# Patient Record
Sex: Female | Born: 1960
Health system: Southern US, Community
[De-identification: ages and names within clinical notes are randomized; demographics above are authoritative.]

## PROBLEM LIST (undated history)

## (undated) DIAGNOSIS — L509 Urticaria, unspecified: Secondary | ICD-10-CM

## (undated) DIAGNOSIS — R0789 Other chest pain: Secondary | ICD-10-CM

## (undated) DIAGNOSIS — K219 Gastro-esophageal reflux disease without esophagitis: Secondary | ICD-10-CM

## (undated) DIAGNOSIS — I10 Essential (primary) hypertension: Secondary | ICD-10-CM

## (undated) DIAGNOSIS — M069 Rheumatoid arthritis, unspecified: Secondary | ICD-10-CM

## (undated) DIAGNOSIS — Z9109 Other allergy status, other than to drugs and biological substances: Secondary | ICD-10-CM

## (undated) DIAGNOSIS — D539 Nutritional anemia, unspecified: Secondary | ICD-10-CM

## (undated) DIAGNOSIS — Z8679 Personal history of other diseases of the circulatory system: Secondary | ICD-10-CM

## (undated) DIAGNOSIS — J329 Chronic sinusitis, unspecified: Secondary | ICD-10-CM

## (undated) DIAGNOSIS — Z8619 Personal history of other infectious and parasitic diseases: Secondary | ICD-10-CM

## (undated) DIAGNOSIS — T7840XA Allergy, unspecified, initial encounter: Secondary | ICD-10-CM

## (undated) DIAGNOSIS — J45909 Unspecified asthma, uncomplicated: Secondary | ICD-10-CM

## (undated) DIAGNOSIS — M810 Age-related osteoporosis without current pathological fracture: Secondary | ICD-10-CM

## (undated) DIAGNOSIS — R7989 Other specified abnormal findings of blood chemistry: Secondary | ICD-10-CM

## (undated) HISTORY — DX: Unspecified asthma, uncomplicated: J45.909

## (undated) HISTORY — DX: Urticaria, unspecified: L50.9

## (undated) HISTORY — DX: Gastro-esophageal reflux disease without esophagitis: K21.9

## (undated) HISTORY — DX: Nutritional anemia, unspecified: D53.9

## (undated) HISTORY — DX: Age-related osteoporosis without current pathological fracture: M81.0

## (undated) HISTORY — DX: Personal history of other diseases of the circulatory system: Z86.79

## (undated) HISTORY — PX: NASAL SINUS SURGERY: SHX719

## (undated) HISTORY — DX: Other allergy status, other than to drugs and biological substances: Z91.09

## (undated) HISTORY — DX: Personal history of other infectious and parasitic diseases: Z86.19

## (undated) HISTORY — DX: Other chest pain: R07.89

## (undated) HISTORY — DX: Allergy, unspecified, initial encounter: T78.40XA

## (undated) HISTORY — DX: Other specified abnormal findings of blood chemistry: R79.89

## (undated) HISTORY — DX: Hypercalcemia: E83.52

---

## 2002-01-07 ENCOUNTER — Other Ambulatory Visit: Admission: RE | Admit: 2002-01-07 | Discharge: 2002-01-07 | Payer: Self-pay | Admitting: Internal Medicine

## 2004-05-03 ENCOUNTER — Ambulatory Visit: Payer: Self-pay | Admitting: Internal Medicine

## 2004-06-23 ENCOUNTER — Ambulatory Visit (HOSPITAL_COMMUNITY): Admission: RE | Admit: 2004-06-23 | Discharge: 2004-06-23 | Payer: Self-pay | Admitting: Family Medicine

## 2004-06-28 ENCOUNTER — Encounter: Admission: RE | Admit: 2004-06-28 | Discharge: 2004-06-28 | Payer: Self-pay | Admitting: Internal Medicine

## 2004-07-01 ENCOUNTER — Emergency Department (HOSPITAL_COMMUNITY): Admission: EM | Admit: 2004-07-01 | Discharge: 2004-07-01 | Payer: Self-pay | Admitting: Emergency Medicine

## 2004-07-02 ENCOUNTER — Ambulatory Visit: Payer: Self-pay | Admitting: Internal Medicine

## 2005-04-05 ENCOUNTER — Ambulatory Visit: Payer: Self-pay | Admitting: Internal Medicine

## 2005-05-03 ENCOUNTER — Emergency Department (HOSPITAL_COMMUNITY): Admission: EM | Admit: 2005-05-03 | Discharge: 2005-05-03 | Payer: Self-pay | Admitting: Family Medicine

## 2005-06-14 ENCOUNTER — Emergency Department (HOSPITAL_COMMUNITY): Admission: EM | Admit: 2005-06-14 | Discharge: 2005-06-14 | Payer: Self-pay | Admitting: Family Medicine

## 2005-08-09 ENCOUNTER — Ambulatory Visit: Payer: Self-pay | Admitting: Hospitalist

## 2005-08-09 ENCOUNTER — Other Ambulatory Visit: Admission: RE | Admit: 2005-08-09 | Discharge: 2005-08-09 | Payer: Self-pay | Admitting: Internal Medicine

## 2005-09-06 ENCOUNTER — Ambulatory Visit (HOSPITAL_COMMUNITY): Admission: RE | Admit: 2005-09-06 | Discharge: 2005-09-06 | Payer: Self-pay | Admitting: Internal Medicine

## 2005-09-06 ENCOUNTER — Ambulatory Visit: Payer: Self-pay | Admitting: Internal Medicine

## 2005-11-01 ENCOUNTER — Ambulatory Visit: Payer: Self-pay | Admitting: Internal Medicine

## 2005-11-02 ENCOUNTER — Ambulatory Visit (HOSPITAL_COMMUNITY): Admission: RE | Admit: 2005-11-02 | Discharge: 2005-11-02 | Payer: Self-pay | Admitting: Internal Medicine

## 2005-12-16 ENCOUNTER — Ambulatory Visit: Payer: Self-pay | Admitting: Internal Medicine

## 2005-12-22 ENCOUNTER — Encounter: Admission: RE | Admit: 2005-12-22 | Discharge: 2005-12-22 | Payer: Self-pay | Admitting: Internal Medicine

## 2006-03-01 DIAGNOSIS — Z8679 Personal history of other diseases of the circulatory system: Secondary | ICD-10-CM

## 2006-03-14 ENCOUNTER — Emergency Department (HOSPITAL_COMMUNITY): Admission: EM | Admit: 2006-03-14 | Discharge: 2006-03-14 | Payer: Self-pay | Admitting: Emergency Medicine

## 2006-04-03 ENCOUNTER — Ambulatory Visit: Payer: Self-pay | Admitting: Internal Medicine

## 2006-04-03 ENCOUNTER — Encounter (INDEPENDENT_AMBULATORY_CARE_PROVIDER_SITE_OTHER): Payer: Self-pay | Admitting: Internal Medicine

## 2006-04-03 LAB — CONVERTED CEMR LAB
Albumin: 4.3 g/dL (ref 3.5–5.2)
HCT: 32.4 % — ABNORMAL LOW (ref 36.0–46.0)
Leukocyte count, blood: 3.9 10*9/L — ABNORMAL LOW (ref 4.0–10.5)
MCHC: 31.5 g/dL (ref 30.0–36.0)
MCV: 71.8 fL — ABNORMAL LOW (ref 78.0–100.0)
Platelets: 234 10*3/uL (ref 150–400)
RDW: 15.6 % — ABNORMAL HIGH (ref 11.5–14.0)
Total Bilirubin: 0.2 mg/dL — ABNORMAL LOW (ref 0.3–1.2)
Total Protein: 7.5 g/dL (ref 6.0–8.3)

## 2006-06-23 DIAGNOSIS — M810 Age-related osteoporosis without current pathological fracture: Secondary | ICD-10-CM | POA: Insufficient documentation

## 2006-06-23 DIAGNOSIS — D649 Anemia, unspecified: Secondary | ICD-10-CM

## 2006-09-19 ENCOUNTER — Ambulatory Visit: Payer: Self-pay | Admitting: Hospitalist

## 2006-09-19 ENCOUNTER — Encounter (INDEPENDENT_AMBULATORY_CARE_PROVIDER_SITE_OTHER): Payer: Self-pay | Admitting: Internal Medicine

## 2006-09-19 DIAGNOSIS — M79609 Pain in unspecified limb: Secondary | ICD-10-CM

## 2006-09-22 ENCOUNTER — Encounter (INDEPENDENT_AMBULATORY_CARE_PROVIDER_SITE_OTHER): Payer: Self-pay | Admitting: Internal Medicine

## 2006-09-22 ENCOUNTER — Ambulatory Visit (HOSPITAL_COMMUNITY): Admission: RE | Admit: 2006-09-22 | Discharge: 2006-09-22 | Payer: Self-pay | Admitting: Internal Medicine

## 2006-09-28 ENCOUNTER — Telehealth: Payer: Self-pay | Admitting: *Deleted

## 2006-10-03 ENCOUNTER — Ambulatory Visit: Payer: Self-pay | Admitting: Hospitalist

## 2006-10-03 ENCOUNTER — Telehealth (INDEPENDENT_AMBULATORY_CARE_PROVIDER_SITE_OTHER): Payer: Self-pay | Admitting: *Deleted

## 2006-10-03 ENCOUNTER — Encounter (INDEPENDENT_AMBULATORY_CARE_PROVIDER_SITE_OTHER): Payer: Self-pay | Admitting: Internal Medicine

## 2006-10-03 LAB — CONVERTED CEMR LAB
Ferritin: 817 ng/mL — ABNORMAL HIGH (ref 10–291)
HDL: 64 mg/dL (ref >39)
Iron: 95 ug/dL (ref 42–145)
LDL Cholesterol: 93 mg/dL (ref 0–99)
Saturation Ratios: 32 % (ref 20–55)
TIBC: 294 ug/dL (ref 250–470)
UIBC: 199 ug/dL
Vitamin B-12: 387 pg/mL (ref 211–911)

## 2006-10-04 ENCOUNTER — Encounter (INDEPENDENT_AMBULATORY_CARE_PROVIDER_SITE_OTHER): Payer: Self-pay | Admitting: Internal Medicine

## 2006-10-09 ENCOUNTER — Telehealth (INDEPENDENT_AMBULATORY_CARE_PROVIDER_SITE_OTHER): Payer: Self-pay | Admitting: *Deleted

## 2006-10-16 ENCOUNTER — Ambulatory Visit: Payer: Self-pay | Admitting: Internal Medicine

## 2006-10-16 ENCOUNTER — Encounter (INDEPENDENT_AMBULATORY_CARE_PROVIDER_SITE_OTHER): Payer: Self-pay | Admitting: Internal Medicine

## 2006-12-01 ENCOUNTER — Ambulatory Visit: Payer: Self-pay | Admitting: Internal Medicine

## 2007-01-04 ENCOUNTER — Telehealth (INDEPENDENT_AMBULATORY_CARE_PROVIDER_SITE_OTHER): Payer: Self-pay | Admitting: *Deleted

## 2007-02-07 ENCOUNTER — Encounter (INDEPENDENT_AMBULATORY_CARE_PROVIDER_SITE_OTHER): Payer: Self-pay | Admitting: Internal Medicine

## 2007-05-17 ENCOUNTER — Ambulatory Visit: Payer: Self-pay | Admitting: Infectious Diseases

## 2007-05-17 DIAGNOSIS — M069 Rheumatoid arthritis, unspecified: Secondary | ICD-10-CM | POA: Insufficient documentation

## 2007-06-25 ENCOUNTER — Telehealth (INDEPENDENT_AMBULATORY_CARE_PROVIDER_SITE_OTHER): Payer: Self-pay | Admitting: Internal Medicine

## 2007-06-28 ENCOUNTER — Encounter (INDEPENDENT_AMBULATORY_CARE_PROVIDER_SITE_OTHER): Payer: Self-pay | Admitting: Internal Medicine

## 2007-06-28 ENCOUNTER — Ambulatory Visit: Payer: Self-pay | Admitting: Internal Medicine

## 2007-06-28 LAB — CONVERTED CEMR LAB
CO2: 23 meq/L (ref 19–32)
Calcium: 9.1 mg/dL (ref 8.4–10.5)
Glucose, Bld: 92 mg/dL (ref 70–99)
MCV: 70.1 fL — ABNORMAL LOW (ref 78.0–100.0)
Platelets: 221 10*3/uL (ref 150–400)
Potassium: 4.1 meq/L (ref 3.5–5.3)
RBC: 4.68 M/uL (ref 3.87–5.11)
Sodium: 139 meq/L (ref 135–145)
WBC: 4.6 10*3/uL (ref 4.0–10.5)

## 2008-02-05 ENCOUNTER — Telehealth (INDEPENDENT_AMBULATORY_CARE_PROVIDER_SITE_OTHER): Payer: Self-pay | Admitting: Internal Medicine

## 2008-04-10 ENCOUNTER — Ambulatory Visit: Payer: Self-pay | Admitting: Internal Medicine

## 2008-04-10 ENCOUNTER — Encounter (INDEPENDENT_AMBULATORY_CARE_PROVIDER_SITE_OTHER): Payer: Self-pay | Admitting: Internal Medicine

## 2008-04-11 LAB — CONVERTED CEMR LAB
BUN: 14 mg/dL (ref 6–23)
CO2: 25 meq/L (ref 19–32)
Calcium: 10 mg/dL (ref 8.4–10.5)
Chloride: 103 meq/L (ref 96–112)
Creatinine, Ser: 0.66 mg/dL (ref 0.40–1.20)
HCT: 32.6 % — ABNORMAL LOW (ref 36.0–46.0)
Hemoglobin: 10.3 g/dL — ABNORMAL LOW (ref 12.0–15.0)
MCV: 70.7 fL — ABNORMAL LOW (ref 78.0–100.0)
RDW: 15.1 % (ref 11.5–15.5)
WBC: 4 10*3/uL (ref 4.0–10.5)

## 2008-06-10 ENCOUNTER — Telehealth: Payer: Self-pay | Admitting: *Deleted

## 2008-06-13 ENCOUNTER — Telehealth: Payer: Self-pay | Admitting: *Deleted

## 2008-06-30 ENCOUNTER — Telehealth: Payer: Self-pay | Admitting: *Deleted

## 2008-07-11 ENCOUNTER — Telehealth (INDEPENDENT_AMBULATORY_CARE_PROVIDER_SITE_OTHER): Payer: Self-pay | Admitting: Internal Medicine

## 2008-07-21 ENCOUNTER — Telehealth: Payer: Self-pay | Admitting: *Deleted

## 2008-07-29 ENCOUNTER — Encounter (INDEPENDENT_AMBULATORY_CARE_PROVIDER_SITE_OTHER): Payer: Self-pay | Admitting: Internal Medicine

## 2008-08-06 ENCOUNTER — Encounter (INDEPENDENT_AMBULATORY_CARE_PROVIDER_SITE_OTHER): Payer: Self-pay | Admitting: Internal Medicine

## 2008-08-06 ENCOUNTER — Ambulatory Visit: Payer: Self-pay | Admitting: *Deleted

## 2008-08-26 ENCOUNTER — Telehealth (INDEPENDENT_AMBULATORY_CARE_PROVIDER_SITE_OTHER): Payer: Self-pay | Admitting: Internal Medicine

## 2008-09-27 ENCOUNTER — Encounter (INDEPENDENT_AMBULATORY_CARE_PROVIDER_SITE_OTHER): Payer: Self-pay | Admitting: Internal Medicine

## 2009-05-15 ENCOUNTER — Ambulatory Visit (HOSPITAL_COMMUNITY): Admission: RE | Admit: 2009-05-15 | Discharge: 2009-05-15 | Payer: Self-pay | Admitting: Infectious Diseases

## 2009-05-15 ENCOUNTER — Ambulatory Visit: Payer: Self-pay | Admitting: Infectious Diseases

## 2009-05-15 DIAGNOSIS — R05 Cough: Secondary | ICD-10-CM

## 2009-05-15 DIAGNOSIS — R059 Cough, unspecified: Secondary | ICD-10-CM | POA: Insufficient documentation

## 2009-09-09 ENCOUNTER — Ambulatory Visit: Payer: Self-pay | Admitting: Internal Medicine

## 2009-09-25 ENCOUNTER — Encounter: Admission: RE | Admit: 2009-09-25 | Discharge: 2009-09-25 | Payer: Self-pay | Admitting: Internal Medicine

## 2009-11-04 ENCOUNTER — Encounter: Payer: Self-pay | Admitting: Internal Medicine

## 2010-05-19 ENCOUNTER — Emergency Department (HOSPITAL_COMMUNITY)
Admission: EM | Admit: 2010-05-19 | Discharge: 2010-05-19 | Payer: Self-pay | Source: Home / Self Care | Admitting: Emergency Medicine

## 2010-05-21 ENCOUNTER — Ambulatory Visit: Payer: Self-pay | Admitting: Internal Medicine

## 2010-05-21 DIAGNOSIS — J984 Other disorders of lung: Secondary | ICD-10-CM

## 2010-05-22 ENCOUNTER — Emergency Department (HOSPITAL_COMMUNITY)
Admission: EM | Admit: 2010-05-22 | Discharge: 2010-05-22 | Payer: Self-pay | Source: Home / Self Care | Admitting: Emergency Medicine

## 2010-06-21 ENCOUNTER — Emergency Department (HOSPITAL_COMMUNITY)
Admission: EM | Admit: 2010-06-21 | Discharge: 2010-06-21 | Payer: Self-pay | Source: Home / Self Care | Admitting: Family Medicine

## 2010-06-23 ENCOUNTER — Emergency Department (HOSPITAL_COMMUNITY)
Admission: EM | Admit: 2010-06-23 | Discharge: 2010-06-23 | Payer: Self-pay | Source: Home / Self Care | Admitting: Emergency Medicine

## 2010-06-27 ENCOUNTER — Encounter: Payer: Self-pay | Admitting: Internal Medicine

## 2010-07-01 ENCOUNTER — Ambulatory Visit: Admission: RE | Admit: 2010-07-01 | Discharge: 2010-07-01 | Payer: Self-pay | Source: Home / Self Care

## 2010-07-01 DIAGNOSIS — J13 Pneumonia due to Streptococcus pneumoniae: Secondary | ICD-10-CM | POA: Insufficient documentation

## 2010-07-08 NOTE — Assessment & Plan Note (Signed)
Summary: ACUTE-URGENT CARE F/U PER [facility] AND DR, KELLER/SEE EMR([place])   Vital Signs:  Patient profile:   50 year old female Height:      61 inches (154.94 cm) Weight:      109.6 pounds (49.82 kg) BMI:     20.78 Temp:     97.1 degrees F (36.17 degrees C) oral Pulse rate:   113 / minute BP sitting:   125 / 87  (left arm)  Vitals Entered By: Stanton Kidney Ditzler RN (July 01, 2010 2:35 PM) Is Patient Diabetic? No Pain Assessment Patient in pain? no      Nutritional Status BMI of 19 -24 = normal Nutritional Status Detail appetite good  Have you ever been in a relationship where you felt threatened, hurt or afraid?denies   Does patient need assistance? Functional Status Self care Ambulation Normal Comments FU from Urgent Care - feeling better. Discuss sinus drainage.   Primary Care Provider:  Darnelle Maffucci MD   History of Present Illness: 50 y/o w with h/o post nasal drip, allerigic rhinits, RA comes for ER follow up Was seen in the ER on 06/23/10 and was found to have a CAP treated with avelox and clarithromycin.  She is doing fine nowe. No fever, CP, cough etc    Depression History:      The patient denies a depressed mood most of the day and a diminished interest in her usual daily activities.         Preventive Screening-Counseling & Management  Alcohol-Tobacco     Smoking Status: never  Caffeine-Diet-Exercise     Does Patient Exercise: yes     Type of exercise: dance with preschoolers     Times/week: 5  Current Medications (verified): 1)  Calcium Citrate-Vitamin D 250-100 Mg-Unit Tabs (Calcium-Vitamin D) .Marland Kitchen.. 1 Tablet Bid 2)  Boniva 150 Mg Tabs (Ibandronate Sodium) .... Take One Tablet A Month 3)  Folic Acid 1 Mg Tabs (Folic Acid) .... Take 1 Tablet By Mouth Once A Day 4)  Prednisone 2.5 Mg  Tabs (Prednisone) .... Take 1 Tablet By Mouth Once A Day 5)  Zantac 300 Mg Tabs (Ranitidine Hcl) .... Once Daily 6)  Fluticasone Propionate 50 Mcg/act Susp (Fluticasone  Propionate) .... 2 Spray in Each Nostril Once A Day 7)  Diclofenac Sodium 75 Mg Tbec (Diclofenac Sodium) .... Take 1 Tablet By Mouth Two Times A Day 8)  Loratadine 10 Mg Tabs (Loratadine) .... Take 1 Tablet By Mouth Once A Day 9)  Cheratussin Ac 100-10 Mg/40ml Syrp (Guaifenesin-Codeine) .... Take 1-2 Tsp Every 6 Hours As Needed For Cough.  Allergies (verified): No Known Drug Allergies  Review of Systems  The patient denies anorexia, fever, weight loss, weight gain, vision loss, decreased hearing, hoarseness, chest pain, syncope, dyspnea on exertion, peripheral edema, prolonged cough, headaches, hemoptysis, abdominal pain, melena, hematochezia, severe indigestion/heartburn, hematuria, incontinence, genital sores, muscle weakness, suspicious skin lesions, transient blindness, difficulty walking, depression, unusual weight change, abnormal bleeding, enlarged lymph nodes, angioedema, breast masses, and testicular masses.    Physical Exam  General:  Gen: VS reveiwed, Alert, well developed, nodistress ENT: mucous membranes pink & moist. No abnormal finds in ear and nose. CVC:S1 S2 , no murmurs, no abnormal heart sounds. Lungs: Clear to auscultation B/L. No wheezes, crackles or other abnormal sounds Abdomen: soft, non distended, no tender. Normal Bowel sounds EXT: no pitting edema, no engorged veins, Pulsations normal  Neuro:alert, oriented *3, cranial nerved 2-12 intact, strenght normal in all  extremities, senstations normal to  light touch.      Impression & Recommendations:  Problem # 1:  COUGH (ICD-786.2)  better wit flonase, will ask her to increase flonase to 2 sprays in each nostril/day will change ranitidine to ppi as she says she will be able to afford it Will refer ENT for managment of chronic cough and chronic sinusitis continue current managemnt  Orders: ENT Referral (ENT)  Problem # 2:  PNEUMONIA, COMMUNITY ACQUIRED, PNEUMOCOCCAL (ICD-481) resolve  The following  medications were removed from the medication list:    Avelox Abc Pack 400 Mg Tabs (Moxifloxacin hcl) .Marland Kitchen... Take 1 tablet by mouth once a day for 10 days  Complete Medication List: 1)  Calcium Citrate-vitamin D 250-100 Mg-unit Tabs (Calcium-vitamin d) .Marland Kitchen.. 1 tablet bid 2)  Boniva 150 Mg Tabs (Ibandronate sodium) .... Take one tablet a month 3)  Folic Acid 1 Mg Tabs (Folic acid) .... Take 1 tablet by mouth once a day 4)  Prednisone 2.5 Mg Tabs (Prednisone) .... Take 1 tablet by mouth once a day 5)  Zantac 300 Mg Tabs (Ranitidine hcl) .... Once daily 6)  Fluticasone Propionate 50 Mcg/act Susp (Fluticasone propionate) .... 2 spray in each nostril once a day 7)  Diclofenac Sodium 75 Mg Tbec (Diclofenac sodium) .... Take 1 tablet by mouth two times a day 8)  Loratadine 10 Mg Tabs (Loratadine) .... Take 1 tablet by mouth once a day 9)  Cheratussin Ac 100-10 Mg/43ml Syrp (Guaifenesin-codeine) .... Take 1-2 tsp every 6 hours as needed for cough. 10)  Pantoprazole Sodium 40 Mg Tbec (Pantoprazole sodium) .... Take 1 tablet by mouth once a day 11)  Ventolin Hfa 108 (90 Base) Mcg/act Aers (Albuterol sulfate) .Marland Kitchen.. 1-2 puffs as needed for shortness of breath  Other Orders: Pneumococcal Vaccine (81191) Admin 1st Vaccine (47829)  Patient Instructions: 1)  Please schedule a follow-up appointment in 2 months. Prescriptions: PANTOPRAZOLE SODIUM 40 MG TBEC (PANTOPRAZOLE SODIUM) Take 1 tablet by mouth once a day  #31 x 3   Entered and Authorized by:   Bethel Born MD   Signed by:   Bethel Born MD on 07/01/2010   Method used:   Electronically to        [facility] [address] (retail)       [phone]       [place], [place]  [address]       Ph: [phone]       Fax: [phone]   RxID:   [id number]    Orders Added: 1)  Est. Patient Level IV [95638] 2)  Pneumococcal Vaccine [90732] 3)  Admin 1st Vaccine [90471] 4)  ENT Referral [ENT]   Immunizations  Administered:  Pneumonia Vaccine:    Vaccine Type: Pneumovax    Site: right deltoid    Mfr: Merck    Dose: 0.5 ml    Route: IM    Given by: Stanton Kidney Ditzler RN    Exp. Date: 10/29/2011    Lot #: [id number]    VIS given: 05/11/09 version given July 01, 2010.   Immunizations Administered:  Pneumonia Vaccine:    Vaccine Type: Pneumovax    Site: right deltoid    Mfr: Merck    Dose: 0.5 ml    Route: IM    Given by: Stanton Kidney Ditzler RN    Exp. Date: 10/29/2011    Lot #: [id number]    VIS given: 05/11/09 version given July 01, 2010.   Prevention & Chronic  Care Immunizations   Influenza vaccine: Fluvax Non-MCR  (05/21/2010)    Tetanus booster: 05/15/2009: Td    Pneumococcal vaccine: Pneumovax  (07/01/2010)   Pneumococcal vaccine deferral: Deferred  (05/15/2009)  Colorectal Screening   Hemoccult: Not documented   Hemoccult action/deferral: Ordered  (05/15/2009)    Colonoscopy: Not documented   Colonoscopy action/deferral: GI referral  (05/15/2009)  Other Screening   Pap smear: Not documented   Pap smear action/deferral: Deferred  (05/21/2010)    Mammogram: ASSESSMENT: Negative - BI-RADS 1^MM DIGITAL SCREENING  (09/25/2009)   Mammogram action/deferral: Ordered  (05/15/2009)   Smoking status: never  (07/01/2010)  Lipids   Total Cholesterol: 173  (10/03/2006)   LDL: 93  (10/03/2006)   LDL Direct: Not documented   HDL: 64  (10/03/2006)   Triglycerides: 82  (10/03/2006)      Resource handout printed.   Nursing Instructions: Give Pneumovax today

## 2010-07-08 NOTE — Assessment & Plan Note (Signed)
Summary: EST-CK/FU/MEDS/CFB   Vital Signs:  Patient profile:   50 year old female Height:      61 inches (154.94 cm) Weight:      110.3 pounds (50.14 kg) BMI:     20.92 Temp:     97.9 degrees F (36.61 degrees C) oral Pulse rate:   99 / minute BP sitting:   130 / 87  (right arm)  Vitals Entered By: Cynda Familia Duncan Dull) (September 09, 2009 3:05 PM) CC: pt missed mammo and gi appts, would like for [place] to r/s these, medication refill Is Patient Diabetic? No Nutritional Status BMI of 19 -24 = normal  Have you ever been in a relationship where you felt threatened, hurt or afraid?No   Does patient need assistance? Functional Status Self care Ambulation Normal   Primary Care Provider:  Darnelle Maffucci MD  CC:  pt missed mammo and gi appts, would like for [place] to r/s these, and medication refill.  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   Her Rheumatologist is at [facility], which she last saw 09/2008. She has next appointment at [facility] 12/2009 to restart her Methotrexate which she has been off for >1year.  About her arthritis, she has recently restarted prednisone 2.5 mg once daily herself because the pain has been getting worse.She has also been taking increasing amount of tylenol and ibuprofen. She has not seen her rheumatologist sinc e 09/2008.  Patient denies fever, chills, CP, SOB, N,V,C,D. Otherwise doing well, and denies any other complaints.     Preventive Screening-Counseling & Management  Alcohol-Tobacco     Smoking Status: never  Current Medications (verified): 1)  Calcium Citrate-Vitamin D 250-100 Mg-Unit Tabs (Calcium-Vitamin D) .Marland Kitchen.. 1 Tablet Bid 2)  Boniva 150 Mg Tabs (Ibandronate Sodium) .... Take One Tablet A Month 3)  Folic Acid 1 Mg Tabs (Folic Acid) .... Take 1 Tablet By Mouth Once A Day 4)  Vicodin 5-500 Mg Tabs (Hydrocodone-Acetaminophen)  .... Take One Tab By Mouth Once Every 6 Hours As Needed For Pain 5)  Prednisone 2.5 Mg  Tabs (Prednisone) .... Take 1 Tablet By Mouth Once A Day 6)  Zantac 300 Mg Tabs (Ranitidine Hcl) .... Once Daily 7)  Doxycycline Hyclate 100 Mg Caps (Doxycycline Hyclate) .... Twice A Day For 7 Days 8)  Fluticasone Propionate 50 Mcg/act Susp (Fluticasone Propionate) .Marland Kitchen.. 1 Spray in Each Nostril Once A Day 9)  Diclofenac Sodium 75 Mg Tbec (Diclofenac Sodium) .... Take 1 Tablet By Mouth Two Times A Day 10)  Loratadine 10 Mg Tabs (Loratadine) .... Take 1 Tablet By Mouth Once A Day  Allergies (verified): No Known Drug Allergies  Review of Systems       .Per HPI  Physical Exam  General:  alert, well-developed, and cooperative to examination.    Lungs:  normal respiratory effort, no accessory muscle use, normal breath sounds, no crackles, and no wheezes.  Heart:  normal rate, regular rhythm, no murmur, no gallop, and no rub.    Abdomen:  soft, non-tender, normal bowel sounds, no distention, no guarding, no rebound tenderness, no hepatomegaly, and no splenomegaly.    Msk:  no joint swelling, no joint warmth, and no redness over joints.  Extremities:  No cyanosis, clubbing, edema  Neurologic:  alert & oriented X3, cranial nerves II-XII intact, strength normal in all extremities, sensation intact to light touch, and gait  normal.     Skin:   turgor normal and no rashes.  Psych:  Oriented X3, memory intact for recent and remote, normally interactive, good eye contact, not anxious appearing, and not depressed appearing.    Impression & Recommendations:  Problem # 1:  ARTHRITIS, RHEUMATOID (ICD-714.0) Assessment Unchanged since age 39, well controlled, on prednisone. she will see her rheumatologist at [facility] 12/2009, For now will give diclofenac for further relief of her RA (as it has a higher affinity to joints that other NSAIDS)   Problem # 2:  COUGH (ICD-786.2) 2/2 to post nasal drip, will give loratidine.     Problem # 3:  Preventive Health Care (ICD-V70.0) Will schedule for mamogram and gi for colonoscopy.   Complete Medication List: 1)  Calcium Citrate-vitamin D 250-100 Mg-unit Tabs (Calcium-vitamin d) .Marland Kitchen.. 1 tablet bid 2)  Boniva 150 Mg Tabs (Ibandronate sodium) .... Take one tablet a month 3)  Folic Acid 1 Mg Tabs (Folic acid) .... Take 1 tablet by mouth once a day 4)  Vicodin 5-500 Mg Tabs (Hydrocodone-acetaminophen) .... Take one tab by mouth once every 6 hours as needed for pain 5)  Prednisone 2.5 Mg Tabs (Prednisone) .... Take 1 tablet by mouth once a day 6)  Zantac 300 Mg Tabs (Ranitidine hcl) .... Once daily 7)  Doxycycline Hyclate 100 Mg Caps (Doxycycline hyclate) .... Twice a day for 7 days 8)  Fluticasone Propionate 50 Mcg/act Susp (Fluticasone propionate) .Marland Kitchen.. 1 spray in each nostril once a day 9)  Diclofenac Sodium 75 Mg Tbec (Diclofenac sodium) .... Take 1 tablet by mouth two times a day 10)  Loratadine 10 Mg Tabs (Loratadine) .... Take 1 tablet by mouth once a day  Patient Instructions: 1)  Please schedule a follow-up appointment in 6 months. Prescriptions: LORATADINE 10 MG TABS (LORATADINE) Take 1 tablet by mouth once a day  #30 x 3   Entered and Authorized by:   Darnelle Maffucci MD   Signed by:   Darnelle Maffucci MD on 09/09/2009   Method used:   Electronically to        [facility] [address] (retail)       [phone]       [place], [place]  [address]       Ph: [phone]       Fax: [phone]   RxID:   [id number] DICLOFENAC SODIUM 75 MG TBEC (DICLOFENAC SODIUM) Take 1 tablet by mouth two times a day  #60 x 3   Entered and Authorized by:   Darnelle Maffucci MD   Signed by:   Darnelle Maffucci MD on 09/09/2009   Method used:   Print then Give to Patient   RxID:   [id number] VICODIN 5-500 MG TABS (HYDROCODONE-ACETAMINOPHEN) Take one tab by mouth once every 6 hours as needed for pain  #15 x 0   Entered and Authorized by:    Darnelle Maffucci MD   Signed by:   Darnelle Maffucci MD on 09/09/2009   Method used:   Print then Give to Patient   RxID:   [id number] PREDNISONE 2.5 MG  TABS (PREDNISONE) Take 1 tablet by mouth once a day  #30 Tablet x 3   Entered and Authorized by:   Darnelle Maffucci MD   Signed by:   Darnelle Maffucci MD on 09/09/2009   Method used:   Electronically to        [facility] [address] (retail)  [address]       [place], [place]  [address]       Ph: [phone]       Fax: [phone]   RxID:   [id number] BONIVA 150 MG TABS (IBANDRONATE SODIUM) Take one tablet a month  #1 x 12   Entered and Authorized by:   Darnelle Maffucci MD   Signed by:   Darnelle Maffucci MD on 09/09/2009   Method used:   Electronically to        [facility] [address] (retail)       [phone]       [place], [place]  [address]       Ph: [phone]       Fax: [phone]   RxID:   [id number]    Prevention & Chronic Care Immunizations   Influenza vaccine: Fluvax Non-MCR  (05/15/2009)    Tetanus booster: 05/15/2009: Td    Pneumococcal vaccine: Not documented   Pneumococcal vaccine deferral: Deferred  (05/15/2009)  Colorectal Screening   Hemoccult: Not documented   Hemoccult action/deferral: Ordered  (05/15/2009)    Colonoscopy: Not documented   Colonoscopy action/deferral: GI referral  (05/15/2009)  Other Screening   Pap smear: Not documented   Pap smear action/deferral: Deferred  (05/15/2009)    Mammogram: Not documented   Mammogram action/deferral: Ordered  (05/15/2009)   Smoking status: never  (09/09/2009)  Lipids   Total Cholesterol: 173  (10/03/2006)   LDL: 93  (10/03/2006)   LDL Direct: Not documented   HDL: 64  (10/03/2006)   Triglycerides: 82  (10/03/2006)

## 2010-07-08 NOTE — Assessment & Plan Note (Signed)
Summary: ER/FU/ SB.   Vital Signs:  Patient profile:   50 year old female Height:      61 inches (154.94 cm) Weight:      115.01 pounds (52.28 kg) BMI:     21.81 Temp:     98.2 degrees F (36.78 degrees C) oral Pulse rate:   118 / minute BP sitting:   140 / 87  (right arm)  Vitals Entered By: Angelina Ok RN (May 21, 2010 1:32 PM) CC: Depression Is Patient Diabetic? No Pain Assessment Patient in pain? no      Nutritional Status BMI of 19 -24 = normal  Have you ever been in a relationship where you felt threatened, hurt or afraid?No   Does patient need assistance? Functional Status Self care Ambulation Normal Comments Follow up appointment.  Had Pneumonia.  ER visit.  Coughing up white phlegm.  Cough medication not working.  Problems sleepin due to cough.  Needs refill on Nasal spray.  On meds for Arthritis.bPain in left thigh.   Primary Care Provider:  Darnelle Maffucci MD  CC:  Depression.  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  Patient went to the ED 05/19/2010 where she was evaluated for a cough. Based on CURB 65 score, CXR and CT angio, patient's cough was secondary to CAP and she was discharged on Avelox for 10 days. She comes to clinic today for follow up. She states she is feeling better, but still has a cough which is keeping her up at night.   She denies chest pain, sore throat, new fevers, chills, abdominal complaints, changes in urinary and bowel habits.     Depression History:      The patient denies a depressed mood most of the day and a diminished interest in her usual daily activities.         Preventive Screening-Counseling & Management  Alcohol-Tobacco     Smoking Status: never  Current Medications (verified): 1)  Calcium Citrate-Vitamin D 250-100 Mg-Unit Tabs (Calcium-Vitamin D) .Marland Kitchen.. 1 Tablet Bid 2)   Boniva 150 Mg Tabs (Ibandronate Sodium) .... Take One Tablet A Month 3)  Folic Acid 1 Mg Tabs (Folic Acid) .... Take 1 Tablet By Mouth Once A Day 4)  Vicodin 5-500 Mg Tabs (Hydrocodone-Acetaminophen) .... Take One Tab By Mouth Once Every 6 Hours As Needed For Pain 5)  Prednisone 2.5 Mg  Tabs (Prednisone) .... Take 1 Tablet By Mouth Once A Day 6)  Zantac 300 Mg Tabs (Ranitidine Hcl) .... Once Daily 7)  Fluticasone Propionate 50 Mcg/act Susp (Fluticasone Propionate) .Marland Kitchen.. 1 Spray in Each Nostril Once A Day 8)  Diclofenac Sodium 75 Mg Tbec (Diclofenac Sodium) .... Take 1 Tablet By Mouth Two Times A Day 9)  Loratadine 10 Mg Tabs (Loratadine) .... Take 1 Tablet By Mouth Once A Day 10)  Cheratussin Ac 100-10 Mg/8ml Syrp (Guaifenesin-Codeine) .... Take 1-2 Tsp Every 6 Hours As Needed For Cough. 11)  Avelox Abc Pack 400 Mg Tabs (Moxifloxacin Hcl) .... 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  Risk Factors: Exercise: yes (05/15/2009)  Risk Factors: Smoking Status: never (05/21/2010)  Review of Systems      See HPI  Physical Exam  General:  alert and well-developed.  VS reviewed and wnl Head:  normocephalic and atraumatic.   Eyes:  vision grossly intact, pupils equal, pupils round, and pupils reactive to light.   Mouth:  pharynx pink and moist and no erythema.   Neck:  supple.   Lungs:  normal respiratory effort and normal breath sounds.   Heart:  normal rate  and regular rhythm.   Abdomen:  soft and non-tender.   Msk:  normal ROM and no joint tenderness.   Neurologic:  nonfocal    Impression & Recommendations:  Problem # 1:  COUGH (ICD-786.2) Assessment Improved Secondary to CAP. Pt has been on abx (avelox) for 1 day. No apparent signs of worsening PNA. Will advise patient to continue abx, and will give flu shot today, along with cheratussin for cough.  Problem # 2:  ARTHRITIS, RHEUMATOID (ICD-714.0) Assessment: Comment Only  On chronic prednisone therapy. Pt previously followed at [facility], but due to distance, she would like something closer. [facility] not candidate, since she had some no shows, and has an outstanding balance with them. Will refer to [facility].   Orders: Rheumatology Referral (Rheumatology)  Problem # 3:  LUNG NODULE (ICD-518.89) CT angio done 05/19/2010 revealed 7 mm nodule in right lung base, along with 5 mm nodule in right middle lobe. No prior CT to compare to. Based on consolidation, malignancy cannot be excluded, but the opacity that was found on CT was more likely related to PNA. Pt was advised to have a repeat study in 6 months for follow up.   Complete Medication List: 1)  Calcium Citrate-vitamin D 250-100 Mg-unit Tabs (Calcium-vitamin d) .Marland Kitchen.. 1 tablet bid 2)  Boniva 150 Mg Tabs (Ibandronate sodium) .... Take one tablet a month 3)  Folic Acid 1 Mg Tabs (Folic acid) .... Take 1 tablet by mouth once a day 4)  Vicodin 5-500 Mg Tabs (Hydrocodone-acetaminophen) .... Take one tab by mouth once every 6 hours as needed for pain 5)  Prednisone 2.5 Mg Tabs (Prednisone) .... Take 1 tablet by mouth once a day 6)  Zantac 300 Mg Tabs (Ranitidine hcl) .... Once daily 7)  Fluticasone Propionate 50 Mcg/act Susp (Fluticasone propionate) .Marland Kitchen.. 1 spray in each nostril once a day 8)  Diclofenac Sodium 75 Mg Tbec (Diclofenac sodium) .... Take 1 tablet by mouth two times a day 9)  Loratadine 10 Mg Tabs (Loratadine) .... Take 1 tablet  by mouth once a day 10)  Cheratussin Ac 100-10 Mg/74ml Syrp (Guaifenesin-codeine) .... Take 1-2 tsp every 6 hours as needed for cough. 11)  Avelox Abc Pack 400 Mg Tabs (Moxifloxacin hcl) .... Take 1 tablet by mouth once a day for 10 days  Patient Instructions: 1)  Please follow up with your primary care physician as needed. 2)  Please keep in mind, that you are due for a PAP smear. Please schedule one as soon as you can. 3)  Please follow up with Rheumatology as needed. A referral will be made for you.  Prescriptions: CHERATUSSIN AC 100-10 MG/5ML SYRP (GUAIFENESIN-CODEINE) Take 1-2 tsp every 6 hours as needed for cough.  #1 bottle x 0   Entered and Authorized by:   Melida Quitter MD   Signed  by:   Melida Quitter MD on 05/21/2010   Method used:   Print then Give to Patient   RxID:   [id number] VICODIN 5-500 MG TABS (HYDROCODONE-ACETAMINOPHEN) Take one tab by mouth once every 6 hours as needed for pain  #15 x 0   Entered and Authorized by:   Melida Quitter MD   Signed by:   Melida Quitter MD on 05/21/2010   Method used:   Print then Give to Patient   RxID:   [id number] LORATADINE 10 MG TABS (LORATADINE) Take 1 tablet by mouth once a day  #30 x 3   Entered and Authorized by:   Melida Quitter MD   Signed by:   Melida Quitter MD on 05/21/2010   Method used:   Electronically to        [facility] [address] (retail)       [phone]       [place], [place]  [address]       Ph: [phone]       Fax: [phone]   RxID:   [id number] DICLOFENAC SODIUM 75 MG TBEC (DICLOFENAC SODIUM) Take 1 tablet by mouth two times a day  #60 x 3   Entered and Authorized by:   Melida Quitter MD   Signed by:   Melida Quitter MD on 05/21/2010   Method used:   Electronically to        [facility] [address] (retail)       [phone]       [place], [place]  [address]       Ph: [phone]       Fax: [phone]   RxID:    [id number] FLUTICASONE PROPIONATE 50 MCG/ACT SUSP (FLUTICASONE PROPIONATE) 1 SPRAY IN EACH NOSTRIL ONCE A DAY  #1 x 6   Entered and Authorized by:   Melida Quitter MD   Signed by:   Melida Quitter MD on 05/21/2010   Method used:   Electronically to        [facility] [address] (retail)       [phone]       [place], [place]  [address]       Ph: [phone]       Fax: [phone]   RxID:   [id number] ZANTAC 300 MG TABS (RANITIDINE HCL) once daily  #30 x 6   Entered and Authorized by:   Melida Quitter MD   Signed by:   Melida Quitter MD on 05/21/2010   Method used:   Electronically to        [facility] [address] (retail)       [phone]       [place], [place]  [address]       Ph: [phone]       Fax: [phone]   RxID:   [id number] PREDNISONE 2.5 MG  TABS (PREDNISONE) Take 1 tablet by mouth once a day  #30 Tablet x 3   Entered and Authorized by:   Melida Quitter MD   Signed by:   Melida Quitter MD on 05/21/2010   Method used:   Electronically to        [facility] [address] (retail)       [phone]  [place], [place]  [address]       Ph: [phone]       Fax: [phone]   RxID:   [id number]    Orders Added: 1)  Rheumatology Referral [Rheumatology] 2)  Est. Patient Level III [[address]]     Vital Signs:  Patient profile:   50 year old female Height:      61 inches (154.94 cm) Weight:      115.01 pounds (52.28 kg) BMI:     21.81 Temp:     98.2 degrees F (36.78 degrees C) oral Pulse rate:   118 / minute BP sitting:   140 / 87  (right arm)  Vitals Entered By: Angelina Ok RN (May 21, 2010 1:32 PM)   Prevention & Chronic Care Immunizations   Influenza vaccine: Fluvax Non-MCR  (05/15/2009)    Tetanus booster: 05/15/2009: Td    Pneumococcal vaccine: Not documented   Pneumococcal vaccine deferral: Deferred   (05/15/2009)  Colorectal Screening   Hemoccult: Not documented   Hemoccult action/deferral: Ordered  (05/15/2009)    Colonoscopy: Not documented   Colonoscopy action/deferral: GI referral  (05/15/2009)  Other Screening   Pap smear: Not documented   Pap smear action/deferral: Deferred  (05/21/2010)    Mammogram: ASSESSMENT: Negative - BI-RADS 1^MM DIGITAL SCREENING  (09/25/2009)   Mammogram action/deferral: Ordered  (05/15/2009)   Smoking status: never  (05/21/2010)  Lipids   Total Cholesterol: 173  (10/03/2006)   LDL: 93  (10/03/2006)   LDL Direct: Not documented   HDL: 64  (10/03/2006)   Triglycerides: 82  (10/03/2006)   Nursing Instructions: Give Flu vaccine today     Appended Document: ER/FU/ SB.   Immunizations Administered:  Influenza Vaccine # 1:    Vaccine Type: Fluvax Non-MCR    Site: left deltoid    Mfr: GlaxoSmithKline    Dose: 0.5 ml    Route: IM    Given by: Angelina Ok RN    Exp. Date: 12/04/2010    Lot #: [id number]    VIS given: 12/29/09 version given May 21, 2010.  Flu Vaccine Consent Questions:    Do you have a history of severe allergic reactions to this vaccine? no    Any prior history of allergic reactions to egg and/or gelatin? no    Do you have a sensitivity to the preservative Thimersol? no    Do you have a past history of Guillan-Barre Syndrome? no    Do you currently have an acute febrile illness? no    Have you ever had a severe reaction to latex? no    Vaccine information given and explained to patient? yes    Are you currently pregnant? no

## 2010-07-08 NOTE — Letter (Signed)
Summary: [facility]  [facility]   Imported By: Margie Billet 11/05/2009 13:51:23  _____________________________________________________________________  External Attachment:    Type:   Image     Comment:   External Document

## 2010-08-03 ENCOUNTER — Emergency Department (HOSPITAL_COMMUNITY)
Admission: EM | Admit: 2010-08-03 | Discharge: 2010-08-03 | Disposition: A | Discharge status: Home / Self Care | Payer: BC Managed Care – PPO | Attending: Emergency Medicine | Admitting: Emergency Medicine

## 2010-08-03 ENCOUNTER — Inpatient Hospital Stay (INDEPENDENT_AMBULATORY_CARE_PROVIDER_SITE_OTHER)
Admission: RE | Admit: 2010-08-03 | Discharge: 2010-08-03 | Disposition: A | Discharge status: Home / Self Care | Payer: BC Managed Care – PPO | Source: Ambulatory Visit | Attending: Emergency Medicine | Admitting: Emergency Medicine

## 2010-08-03 ENCOUNTER — Telehealth: Payer: Self-pay | Admitting: Internal Medicine

## 2010-08-03 ENCOUNTER — Ambulatory Visit (INDEPENDENT_AMBULATORY_CARE_PROVIDER_SITE_OTHER): Payer: BC Managed Care – PPO

## 2010-08-03 DIAGNOSIS — R059 Cough, unspecified: Secondary | ICD-10-CM | POA: Insufficient documentation

## 2010-08-03 DIAGNOSIS — Z79899 Other long term (current) drug therapy: Secondary | ICD-10-CM | POA: Insufficient documentation

## 2010-08-03 DIAGNOSIS — M069 Rheumatoid arthritis, unspecified: Secondary | ICD-10-CM | POA: Insufficient documentation

## 2010-08-03 DIAGNOSIS — J189 Pneumonia, unspecified organism: Secondary | ICD-10-CM

## 2010-08-03 DIAGNOSIS — R05 Cough: Secondary | ICD-10-CM | POA: Insufficient documentation

## 2010-08-03 DIAGNOSIS — R9389 Abnormal findings on diagnostic imaging of other specified body structures: Secondary | ICD-10-CM | POA: Insufficient documentation

## 2010-08-03 LAB — BASIC METABOLIC PANEL
BUN: 6 mg/dL (ref 6–23)
Calcium: 9.8 mg/dL (ref 8.4–10.5)
Creatinine, Ser: 0.59 mg/dL (ref 0.4–1.2)
GFR calc non Af Amer: >60 mL/min (ref >60)
Glucose, Bld: 84 mg/dL (ref 70–99)
Potassium: 4.1 mEq/L (ref 3.5–5.1)

## 2010-08-03 LAB — DIFFERENTIAL
Basophils Relative: 1 % (ref 0–1)
Eosinophils Absolute: 0.2 10*3/uL (ref 0.0–0.7)
Eosinophils Relative: 4 % (ref 0–5)
Lymphs Abs: 0.7 10*3/uL (ref 0.7–4.0)
Monocytes Absolute: 0.5 10*3/uL (ref 0.1–1.0)
Neutro Abs: 3.4 10*3/uL (ref 1.7–7.7)

## 2010-08-03 LAB — CBC
HCT: 28.4 % — ABNORMAL LOW (ref 36.0–46.0)
MCH: 22.2 pg — ABNORMAL LOW (ref 26.0–34.0)
MCHC: 33.1 g/dL (ref 30.0–36.0)
MCV: 67 fL — ABNORMAL LOW (ref 78.0–100.0)
RDW: 15.7 % — ABNORMAL HIGH (ref 11.5–15.5)

## 2010-08-03 NOTE — Telephone Encounter (Signed)
Patient presented to the ED with complaints of cough and URI symptoms, patients CXR shows unresolved infiltrate from prior treated pneumonia a month ago. Last CT angio in 05/2009 in [place] shows finding consistent with malignancy?  ED MD would like [place] to work this up further to make sure patient does not have lung cancer. I informed the patient to the clinic on 2/29 at 9 am, and that she will be worked into the clinic if possible.

## 2010-08-04 ENCOUNTER — Ambulatory Visit (INDEPENDENT_AMBULATORY_CARE_PROVIDER_SITE_OTHER): Payer: BC Managed Care – PPO | Admitting: Internal Medicine

## 2010-08-04 VITALS — BP 120/79 | HR 91 | Temp 97.4°F | Ht 62.0 in | Wt 110.2 lb

## 2010-08-04 DIAGNOSIS — R9389 Abnormal findings on diagnostic imaging of other specified body structures: Secondary | ICD-10-CM

## 2010-08-04 DIAGNOSIS — R05 Cough: Secondary | ICD-10-CM

## 2010-08-04 DIAGNOSIS — R059 Cough, unspecified: Secondary | ICD-10-CM

## 2010-08-04 MED ORDER — HYDROCOD POLST-CHLORPHEN POLST 10-8 MG/5ML PO LQCR: 5.0000 mL | Freq: Two times a day (BID) | ORAL | Status: DC | PRN

## 2010-08-04 NOTE — Patient Instructions (Signed)
Your xray findings are a bit concerning and we at this point cannot give you a definitive diagnosis, so we will refer you to a pulmonologist, which is a lung specialist for more specialized evaluation. In the meantime, please let us know if you have any questions or concerns.

## 2010-08-04 NOTE — Progress Notes (Signed)
  Subjective:    Patient ID: Jamie Cox, female    DOB: [birth year], 50 y.o.   MRN: [id number]  HPI  Patient is a 50 year old female here on emergency room follow up due to concerning chest x-ray findings.  A chest  CT angiogram done in the emergency room on May 19, 2010 revealed prominent areas of airspace consolidation in the periphery of the right middle and lower lung lobes that was suspicious for multifocal pneumonia. However, this consolidation also had a mass- like appearance, therefore, an underlying mass could not be excluded and the recommendation was to treat the acute pneumonia in order to delineate whether or not there should be concern for a cancerous process. Other significant findings at the time was also a 7 mm nodule in the right lung base and a 5 mm nodule in the right middle lobe, mild lymphadenopathy at the azygoesophageal recess and subcarinal region as well as small right hilar lymph nodes.   Prior to the December 14 emergency room visit, the patient reports she'd been having a one month history of dry coughing and feeling generally weak, then went to the ED, and was diagnosed with multifocal PNA as noted above and discharged on antibiotics. Patient states that she did recover from the pneumonia after a 10 day course of Avelox however the cough remained persistent, with increased postnasal drip. However, she denied any fever, chills, shortness of breath, sweats or hemoptysis.  Then the patient went into the ED again on August 03, 2010 for evaluation of the persistent coughing, and a chest x-ray showed persistent right middle lobe and right lower lobe airspace disease as well as slight worsening of the right lower lobe. There was also concern for basilar interstitial lung disease.   Besides the persistent cough, she states to me today that she feels generally well and is currently in no acute distress and is only concerned about the radiographic findings as mentioned above.    Review of Systems  Constitutional: Negative for fever and chills.  Respiratory: Positive for cough. Negative for shortness of breath and wheezing.   Cardiovascular: Negative for chest pain and palpitations.  Gastrointestinal: Negative for nausea and vomiting.  Genitourinary: Negative for dysuria.  Neurological: Negative for weakness.       Objective:   Physical Exam  Constitutional: She is oriented to person, place, and time. She appears well-developed and well-nourished. No distress.  Cardiovascular: Normal rate, regular rhythm and normal heart sounds.  Exam reveals no gallop and no friction rub.   No murmur heard. Pulmonary/Chest: Effort normal and breath sounds normal. No respiratory distress. She has no wheezes. She has no rales.  Abdominal: Soft. Bowel sounds are normal. There is no tenderness.  Musculoskeletal: Normal range of motion.  Neurological: She is alert and oriented to person, place, and time.  Psychiatric: She has a normal mood and affect.          Assessment & Plan:

## 2010-08-04 NOTE — Assessment & Plan Note (Addendum)
Chest x-ray from August 03 2010 was concerning for persistent right middle lobe and right lower lobe airspace disease with slight improvement of the right middle lobe and slight worsening of the right lower lobe as compared to the chest radiograph of June 21, 2010,  as well as evidence of basilar interstitial lung disease.  Etiology of all these findings is unclear at this point,  Again it could represent persistent pneumonia vs TB?-> although unlikely in the absence of fever chills sweats hemoptysis or any other systemic symptoms. However, there is worsening of the right lower lobe airspace disease even after abx treatment, and at this point we feel that patient would benefit most from evaluation by a pulmonologist,  as they will better be able to determine whether the patient needs to be followed with repeat imaging versus biopsy.  I will make a pulmonology referral today and the patient will be contacted for an appointment date and time by the clinic.

## 2010-08-04 NOTE — Assessment & Plan Note (Signed)
The differential for the patient's persistent cough is very wide at this point ( especially in the setting of her recent radiographic findings).  However she does have postnasal drip which could explain her chronic cough.  For this will give a prescription for Tussionex for relief of symptoms.

## 2010-08-10 ENCOUNTER — Institutional Professional Consult (permissible substitution) (INDEPENDENT_AMBULATORY_CARE_PROVIDER_SITE_OTHER): Payer: BC Managed Care – PPO | Admitting: Internal Medicine

## 2010-08-10 ENCOUNTER — Encounter: Payer: Self-pay | Admitting: Internal Medicine

## 2010-08-10 DIAGNOSIS — M069 Rheumatoid arthritis, unspecified: Secondary | ICD-10-CM

## 2010-08-13 ENCOUNTER — Telehealth (INDEPENDENT_AMBULATORY_CARE_PROVIDER_SITE_OTHER): Payer: Self-pay | Admitting: *Deleted

## 2010-08-16 LAB — POCT CARDIAC MARKERS
Myoglobin, poc: 121 ng/mL (ref 12–200)
Troponin i, poc: <0.05 ng/mL (ref 0.00–0.09)

## 2010-08-16 LAB — DIFFERENTIAL
Eosinophils Absolute: 0 10*3/uL (ref 0.0–0.7)
Lymphs Abs: 1.2 10*3/uL (ref 0.7–4.0)
Neutro Abs: 8.4 10*3/uL — ABNORMAL HIGH (ref 1.7–7.7)
Neutrophils Relative %: 77 % (ref 43–77)

## 2010-08-16 LAB — BASIC METABOLIC PANEL
BUN: 10 mg/dL (ref 6–23)
Calcium: 10.1 mg/dL (ref 8.4–10.5)
Creatinine, Ser: 0.73 mg/dL (ref 0.4–1.2)
GFR calc non Af Amer: >60 mL/min (ref >60)
Glucose, Bld: 112 mg/dL — ABNORMAL HIGH (ref 70–99)
Potassium: 3.9 mEq/L (ref 3.5–5.1)

## 2010-08-16 LAB — CBC
HCT: 30 % — ABNORMAL LOW (ref 36.0–46.0)
MCHC: 31.3 g/dL (ref 30.0–36.0)
Platelets: 185 10*3/uL (ref 150–400)
RDW: 14.9 % (ref 11.5–15.5)
WBC: 9.7 10*3/uL (ref 4.0–10.5)

## 2010-08-16 LAB — D-DIMER, QUANTITATIVE: D-Dimer, Quant: 1.98 ug/mL-FEU — ABNORMAL HIGH (ref 0.00–0.48)

## 2010-08-17 NOTE — Letter (Signed)
Summary: [employer]  [address]   [place], [place] [address]   Phone: [phone]  Fax: [phone]    Today's Date: August 10, 2010  Name of Patient: Jamie Cox  The above named patient had a medical visit today at: 3:00 pm.  Please take this into consideration when reviewing the time away from work/school.    Special Instructions:  [ x ] None  [  ] To be off the remainder of today, returning to the normal work / school schedule tomorrow.  [  ] To be off until the next scheduled appointment on ______________________.  [  ] Other ________________________________________________________________ ________________________________________________________________________   Sincerely yours,     Sandrea Hughs, MD

## 2010-08-17 NOTE — Assessment & Plan Note (Signed)
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 Darnelle Maffucci Primary Provider/Referring Provider:  Darnelle Maffucci MD  CC:  Cough and abnormal CXR.Marland Kitchen  History of Present Illness: 53 yobf never smoker dx RA in her early 20's steroid dep x around 2000 but also mtx, plaquenil in past per Dr Coral Spikes and also seen [facility] and due to f/u at [facility].    August 10, 2010   1st pulmonary office eval cc for cough starting around 2006 but much worse x 4 months with constant need to clear the throat,  mucus is clear,  cough so hard looses breath.  Much worse then ended in ER with dx of pna with cp on R rx as outpt with abx  and cp now gone but still coughing.   arthritis worse x 4 months and plans to go establish at [facility].    Pt denies any significant sore throat, dysphagia, itching, sneezing,  nasal congestion or excess secretions,  fever, chills, sweats, unintended wt loss, pleuritic or exertional cp, hempoptysis, variability in activity tolerance  orthopnea pnd or leg swelling. Pt also denies any obvious fluctuation in symptoms with weather or environmental change or other alleviating or aggravating factors.    Not much better with ventolin    Preventive Screening-Counseling & Management  Alcohol-Tobacco     Passive Smoke Exposure: yes  Current Medications (verified): 1)  Calcium Citrate-Vitamin D 250-100 Mg-Unit Tabs (Calcium-Vitamin D) .Marland Kitchen.. 1 Tablet Bid 2)  Boniva 150 Mg Tabs (Ibandronate Sodium) .... Take One Tablet A Month 3)  Folic Acid 1 Mg Tabs (Folic Acid) .... Take 1 Tablet By Mouth Once A Day 4)  Prednisone 2.5 Mg  Tabs (Prednisone) .... Take 1 Tablet By Mouth Once A Day 5)  Zantac 300 Mg Tabs (Ranitidine Hcl) .Marland Kitchen.. 1once Daily As Needed 6)  Fluticasone Propionate 50 Mcg/act Susp (Fluticasone Propionate) .... 2 Spray in Each Nostril Once A Day 7)  Diclofenac Sodium 75 Mg Tbec (Diclofenac Sodium) .... Take 1 Tablet By Mouth Two Times A  Day 8)  Loratadine 10 Mg Tabs (Loratadine) .... Take 1 Tablet By Mouth Once A Day 9)  Ventolin Hfa 108 (90 Base) Mcg/act Aers (Albuterol Sulfate) .Marland Kitchen.. 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 Never smoker. Positive history of passive tobacco smoke exposure.  Passive Smoke Exposure:  yes  Review of Systems       The patient complains of shortness of breath with activity, productive cough, loss of appetite, and headaches.  The patient denies shortness of breath at rest, non-productive cough, coughing up blood, chest pain, irregular heartbeats, acid heartburn, indigestion, weight change, abdominal pain, difficulty swallowing, sore throat, tooth/dental problems, nasal congestion/difficulty breathing through nose, sneezing, itching, ear ache, anxiety, depression, hand/feet swelling, joint stiffness or pain, rash, change in color of mucus, and fever.    Vital Signs:  Patient profile:   50 year old female Weight:      107 pounds O2 Sat:  100 % on Room air Temp:     97.7 degrees F oral Pulse rate:   90 / minute BP sitting:   118 / 80  (left arm)  Vitals Entered By: Vernie Murders (August 10, 2010 3:00 PM)  O2 Flow:  Room air  Physical Exam  Additional Exam:  wt 109 > 107 August 10, 2010  chronically ill bf nad HEENT: nl dentition, turbinates, and orophanx. Nl external ear canals without cough reflex NECK :  without JVD/Nodes/TM/  nl carotid upstrokes bilaterally LUNGS: no acc muscle use, clear to A and P bilaterally without cough on insp or exp maneuvers CV:  RRR  no s3 or murmur or increase in P2, no edema  . ABD:  soft and nontender with nl excursion in the supine position. 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  No further abx needed  Discussed in detail all the  indications, usual  risks and alternatives  relative to the benefits with patient who agrees to proceed with empiric rx with relatively low dose prednisone then regroup  The goal with a chronic steroid dependent illness is always arriving at the lowest effective dose that controls the disease/symptoms and not accepting a set "formula" which is based on statistics that don't take into accound individual variability or the natural hx of the dz in every individual patient, which may well vary over time. her min dose will be 2.5 mg      Problem # 2:  COUGH (ICD-786.2) The most common causes of chronic cough in immunocompetent adults include: upper airway cough syndrome (UACS), previously referred to as postnasal drip syndrome,  caused by variety of rhinosinus conditions; (2) asthma; (3) GERD; (4) chronic bronchitis from cigarette smoking or other inhaled environmental irritants; (5) nonasthmatic eosinophilic bronchitis; and (6) bronchiectasis. These conditions, singly or in combination, have accounted for up to 94% of the causes of chronic cough in prospective  studies.  Strongly favor upper airway cough syndrome for the cause of the chronic cough based on chronicity before the onset of the acute worsening with as dz   Classic Upper airway cough syndrome, so named because it's frequently impossible to sort out how much is  CR/sinusitis with freq throat clearing (which can be related to primary GERD)   vs  causing  secondary extra esophageal GERD from wide swings in gastric pressure that occur with throat clearing, promoting self use of mint and menthol lozenges that reduce the lower esophageal sphincter tone and exacerbate the problem further These are the same pts who not infrequently have failed to tolerate ace inhibitors,  dry powder inhalers or biphosphonates or report having reflux symptoms that don't respond to standard doses of PPI  Try off biphosphonates and rx gerd aggressively with diet and ppi then regroup when returns to f/u problem #1  Medications Added to Medication List This Visit: 1)  Zantac 300 Mg Tabs (Ranitidine hcl) .Marland Kitchen.. 1once daily as needed 2)  Zantac  300 Mg Tabs (Ranitidine hcl) .... One at bedtime 3)  Nexium 40 Mg Cpdr (Esomeprazole magnesium) .... By mouth daily. take one half hour before eating. 4)  Prednisone 10 Mg Tabs (Prednisone) .... 4 each am x 2days, 2x2days, 1x2days and stop  Other Orders: Consultation Level V (16109)  Patient Instructions: 1)  Prednsione 10 mg 4 each am x 2days, 2x2days, 1x2days and stop and resume prednisone 2.5 mg daily 2)  Stop boniva 3)  Start Nexium Take  one 30-60 min before first meal of the day and also Zantac 150 mg one at bedtime  4)  GERD (REFLUX)  is a common cause of respiratory symptoms. It commonly presents without heartburn and can be treated with medication, but also with lifestyle changes including avoidance of late meals, excessive alcohol, smoking cessation, and avoid fatty foods, chocolate, peppermint, colas, red wine, and acidic juices such as orange juice. NO MINT OR MENTHOL  PRODUCTS SO NO COUGH DROPS  5)  USE SUGARLESS CANDY INSTEAD (jolley ranchers)  6)  NO OIL BASED VITAMINS  7)  Please schedule a follow-up appointment in 2 weeks, sooner if needed with cxr Prescriptions: PREDNISONE 10 MG  TABS (PREDNISONE) 4 each am x 2days, 2x2days, 1x2days and stop  #14 x 0   Entered and Authorized by:   Nyoka Cowden MD   Signed by:   Nyoka Cowden MD on 08/10/2010   Method used:   [facility] to        [facility] [address] [phone]* (retail)       [address]       [place], [place]  [address]       Ph: [phone] or [phone]       Fax: [phone]   RxID:   [id number] NEXIUM 40 MG  CPDR (ESOMEPRAZOLE MAGNESIUM) By mouth daily. Take one half hour before eating.  #34 x 3   Entered and Authorized by:   Nyoka Cowden MD   Signed by:   Nyoka Cowden MD on 08/10/2010   Method used:   [facility]        [facility] [address] [phone]* (retail)       [address]       [place], [place]  [address]       Ph: [phone] or [phone]       Fax: [phone]   RxID:   [id number]

## 2010-08-17 NOTE — Progress Notes (Signed)
Summary: nexium too expensive > ok to use pantoprazole  Phone Note Call from Patient Call back at Home Phone [phone]   Caller: Patient Call For: wert Summary of Call: Patient phoned stated that she was given a prescription for Nexium but it is so expensive and she would like to know if there is anything less expensive that she can take. She uses [facility] [address]. She can be reached at [phone] Initial call taken by: Vedia Coffer,  August 13, 2010 4:07 PM  Follow-up for Phone Call        called and spoke with pt.  pt states she saw MW on 08-10-2010 and he prescribed Nexium.  Pt states this med is too expensive ($128) and she cannot afford this.  Pt is requesting an alternative.  Pt states she forgot to mention to MW she does have a bottle of Pantoprazole 40mg  at home and wanted to know if ok to take this med in place of the Nexium.  Please advise. (pt states she did pick up the zantac 150mg  tabs to take qhs. Thanks.  Aundra Millet Reynolds LPN  August 12, 4625 4:35 PM    Additional Follow-up for Phone Call Additional follow up Details #1::        yes, Pantoprazole is fine  Additional Follow-up by: Nyoka Cowden MD,  August 13, 2010 5:07 PM    Additional Follow-up for Phone Call Additional follow up Details #2::    I called the patient and had to leave a message that the new Rx has been sent to the pharmacy and she can go pick up at her pharmacy this evening and if any questions or concerns then call the office.Reynaldo Minium CMA  August 13, 2010 5:13 PM   RX sent.Reynaldo Minium CMA  August 13, 2010 5:15 PM   New/Updated Medications: PANTOPRAZOLE SODIUM 40 MG TBEC (PANTOPRAZOLE SODIUM) take 1 by mouth once daily Prescriptions: PANTOPRAZOLE SODIUM 40 MG TBEC (PANTOPRAZOLE SODIUM) take 1 by mouth once daily  #30 x 1   Entered by:   Reynaldo Minium CMA   Authorized by:   Nyoka Cowden MD   Signed by:   Reynaldo Minium CMA on 08/13/2010   Method used:   Electronically to        [facility] [address] (retail)       [phone]       [place], [place]  [address]       Ph: [phone]       Fax: [phone]   RxID:   [id number]

## 2010-08-25 ENCOUNTER — Encounter: Payer: Self-pay | Admitting: Internal Medicine

## 2010-08-30 ENCOUNTER — Ambulatory Visit: Payer: BC Managed Care – PPO | Admitting: Internal Medicine

## 2010-09-01 ENCOUNTER — Encounter: Payer: Self-pay | Admitting: Internal Medicine

## 2010-09-06 ENCOUNTER — Encounter: Payer: Self-pay | Admitting: Internal Medicine

## 2010-09-06 ENCOUNTER — Ambulatory Visit (INDEPENDENT_AMBULATORY_CARE_PROVIDER_SITE_OTHER): Payer: BC Managed Care – PPO | Admitting: Internal Medicine

## 2010-09-06 DIAGNOSIS — R9389 Abnormal findings on diagnostic imaging of other specified body structures: Secondary | ICD-10-CM

## 2010-09-06 DIAGNOSIS — E041 Nontoxic single thyroid nodule: Secondary | ICD-10-CM

## 2010-09-06 DIAGNOSIS — R053 Chronic cough: Secondary | ICD-10-CM | POA: Insufficient documentation

## 2010-09-06 DIAGNOSIS — M069 Rheumatoid arthritis, unspecified: Secondary | ICD-10-CM

## 2010-09-06 DIAGNOSIS — J984 Other disorders of lung: Secondary | ICD-10-CM

## 2010-09-06 DIAGNOSIS — D539 Nutritional anemia, unspecified: Secondary | ICD-10-CM

## 2010-09-06 DIAGNOSIS — R05 Cough: Secondary | ICD-10-CM

## 2010-09-06 MED ORDER — HYDROCODONE-ACETAMINOPHEN 5-500 MG PO TABS: 1.0000 | ORAL_TABLET | Freq: Four times a day (QID) | ORAL | Status: DC | PRN

## 2010-09-06 MED ORDER — BENZONATATE 200 MG PO CAPS: 200.0000 mg | ORAL_CAPSULE | Freq: Three times a day (TID) | ORAL | Status: DC | PRN

## 2010-09-06 NOTE — Assessment & Plan Note (Addendum)
We'll order a chest CT to followup.

## 2010-09-06 NOTE — Assessment & Plan Note (Signed)
We'll see her rheumatologist in 2 weeks.

## 2010-09-06 NOTE — Patient Instructions (Signed)
Cough, Generic There are many things that can cause a cough, and we are not certain what is causing your cough. Cough may be triggered by pollen, dust, animal dander, molds, certain foods, respiratory infections, exposure to smoke, exercise, or other things that cause allergic reactions or allergies (allergens). HOME CARE INSTRUCTIONS  Use prescription medications as ordered by your caregiver.   Avoid pollen, dust, animal dander, molds, smoke, or anything that seems to cause coughing at home or at work.   You may have fewer attacks if you decrease dust in your home. Electrostatic air cleaners may help.   It may help to replace or cover your pillows or mattress with materials less likely to cause allergies.   If you are not on fluid restriction, drink 8 to 10 glasses of water each day.   Discuss possible exercise routines with your caregiver.   If animal dander is the cause of your cough, you may need to get rid of pets.   A cold steam vaporizer may help your cough.   Avoid cold outside air if this causes coughing episodes.   If you had X-rays today or laboratory work done, make sure you know how to get your results if they were not given to you today. Leave a telephone number with your caregiver where you can be reached and make sure you are available to receive the call. Do not assume everything is normal if you have not heard from your caregiver or the medical facility. It is important for you to follow up on all of your test results.  SEEK MEDICAL CARE IF:  You develop wheezing and/or shortness of breath.   An oral temperature above 101 develops.   You have muscle aches, chest pain, or thickening of mucus or phlegm (sputum).   Your sputum changes from clear or white to yellow, green, gray, or bloody.   You have any problems that may be related to the medicine you are taking (such as a rash, itching, swelling, or trouble breathing).  SEEK IMMEDIATE MEDICAL CARE IF:  There is  increased coughing and/or shortness of breath.   You have increased difficulty breathing.   Any of the problems you had are getting worse.   You develop chest pain, nausea or vomiting or have pain in your chest that radiates into your arms, neck, jaw or teeth. CALL  (911 in [place] Do not attempt to drive yourself for medical care.  MAKE SURE YOU:   Understand these instructions.   Will watch your condition.   Will get help right away if you are not doing well or get worse.  Document Released: 01/01/2004 Document Re-Released: 08/17/2009 [facility] Patient Information 2011 [place], [place].

## 2010-09-06 NOTE — Progress Notes (Signed)
  Subjective:    Patient ID: Jamie Cox, female    DOB: 08-Jul-1960, 50 y.o.   MRN: [id number]  HPI  50 yo f,  August 10, 2010 she had her 1st pulmonary office eval cc for cough starting around 2006 but much worse x 4 months with constant need to clear the throat,  mucus is clear,  cough so hard looses breath.  Much worse then ended in ER with dx of pna with cp on R rx as outpt with abx  and cp now gone but still coughing.   arthritis worse x 4 months and plans to go establish at [facility].    Patient also had a pulmonary nodule which was concerning for malignancy, we will plan to followup. Patient today still complains of cough,  Despite initiation of Tussionex, proton pump inhibitor, and fluticasone by pulmonology to control postnasal drip and gastroesophageal reflux which can come to contribute to her cough.  Review of Systems  Constitutional: Negative for fever, chills, diaphoresis, appetite change, fatigue and unexpected weight change.  Respiratory: Positive for cough. Negative for chest tightness, shortness of breath and wheezing.   Cardiovascular: Negative for chest pain.  [all other systems reviewed and are negative       Objective:   Physical Exam  Constitutional: She is oriented to person, place, and time. She appears well-developed and well-nourished.  HENT:  Head: Normocephalic and atraumatic.  Eyes: EOM are normal. Pupils are equal, round, and reactive to light.  Cardiovascular: Normal rate, regular rhythm and normal heart sounds.   Pulmonary/Chest: Effort normal and breath sounds normal.  Abdominal: Soft. Bowel sounds are normal.  Musculoskeletal: Normal range of motion.  Neurological: She is alert and oriented to person, place, and time.  Skin: Skin is warm and dry.          Assessment & Plan:

## 2010-09-06 NOTE — Assessment & Plan Note (Signed)
Now being followed by pulmonology.

## 2010-09-06 NOTE — Assessment & Plan Note (Signed)
Likely secondary to anemia of chronic disease, we'll perform any at home next followup.

## 2010-09-06 NOTE — Progress Notes (Signed)
Pt aware she needs CXR - will come to [facility] - order faxed. Stanton Kidney Rick Carruthers RN 09/06/10 4:45PM

## 2010-09-07 NOTE — Progress Notes (Signed)
Addended by: Darnelle Maffucci on: 09/07/2010 09:34 AM   Modules accepted: Orders

## 2010-09-07 NOTE — Progress Notes (Signed)
Addended by: Darnelle Maffucci on: 09/07/2010 09:42 AM   Modules accepted: Orders

## 2010-09-08 NOTE — Assessment & Plan Note (Signed)
The most common causes of chronic cough in immunocompetent adults include:  (1) upper airway cough syndrome (UACS), previously referred to as postnasal drip syndrome,  caused by variety of rhinosinus conditions;  (2) asthma;  (3) GERD;  (4) chronic bronchitis from cigarette smoking or other inhaled environmental irritants;  (5) nonasthmatic eosinophilic bronchitis; and  (6) bronchiectasis.   These conditions, singly or in combination, have accounted for up to 94% of the causes of chronic cough in prospective studies. Strongly favor upper airway cough syndrome for the cause of the chronic cough based on chronicity before the onset of the acute worsening with as dz, Classic Upper airway cough syndrome, so named because it's frequently impossible to sort out how much is  CR/sinusitis with freq throat clearing (which can be related to primary GERD)   vs  causing  secondary extra esophageal GERD from wide swings in gastric pressure that occur with throat clearing, promoting self use of mint and menthol lozenges that reduce the lower esophageal sphincter tone and exacerbate the problem further These are the same pts who not infrequently have failed to tolerate ace inhibitors, dry powder inhalers or biphosphonates or report having reflux symptoms that don't respond to standard doses of PPI.  Given that the patient is afebrile, is not having any productive sputum we'll continue her PPI, fluticasone, Claritin and Tussionex. and add Tessalon. Will also schedule for CT scan of chest to follow up her pulmonary nodules.

## 2010-09-09 ENCOUNTER — Encounter: Payer: Self-pay | Admitting: Internal Medicine

## 2010-09-10 ENCOUNTER — Inpatient Hospital Stay (HOSPITAL_COMMUNITY): Admission: RE | Admit: 2010-09-10 | Payer: BC Managed Care – PPO | Source: Ambulatory Visit

## 2010-09-10 ENCOUNTER — Encounter (HOSPITAL_COMMUNITY): Payer: Self-pay

## 2010-09-10 ENCOUNTER — Ambulatory Visit (HOSPITAL_COMMUNITY)
Admission: RE | Admit: 2010-09-10 | Discharge: 2010-09-10 | Disposition: A | Discharge status: Home / Self Care | Payer: BC Managed Care – PPO | Source: Ambulatory Visit | Attending: Internal Medicine | Admitting: Internal Medicine

## 2010-09-10 DIAGNOSIS — R911 Solitary pulmonary nodule: Secondary | ICD-10-CM | POA: Insufficient documentation

## 2010-09-10 DIAGNOSIS — R599 Enlarged lymph nodes, unspecified: Secondary | ICD-10-CM | POA: Insufficient documentation

## 2010-09-10 DIAGNOSIS — J984 Other disorders of lung: Secondary | ICD-10-CM

## 2010-09-10 MED ORDER — IOHEXOL 300 MG/ML  SOLN: 80.0000 mL | Freq: Once | INTRAMUSCULAR | Status: AC | PRN

## 2010-09-13 NOTE — Progress Notes (Signed)
Addended by: Darnelle Maffucci on: 09/13/2010 10:03 AM   Modules accepted: Orders

## 2010-09-14 ENCOUNTER — Ambulatory Visit (INDEPENDENT_AMBULATORY_CARE_PROVIDER_SITE_OTHER): Payer: BC Managed Care – PPO | Admitting: Internal Medicine

## 2010-09-14 ENCOUNTER — Encounter: Payer: Self-pay | Admitting: Internal Medicine

## 2010-09-14 ENCOUNTER — Ambulatory Visit (INDEPENDENT_AMBULATORY_CARE_PROVIDER_SITE_OTHER)
Admission: RE | Admit: 2010-09-14 | Discharge: 2010-09-14 | Disposition: A | Discharge status: Home / Self Care | Payer: BC Managed Care – PPO | Source: Ambulatory Visit | Attending: Internal Medicine | Admitting: Internal Medicine

## 2010-09-14 VITALS — BP 118/80 | HR 119 | Temp 98.2°F | Ht 61.0 in | Wt 105.2 lb

## 2010-09-14 DIAGNOSIS — R05 Cough: Secondary | ICD-10-CM

## 2010-09-14 DIAGNOSIS — J984 Other disorders of lung: Secondary | ICD-10-CM

## 2010-09-14 DIAGNOSIS — R911 Solitary pulmonary nodule: Secondary | ICD-10-CM

## 2010-09-14 MED ORDER — PREDNISONE (PAK) 10 MG PO TABS: ORAL_TABLET | ORAL | Status: AC

## 2010-09-14 NOTE — Patient Instructions (Signed)
Please see patient coordinator before you leave today  to schedule sinus CT  Prednisone 10 mg take two daily until breathing and cough better then 10 mg daily  Add back the Zantac 300 mg at bedtime   Nexium or pantoprazole Take 30- 60 min before your first and last meals of the day   GERD (REFLUX)  is an extremely common cause of respiratory symptoms, many times with no significant heartburn at all.    It can be treated with medication, but also with lifestyle changes including avoidance of late meals, excessive alcohol, smoking cessation, and avoid fatty foods, chocolate, peppermint, colas, red wine, and acidic juices such as orange juice.  NO MINT OR MENTHOL PRODUCTS SO NO COUGH DROPS  USE SUGARLESS CANDY INSTEAD (jolley ranchers or Stover's)  NO OIL BASED VITAMINS   See Tammy NP w/in 3 weeks with all your medications, even over the counter meds, separated in two separate bags, the ones you take no matter what vs the ones you stop once you feel better and take only as needed when you feel you need them.   Tammy  will generate for you a new user friendly medication calendar that will put [place] all on the same page re: your medication use.     Without this process, it simply isn't possible to assure that we are providing  your outpatient care  with  the attention to detail we feel you deserve.   If we cannot assure that you're getting that kind of care,  then we cannot manage your problem effectively from this clinic.  Once you have seen Tammy and we are sure that we're all on the same page with your medication use she will arrange follow up with me with PFT's

## 2010-09-14 NOTE — Assessment & Plan Note (Signed)
Not clear whether this is related to ILD due to connective tissue dz or  Classic Upper airway cough syndrome, so named because it's frequently impossible to sort out how much is  CR/sinusitis with freq throat clearing (which can be related to primary GERD)   vs  causing  secondary (" extra esophageal")  GERD from wide swings in gastric pressure that occur with throat clearing, often  promoting self use of mint and menthol lozenges that reduce the lower esophageal sphincter tone and exacerbate the problem further in a cyclical fashion.   These are the same pts who not infrequently have failed to tolerate ace inhibitors,  dry powder inhalers or biphosphonates or report having reflux symptoms that don't respond to standard doses of PPI , and are easily confused as having aecopd or asthma flares, .  For now max gerd rx then use trust but verify approach with meds before adding more empiric trials    Each maintenance medication was reviewed in detail including most importantly the difference between maintenance and as needed and under what circumstances the prns are to be used.  Please see instructions for details which were reviewed in writing and the patient given a copy.   To keep things simple, I have asked the patient to first separate medicines that are perceived as maintenance, that is to be taken daily "no matter what", from those medicines that are taken on only on an as-needed basis and I have given the patient examples of both, and then return to see our NP to generate a  detailed  medication calendar which should be followed until the next physician sees the patient and updates it.

## 2010-09-14 NOTE — Progress Notes (Signed)
  Subjective:    Patient ID: Jamie Cox, female    DOB: [birth year], 50 y.o.   MRN: [id number]  HPI     Primary = [facility]/  Dr. Darnelle Maffucci      49 yobf never smoker dx RA in her early 20's steroid dep x around 2000 but also mtx, plaquenil in past per Dr Coral Spikes and also seen [facility] and due to f/u at [facility] with ? 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   August 10, 2010 1st pulmonary office eval cc for cough starting around 2006 but much worse x 4 months with constant need to clear the throat, mucus is clear, cough so hard looses breath. Much worse then ended in ER with dx of pna with cp on R rx as outpt with abx and cp now gone but still coughing. arthritis worse x 4 months and plans to go establish at [facility].  Imp was Boop plus/minus uacs rec 1) Prednsione 10 mg 4 each am x 2days, 2x2days, 1x2days and stop and resume prednisone 2.5 mg daily  2) Stop boniva  3) Start Nexium Take one 30-60 min before first meal of the day and also Zantac 150 mg one at bedtime  4) GERD (REFLUX) diet   09/14/2010 ov cc cough dry day = night,  pred now at 2.5 mg per day cough was better on the higher doses but plan is to start plaquenil this week per [facility].  Mostly dry. Pt denies any significant sore throat, dysphagia, itching, sneezing,  nasal congestion or excess/ purulent secretions,  fever, chills, sweats, unintended wt loss, pleuritic or exertional cp, hempoptysis, orthopnea pnd or leg swelling.    Also denies any obvious fluctuation of symptoms with weather or environmental changes or other aggravating or alleviating factors.    Thorougly confused with meds using large bag for all of them.    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  Never smoker.  Positive history of passive tobacco smoke exposure.  Passive Smoke Exposure: yes                 Review of Systems     Objective:   Physical Exam : wt 109 > 107 August 10, 2010  > 105 09/14/2010  chronically ill bf nad  HEENT: nl dentition, turbinates, and orophanx. Nl external ear canals without cough reflex  NECK : without JVD/Nodes/TM/ nl carotid upstrokes bilaterally  LUNGS: no acc muscle use, clear to A and P bilaterally without cough on insp or exp maneuvers  CV: RRR no s3 or murmur or increase in P2, no edema .  ABD: soft and nontender with nl excursion in the supine position. No bruits or organomegaly, bowel sounds nl  MS: warm with classic moderate RA changes hands no calf tenderness, cyanosis or clubbing  SKIN: warm and dry without lesions  NEURO: alert, approp, no deficits        Assessment & Plan:

## 2010-09-15 NOTE — Progress Notes (Signed)
Quick Note:  Spoke with pt and notified of results per Dr. Wert. Pt verbalized understanding and denied any questions.  ______ 

## 2010-09-16 ENCOUNTER — Ambulatory Visit (INDEPENDENT_AMBULATORY_CARE_PROVIDER_SITE_OTHER)
Admission: RE | Admit: 2010-09-16 | Discharge: 2010-09-16 | Disposition: A | Discharge status: Home / Self Care | Payer: BC Managed Care – PPO | Source: Ambulatory Visit | Attending: Internal Medicine | Admitting: Internal Medicine

## 2010-09-16 DIAGNOSIS — R05 Cough: Secondary | ICD-10-CM

## 2010-09-16 DIAGNOSIS — R059 Cough, unspecified: Secondary | ICD-10-CM

## 2010-09-17 ENCOUNTER — Other Ambulatory Visit: Payer: Self-pay | Admitting: Internal Medicine

## 2010-09-17 MED ORDER — AMOXICILLIN-POT CLAVULANATE 875-125 MG PO TABS: 1.0000 | ORAL_TABLET | Freq: Two times a day (BID) | ORAL | Status: AC

## 2010-09-17 NOTE — Progress Notes (Signed)
Quick Note:  Spoke with pt and notified of results/recs per MW. Pt verbalized understanding and rx for augmentin was sent to [facility]. ______

## 2010-09-22 ENCOUNTER — Ambulatory Visit (INDEPENDENT_AMBULATORY_CARE_PROVIDER_SITE_OTHER): Payer: BC Managed Care – PPO | Admitting: Internal Medicine

## 2010-09-22 ENCOUNTER — Encounter: Payer: Self-pay | Admitting: Internal Medicine

## 2010-09-22 DIAGNOSIS — R05 Cough: Secondary | ICD-10-CM

## 2010-09-22 DIAGNOSIS — J984 Other disorders of lung: Secondary | ICD-10-CM

## 2010-09-22 NOTE — Progress Notes (Signed)
  Subjective:    Patient ID: Jamie Cox, female    DOB: 08-11-1960, 50 y.o.   MRN: [id number]  HPI  Patient is a 50 year old female with a past medical history listed levels, as is the outpatient clinic for a two-week followup, and has been seen by [place] and by  pulmonary for chronic cough the patient has been having for the past several months. Recently the patient's PPI was increased, and she had a maxillofacial CT, showed signs of acute sinusitis, and she now is on Augmentin day #3, states that the cough is somewhat getting better.  Denies any other complaints. She was also seen by her rheumatologist which started her on Hydrochloroquine for her RA.   Review of Systems  Respiratory: Positive for cough. Negative for chest tightness, shortness of breath, wheezing and stridor.   [all other systems reviewed and are negative       Objective:   Physical Exam  [nursing notereviewed. Constitutional: She is oriented to person, place, and time. She appears well-developed and well-nourished.  HENT:  Head: Normocephalic and atraumatic.  Eyes: Pupils are equal, round, and reactive to light.  Neck: Normal range of motion. Neck supple. No JVD present. No thyromegaly present.  Cardiovascular: Normal rate, regular rhythm and normal heart sounds.   No murmur heard. Pulmonary/Chest: Effort normal and breath sounds normal. She has no wheezes. She has no rales.  Abdominal: Soft. Bowel sounds are normal.  Musculoskeletal: Normal range of motion. She exhibits no edema.  Neurological: She is alert and oriented to person, place, and time.  Skin: Skin is warm and dry.          Assessment & Plan:

## 2010-09-22 NOTE — Assessment & Plan Note (Addendum)
She had a recent maxillofacial CT which showed acute sinusitis, this may contributing to her cough she is now on Augmentin day #3. Dr. Sherene Sires increase the patient's proton pump inhibitor to twice a day. For now we'll monitor the patient's response to Augmentin and reevaluate next follow up.

## 2010-09-22 NOTE — Assessment & Plan Note (Signed)
Followup CT scan shows stable nodule at 2 years, indicating benign finding

## 2010-10-01 ENCOUNTER — Encounter: Payer: Self-pay | Admitting: Internal Medicine

## 2010-10-04 ENCOUNTER — Encounter: Payer: Self-pay | Admitting: Adult Health

## 2010-10-05 ENCOUNTER — Encounter: Payer: Self-pay | Admitting: Adult Health

## 2010-10-05 ENCOUNTER — Encounter: Payer: Self-pay | Admitting: *Deleted

## 2010-10-05 ENCOUNTER — Ambulatory Visit (INDEPENDENT_AMBULATORY_CARE_PROVIDER_SITE_OTHER): Payer: BC Managed Care – PPO | Admitting: Adult Health

## 2010-10-05 DIAGNOSIS — R05 Cough: Secondary | ICD-10-CM

## 2010-10-05 DIAGNOSIS — R059 Cough, unspecified: Secondary | ICD-10-CM

## 2010-10-05 NOTE — Patient Instructions (Addendum)
Remember to taper Prednisone 10 mg take two daily until breathing and cough better then 10 mg daily May use Mucinex DM Twice daily  For cough/congestion As needed   follow up Dr. Sherene Sires  In 3 weeks with PFTS -lung function test.  Please contact office for sooner follow up if symptoms do not improve or worsen or seek emergency care

## 2010-10-07 ENCOUNTER — Encounter: Payer: Self-pay | Admitting: Adult Health

## 2010-10-07 ENCOUNTER — Other Ambulatory Visit (INDEPENDENT_AMBULATORY_CARE_PROVIDER_SITE_OTHER): Payer: BC Managed Care – PPO

## 2010-10-07 DIAGNOSIS — R05 Cough: Secondary | ICD-10-CM

## 2010-10-07 NOTE — Assessment & Plan Note (Addendum)
Cough flare with superimposed sinusitis-now improving Finished abx.  Advised on meds w/ pt education  Plan:  Remember to taper Prednisone 10 mg take two daily until breathing and cough better then 10 mg daily May use Mucinex DM Twice daily  For cough/congestion As needed   follow up Dr. Sherene Sires  In 3 weeks with PFTS -lung function test.  Please contact office for sooner follow up if symptoms do not improve or worsen or seek emergency care

## 2010-10-07 NOTE — Progress Notes (Signed)
Subjective:    Patient ID: Jamie Cox, female    DOB: Nov 02, 1960, 50 y.o.   MRN: [id number]  HPI Primary = [facility]/  Dr. Darnelle Maffucci    49 yobf never smoker dx RA in her early 20's steroid dep x around 2000 but also mtx, plaquenil in past per Dr Coral Spikes and also seen [facility] and due to f/u at [facility] with ? 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   August 10, 2010 1st pulmonary office eval cc for cough starting around 2006 but much worse x 4 months with constant need to clear the throat, mucus is clear, cough so hard looses breath. Much worse then ended in ER with dx of pna with cp on R rx as outpt with abx and cp now gone but still coughing. arthritis worse x 4 months and plans to go establish at [facility].  Imp was Boop plus/minus uacs rec 1) Prednsione 10 mg 4 each am x 2days, 2x2days, 1x2days and stop and resume prednisone 2.5 mg daily  2) Stop boniva  3) Start Nexium Take one 30-60 min before first meal of the day and also Zantac 150 mg one at bedtime  4) GERD (REFLUX) diet   09/14/10  ov cc cough dry day = night,  pred now at 2.5 mg per day cough was better on the higher doses but plan is to start plaquenil this week per [facility].  Mostly dry.   >>Sinus CT pos+>Augmentin x 10d , zantac At bedtime    10/05/10 Follow up  Pt returns for follow up . CT scan last ov was positive for acute sinusitis. She was treated with 10 days of Augmentin. She is feeling some better with decreased cough and congestion . Steroids set w/ floor of 10 and ceiling of 20mg . SHe has not tapered down to 10mg  b/c breathing not back to her baseline.    Review of Systems Constitutional:   No  weight loss, night sweats,  Fevers, chills  HEENT:   No headaches,  Difficulty swallowing,  Tooth/dental problems, or  Sore throat,                No sneezing, itching, ear ache, nasal congestion, post nasal drip,   CV:  No chest pain,  Orthopnea, PND, swelling in lower extremities, anasarca, dizziness, palpitations,  syncope.   GI  No heartburn, indigestion, abdominal pain, nausea, vomiting, diarrhea, change in bowel habits, loss of appetite, bloody stools.     Resp:No change in color of mucus.  No wheezing.  No chest wall deformity No coughing up of blood.   Skin: no rash or lesions.  GU: no dysuria, change in color of urine, no urgency or frequency.  No flank pain, no hematuria   MS:  No joint pain or swelling.  No decreased range of motion.  No back pain.  Psych:  No change in mood or affect. No depression or anxiety.  No memory loss.          Objective:   Physical Exam   chronically ill bf nad  HEENT: nl dentition, turbinates, and orophanx. Nl external ear canals without cough reflex  NECK : without JVD/Nodes/TM/ nl carotid upstrokes bilaterally  LUNGS: no acc muscle use, clear to A and P bilaterally without cough on insp or exp maneuvers  CV: RRR no s3 or murmur or increase in P2, no edema .  ABD: soft and nontender with nl excursion in the supine position. No bruits or organomegaly, bowel sounds  nl  MS: warm with classic moderate RA changes hands no calf tenderness, cyanosis or clubbing  SKIN: warm and dry without lesions  NEURO: alert, approp, no deficits        Assessment & Plan:

## 2010-10-16 ENCOUNTER — Other Ambulatory Visit: Payer: Self-pay | Admitting: Internal Medicine

## 2010-11-10 ENCOUNTER — Ambulatory Visit: Payer: BC Managed Care – PPO | Admitting: Internal Medicine

## 2010-12-23 ENCOUNTER — Ambulatory Visit (INDEPENDENT_AMBULATORY_CARE_PROVIDER_SITE_OTHER): Payer: BC Managed Care – PPO | Admitting: Internal Medicine

## 2010-12-23 ENCOUNTER — Encounter: Payer: Self-pay | Admitting: Internal Medicine

## 2010-12-23 VITALS — BP 130/80 | HR 86 | Temp 97.5°F | Ht 61.0 in | Wt 105.0 lb

## 2010-12-23 DIAGNOSIS — M069 Rheumatoid arthritis, unspecified: Secondary | ICD-10-CM

## 2010-12-23 DIAGNOSIS — R05 Cough: Secondary | ICD-10-CM

## 2010-12-23 LAB — PULMONARY FUNCTION TEST

## 2010-12-23 NOTE — Progress Notes (Signed)
Subjective:    Patient ID: Jamie Cox, female    DOB: 1961/04/03, 50 y.o.   MRN: [id number]  HPI Primary = [facility]/  Dr. Darnelle Maffucci    50 yobf never smoker dx RA in her early 20's steroid dep x around 2000 but also mtx, plaquenil in past per Dr Coral Spikes and also seen [facility] and due to f/u at [facility] with ? 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   August 10, 2010 1st pulmonary office eval cc for cough starting around 2006 but much worse x 4 months with constant need to clear the throat, mucus is clear, cough so hard looses breath. Much worse then ended in ER with dx of pna with cp on R rx as outpt with abx and cp now gone but still coughing. arthritis worse x 4 months and plans to go establish at [facility].  Imp was Boop plus/minus uacs rec 1) Prednsione 10 mg 4 each am x 2days, 2x2days, 1x2days and stop and resume prednisone 2.5 mg daily  2) Stop boniva  3) Start Nexium Take one 30-60 min before first meal of the day and also Zantac 150 mg one at bedtime  4) GERD (REFLUX) diet   09/14/10  ov cc cough dry day = night,  pred now at 2.5 mg per day cough was better on the higher doses but plan is to start plaquenil this week per [facility].  Mostly dry.   >>Sinus CT pos+>Augmentin x 10d , zantac At bedtime    10/05/10 Follow up  Pt returns for follow up . CT scan last ov was positive for acute sinusitis. She was treated with 10 days of Augmentin. She is feeling some better with decreased cough and congestion . Steroids set w/ floor of 10 and ceiling of 20mg . SHe has not tapered down to 10mg  b/c breathing not back to her baseline.  rec Remember to taper Prednisone 10 mg take two daily until breathing and cough better then 10 mg daily May use Mucinex DM Twice daily  For cough/congestion As needed   follow up Dr. Sherene Sires  In 3 weeks with PFTS -lung function test.       12/23/2010 ov/Castor Gittleman  taperd per rheum to  2.5 prednisone per day,  One episode of cp post on R same location as before but not as  intense and better since the z pack, not consistent with ppi ac but is holding boniva. Overall better but concerned cough still intermittently present. No purulent sputum.  Pt denies any significant sore throat, dysphagia, itching, sneezing,  nasal congestion or excess/ purulent secretions,  fever, chills, sweats, unintended wt loss, pleuritic or exertional cp, hempoptysis, orthopnea pnd or leg swelling.    Also denies any obvious fluctuation of symptoms with weather or environmental changes or other aggravating or alleviating factors.           Objective:   Physical Exam   chronically ill bf nad   Wt 105 12/23/2010  HEENT: nl dentition, turbinates, and orophanx. Nl external ear canals without cough reflex  NECK : without JVD/Nodes/TM/ nl carotid upstrokes bilaterally  LUNGS: no acc muscle use, clear to A and P bilaterally without cough on insp or exp maneuvers  CV: RRR no s3 or murmur or increase in P2, no edema .  ABD: soft and nontender with nl excursion in the supine position. No bruits or organomegaly, bowel sounds nl  MS: warm with classic moderate RA changes hands no calf tenderness, cyanosis or clubbing  SKIN: warm and dry without lesions  NEURO: alert, approp, no deficits        Assessment & Plan:

## 2010-12-23 NOTE — Assessment & Plan Note (Signed)
Steroid dep chronically, needs w/u for RA ILD/ bronchiectasis > CT ordererd

## 2010-12-23 NOTE — Patient Instructions (Signed)
For cough take mucinex dm twice daily( 1200mg  every 12 hours) and if this fails use vicodin  Protonix 40 mg Take 30-60 min before first meal of the day and Zantac at bedtime perfectly regularly for a month  Please see patient coordinator before you leave today  to schedule for CT chest and sinus

## 2010-12-23 NOTE — Assessment & Plan Note (Addendum)
Better p rx for sinusitis, if still not improved needs ent eval  .Chronic cough is often simultaneously caused by more than one condition. A single cause has been found from 38 to 82% of the time, multiple causes from 18 to 62%. Multiply caused cough has been the result of three diseases up to 42% of the time.    So continue to suppress GERD also. See instructions for specific recommendations which were reviewed directly with the patient who was given a copy with highlighter outlining the key components.

## 2010-12-23 NOTE — Progress Notes (Signed)
PFT was done today.  

## 2010-12-24 ENCOUNTER — Ambulatory Visit (INDEPENDENT_AMBULATORY_CARE_PROVIDER_SITE_OTHER)
Admission: RE | Admit: 2010-12-24 | Discharge: 2010-12-24 | Disposition: A | Discharge status: Home / Self Care | Payer: BC Managed Care – PPO | Source: Ambulatory Visit | Attending: Internal Medicine | Admitting: Internal Medicine

## 2010-12-24 DIAGNOSIS — R05 Cough: Secondary | ICD-10-CM

## 2010-12-27 NOTE — Progress Notes (Signed)
Quick Note:  Spoke with pt and notified of results per Dr. Wert. Pt verbalized understanding and denied any questions.  ______ 

## 2010-12-27 NOTE — Progress Notes (Signed)
Quick Note:  Spoke with pt and notified of results per Dr. Wert. Pt verbalized understanding and denied any questions.,  ______ 

## 2011-01-01 ENCOUNTER — Other Ambulatory Visit: Payer: Self-pay | Admitting: Internal Medicine

## 2011-01-03 ENCOUNTER — Other Ambulatory Visit: Payer: Self-pay | Admitting: *Deleted

## 2011-01-03 ENCOUNTER — Encounter: Payer: Self-pay | Admitting: Internal Medicine

## 2011-01-03 DIAGNOSIS — M069 Rheumatoid arthritis, unspecified: Secondary | ICD-10-CM

## 2011-01-03 MED ORDER — HYDROCODONE-ACETAMINOPHEN 5-500 MG PO TABS: 1.0000 | ORAL_TABLET | Freq: Four times a day (QID) | ORAL | Status: DC | PRN

## 2011-01-03 NOTE — Telephone Encounter (Signed)
Refill approved.  Please schedule a follow-up appointment with patient's PCP.

## 2011-01-03 NOTE — Telephone Encounter (Signed)
Vicodin refilled- rx request from faxed to [facility] .

## 2011-01-03 NOTE — Telephone Encounter (Signed)
Message sent to front desk for an appt. 

## 2011-01-03 NOTE — Telephone Encounter (Signed)
I will fill for 1 month and then let her primary decide whether she needs to be on this chronically.

## 2011-01-15 ENCOUNTER — Encounter: Payer: Self-pay | Admitting: Internal Medicine

## 2011-01-31 ENCOUNTER — Encounter: Payer: BC Managed Care – PPO | Admitting: Internal Medicine

## 2011-04-05 ENCOUNTER — Ambulatory Visit (INDEPENDENT_AMBULATORY_CARE_PROVIDER_SITE_OTHER): Payer: BC Managed Care – PPO | Admitting: Internal Medicine

## 2011-04-05 ENCOUNTER — Encounter: Payer: Self-pay | Admitting: Internal Medicine

## 2011-04-05 VITALS — BP 118/74 | HR 108 | Temp 97.4°F | Ht 61.0 in | Wt 104.7 lb

## 2011-04-05 DIAGNOSIS — R059 Cough, unspecified: Secondary | ICD-10-CM

## 2011-04-05 DIAGNOSIS — Z23 Encounter for immunization: Secondary | ICD-10-CM

## 2011-04-05 DIAGNOSIS — R05 Cough: Secondary | ICD-10-CM

## 2011-04-05 DIAGNOSIS — M069 Rheumatoid arthritis, unspecified: Secondary | ICD-10-CM

## 2011-04-05 MED ORDER — PREDNISONE 2.5 MG PO TABS: 2.5000 mg | ORAL_TABLET | Freq: Every day | ORAL | Status: DC

## 2011-04-05 MED ORDER — DEXTROMETHORPHAN HBR 20 MG/15ML PO SYRP: 15.0000 mL | ORAL_SOLUTION | Freq: Every evening | ORAL | Status: DC | PRN

## 2011-04-05 MED ORDER — HYDROCODONE-ACETAMINOPHEN 5-500 MG PO TABS: 1.0000 | ORAL_TABLET | Freq: Four times a day (QID) | ORAL | Status: DC | PRN

## 2011-04-05 MED ORDER — DICLOFENAC SODIUM 75 MG PO TBEC: 75.0000 mg | DELAYED_RELEASE_TABLET | Freq: Two times a day (BID) | ORAL | Status: DC | PRN

## 2011-04-05 MED ORDER — RANITIDINE HCL 300 MG PO TABS: 300.0000 mg | ORAL_TABLET | Freq: Every day | ORAL | Status: DC

## 2011-04-05 MED ORDER — LORATADINE 10 MG PO TABS: 10.0000 mg | ORAL_TABLET | Freq: Every day | ORAL | Status: DC

## 2011-04-05 NOTE — Progress Notes (Signed)
  Subjective:    Patient ID: Jamie Cox, female    DOB: August 06, 1960, 50 y.o.   MRN: [id number]  HPI  Patient is a 50 year old female with a past medical history listed below, presents to the outpatient clinic for routine followup, and has been seen by [place] and by Dr. Sherene Sires from [facility] pulmonary for chronic cough that the patient has been having for the past several months. She is scheduled to see him again soon, as of now there is no clear etiology of her cough.  Today she would also like a flu shot. She is otherwise uptodate of her health maintenance.  Denies any other complaints. She was also seen by her rheumatologist which started her on Hydrochloroquine for her RA.   Review of Systems  Respiratory: Positive for cough.   All other systems reviewed and are negative.       Objective:   Physical Exam  Nursing note and vitals reviewed. Constitutional: She is oriented to person, place, and time. She appears well-developed and well-nourished.  HENT:  Head: Normocephalic and atraumatic.  Eyes: Pupils are equal, round, and reactive to light.  Neck: Normal range of motion. Neck supple. No JVD present. No thyromegaly present.  Cardiovascular: Normal rate, regular rhythm and normal heart sounds.   No murmur heard. Pulmonary/Chest: Effort normal and breath sounds normal. She has no wheezes. She has no rales.  Abdominal: Soft. Bowel sounds are normal.  Musculoskeletal: Normal range of motion. She exhibits no edema.  Neurological: She is alert and oriented to person, place, and time.  Skin: Skin is warm and dry.          Assessment & Plan:

## 2011-04-05 NOTE — Patient Instructions (Signed)
Please take your medications as instructed.

## 2011-04-05 NOTE — Assessment & Plan Note (Addendum)
Patient presents with nonproductive cough ongoing for the past several months, and is followed by Dr. Sherene Sires. No clear etiology found. Will add dextromethorphan to her regimen today. And encourage her to take her other medications as instructed by Dr. Sherene Sires. May need an ENT eval if does not improve.

## 2011-05-04 ENCOUNTER — Ambulatory Visit: Payer: BC Managed Care – PPO | Admitting: Internal Medicine

## 2011-05-24 ENCOUNTER — Encounter: Payer: Self-pay | Admitting: *Deleted

## 2011-05-24 ENCOUNTER — Encounter: Payer: Self-pay | Admitting: Internal Medicine

## 2011-05-24 ENCOUNTER — Ambulatory Visit (INDEPENDENT_AMBULATORY_CARE_PROVIDER_SITE_OTHER): Payer: BC Managed Care – PPO | Admitting: Internal Medicine

## 2011-05-24 DIAGNOSIS — M0510 Rheumatoid lung disease with rheumatoid arthritis of unspecified site: Secondary | ICD-10-CM

## 2011-05-24 DIAGNOSIS — R05 Cough: Secondary | ICD-10-CM

## 2011-05-24 MED ORDER — HYDROCODONE-ACETAMINOPHEN 5-500 MG PO TABS: 1.0000 | ORAL_TABLET | Freq: Four times a day (QID) | ORAL | Status: DC | PRN

## 2011-05-24 MED ORDER — PREDNISONE (PAK) 10 MG PO TABS: ORAL_TABLET | ORAL | Status: AC

## 2011-05-24 NOTE — Patient Instructions (Addendum)
Prednisone 10mg  take 2 until better, then 1 daily x one week then one half daily thereafter - if worse start back at 2 daily  Please schedule a follow up office visit in 6 weeks, call sooner if needed

## 2011-05-24 NOTE — Progress Notes (Signed)
Subjective:    Patient ID: Jamie Cox, female    DOB: 01-Oct-1960, 50 y.o.   MRN: [id number]  HPI Primary = [facility]/  Dr. Darnelle Maffucci    50 yobf never smoker dx RA in her early 20's steroid dep x around 2000 but also mtx, plaquenil in past per Dr Coral Spikes and also seen [facility] and due to f/u at [facility] with ? 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   August 10, 2010 1st pulmonary office eval cc for cough starting around 2006 but much worse x 4 months with constant need to clear the throat, mucus is clear, cough so hard looses breath. Much worse then ended in ER with dx of pna with cp on R rx as outpt with abx and cp now gone but still coughing. arthritis worse x 4 months and plans to go establish at [facility].  Imp was Boop plus/minus uacs rec 1) Prednsione 10 mg 4 each am x 2days, 2x2days, 1x2days and stop and resume prednisone 2.5 mg daily  2) Stop boniva  3) Start Nexium Take one 30-60 min before first meal of the day and also Zantac 150 mg one at bedtime  4) GERD (REFLUX) diet   09/14/10  ov cc cough dry day = night,  pred now at 2.5 mg per day cough was better on the higher doses but plan is to start plaquenil this week per [facility].  Mostly dry.   >>Sinus CT pos+>Augmentin x 10d , zantac At bedtime    10/05/10 Follow up  Pt returns for follow up . CT scan last ov was positive for acute sinusitis. She was treated with 10 days of Augmentin. She is feeling some better with decreased cough and congestion . Steroids set w/ floor of 10 and ceiling of 20mg . She has not tapered down to 10mg  b/c breathing not back to her baseline.  rec Remember to taper Prednisone 10 mg take two daily until breathing and cough better then 10 mg daily May use Mucinex DM Twice daily  For cough/congestion As needed   follow up Dr. Sherene Sires  In 3 weeks with PFTS -lung function test.       12/23/2010 ov/Jamie Cox  tapered per rheum to  2.5 prednisone per day,  One episode of cp post on R same location as before but not  as intense and better since the z pack, not consistent with ppi ac but is holding boniva. Overall better but concerned cough still intermittently present. No purulent sputum. rec For cough take mucinex dm twice daily( 1200mg  every 12 hours) and if this fails use vicodin Protonix 40 mg Take 30-60 min before first meal of the day and Zantac at bedtime perfectly regularly for a month Please see patient coordinator before you leave today  to schedule for CT chest and sinus > improved   05/24/2011 f/u ov/Jamie Cox on 2.5 mg Prednisone  cc worse dry  cough x sev months p traveling by car to [place] Cough so severe gets light headed, ? Better p saba,  Worse if get overheated, no excess or purulent sputum or subj wheezing.  Pt denies any significant sore throat, dysphagia, itching, sneezing,  nasal congestion or excess/ purulent secretions,  fever, chills, sweats, unintended wt loss, pleuritic or exertional cp, hempoptysis, orthopnea pnd or leg swelling.    Also denies any obvious fluctuation of symptoms with weather or environmental changes or other aggravating or alleviating factors.           Objective:  Physical Exam   chronically ill bf nad   Wt 105 12/23/2010 > 05/24/2011  105 HEENT: nl dentition, turbinates, and orophanx. Nl external ear canals without cough reflex  NECK : without JVD/Nodes/TM/ nl carotid upstrokes bilaterally  LUNGS: no acc muscle use, clear to A and P bilaterally without cough on insp or exp maneuvers  CV: RRR no s3 or murmur or increase in P2, no edema .  ABD: soft and nontender with nl excursion in the supine position. No bruits or organomegaly, bowel sounds nl  MS: warm with classic moderate RA changes hands no calf tenderness, cyanosis or clubbing         Assessment & Plan:

## 2011-05-26 ENCOUNTER — Encounter: Payer: Self-pay | Admitting: Internal Medicine

## 2011-05-26 DIAGNOSIS — M0510 Rheumatoid lung disease with rheumatoid arthritis of unspecified site: Secondary | ICD-10-CM | POA: Insufficient documentation

## 2011-05-26 NOTE — Assessment & Plan Note (Addendum)
DDx for pulmonary fibrosis  includes idiopathic pulmonary fibrosis, pulmonary fibrosis associated with rheumatologic diseases (which have a relatively benign course in most cases) , adverse effect from  drugs such as chemotherapy or amiodarone exposure, nonspecific interstitial pneumonia which is typically steroid responsive, and chronic hypersensitivity pneumonitis.   In active  smokers Langerhan's Cell  Histiocyctosis (eosinophilic granuomatosis),  DIP,  and Respiratory Bronchiolitis ILD also need to be considered,    This is most likely RA related lung dz and has a steroid dep component that flared as prednisone was tapered  The goal with a chronic steroid dependent illness is always arriving at the lowest effective dose that controls the disease/symptoms and not accepting a set "formula" which is based on statistics or guidelines that don't always take into account patient  variability or the natural hx of the dz in every individual patient, which may well vary over time.  For now therefore I recommend the patient maintain  A ceiling of 20 mg per day and a floor of 10 mg per day for at least the next 4-6 weeks then regroup p the holidays

## 2011-05-26 NOTE — Assessment & Plan Note (Signed)
The most common causes of chronic cough in immunocompetent adults include the following: upper airway cough syndrome (UACS), previously referred to as postnasal drip syndrome (PNDS), which is caused by variety of rhinosinus conditions; (2) asthma; (3) GERD; (4) chronic bronchitis from cigarette smoking or other inhaled environmental irritants; (5) nonasthmatic eosinophilic bronchitis; and (6) bronchiectasis.   These conditions, singly or in combination, have accounted for up to 94% of the causes of chronic cough in prospective studies.   Other conditions have constituted no >6% of the causes in prospective studies These have included bronchogenic carcinoma, chronic interstitial pneumonia, sarcoidosis, left ventricular failure, ACEI-induced cough, and aspiration from a condition associated with pharyngeal dysfunction.   .Chronic cough is often simultaneously caused by more than one condition. A single cause has been found from 38 to 82% of the time, multiple causes from 18 to 62%. Multiply caused cough has been the result of three diseases up to 42% of the time.     ? RA related > try increase prednisone to 20 mg per day until better then taper to floor of 10 mg per day

## 2011-08-18 ENCOUNTER — Other Ambulatory Visit: Payer: Self-pay | Admitting: *Deleted

## 2011-08-18 DIAGNOSIS — K219 Gastro-esophageal reflux disease without esophagitis: Secondary | ICD-10-CM

## 2011-08-18 DIAGNOSIS — M069 Rheumatoid arthritis, unspecified: Secondary | ICD-10-CM

## 2011-08-18 MED ORDER — PREDNISONE 2.5 MG PO TABS
2.5000 mg | ORAL_TABLET | Freq: Every day | ORAL | Status: DC
Start: 2011-08-18 — End: 2012-01-29

## 2011-08-18 MED ORDER — HYDROCODONE-ACETAMINOPHEN 5-500 MG PO TABS: 1.0000 | ORAL_TABLET | Freq: Four times a day (QID) | ORAL | Status: DC | PRN

## 2011-08-18 MED ORDER — PANTOPRAZOLE SODIUM 40 MG PO TBEC: 40.0000 mg | DELAYED_RELEASE_TABLET | Freq: Every day | ORAL | Status: DC

## 2011-08-18 MED ORDER — DICLOFENAC SODIUM 75 MG PO TBEC: 75.0000 mg | DELAYED_RELEASE_TABLET | Freq: Two times a day (BID) | ORAL | Status: DC | PRN

## 2011-08-18 NOTE — Telephone Encounter (Signed)
Vicodin rx called to [facility] pharmacy. 

## 2011-08-31 ENCOUNTER — Telehealth: Payer: Self-pay | Admitting: Internal Medicine

## 2011-08-31 NOTE — Telephone Encounter (Signed)
Member # [id number].  Pt has tried and failed Omeprazole and Nexium.  DX:  GERD. Pantoprazole APPROVED from 08/10/11 through 08/30/12. Patient and pharmacy have been notified.

## 2011-09-21 ENCOUNTER — Ambulatory Visit (INDEPENDENT_AMBULATORY_CARE_PROVIDER_SITE_OTHER)
Admission: RE | Admit: 2011-09-21 | Discharge: 2011-09-21 | Disposition: A | Discharge status: Home / Self Care | Payer: BC Managed Care – PPO | Source: Ambulatory Visit | Attending: Internal Medicine | Admitting: Internal Medicine

## 2011-09-21 ENCOUNTER — Ambulatory Visit (INDEPENDENT_AMBULATORY_CARE_PROVIDER_SITE_OTHER): Payer: BC Managed Care – PPO | Admitting: Internal Medicine

## 2011-09-21 ENCOUNTER — Encounter: Payer: Self-pay | Admitting: Internal Medicine

## 2011-09-21 VITALS — BP 132/62 | HR 108 | Temp 98.3°F | Ht 61.0 in | Wt 102.4 lb

## 2011-09-21 DIAGNOSIS — M810 Age-related osteoporosis without current pathological fracture: Secondary | ICD-10-CM

## 2011-09-21 DIAGNOSIS — K219 Gastro-esophageal reflux disease without esophagitis: Secondary | ICD-10-CM

## 2011-09-21 DIAGNOSIS — M0510 Rheumatoid lung disease with rheumatoid arthritis of unspecified site: Secondary | ICD-10-CM

## 2011-09-21 MED ORDER — PANTOPRAZOLE SODIUM 40 MG PO TBEC: 40.0000 mg | DELAYED_RELEASE_TABLET | Freq: Every day | ORAL | Status: DC

## 2011-09-21 MED ORDER — PREDNISONE (PAK) 10 MG PO TABS: ORAL_TABLET | ORAL | Status: AC

## 2011-09-21 NOTE — Progress Notes (Signed)
Subjective:    Patient ID: Jamie Cox, female    DOB: 09-21-1960, 51 y.o.   MRN: [id number]  HPI Primary = [facility]/  Dr. 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   August 10, 2010 1st pulmonary office eval cc for cough starting around 2006 but much worse x 4 months with constant need to clear the throat, mucus is clear, cough so hard looses breath. Much worse then ended in ER with dx of pna with cp on R rx as outpt with abx and cp now gone but still coughing. arthritis worse x 4 months and plans to go establish at [facility].  Imp was Boop plus/minus uacs rec 1) Prednsione 10 mg 4 each am x 2days, 2x2days, 1x2days and stop and resume prednisone 2.5 mg daily  2) Stop boniva  3) Start Nexium Take one 30-60 min before first meal of the day and also Zantac 150 mg one at bedtime  4) GERD (REFLUX) diet   09/14/10  ov cc cough dry day = night,  pred now at 2.5 mg per day cough was better on the higher doses but plan is to start plaquenil this week per [facility] Cox.  Mostly dry.   >>Sinus CT pos+>Augmentin x 10d , zantac At bedtime    10/05/10 Follow up  Pt returns for follow up . CT scan last ov was positive for acute sinusitis. She was treated with 10 days of Augmentin. She is feeling some better with decreased cough and congestion . Steroids set w/ floor of 10 and ceiling of 20mg . She has not tapered down to 10mg  b/c breathing not back to her baseline.  rec Remember to taper Prednisone 10 mg take two daily until breathing and cough better then 10 mg daily May use Mucinex DM Twice daily  For cough/congestion As needed   follow up [facility]  In 3 weeks with PFTS -lung function test.     12/23/2010 ov/[facility]  tapered per rheum to  2.5 prednisone per day,  One episode of cp post on R same location as before but not as  intense and better since the z pack, not consistent with ppi ac but is holding boniva. Overall better but concerned cough still intermittently present. No purulent sputum. rec For cough take mucinex dm twice daily( 1200mg  every 12 hours) and if this fails use vicodin Protonix 40 mg Take 30-60 min before first meal of the day and Zantac at bedtime perfectly regularly for a month Please see patient coordinator before you leave today  to schedule for CT chest and sinus > improved   05/24/2011 f/u ov/[facility] on 2.5 mg Prednisone  cc worse dry  cough x sev months p traveling by car to [facility]. Cough so severe gets light headed, ? Better p saba,  Worse if get overheated, no excess or purulent sputum or subj wheezing. rec Prednisone 10mg  take 2 until better, then 1 daily x one week then one half daily thereafter - if worse start back at 2 daily  09/21/2011 f/u ov/Jamie Cox 2 pred daily > fine,  1 daily was fine, half daily also ok then started 2.5 mg per day and worse dry cough since with no rash, arthralgias or sign  Limiting sob or overt hb still  holding boniva. No overt sinus symptoms.  Pt denies any significant sore throat, dysphagia, itching, sneezing,  nasal congestion or excess/ purulent secretions,  fever, chills, sweats, unintended wt loss, pleuritic or exertional cp, hempoptysis, orthopnea pnd or leg swelling.    Also denies any obvious fluctuation of symptoms with weather or environmental changes or other aggravating or alleviating factors.           Objective:   Physical Exam  Chronically ill bf nad Wt 105 12/23/2010 > 05/24/2011  105 > 09/21/2011  102 HEENT: nl dentition, turbinates, and orophanx. Nl external ear canals without cough reflex  NECK : without JVD/Nodes/TM/ nl carotid upstrokes bilaterally  LUNGS: no acc muscle use, clear to A and P bilaterally without cough on insp or exp maneuvers  CV: RRR no s3 or murmur or increase in P2, no edema .  ABD: soft and nontender with nl  excursion in the supine position. No bruits or organomegaly, bowel sounds nl  MS: warm with classic moderate RA changes hands no calf tenderness, cyanosis or clubbing      CXR  09/21/2011 :   Similar pulmonary nodularity and left basilar scarring most consistent with chronic inflammatory process, possibly atypical mycobacterial infection. No acute findings demonstrated.     Assessment & Plan:

## 2011-09-21 NOTE — Assessment & Plan Note (Signed)
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  For now therefore I recommend the patient maintain  A ceiling of 20 mg per day and a floor of 5 mg per day no lower  Due for yearly pft's in July 2013 for apples to apples comparisons to previous studies.

## 2011-09-21 NOTE — Patient Instructions (Addendum)
Please remember to go to the  x-ray department downstairs for your tests - we will call you with the results when they are available.  Prednisone 10 mg 2 daily until better, 1 daily for a week, then one half daily but if any worse cough or breathing go back to the previous dose that worked  Please schedule a follow up visit in 3 months but call sooner if needed with your annual pft's due then

## 2011-09-21 NOTE — Assessment & Plan Note (Signed)
She has no overt hb and doubt cough is related to boniva so ok to resume rx monthly with alternative being yearly IV relcast

## 2011-09-22 ENCOUNTER — Other Ambulatory Visit: Payer: Self-pay | Admitting: Internal Medicine

## 2011-09-23 NOTE — Progress Notes (Signed)
Quick Note:  Spoke with pt and notified of results per Dr. Wert. Pt verbalized understanding and denied any questions.  ______ 

## 2011-11-10 ENCOUNTER — Other Ambulatory Visit: Payer: Self-pay | Admitting: *Deleted

## 2011-11-10 DIAGNOSIS — M069 Rheumatoid arthritis, unspecified: Secondary | ICD-10-CM

## 2011-11-11 MED ORDER — HYDROCODONE-ACETAMINOPHEN 5-500 MG PO TABS: 1.0000 | ORAL_TABLET | Freq: Four times a day (QID) | ORAL | Status: DC | PRN

## 2011-11-11 MED ORDER — IBANDRONATE SODIUM 150 MG PO TABS: 150.0000 mg | ORAL_TABLET | ORAL | Status: DC

## 2011-11-11 MED ORDER — RANITIDINE HCL 300 MG PO TABS: 300.0000 mg | ORAL_TABLET | Freq: Every day | ORAL | Status: DC

## 2011-11-11 NOTE — Telephone Encounter (Signed)
Both Rx called into pharmacy. Pt aware.

## 2011-12-01 ENCOUNTER — Emergency Department (INDEPENDENT_AMBULATORY_CARE_PROVIDER_SITE_OTHER)
Admission: EM | Admit: 2011-12-01 | Discharge: 2011-12-01 | Disposition: A | Discharge status: Home Health | Payer: BC Managed Care – PPO | Source: Home / Self Care | Attending: Family Medicine | Admitting: Family Medicine

## 2011-12-01 ENCOUNTER — Encounter (HOSPITAL_COMMUNITY): Payer: Self-pay

## 2011-12-01 DIAGNOSIS — J069 Acute upper respiratory infection, unspecified: Secondary | ICD-10-CM

## 2011-12-01 DIAGNOSIS — J40 Bronchitis, not specified as acute or chronic: Secondary | ICD-10-CM

## 2011-12-01 MED ORDER — AZITHROMYCIN 250 MG PO TABS: 250.0000 mg | ORAL_TABLET | Freq: Every day | ORAL | Status: AC

## 2011-12-01 MED ORDER — BENZONATATE 100 MG PO CAPS: 100.0000 mg | ORAL_CAPSULE | Freq: Three times a day (TID) | ORAL | Status: AC

## 2011-12-01 NOTE — ED Notes (Signed)
Pt c/o nasal congestion and productive cough with brown sputum for past year.  Pt denies fever.  Pt has seen for this in past.

## 2011-12-01 NOTE — ED Provider Notes (Signed)
History     CSN: [id number]  Arrival date & time 12/01/11  1118   First MD Initiated Contact with Patient 12/01/11 1121      Chief Complaint  Patient presents with  . Nasal Congestion    (Consider location/radiation/quality/duration/timing/severity/associated sxs/prior treatment) HPI Comments: The patient reports having a cough for over a year that her pcp has been eval for. Cough recently worsened Monday when she developed runny nose and sinus congestion. No fever. Cough is occ productive. No sore throat. No ear pain. No weigh loss. Denies shortness of breath. Hx  Sig for RA, lupus and anemia  The history is provided by the patient.    Past Medical History  Diagnosis Date  . Systemic lupus erythematosus   . Personal history of other diseases of circulatory system   . Osteoporosis, unspecified     steroid induced  . Unspecified deficiency anemia     microcytic    History reviewed. No pertinent past surgical history.  Family History  Problem Relation Age of Onset  . Liver cancer Maternal Grandmother   . Scleroderma Mother     History  Substance Use Topics  . Smoking status: Never Smoker   . Smokeless tobacco: Not on file  . Alcohol Use: No    OB History    Grav Para Term Preterm Abortions TAB SAB Ect Mult Living                  Review of Systems  Constitutional: Negative.   HENT: Positive for congestion, rhinorrhea and postnasal drip. Negative for ear pain, sore throat and mouth sores.   Respiratory: Positive for cough. Negative for apnea, choking, shortness of breath and wheezing.   Cardiovascular: Negative.   Gastrointestinal: Negative.   Genitourinary: Negative.   Musculoskeletal: Negative.     Allergies  Review of patient's allergies indicates no known allergies.  Home Medications   Current Outpatient Rx  Name Route Sig Dispense Refill  . ACETAMINOPHEN ER 650 MG PO TBCR Oral Take 650 mg by mouth every 8 (eight) hours as needed.      . ALBUTEROL  SULFATE HFA 108 (90 BASE) MCG/ACT IN AERS Inhalation Inhale 2 puffs into the lungs every 6 (six) hours as needed.      . AZITHROMYCIN 250 MG PO TABS Oral Take 1 tablet (250 mg total) by mouth daily. Take first 2 tablets together, then 1 every day until finished. 6 tablet 0  . BENZONATATE 100 MG PO CAPS Oral Take 1 capsule (100 mg total) by mouth every 8 (eight) hours. 21 capsule 0  . BENZONATATE 200 MG PO CAPS Oral Take 1 capsule (200 mg total) by mouth 3 (three) times daily as needed. 20 capsule 1  . CALCIUM CITRATE-VITAMIN D 250-100 MG-UNIT PO TABS Oral Take 1 tablet by mouth 2 (two) times daily.      Marland Kitchen DEXTROMETHORPHAN HBR 20 MG/15ML PO SYRP Oral Take 15 mLs (20 mg total) by mouth at bedtime as needed (fro cough). 840 mL 0  . DICLOFENAC SODIUM 75 MG PO TBEC Oral Take 1 tablet (75 mg total) by mouth 2 (two) times daily as needed. 60 tablet 3  . FLUTICASONE PROPIONATE 50 MCG/ACT NA SUSP Nasal 2 sprays by Nasal route daily.      Marland Kitchen FOLIC ACID 1 MG PO TABS Oral Take 1 mg by mouth daily.      Marland Kitchen HYDROCODONE-ACETAMINOPHEN 5-500 MG PO TABS Oral Take 1 tablet by mouth every 6 (six) hours as needed.  60 tablet 0  . HYDROXYCHLOROQUINE SULFATE 200 MG PO TABS Oral Take 1 tablet by mouth 2 (two) times daily.    . IBANDRONATE SODIUM 150 MG PO TABS Oral Take 1 tablet (150 mg total) by mouth every 30 (thirty) days. Take in the morning with a full glass of water, on an empty stomach, and do not take anything else by mouth or lie down for the next 30 min. 1 tablet 1  . IBUPROFEN 200 MG PO TABS Oral Take 200 mg by mouth every 6 (six) hours as needed.      Marland Kitchen LORATADINE 10 MG PO TABS Oral Take 1 tablet (10 mg total) by mouth daily. 90 tablet 2  . MULTIVITAMINS PO CAPS Oral Take 1 capsule by mouth daily.      Marland Kitchen NEXIUM 40 MG PO CPDR Oral Take 40 mg by mouth 2 (two) times daily before a meal.     . PANTOPRAZOLE SODIUM 40 MG PO TBEC Oral Take 1 tablet (40 mg total) by mouth daily. 30 tablet 11  . PREDNISONE 2.5 MG PO TABS  Oral Take 1 tablet (2.5 mg total) by mouth daily. 30 tablet 4  . RANITIDINE HCL 300 MG PO TABS Oral Take 1 tablet (300 mg total) by mouth at bedtime. 30 tablet 3    BP 108/72  Pulse 107  Temp 98.1 F (36.7 C) (Oral)  Resp 20  SpO2 100%  Physical Exam  ED Course  Procedures (including critical care time)  Labs Reviewed - No data to display No results found.   1. Bronchitis   2. URI (upper respiratory infection)       MDM          Randa Spike, MD 12/01/11 [phone]

## 2011-12-01 NOTE — Discharge Instructions (Signed)
Avoid caffeine and milk products. May take your hydrocodone at bedtime to help with cough. Follow up with your pcp

## 2011-12-26 ENCOUNTER — Ambulatory Visit: Payer: BC Managed Care – PPO | Admitting: Internal Medicine

## 2012-01-11 ENCOUNTER — Telehealth: Payer: Self-pay | Admitting: Internal Medicine

## 2012-01-11 ENCOUNTER — Other Ambulatory Visit: Payer: Self-pay | Admitting: *Deleted

## 2012-01-11 DIAGNOSIS — M069 Rheumatoid arthritis, unspecified: Secondary | ICD-10-CM

## 2012-01-11 MED ORDER — HYDROCODONE-ACETAMINOPHEN 5-500 MG PO TABS: 1.0000 | ORAL_TABLET | Freq: Four times a day (QID) | ORAL | Status: DC | PRN

## 2012-01-11 NOTE — Telephone Encounter (Signed)
Needs to be seen sooner - I am available 8/8 and 8/9 in afternoons and can fill this then - needs to come in with all active meds in hand

## 2012-01-11 NOTE — Telephone Encounter (Signed)
Last filled 11/10/11

## 2012-01-11 NOTE — Telephone Encounter (Signed)
Called, spoke with pt.  Advised [facility] will fill medication for her but would need to see her first with all active meds in hand.  Offered OV tomorrow or Friday evening.  Pt then stated that she is planning on going out of town this evening to see her mom.  States she has already called Dr. Rosemarie Ax office for rx but hadn't heard anything so she called here bc she wanted this medication prior to leaving incase she needs it.  Pt states she will call Dr. Rosemarie Ax office to see if they are going to fill medication.  She will call back here if needed.

## 2012-01-11 NOTE — Telephone Encounter (Signed)
I spoke with pt and she is requesting a refill on generic vicodin 5-500. This was last refilled by Dr. Gilford Rile 11/10/11 #60 x 0 refills. Pt states she takes this with the mucinex to help with her coughing. Pt last OV 09/21/11 and has pending appt for 02/23/12 with [facility]. Please advise Dr. Sherene Sires, thanks

## 2012-01-11 NOTE — Telephone Encounter (Addendum)
Pt got 60 on 6/7 OPC 60 on 3/13 OPC 40 on 2/11 from Dr Sherene Sires  Also just called [facility] office to get them to refill med which they haven't done.  Has contract 8/12 but no freq / quantity listed.   Called pt and explained that she can only get meds from our clinic. She understands and agrees. Will refill #60. Updated FYI with this violation

## 2012-01-11 NOTE — Telephone Encounter (Signed)
Rx called in 

## 2012-01-19 ENCOUNTER — Other Ambulatory Visit: Payer: Self-pay | Admitting: *Deleted

## 2012-01-19 MED ORDER — FLUTICASONE PROPIONATE 50 MCG/ACT NA SUSP: 2.0000 | Freq: Every day | NASAL | Status: DC

## 2012-01-20 ENCOUNTER — Ambulatory Visit: Payer: BC Managed Care – PPO | Admitting: Internal Medicine

## 2012-01-27 ENCOUNTER — Encounter (HOSPITAL_COMMUNITY): Payer: Self-pay | Admitting: Emergency Medicine

## 2012-01-27 ENCOUNTER — Emergency Department (HOSPITAL_COMMUNITY): Payer: BC Managed Care – PPO

## 2012-01-27 ENCOUNTER — Inpatient Hospital Stay (HOSPITAL_COMMUNITY)
Admission: EM | Admit: 2012-01-27 | Discharge: 2012-01-29 | DRG: 145 | Disposition: A | Discharge status: Home / Self Care | Payer: BC Managed Care – PPO | Attending: Internal Medicine | Admitting: Internal Medicine

## 2012-01-27 DIAGNOSIS — T7840XA Allergy, unspecified, initial encounter: Secondary | ICD-10-CM | POA: Diagnosis present

## 2012-01-27 DIAGNOSIS — I959 Hypotension, unspecified: Principal | ICD-10-CM | POA: Diagnosis present

## 2012-01-27 DIAGNOSIS — M069 Rheumatoid arthritis, unspecified: Secondary | ICD-10-CM | POA: Diagnosis present

## 2012-01-27 DIAGNOSIS — T368X5A Adverse effect of other systemic antibiotics, initial encounter: Secondary | ICD-10-CM | POA: Diagnosis present

## 2012-01-27 DIAGNOSIS — D509 Iron deficiency anemia, unspecified: Secondary | ICD-10-CM | POA: Diagnosis present

## 2012-01-27 DIAGNOSIS — A419 Sepsis, unspecified organism: Secondary | ICD-10-CM

## 2012-01-27 DIAGNOSIS — M810 Age-related osteoporosis without current pathological fracture: Secondary | ICD-10-CM | POA: Diagnosis present

## 2012-01-27 DIAGNOSIS — R05 Cough: Secondary | ICD-10-CM

## 2012-01-27 DIAGNOSIS — J329 Chronic sinusitis, unspecified: Secondary | ICD-10-CM | POA: Diagnosis present

## 2012-01-27 DIAGNOSIS — R000 Tachycardia, unspecified: Secondary | ICD-10-CM | POA: Diagnosis present

## 2012-01-27 DIAGNOSIS — D539 Nutritional anemia, unspecified: Secondary | ICD-10-CM

## 2012-01-27 DIAGNOSIS — R6510 Systemic inflammatory response syndrome (SIRS) of non-infectious origin without acute organ dysfunction: Secondary | ICD-10-CM | POA: Diagnosis present

## 2012-01-27 DIAGNOSIS — Z79899 Other long term (current) drug therapy: Secondary | ICD-10-CM

## 2012-01-27 HISTORY — DX: Rheumatoid arthritis, unspecified: M06.9

## 2012-01-27 LAB — POCT I-STAT, CHEM 8
Hemoglobin: 11.6 g/dL — ABNORMAL LOW (ref 12.0–15.0)
Sodium: 144 mEq/L (ref 135–145)
TCO2: 23 mmol/L (ref 0–100)

## 2012-01-27 MED ORDER — ACETAMINOPHEN 325 MG PO TABS
975.0000 mg | ORAL_TABLET | Freq: Once | ORAL | Status: AC
  Administered 2012-01-27: 975 mg via ORAL
  Filled 2012-01-27: qty 3

## 2012-01-27 MED ORDER — DIPHENHYDRAMINE HCL 25 MG PO CAPS
25.0000 mg | ORAL_CAPSULE | Freq: Once | ORAL | Status: AC
  Administered 2012-01-27: 25 mg via ORAL
  Filled 2012-01-27: qty 1

## 2012-01-27 MED ORDER — ONDANSETRON 4 MG PO TBDP
ORAL_TABLET | ORAL | Status: AC
  Administered 2012-01-27: 4 mg
  Filled 2012-01-27: qty 1

## 2012-01-27 MED ORDER — METHYLPREDNISOLONE SODIUM SUCC 125 MG IJ SOLR
125.0000 mg | Freq: Once | INTRAMUSCULAR | Status: AC
  Administered 2012-01-27: 125 mg via INTRAMUSCULAR
  Filled 2012-01-27: qty 2

## 2012-01-27 MED ORDER — SODIUM CHLORIDE 0.9 % IV BOLUS (SEPSIS)
1000.0000 mL | Freq: Once | INTRAVENOUS | Status: AC
  Administered 2012-01-27: 1000 mL via INTRAVENOUS

## 2012-01-27 MED ORDER — FAMOTIDINE 20 MG PO TABS
10.0000 mg | ORAL_TABLET | Freq: Once | ORAL | Status: AC
  Administered 2012-01-27: 10 mg via ORAL
  Filled 2012-01-27: qty 1

## 2012-01-27 NOTE — ED Notes (Addendum)
Pt reports taking abx-avelox took for a couple days, and then was unable to afford so restarted taking it today--and having itching- took benadryl earlier; also reports when took it, made her feel nauseated; no difficulty breathing, no noted swelling to back of throat; (pt had same symptoms when took med first time, and sx went away after stopping med); was on avelox for sinus infection and cough per pt

## 2012-01-27 NOTE — ED Provider Notes (Signed)
History     CSN: [id number]  Arrival date & time 01/27/12  1728   None     Chief Complaint  Patient presents with  . Medication Reaction    (Consider location/radiation/quality/duration/timing/severity/associated sxs/prior treatment) HPI  Patient complains of not feeling well. She had many nonspecific symptoms such as chills, nausea, chronic cough, likely a feeling cold. She states that she medication for the first time today and all the symptoms started after she took a. She denies having any difficulty breathing, wheezing, chest, shortness of breath, feeling is that her throat is closing. The medicine she took one Avelox for a sinus infection. The patient has significant past medical history which is positive for rheumatoid arthritis. Other associated symptoms at the time of taking the medication were changed and high. She is in no acute distress the vital signs are stable at this time.  Past Medical History  Diagnosis Date  . Systemic lupus erythematosus   . Personal history of other diseases of circulatory system   . Osteoporosis, unspecified     steroid induced  . Unspecified deficiency anemia     microcytic  . Rheumatoid arthritis     History reviewed. No pertinent past surgical history.  Family History  Problem Relation Age of Onset  . Liver cancer Maternal Grandmother   . Scleroderma Mother     History  Substance Use Topics  . Smoking status: Never Smoker   . Smokeless tobacco: Not on file  . Alcohol Use: No    OB History    Grav Para Term Preterm Abortions TAB SAB Ect Mult Living                  Review of Systems  All other systems reviewed and are negative.    Allergies  Avelox  Home Medications   Current Outpatient Rx  Name Route Sig Dispense Refill  . ACETAMINOPHEN ER 650 MG PO TBCR Oral Take 650 mg by mouth every 8 (eight) hours as needed. For pain    . ALBUTEROL SULFATE HFA 108 (90 BASE) MCG/ACT IN AERS Inhalation Inhale 2 puffs into the  lungs every 6 (six) hours as needed. For shortness of breath/wheezing    . BENZONATATE 200 MG PO CAPS Oral Take 200 mg by mouth 3 (three) times daily as needed. For cough    . CALCIUM CITRATE-VITAMIN D 250-100 MG-UNIT PO TABS Oral Take 1 tablet by mouth 2 (two) times daily.      Marland Kitchen DICLOFENAC SODIUM 75 MG PO TBEC Oral Take 75 mg by mouth 2 (two) times daily as needed. For pain    . FLUTICASONE PROPIONATE 50 MCG/ACT NA SUSP Nasal Place 2 sprays into the nose daily. 16 g 1  . FOLIC ACID 1 MG PO TABS Oral Take 1 mg by mouth daily.      Marland Kitchen HYDROCODONE-ACETAMINOPHEN 5-500 MG PO TABS Oral Take 1 tablet by mouth every 6 (six) hours as needed. For pain    . HYDROXYCHLOROQUINE SULFATE 200 MG PO TABS Oral Take 1 tablet by mouth 2 (two) times daily.    . IBANDRONATE SODIUM 150 MG PO TABS Oral Take 1 tablet (150 mg total) by mouth every 30 (thirty) days. Take in the morning with a full glass of water, on an empty stomach, and do not take anything else by mouth or lie down for the next 30 min. 1 tablet 1  . IBUPROFEN 200 MG PO TABS Oral Take 200 mg by mouth every 6 (six) hours  as needed. For pain    . LORATADINE 10 MG PO TABS Oral Take 1 tablet (10 mg total) by mouth daily. 90 tablet 2  . MULTIVITAMINS PO CAPS Oral Take 1 capsule by mouth daily.      Marland Kitchen NEXIUM 40 MG PO CPDR Oral Take 40 mg by mouth 2 (two) times daily before a meal.     . PANTOPRAZOLE SODIUM 40 MG PO TBEC Oral Take 1 tablet (40 mg total) by mouth daily. 30 tablet 11  . PREDNISONE 2.5 MG PO TABS Oral Take 1 tablet (2.5 mg total) by mouth daily. 30 tablet 4  . RANITIDINE HCL 300 MG PO TABS Oral Take 1 tablet (300 mg total) by mouth at bedtime. 30 tablet 3    BP 99/54  Pulse 114  Temp 98.7 F (37.1 C) (Oral)  Resp 16  SpO2 98%  Physical Exam  Nursing note and vitals reviewed. Constitutional: She appears well-developed and well-nourished. No distress.  HENT:  Head: Normocephalic and atraumatic.  Eyes: Pupils are equal, round, and reactive  to light.  Neck: Normal range of motion. Neck supple.  Cardiovascular: Normal rate and regular rhythm.   Pulmonary/Chest: Effort normal. No respiratory distress. She has no wheezes. She has no rales. She exhibits no tenderness.  Abdominal: Soft. There is no tenderness. There is no rebound and no guarding.  Neurological: She is alert. GCS eye subscore is 4. GCS verbal subscore is 5. GCS motor subscore is 6.  Skin: Skin is warm and dry.    ED Course  Procedures (including critical care time)  Labs Reviewed  POCT I-STAT, CHEM 8 - Abnormal; Notable for the following:    Creatinine, Ser 1.50 (*)     Glucose, Bld 120 (*)     Hemoglobin 11.6 (*)     HCT 34.0 (*)     All other components within normal limits  CBC WITH DIFFERENTIAL - Abnormal; Notable for the following:    Hemoglobin 9.2 (*)  DELTA CHECK NOTED   HCT 28.5 (*)     MCV 69.5 (*)     MCH 22.4 (*)     Neutrophils Relative 91 (*)     Lymphocytes Relative 2 (*)     Neutro Abs 9.4 (*)     Lymphs Abs 0.2 (*)     All other components within normal limits  COMPREHENSIVE METABOLIC PANEL - Abnormal; Notable for the following:    Glucose, Bld 120 (*)     Creatinine, Ser 1.12 (*)     Calcium 7.9 (*)     Albumin 3.0 (*)     GFR calc non Af Amer 55 (*)     GFR calc Af Amer 64 (*)     All other components within normal limits  LACTIC ACID, PLASMA  PROCALCITONIN  CULTURE, BLOOD (ROUTINE X 2)  CULTURE, BLOOD (ROUTINE X 2)   Dg Chest 2 View  01/27/2012  *RADIOLOGY REPORT*  Clinical Data: 51 year old female with weakness vomiting chills.  CHEST - 2 VIEW  Comparison: 09/21/2011 and earlier.  Findings: Stable lung volumes.  Chronic bibasilar reticulonodular density which is not significantly changed over this series of exams since 2012.  No superimposed acute pulmonary opacity identified.  No pneumothorax, pulmonary edema or effusion.  Cardiac size and mediastinal contours are within normal limits.  Visualized tracheal air column is within  normal limits.  No acute osseous abnormality identified.  IMPRESSION: Stable chronic changes at the lung bases.  No new cardiopulmonary abnormality.  Original Report Authenticated By: Harley Hallmark, M.D.      1. Sepsis       MDM  Patient noted to be dehydrated as her blood pressure went down and her pulse began to become elevated during the fast track. It is noted that her creatinine was elevated this is most likely DVT dehydration on the patient is on medications that could be affecting her kidneys. Patient will be placed on an antibiotic for her sinusitis as well as some medications for the allergic reaction. Patient has been advised to have her primary care doctor recheck her kidney function in 4-5 days. I discussed the case with Dr. Lorenso Courier who states that the lungs are vital signs are stable and no other abnormalities and the patient looks well she should be able to discharge with PCP followup   At discharge patient was found to have become tachycardic, hypotensive and febrile.  Therefore at 2 L of normal saline given as well as 1 g of Tylenol to see how patient responded. Patients blood pressure did not respond and she continues to be tachycardic. She also has a low grade fever.  Therefore  I am admitting her for early sepsis.  Dr. Lovell Sheehan has agreed to admit. Pt to step down bed, blood cultures, calcitonin, procalcitonin. Maxillofacial CT added on as well. Pt admitted to Step-down         Dorthula Matas, [place] 01/28/12 [phone]

## 2012-01-28 ENCOUNTER — Encounter (HOSPITAL_COMMUNITY): Payer: Self-pay | Admitting: Internal Medicine

## 2012-01-28 DIAGNOSIS — R000 Tachycardia, unspecified: Secondary | ICD-10-CM | POA: Diagnosis present

## 2012-01-28 DIAGNOSIS — A419 Sepsis, unspecified organism: Secondary | ICD-10-CM

## 2012-01-28 DIAGNOSIS — M810 Age-related osteoporosis without current pathological fracture: Secondary | ICD-10-CM

## 2012-01-28 DIAGNOSIS — T7840XA Allergy, unspecified, initial encounter: Secondary | ICD-10-CM | POA: Diagnosis present

## 2012-01-28 DIAGNOSIS — Z888 Allergy status to other drugs, medicaments and biological substances status: Secondary | ICD-10-CM

## 2012-01-28 DIAGNOSIS — I959 Hypotension, unspecified: Secondary | ICD-10-CM | POA: Diagnosis present

## 2012-01-28 DIAGNOSIS — D539 Nutritional anemia, unspecified: Secondary | ICD-10-CM

## 2012-01-28 DIAGNOSIS — R6510 Systemic inflammatory response syndrome (SIRS) of non-infectious origin without acute organ dysfunction: Secondary | ICD-10-CM | POA: Diagnosis present

## 2012-01-28 DIAGNOSIS — M069 Rheumatoid arthritis, unspecified: Secondary | ICD-10-CM

## 2012-01-28 LAB — COMPREHENSIVE METABOLIC PANEL
AST: 37 U/L (ref 0–37)
Albumin: 3 g/dL — ABNORMAL LOW (ref 3.5–5.2)
Chloride: 109 mEq/L (ref 96–112)
Creatinine, Ser: 1.12 mg/dL — ABNORMAL HIGH (ref 0.50–1.10)
Potassium: 3.7 mEq/L (ref 3.5–5.1)
Total Bilirubin: 0.3 mg/dL (ref 0.3–1.2)

## 2012-01-28 LAB — CBC WITH DIFFERENTIAL/PLATELET
Eosinophils Relative: 0 % (ref 0–5)
Lymphocytes Relative: 2 % — ABNORMAL LOW (ref 12–46)
Monocytes Relative: 7 % (ref 3–12)
Neutrophils Relative %: 91 % — ABNORMAL HIGH (ref 43–77)
Platelets: 168 10*3/uL (ref 150–400)
RBC: 4.1 MIL/uL (ref 3.87–5.11)
WBC Morphology: INCREASED
WBC: 10.3 10*3/uL (ref 4.0–10.5)

## 2012-01-28 LAB — BASIC METABOLIC PANEL
CO2: 17 mEq/L — ABNORMAL LOW (ref 19–32)
Calcium: 7.7 mg/dL — ABNORMAL LOW (ref 8.4–10.5)
Chloride: 111 mEq/L (ref 96–112)
Glucose, Bld: 137 mg/dL — ABNORMAL HIGH (ref 70–99)
Potassium: 4 mEq/L (ref 3.5–5.1)
Sodium: 140 mEq/L (ref 135–145)

## 2012-01-28 LAB — PROCALCITONIN: Procalcitonin: 14.36 ng/mL

## 2012-01-28 LAB — CBC
HCT: 28.5 % — ABNORMAL LOW (ref 36.0–46.0)
Hemoglobin: 9.1 g/dL — ABNORMAL LOW (ref 12.0–15.0)
MCHC: 31.9 g/dL (ref 30.0–36.0)

## 2012-01-28 LAB — IRON AND TIBC
Saturation Ratios: 7 % — ABNORMAL LOW (ref 20–55)
UIBC: 201 ug/dL (ref 125–400)

## 2012-01-28 LAB — FOLATE: Folate: 13.5 ng/mL

## 2012-01-28 LAB — URINALYSIS, ROUTINE W REFLEX MICROSCOPIC
Glucose, UA: NEGATIVE mg/dL
Hgb urine dipstick: NEGATIVE
Protein, ur: NEGATIVE mg/dL
Specific Gravity, Urine: 1.012 (ref 1.005–1.030)

## 2012-01-28 LAB — RETICULOCYTES: RBC.: 4.04 MIL/uL (ref 3.87–5.11)

## 2012-01-28 MED ORDER — DEXTROSE 5 % IV SOLN
2.0000 g | Freq: Once | INTRAVENOUS | Status: AC
  Administered 2012-01-28: 2 g via INTRAVENOUS
  Filled 2012-01-28: qty 2

## 2012-01-28 MED ORDER — OXYCODONE HCL 5 MG PO TABS
5.0000 mg | ORAL_TABLET | ORAL | Status: DC | PRN
  Administered 2012-01-28: 5 mg via ORAL
  Filled 2012-01-28: qty 1

## 2012-01-28 MED ORDER — HYDROXYCHLOROQUINE SULFATE 200 MG PO TABS
200.0000 mg | ORAL_TABLET | Freq: Two times a day (BID) | ORAL | Status: DC
  Administered 2012-01-28 – 2012-01-29 (×3): 200 mg via ORAL
  Filled 2012-01-28 (×4): qty 1

## 2012-01-28 MED ORDER — ALBUTEROL SULFATE HFA 108 (90 BASE) MCG/ACT IN AERS: 2.0000 | INHALATION_SPRAY | Freq: Four times a day (QID) | RESPIRATORY_TRACT | Status: DC | PRN

## 2012-01-28 MED ORDER — ZOLPIDEM TARTRATE 5 MG PO TABS: 5.0000 mg | ORAL_TABLET | Freq: Every evening | ORAL | Status: DC | PRN

## 2012-01-28 MED ORDER — PANTOPRAZOLE SODIUM 40 MG PO TBEC: 40.0000 mg | DELAYED_RELEASE_TABLET | Freq: Every day | ORAL | Status: DC

## 2012-01-28 MED ORDER — FOLIC ACID 1 MG PO TABS
1.0000 mg | ORAL_TABLET | Freq: Every day | ORAL | Status: DC
  Administered 2012-01-28 – 2012-01-29 (×2): 1 mg via ORAL
  Filled 2012-01-28 (×2): qty 1

## 2012-01-28 MED ORDER — WHITE PETROLATUM GEL
Status: AC
  Administered 2012-01-28: 0.2
  Filled 2012-01-28: qty 5

## 2012-01-28 MED ORDER — DEXTROSE 5 % IV SOLN
1.0000 g | INTRAVENOUS | Status: DC
  Filled 2012-01-28: qty 10

## 2012-01-28 MED ORDER — ONDANSETRON HCL 4 MG PO TABS: 4.0000 mg | ORAL_TABLET | Freq: Four times a day (QID) | ORAL | Status: DC | PRN

## 2012-01-28 MED ORDER — DICLOFENAC SODIUM 75 MG PO TBEC: 75.0000 mg | DELAYED_RELEASE_TABLET | Freq: Two times a day (BID) | ORAL | Status: DC | PRN

## 2012-01-28 MED ORDER — ALBUTEROL SULFATE HFA 108 (90 BASE) MCG/ACT IN AERS: 2.0000 | INHALATION_SPRAY | RESPIRATORY_TRACT | Status: DC | PRN

## 2012-01-28 MED ORDER — BENZONATATE 100 MG PO CAPS
200.0000 mg | ORAL_CAPSULE | Freq: Three times a day (TID) | ORAL | Status: DC | PRN
  Administered 2012-01-29: 200 mg via ORAL
  Filled 2012-01-28: qty 2

## 2012-01-28 MED ORDER — ONDANSETRON HCL 4 MG/2ML IJ SOLN: 4.0000 mg | Freq: Four times a day (QID) | INTRAMUSCULAR | Status: DC | PRN

## 2012-01-28 MED ORDER — PREDNISONE 50 MG PO TABS
60.0000 mg | ORAL_TABLET | Freq: Every day | ORAL | Status: AC
  Administered 2012-01-28: 60 mg via ORAL
  Filled 2012-01-28: qty 1

## 2012-01-28 MED ORDER — ADULT MULTIVITAMIN W/MINERALS CH
1.0000 | ORAL_TABLET | Freq: Every day | ORAL | Status: DC
  Administered 2012-01-28 – 2012-01-29 (×2): 1 via ORAL
  Filled 2012-01-28 (×2): qty 1

## 2012-01-28 MED ORDER — CALCIUM CARBONATE-VITAMIN D 500-200 MG-UNIT PO TABS
1.0000 | ORAL_TABLET | Freq: Two times a day (BID) | ORAL | Status: DC
  Administered 2012-01-28 – 2012-01-29 (×3): 1 via ORAL
  Filled 2012-01-28 (×4): qty 1

## 2012-01-28 MED ORDER — SODIUM CHLORIDE 0.9 % IV SOLN
INTRAVENOUS | Status: DC
  Administered 2012-01-28: 100 mL/h via INTRAVENOUS
  Administered 2012-01-28: 02:00:00 via INTRAVENOUS
  Administered 2012-01-28: 125 mL/h via INTRAVENOUS

## 2012-01-28 MED ORDER — SODIUM CHLORIDE 0.9 % IV SOLN
INTRAVENOUS | Status: DC
  Administered 2012-01-28: 06:00:00 via INTRAVENOUS
  Administered 2012-01-28: 125 mL/h via INTRAVENOUS

## 2012-01-28 MED ORDER — ACETAMINOPHEN 325 MG PO TABS
650.0000 mg | ORAL_TABLET | Freq: Four times a day (QID) | ORAL | Status: DC | PRN
  Administered 2012-01-29: 650 mg via ORAL
  Filled 2012-01-28: qty 2

## 2012-01-28 MED ORDER — ENOXAPARIN SODIUM 40 MG/0.4ML ~~LOC~~ SOLN
40.0000 mg | SUBCUTANEOUS | Status: DC
  Administered 2012-01-28 – 2012-01-29 (×2): 40 mg via SUBCUTANEOUS
  Filled 2012-01-28 (×2): qty 0.4

## 2012-01-28 MED ORDER — HYDROMORPHONE HCL PF 1 MG/ML IJ SOLN: 0.5000 mg | INTRAMUSCULAR | Status: DC | PRN

## 2012-01-28 MED ORDER — MULTIVITAMINS PO CAPS: 1.0000 | ORAL_CAPSULE | Freq: Every day | ORAL | Status: DC

## 2012-01-28 MED ORDER — CALCIUM CITRATE-VITAMIN D 250-100 MG-UNIT PO TABS: 1.0000 | ORAL_TABLET | Freq: Two times a day (BID) | ORAL | Status: DC

## 2012-01-28 MED ORDER — FAMOTIDINE 20 MG PO TABS
20.0000 mg | ORAL_TABLET | Freq: Two times a day (BID) | ORAL | Status: DC
  Administered 2012-01-28 – 2012-01-29 (×2): 20 mg via ORAL
  Filled 2012-01-28 (×3): qty 1

## 2012-01-28 MED ORDER — PREDNISONE 50 MG PO TABS
50.0000 mg | ORAL_TABLET | Freq: Every day | ORAL | Status: AC
  Administered 2012-01-29: 50 mg via ORAL
  Filled 2012-01-28: qty 1

## 2012-01-28 MED ORDER — DIPHENHYDRAMINE HCL 50 MG/ML IJ SOLN
25.0000 mg | Freq: Four times a day (QID) | INTRAMUSCULAR | Status: AC
  Administered 2012-01-28 – 2012-01-29 (×4): 25 mg via INTRAVENOUS
  Filled 2012-01-28: qty 0.5
  Filled 2012-01-28: qty 1
  Filled 2012-01-28 (×2): qty 0.5

## 2012-01-28 MED ORDER — ACETAMINOPHEN 650 MG RE SUPP: 650.0000 mg | Freq: Four times a day (QID) | RECTAL | Status: DC | PRN

## 2012-01-28 MED ORDER — ALUM & MAG HYDROXIDE-SIMETH 200-200-20 MG/5ML PO SUSP
30.0000 mL | Freq: Four times a day (QID) | ORAL | Status: DC | PRN
Start: 2012-01-28 — End: 2012-01-29

## 2012-01-28 MED ORDER — FAMOTIDINE IN NACL 20-0.9 MG/50ML-% IV SOLN
20.0000 mg | Freq: Two times a day (BID) | INTRAVENOUS | Status: DC
  Administered 2012-01-28: 20 mg via INTRAVENOUS
  Filled 2012-01-28 (×2): qty 50

## 2012-01-28 NOTE — H&P (Signed)
[facility] History and Physical  Jamie Cox JYN:[id number] DOB: [birth year] DOA: 01/27/2012  Referring physician: EDP PCP: Debe Coder, MD   Chief Complaint: Fever,and Allergic Reaction   HPI: Jamie Cox is a 51 y.o. female with a history of Rheumatoid Arthritis who presents to the ED with complaints of fevers and chills, and itching all over since she had taken 1 dose of Avelox which had been prescribed for a 3 week history of Sinusitis.  She reports having cough, nasal congestion and the nasal drainage has been yellow.  She began to have fevers and chills.  In the ED she was found to have hypotension and tachycardia.  She was administered 3 liters of IV Fluids but her blood pressure only minimally improved.     Review of Systems: The patient denies anorexia, weight loss, vision loss, decreased hearing, hoarseness, chest pain, syncope, dyspnea on exertion, peripheral edema, balance deficits, hemoptysis, abdominal pain, melena, hematochezia, severe indigestion/heartburn, hematuria, incontinence, genital sores, suspicious skin lesions, transient blindness, difficulty walking, depression, unusual weight change, abnormal bleeding, enlarged lymph nodes, angioedema, and breast masses.    Past Medical History  Diagnosis Date  . Rheumatoid arthritis   . Personal history of other diseases of circulatory system   . Osteoporosis, unspecified     steroid induced  . Unspecified deficiency anemia     microcytic   History reviewed. No pertinent past surgical history.   Prior to Admission medications   Medication Sig Start Date End Date Taking? Authorizing Provider  acetaminophen (TYLENOL) 650 MG CR tablet Take 650 mg by mouth every 8 (eight) hours as needed. For pain   Yes Historical Provider, MD  albuterol (PROVENTIL HFA;VENTOLIN HFA) 108 (90 BASE) MCG/ACT inhaler Inhale 2 puffs into the lungs every 6 (six) hours as needed. For shortness of breath/wheezing   Yes Historical Provider, MD   benzonatate (TESSALON) 200 MG capsule Take 200 mg by mouth 3 (three) times daily as needed. For cough 09/06/10  Yes Darnelle Maffucci, MD  calcium-vitamin D 250-100 MG-UNIT per tablet Take 1 tablet by mouth 2 (two) times daily.     Yes Historical Provider, MD  diclofenac (VOLTAREN) 75 MG EC tablet Take 75 mg by mouth 2 (two) times daily as needed. For pain 08/18/11  Yes Darnelle Maffucci, MD  fluticasone [facility]) 50 MCG/ACT nasal spray Place 2 sprays into the nose daily. 01/19/12  Yes Inez Catalina, MD  folic acid (FOLVITE) 1 MG tablet Take 1 mg by mouth daily.     Yes Historical Provider, MD  HYDROcodone-acetaminophen (VICODIN) 5-500 MG per tablet Take 1 tablet by mouth every 6 (six) hours as needed. For pain 01/11/12  Yes Burns Spain, MD  hydroxychloroquine (PLAQUENIL) 200 MG tablet Take 1 tablet by mouth 2 (two) times daily. 09/16/10  Yes Historical Provider, MD  ibandronate (BONIVA) 150 MG tablet Take 1 tablet (150 mg total) by mouth every 30 (thirty) days. Take in the morning with a full glass of water, on an empty stomach, and do not take anything else by mouth or lie down for the next 30 min. 11/10/11  Yes Darnelle Maffucci, MD  ibuprofen (ADVIL,MOTRIN) 200 MG tablet Take 200 mg by mouth every 6 (six) hours as needed. For pain   Yes Historical Provider, MD  loratadine (CLARITIN) 10 MG tablet Take 1 tablet (10 mg total) by mouth daily. 04/05/11  Yes Darnelle Maffucci, MD  Multiple Vitamin (MULTIVITAMIN) capsule Take 1 capsule by mouth daily.     Yes Historical Provider,  MD  NEXIUM 40 MG capsule Take 40 mg by mouth 2 (two) times daily before a meal.  08/10/10  Yes Historical Provider, MD  pantoprazole (PROTONIX) 40 MG tablet Take 1 tablet (40 mg total) by mouth daily. 09/21/11  Yes Nyoka Cowden, MD  predniSONE (DELTASONE) 2.5 MG tablet Take 1 tablet (2.5 mg total) by mouth daily. 08/18/11  Yes Darnelle Maffucci, MD  ranitidine (ZANTAC) 300 MG tablet Take 1 tablet (300 mg total) by mouth at bedtime. 11/10/11  Yes  Darnelle Maffucci, MD     Allergies  Allergen Reactions  . Avelox (Moxifloxacin Hcl In Nacl) Diarrhea, Nausea And Vomiting and Other (See Comments)    Dizziness, Cold chills      Social History:  reports that she has never smoked. She does not have any smokeless tobacco history on file. She reports that she does not drink alcohol or use illicit drugs.    Family History  Problem Relation Age of Onset  . Liver cancer Maternal Grandmother   . Scleroderma Mother   . Prostate cancer Father   . Lung cancer Paternal Uncle   . Coronary artery disease Paternal Grandmother   . Hypertension Paternal Grandmother      Physical Exam:    GEN:  Pleasant  51 year old well nourished and well developed African American Female examined  and in no acute distress; cooperative with exam  Filed Vitals:   01/28/12 0110 01/28/12 0220 01/28/12 0322 01/28/12 0331  BP: 88/56 99/54 86/49  94/55  Pulse:  114    Temp:  98.7 F (37.1 C) 98.3 F (36.8 C)   TempSrc:  Oral Oral   Resp:  16 26   SpO2:   99%      Filed Vitals:   01/28/12 0110 01/28/12 0220 01/28/12 0322 01/28/12 0331  BP: 88/56 99/54 86/49  94/55  Pulse:  114    Temp:  98.7 F (37.1 C) 98.3 F (36.8 C)   TempSrc:  Oral Oral   Resp:  16 26   SpO2:   99%    Blood pressure 94/55, pulse 114, temperature 98.3 F (36.8 C), temperature source Oral, resp. rate 26, SpO2 99.00%. PSYCH: She is alert and oriented x4; does not appear anxious does not appear depressed; affect is normal HEENT: Normocephalic and Atraumatic, Mucous membranes pink; PERRLA; EOM intact; Fundi:  Benign;  No scleral icterus, Nares: Patent, Oropharynx: Clear, Sparse  Dentition, Neck:  FROM, no cervical lymphadenopathy nor thyromegaly or carotid bruit; no JVD; Breasts:: Not examined CHEST WALL: No tenderness CHEST: Normal respiration, clear to auscultation bilaterally HEART: Regular rate and rhythm; no murmurs rubs or gallops BACK: No kyphosis or scoliosis; no CVA  tenderness ABDOMEN: Positive Bowel Sounds, soft non-tender; no masses, no organomegaly. Rectal Exam: Not done EXTREMITIES: No bone or joint deformity; age-appropriate arthropathy of the hands and knees; no cyanosis, clubbing or edema; no ulcerations. Genitalia: not examined PULSES: 2+ and symmetric SKIN: Normal hydration no rash or ulceration CNS: Cranial nerves 2-12 grossly intact no focal neurologic deficit    Labs on Admission:  Basic Metabolic Panel:  Lab 01/28/12 1610 01/27/12 2220  NA 140 144  K 3.7 3.7  CL 109 108  CO2 20 --  GLUCOSE 120* 120*  BUN 16 16  CREATININE 1.12* 1.50*  CALCIUM 7.9* --  MG -- --  PHOS -- --   Liver Function Tests:  Lab 01/28/12 0104  AST 37  ALT 34  ALKPHOS 58  BILITOT 0.3  PROT 6.2  ALBUMIN 3.0*   No results found for this basename: LIPASE:5,AMYLASE:5 in the last 168 hours No results found for this basename: AMMONIA:5 in the last 168 hours CBC:  Lab 01/28/12 0104 01/27/12 2220  WBC 10.3 --  NEUTROABS 9.4* --  HGB 9.2* 11.6*  HCT 28.5* 34.0*  MCV 69.5* --  PLT 168 --   Cardiac Enzymes: No results found for this basename: CKTOTAL:5,CKMB:5,CKMBINDEX:5,TROPONINI:5 in the last 168 hours  BNP (last 3 results) No results found for this basename: PROBNP:3 in the last 8760 hours CBG: No results found for this basename: GLUCAP:5 in the last 168 hours  Radiological Exams on Admission: Dg Chest 2 View  01/27/2012  *RADIOLOGY REPORT*  Clinical Data: 51 year old female with weakness vomiting chills.  CHEST - 2 VIEW  Comparison: 09/21/2011 and earlier.  Findings: Stable lung volumes.  Chronic bibasilar reticulonodular density which is not significantly changed over this series of exams since 2012.  No superimposed acute pulmonary opacity identified.  No pneumothorax, pulmonary edema or effusion.  Cardiac size and mediastinal contours are within normal limits.  Visualized tracheal air column is within normal limits.  No acute osseous  abnormality identified.  IMPRESSION: Stable chronic changes at the lung bases.  No new cardiopulmonary abnormality.   Original Report Authenticated By: Harley Hallmark, M.D.        Assessment: Principal Problem:  *SIRS (systemic inflammatory response syndrome) Active Problems:  Hypotension  Tachycardia  Allergic drug reaction  ARTHRITIS, RHEUMATOID  OSTEOPOROSIS   Plan:    Admit to Stepdown  Bed [facility] X2 sent, IV Rocephin started IVFs for hypotension Prednisone taper, and IV benadryl for Allergic Reaction. Reconcile Home Medications DVT prophylaxis Send Anemia Panel due to Anemia    Code Status: FULL CODE Family Communication: Husband at Bedside Disposition Plan: Return Home.    Time spent: 60 minutes  Ron Stracke [facility] Pager [phone]  If 7PM-7AM, please contact night-coverage [email or web address] Password [email or web address] 01/28/2012, 4:10 AM

## 2012-01-28 NOTE — Progress Notes (Signed)
[facility] Progress Note [place] TEAM 1 - Stepdown/ICU TEAM   Hailly Fess YNW:[id number] DOB: 05-28-1961 DOA: 01/27/2012 PCP: Debe Coder, MD - [facility]  Brief narrative: 51 y.o. female with a history of Rheumatoid Arthritis who presented to the ED with complaints of fevers and chills, and itching all over since she had taken 1 dose of Avelox which had been prescribed for a 3 week history of Sinusitis. She reported having cough, nasal congestion and nasal drainage that had been yellow. She began to have fevers and chills. In the ED she was found to have hypotension and tachycardia. She was administered 3 liters of IV Fluids but her blood pressure only minimally improved.   Assessment/Plan:  Hypotension/tachycardia - possible allergic reaction to avelox - ? [facility] CXR is unremarkable - UA not checked so will send today  RA On chronic steroid therapy - see discussion below  Chronic cough/chronic sinusitis ? 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   Objective: Blood pressure 114/69, pulse 91, temperature 98.8 F (37.1 C), temperature source Oral, resp. rate 24, height 5\' 1"  (1.549 m), weight 55.2 kg (121 lb 11.1 oz), SpO2 98.00%.  Intake/Output Summary (Last 24 hours) at 01/28/12 1142 Last data filed at 01/28/12 0900  Gross per 24 hour  Intake 2973.75 ml  Output    601 ml  Net 2372.75 ml    Exam: A f/u exam is completed  Data Reviewed: Basic Metabolic  Panel:  Lab 01/28/12 0400 01/28/12 0104 01/27/12 2220  NA 140 140 144  K 4.0 3.7 3.7  CL 111 109 108  CO2 17* 20 --  GLUCOSE 137* 120* 120*  BUN 16 16 16   CREATININE 1.03 1.12* 1.50*  CALCIUM 7.7* 7.9* --  MG -- -- --  PHOS -- -- --   Liver Function Tests:  Lab 01/28/12 0104  AST 37  ALT 34  ALKPHOS 58  BILITOT 0.3  PROT 6.2  ALBUMIN 3.0*   CBC:  Lab 01/28/12 0400 01/28/12 0104 01/27/12 2220  WBC 10.9* 10.3 --  NEUTROABS -- 9.4* --  HGB 9.1* 9.2* 11.6*  HCT 28.5* 28.5* 34.0*  MCV 69.0* 69.5* --  PLT 186 168 --    Recent Results (from the past 240 hour(s))  MRSA PCR SCREENING     Status: Normal   Collection Time   01/28/12  5:00 AM      Component Value Range Status Comment   MRSA by PCR NEGATIVE  NEGATIVE Final      Studies:  Recent x-ray studies have been reviewed in detail by the Attending Physician  Scheduled Meds:  Reviewed in detail by the Attending Physician   Lonia Blood, MD [facility] Office  [phone] Pager [phone]  On-Call/Text Page:      [email or web address]      password [email or web address]  If 7PM-7AM, please contact night-coverage [email or web address] Password [email or web address] 01/28/2012, 11:42 AM   LOS: 1 day

## 2012-01-29 DIAGNOSIS — R05 Cough: Secondary | ICD-10-CM

## 2012-01-29 LAB — BASIC METABOLIC PANEL
BUN: 15 mg/dL (ref 6–23)
Calcium: 7.3 mg/dL — ABNORMAL LOW (ref 8.4–10.5)
Creatinine, Ser: 0.86 mg/dL (ref 0.50–1.10)
GFR calc non Af Amer: 76 mL/min — ABNORMAL LOW (ref >90)
Glucose, Bld: 117 mg/dL — ABNORMAL HIGH (ref 70–99)

## 2012-01-29 MED ORDER — PREDNISONE 10 MG PO TABS: ORAL_TABLET | ORAL | Status: DC

## 2012-01-29 NOTE — Progress Notes (Signed)
Pt discharged home via wheelchair to car. Discharge Instructions given and pt verbalized understanding. Vitals stable. Pt pain free and has no complaints.

## 2012-01-29 NOTE — Discharge Summary (Signed)
DISCHARGE SUMMARY  Jamie Cox  MR#: [id number]  DOB:Sep 28, 1960  Date of Admission: 01/27/2012 Date of Discharge: 01/29/2012  Attending Physician:Jeidi Gilles T  Patient's WUJ:[id number], [name], MD  Consults: None   Disposition: d/c home  Follow-up Appts: Follow-up Information    Follow up with Jamie Coder, MD. (call to schedule an appointment within 3-5 days)    Contact information:   [address] [place] [address] [phone]         Discharge Diagnoses: Hypotension/tachycardia - possible allergic reaction to avelox  RA  Chronic cough/chronic sinusitis  Microcytic anemia   Initial presentation: 51 y.o. female with a history of Rheumatoid Arthritis who presented to the ED with complaints of fevers and chills, and itching all over since she had taken 1 dose of Avelox which had been prescribed for a 3 week history of Sinusitis. She reported having cough, nasal congestion and nasal drainage that had been yellow. She began to have fevers and chills. In the ED she was found to have hypotension and tachycardia. She was administered 3 liters of IV Fluids but her blood pressure only minimally improved.   Hospital Course:  Hypotension/tachycardia - possible allergic reaction to avelox   CXR is unremarkable - UA is unremarkable - sx have resolved - counseled patient to avoid future doses of Avelox and to inform all healthcare providers of probable allergy to Avelox - slow steroid taper with no plan to stop completely due to chronic dependence  RA  On chronic steroid therapy - see discussion below - no evidence clinically of acute flair  Chronic cough/chronic sinusitis  ? 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 For pain      albuterol 108 (90 BASE) MCG/ACT inhaler   Commonly known as: PROVENTIL HFA;VENTOLIN HFA   Inhale 2 puffs into the lungs every 6 (six) hours as needed. For shortness of breath/wheezing      benzonatate 200 MG capsule   Commonly known as: TESSALON   Take 200 mg by mouth 3 (three) times daily as needed. For cough      calcium-vitamin D 250-100 MG-UNIT per tablet   Take 1 tablet by mouth 2 (two) times daily.      diclofenac 75 MG EC tablet   Commonly known as: VOLTAREN   Take 75 mg by mouth 2 (two) times daily as needed. For pain      fluticasone 50 MCG/ACT nasal spray   Commonly known as: FLONASE   Place 2 sprays into the nose daily.      folic acid 1 MG tablet   Commonly known as: FOLVITE   Take 1 mg by mouth daily.      HYDROcodone-acetaminophen 5-500 MG per tablet   Commonly known as: VICODIN  Take 1 tablet by mouth every 6 (six) hours as needed. For pain      hydroxychloroquine 200 MG tablet   Commonly known as: PLAQUENIL   Take 1 tablet by mouth 2 (two) times daily.      ibandronate 150 MG tablet   Commonly known as: BONIVA   Take 1 tablet (150 mg total) by mouth every 30 (thirty) days. Take in the morning with a full glass of water, on an empty stomach, and do not take anything else by mouth or lie down for the next 30 min.      loratadine 10 MG tablet   Commonly known as: CLARITIN   Take 1 tablet (10 mg total) by mouth daily.      multivitamin capsule   Take 1 capsule by mouth daily.      pantoprazole 40 MG tablet   Commonly known as: PROTONIX   Take 1 tablet (40 mg total) by mouth daily.      predniSONE 10 MG tablet   Commonly known as:  DELTASONE   Take 2 tablets a day for 5 days, then take one tablet a day for 5 days, then take 1/2 (half) a tablet a day thereafter until further directed by your MD - DO NOT STOP TAKING PREDNISONE WITHOUT SPEAKING TO YOUR DOCTOR      ranitidine 300 MG tablet   Commonly known as: ZANTAC   Take 1 tablet (300 mg total) by mouth at bedtime.           Day of Discharge BP 136/84  Pulse 101  Temp 98.2 F (36.8 C) (Oral)  Resp 21  Ht 5\' 1"  (1.549 m)  Wt 58 kg (127 lb 13.9 oz)  BMI 24.16 kg/m2  SpO2 98%  Physical Exam: General: No acute respiratory distress Lungs: Clear to auscultation bilaterally without wheezes or crackles Cardiovascular: Regular rate and rhythm without murmur gallop or rub  Abdomen: Nontender, nondistended, soft, bowel sounds positive, no rebound, no ascites, no appreciable mass Extremities: No significant cyanosis, clubbing, or edema bilateral lower extremities  Results for orders placed during the hospital encounter of 01/27/12 (from the past 24 hour(s))  URINALYSIS, ROUTINE W REFLEX MICROSCOPIC     Status: Normal   Collection Time   01/28/12  4:21 PM      Component Value Range   Color, Urine YELLOW  YELLOW   APPearance CLEAR  CLEAR   Specific Gravity, Urine 1.012  1.005 - 1.030   pH 6.0  5.0 - 8.0   Glucose, UA NEGATIVE  NEGATIVE mg/dL   Hgb urine dipstick NEGATIVE  NEGATIVE   Bilirubin Urine NEGATIVE  NEGATIVE   Ketones, ur NEGATIVE  NEGATIVE mg/dL   Protein, ur NEGATIVE  NEGATIVE mg/dL   Urobilinogen, UA 0.2  0.0 - 1.0 mg/dL   Nitrite NEGATIVE  NEGATIVE   Leukocytes, UA NEGATIVE  NEGATIVE  BASIC METABOLIC PANEL     Status: Abnormal   Collection Time   01/29/12  4:20 AM      Component Value Range   Sodium 142  135 - 145 mEq/L   Potassium 4.1  3.5 - 5.1 mEq/L   Chloride 113 (*) 96 - 112 mEq/L   CO2 21  19 - 32 mEq/L   Glucose, Bld 117 (*) 70 - 99 mg/dL   BUN 15  6 - 23 mg/dL   Creatinine, Ser 1.61  0.50 - 1.10 mg/dL   Calcium 7.3 (*) 8.4 - 10.5  mg/dL   GFR calc  non Af Amer 76 (*) >90 mL/min   GFR calc Af Amer 88 (*) >90 mL/min   Time spent in discharge (includes decision making & examination of pt): 30 minutes  01/29/2012, 12:05 PM   Lonia Blood, MD [facility] Office  [phone] Pager [phone]  On-Call/Text Page:      [email or web address]      password [email or web address]    /c

## 2012-01-30 LAB — URINE CULTURE: Colony Count: 10000

## 2012-01-31 ENCOUNTER — Encounter: Payer: Self-pay | Admitting: Internal Medicine

## 2012-01-31 ENCOUNTER — Ambulatory Visit (INDEPENDENT_AMBULATORY_CARE_PROVIDER_SITE_OTHER): Payer: BC Managed Care – PPO | Admitting: Internal Medicine

## 2012-01-31 VITALS — BP 144/90 | HR 75 | Temp 97.8°F | Ht 61.0 in | Wt 113.1 lb

## 2012-01-31 DIAGNOSIS — Z1231 Encounter for screening mammogram for malignant neoplasm of breast: Secondary | ICD-10-CM

## 2012-01-31 DIAGNOSIS — J329 Chronic sinusitis, unspecified: Secondary | ICD-10-CM

## 2012-01-31 DIAGNOSIS — R030 Elevated blood-pressure reading, without diagnosis of hypertension: Secondary | ICD-10-CM

## 2012-01-31 DIAGNOSIS — D539 Nutritional anemia, unspecified: Secondary | ICD-10-CM

## 2012-01-31 DIAGNOSIS — Z1211 Encounter for screening for malignant neoplasm of colon: Secondary | ICD-10-CM

## 2012-01-31 DIAGNOSIS — D509 Iron deficiency anemia, unspecified: Secondary | ICD-10-CM

## 2012-01-31 DIAGNOSIS — IMO0001 Reserved for inherently not codable concepts without codable children: Secondary | ICD-10-CM

## 2012-01-31 DIAGNOSIS — Z1239 Encounter for other screening for malignant neoplasm of breast: Secondary | ICD-10-CM

## 2012-01-31 DIAGNOSIS — Z0000 Encounter for general adult medical examination without abnormal findings: Secondary | ICD-10-CM

## 2012-01-31 NOTE — Assessment & Plan Note (Signed)
Patient followed by Dr. Jenne Pane at ENT. She was recently treated with Avelox, with plan to treat for 6 weeks. But in the interim, she developed a presumed allergic reaction to the Avelox with symptoms of hypotension and tachycardia. She presents today for hospital followup, and all of her symptoms for which she was hospitalized have resolved. She continues to have sinusitis, and we'll likely need to proceed with sinus surgery, which she will discuss with her ENT.

## 2012-01-31 NOTE — Assessment & Plan Note (Signed)
Likely an outlier. No history of hypertension. Patient will followup in 3-6 months to continue to monitor.

## 2012-01-31 NOTE — Assessment & Plan Note (Signed)
Patient has had iron studies, and ferritin has been high in the past. Likely compounded by chronic disease. She is agreeable to colonoscopy. Referral made today.

## 2012-01-31 NOTE — Patient Instructions (Addendum)
-  I am glad to see you're feeling better!  I'll have you come back in 3-6 months to follow up with your primary care physician to monitor your blood pressure.    -Please be sure to have your mammogram done.  Please be sure to bring all of your medications with you to every visit.  Should you have any new or worsening symptoms, please be sure to call the clinic at [phone].

## 2012-01-31 NOTE — Assessment & Plan Note (Signed)
Referral for colonoscopy made today. Referral for mammogram made today.

## 2012-01-31 NOTE — Progress Notes (Signed)
Subjective:   Patient ID: Jamie Cox female   DOB: 1960/12/18 51 y.o.   MRN: [id number]  HPI: [name] is a 51 y.o. woman with history of rheumatoid arthritis and osteoporosis who presents today for hospital followup. She was discharged on 01/29/2012 after being admitted with complaints of fevers, chills, pruritus, hypotension, and tachycardia. Her symptoms were thought to be a possible allergic reaction to Avelox. Since hospital discharge, she has been feeling well. She continues her prednisone taper, but does not plan to taper completely off, she is chronically on prednisone 2.5 mg daily for her rheumatoid arthritis.  She denies symptoms of chest pain, dizziness, headache, vision changes, pruritus, shortness of breath, palpitations. She continues to have persistent sinus congestion, for which she is followed by ENT.  Past Medical History  Diagnosis Date  . Rheumatoid arthritis   . Personal history of other diseases of circulatory system   . Osteoporosis, unspecified     steroid induced  . Unspecified deficiency anemia     microcytic   Current Outpatient Prescriptions  Medication Sig Dispense Refill  . acetaminophen (TYLENOL) 650 MG CR tablet Take 650 mg by mouth every 8 (eight) hours as needed. For pain      . albuterol (PROVENTIL HFA;VENTOLIN HFA) 108 (90 BASE) MCG/ACT inhaler Inhale 2 puffs into the lungs every 6 (six) hours as needed. For shortness of breath/wheezing      . calcium-vitamin D 250-100 MG-UNIT per tablet Take 1 tablet by mouth 2 (two) times daily.        . diclofenac (VOLTAREN) 75 MG EC tablet Take 75 mg by mouth 2 (two) times daily as needed. For pain      . fluticasone (FLONASE) 50 MCG/ACT nasal spray Place 2 sprays into the nose daily.  16 g  1  . folic acid (FOLVITE) 1 MG tablet Take 1 mg by mouth daily.        Marland Kitchen HYDROcodone-acetaminophen (VICODIN) 5-500 MG per tablet Take 1 tablet by mouth every 6 (six) hours as needed. For pain      . hydroxychloroquine  (PLAQUENIL) 200 MG tablet Take 1 tablet by mouth 2 (two) times daily.      Marland Kitchen ibandronate (BONIVA) 150 MG tablet Take 1 tablet (150 mg total) by mouth every 30 (thirty) days. Take in the morning with a full glass of water, on an empty stomach, and do not take anything else by mouth or lie down for the next 30 min.  1 tablet  1  . loratadine (CLARITIN) 10 MG tablet Take 1 tablet (10 mg total) by mouth daily.  90 tablet  2  . Multiple Vitamin (MULTIVITAMIN) capsule Take 1 capsule by mouth daily.        . pantoprazole (PROTONIX) 40 MG tablet Take 1 tablet (40 mg total) by mouth daily.  30 tablet  11  . predniSONE (DELTASONE) 10 MG tablet Take 2 tablets a day for 5 days, then take one tablet a day for 5 days, then take 1/2 (half) a tablet a day thereafter until further directed by your MD - DO NOT STOP TAKING PREDNISONE WITHOUT SPEAKING TO YOUR DOCTOR  30 tablet  0  . ranitidine (ZANTAC) 300 MG tablet Take 1 tablet (300 mg total) by mouth at bedtime.  30 tablet  3  . benzonatate (TESSALON) 200 MG capsule Take 200 mg by mouth 3 (three) times daily as needed. For cough       Family History  Problem Relation Age of Onset  .  Liver cancer Maternal Grandmother   . Scleroderma Mother   . Prostate cancer Father   . Lung cancer Paternal Uncle   . Coronary artery disease Paternal Grandmother   . Hypertension Paternal Grandmother    History   Social History  . Marital Status: Married    Spouse Name: N/A    Number of Children: 2  . Years of Education: N/A   Occupational History  . Data processing manager    Social History Main Topics  . Smoking status: Never Smoker   . Smokeless tobacco: None  . Alcohol Use: No  . Drug Use: No  . Sexually Active: Not Currently   Other Topics Concern  . None   Social History Narrative  . None   Review of Systems: Constitutional: Denies fever, chills, diaphoresis, appetite change and fatigue.  HEENT: Denies photophobia, eye pain, redness, hearing loss, ear pain,  sore throat, rhinorrhea, sneezing, mouth sores, trouble swallowing, neck pain, neck stiffness and tinnitus.   Respiratory: Denies SOB, DOE, cough, chest tightness,  and wheezing.   Cardiovascular: Denies chest pain, palpitations and leg swelling.  Gastrointestinal: Denies nausea, vomiting, abdominal pain, diarrhea, constipation, blood in stool and abdominal distention.  Genitourinary: Denies dysuria, urgency, frequency, hematuria, flank pain and difficulty urinating.  Musculoskeletal: Denies myalgias, back pain, joint swelling, arthralgias and gait problem.  Skin: Denies pallor, rash and wound.  Neurological: Denies dizziness, seizures, syncope, weakness, light-headedness, numbness and headaches.   Psychiatric/Behavioral: Denies suicidal ideation, mood changes, confusion, nervousness, sleep disturbance and agitation  Objective:  Physical Exam: Filed Vitals:   01/31/12 1125 01/31/12 1202  BP: 151/95 144/90  Pulse: 76 75  Temp: 97.8 F (36.6 C)   TempSrc: Oral   Height: 5\' 1"  (1.549 m)   Weight: 113 lb 1.6 oz (51.302 kg)   SpO2: 99%    Constitutional: Vital signs reviewed.  Patient is a well-developed and well-nourished woman in no acute distress and cooperative with exam. Mouth: no erythema or exudates, MMM Eyes: PERRL, EOMI, conjunctivae normal, No scleral icterus.  Neck: Supple, Trachea midline normal ROM Cardiovascular: RRR, S1 normal, S2 normal, no MRG, pulses symmetric and intact bilaterally Pulmonary/Chest: CTAB, no wheezes, rales, or rhonchi Abdominal: Soft. Non-tender, non-distended, bowel sounds are normal, no masses, organomegaly, or guarding present.  Hematology: no cervical adenopathy.  Neurological: A&O x3, Strength is normal and symmetric bilaterally, cranial nerve II-XII are grossly intact, no focal motor deficit, sensory intact to light touch bilaterally.  Skin: Warm, dry and intact. No rash, cyanosis, or clubbing.  Psychiatric: Normal mood and affect. speech and  behavior is normal. Judgment and thought content normal. Cognition and memory are normal.   Assessment & Plan:  Case and care discussed with Dr. Eben Burow. Please see problem-oriented charting for further details. Patient to return in 3-6 months for routine followup and monitoring her blood pressure.

## 2012-02-02 ENCOUNTER — Ambulatory Visit (HOSPITAL_COMMUNITY)
Admission: RE | Admit: 2012-02-02 | Discharge: 2012-02-02 | Disposition: A | Discharge status: Home / Self Care | Payer: BC Managed Care – PPO | Source: Ambulatory Visit | Attending: Internal Medicine | Admitting: Internal Medicine

## 2012-02-02 DIAGNOSIS — Z1231 Encounter for screening mammogram for malignant neoplasm of breast: Secondary | ICD-10-CM | POA: Insufficient documentation

## 2012-02-03 ENCOUNTER — Other Ambulatory Visit: Payer: Self-pay | Admitting: Internal Medicine

## 2012-02-03 DIAGNOSIS — Z1239 Encounter for other screening for malignant neoplasm of breast: Secondary | ICD-10-CM

## 2012-02-03 LAB — CULTURE, BLOOD (ROUTINE X 2): Culture: NO GROWTH

## 2012-02-14 ENCOUNTER — Ambulatory Visit
Admission: RE | Admit: 2012-02-14 | Discharge: 2012-02-14 | Disposition: A | Discharge status: Home / Self Care | Payer: BC Managed Care – PPO | Source: Ambulatory Visit | Attending: Internal Medicine | Admitting: Internal Medicine

## 2012-02-14 ENCOUNTER — Other Ambulatory Visit: Payer: Self-pay | Admitting: Internal Medicine

## 2012-02-14 ENCOUNTER — Other Ambulatory Visit: Payer: Self-pay | Admitting: *Deleted

## 2012-02-14 DIAGNOSIS — Z1239 Encounter for other screening for malignant neoplasm of breast: Secondary | ICD-10-CM

## 2012-02-14 MED ORDER — IBANDRONATE SODIUM 150 MG PO TABS: 150.0000 mg | ORAL_TABLET | ORAL | Status: DC

## 2012-02-14 MED ORDER — LORATADINE 10 MG PO TABS: 10.0000 mg | ORAL_TABLET | Freq: Every day | ORAL | Status: DC

## 2012-02-14 NOTE — Telephone Encounter (Signed)
done

## 2012-02-14 NOTE — Telephone Encounter (Signed)
Rx called in 

## 2012-02-23 ENCOUNTER — Ambulatory Visit: Payer: BC Managed Care – PPO | Admitting: Internal Medicine

## 2012-03-22 ENCOUNTER — Ambulatory Visit (INDEPENDENT_AMBULATORY_CARE_PROVIDER_SITE_OTHER): Payer: BC Managed Care – PPO | Admitting: Internal Medicine

## 2012-03-22 ENCOUNTER — Encounter: Payer: Self-pay | Admitting: Internal Medicine

## 2012-03-22 VITALS — BP 120/78 | HR 117 | Temp 98.6°F | Ht 61.0 in | Wt 115.0 lb

## 2012-03-22 DIAGNOSIS — M0510 Rheumatoid lung disease with rheumatoid arthritis of unspecified site: Secondary | ICD-10-CM

## 2012-03-22 DIAGNOSIS — R05 Cough: Secondary | ICD-10-CM

## 2012-03-22 LAB — PULMONARY FUNCTION TEST

## 2012-03-22 NOTE — Assessment & Plan Note (Signed)
-   CT 12/24/10 Stable right lower lobe 7 mm pulmonary nodule.  2. Improved chronic interstitial lung disease with differential  considerations discussed above. Favor cryptogenic organizing  pneumonia or sarcoidosis.  - PFT's 12/23/2010 VC   2.13 (73%) DLCO 56 corrects to 89%  - PFT's 03/22/2012 VC 2.32 (80%) no obst, dlco 71 corrects to 100  No evidence of progression of RA lung dz  The goal with a chronic steroid dependent illness is always arriving at the lowest effective dose that controls the disease/symptoms and not accepting a set "formula" which is based on statistics or guidelines that don't always take into account patient  variability or the natural hx of the dz in every individual patient, which may well vary over time.  For now therefore I recommend the patient maintain  A floor of 2.5 mg per day which is basically physiologic    Each maintenance medication was reviewed in detail including most importantly the difference between maintenance and as needed and under what circumstances the prns are to be used.  Please see instructions for details which were reviewed in writing and the patient given a copy.

## 2012-03-22 NOTE — Patient Instructions (Addendum)
No change in medications  Only use your albuterol as a rescue medication to be used if you can't catch your breath by resting or doing a relaxed purse lip breathing pattern. The less you use it, the better it will work when you need it.   Please schedule a follow up visit in 6 months but call sooner if needed

## 2012-03-22 NOTE — Progress Notes (Signed)
PFT done today. 

## 2012-03-22 NOTE — Progress Notes (Signed)
Subjective:    Patient ID: Jamie Cox, female    DOB: [birth year] .   MRN: [id number]  HPI Primary = [facility] out[facility]/  Dr. Darnelle Maffucci    21 yobf never smoker dx RA in her early 20's steroid dep x around 2000 but also mtx, plaquenil in past per Dr Coral Spikes and also seen [facility] and due to f/u at [facility] with ? 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   August 10, 2010 1st pulmonary office eval cc for cough starting around 2006 but much worse x 4 months with constant need to clear the throat, mucus is clear, cough so hard looses breath. Much worse then ended in ER with dx of pna with cp on R rx as outpt with abx and cp now gone but still coughing. arthritis worse x 4 months and plans to go establish at [facility].  Imp was Boop plus/minus uacs rec 1) Prednsione 10 mg 4 each am x 2days, 2x2days, 1x2days and stop and resume prednisone 2.5 mg daily  2) Stop boniva  3) Start Nexium Take one 30-60 min before first meal of the day and also Zantac 150 mg one at bedtime  4) GERD (REFLUX) diet   09/14/10  ov cc cough dry day = night,  pred now at 2.5 mg per day cough was better on the higher doses but plan is to start plaquenil this week per [facility].  Mostly dry.   >>Sinus CT pos+>Augmentin x 10d , zantac At bedtime    10/05/10 Follow up  Pt returns for follow up . CT scan last ov was positive for acute sinusitis. She was treated with 10 days of Augmentin. She is feeling some better with decreased cough and congestion . Steroids set w/ floor of 10 and ceiling of 20mg . She has not tapered down to 10mg  b/c breathing not back to her baseline.  rec Remember to taper Prednisone 10 mg take two daily until breathing and cough better then 10 mg daily May use Mucinex DM Twice daily  For cough/congestion As needed   follow up Dr. Sherene Sires  In 3 weeks with PFTS -lung function test.     12/23/2010 ov/Jamie Cox  tapered per rheum to  2.5 prednisone per day,  One episode of cp post on R same location as before but not as intense  and better since the z pack, not consistent with ppi ac but is holding boniva. Overall better but concerned cough still intermittently present. No purulent sputum. rec For cough take mucinex dm twice daily( 1200mg  every 12 hours) and if this fails use vicodin Protonix 40 mg Take 30-60 min before first meal of the day and Zantac at bedtime perfectly regularly for a month Please see patient coordinator before you leave today  to schedule for CT chest and sinus > improved   05/24/2011 f/u ov/Jamie Cox on 2.5 mg Prednisone  cc worse dry  cough x sev months p traveling by car to [facility]. Cough so severe gets light headed, ? Better p saba,  Worse if get overheated, no excess or purulent sputum or subj wheezing. rec Prednisone 10mg  take 2 until better, then 1 daily x one week then one half daily thereafter - if worse start back at 2 daily  09/21/2011 f/u ov/Jamie Cox 2 pred daily > fine,  1 daily was fine, half daily also ok then started 2.5 mg per day and worse dry cough since with no rash, arthralgias or sign  Limiting sob or overt hb still holding  boniva. No overt sinus symptoms. rec Prednisone 10 mg 2 daily until better, 1 daily for a week, then one half daily but if any worse cough or breathing go back to the previous dose that worked Please schedule a follow up visit in 3 months but call sooner if needed with your annual pft's due then  03/22/2012 f/u ov/Jamie Cox on prednisone 2.5 mg day / followed by Rheum at  [facility]  and needing the saba not at all. No obvious daytime variabilty or assoc chronic cough or cp or chest tightness, subjective wheeze overt sinus or hb symptoms. No unusual exp hx  Sleeping ok without nocturnal  or early am exacerbation  of respiratory  c/o's or need for noct saba. Also denies any obvious fluctuation of symptoms with weather or environmental changes or other aggravating or alleviating factors except as outlined above   ROS  The following are not active complaints unless  bolded sore throat, dysphagia, dental problems, itching, sneezing,  nasal congestion or excess/ purulent secretions, ear ache,   fever, chills, sweats, unintended wt loss, pleuritic or exertional cp, hemoptysis,  orthopnea pnd or leg swelling, presyncope, palpitations, heartburn, abdominal pain, anorexia, nausea, vomiting, diarrhea  or change in bowel or urinary habits, change in stools or urine, dysuria,hematuria,  rash, arthralgias, visual complaints, headache, numbness weakness or ataxia or problems with walking or coordination,  change in mood/affect or memory.              Objective:   Physical Exam  Chronically ill bf nad mod cushingnoid. Wt 105 12/23/2010 > 05/24/2011  105 > 09/21/2011  102> 03/22/2012  115  HEENT: nl dentition, turbinates, and orophanx. Nl external ear canals without cough reflex  NECK : without JVD/Nodes/TM/ nl carotid upstrokes bilaterally  LUNGS: no acc muscle use, clear to A and P bilaterally without cough on insp or exp maneuvers  CV: RRR no s3 or murmur or increase in P2, no edema .  ABD: soft and nontender with nl excursion in the supine position. No bruits or organomegaly, bowel sounds nl  MS: warm with classic moderate RA changes hands no calf tenderness, cyanosis or clubbing        CXR  01/27/12 Stable chronic changes at the lung bases. No new cardiopulmonary  abnormality.      Assessment & Plan:

## 2012-04-13 ENCOUNTER — Other Ambulatory Visit: Payer: Self-pay | Admitting: *Deleted

## 2012-04-13 MED ORDER — DICLOFENAC SODIUM 75 MG PO TBEC: 75.0000 mg | DELAYED_RELEASE_TABLET | Freq: Two times a day (BID) | ORAL | Status: DC | PRN

## 2012-04-13 MED ORDER — BENZONATATE 200 MG PO CAPS: 200.0000 mg | ORAL_CAPSULE | Freq: Three times a day (TID) | ORAL | Status: DC | PRN

## 2012-04-13 MED ORDER — IBANDRONATE SODIUM 150 MG PO TABS: 150.0000 mg | ORAL_TABLET | ORAL | Status: DC

## 2012-04-13 MED ORDER — HYDROCODONE-ACETAMINOPHEN 5-500 MG PO TABS: 1.0000 | ORAL_TABLET | Freq: Four times a day (QID) | ORAL | Status: DC | PRN

## 2012-04-24 NOTE — Telephone Encounter (Signed)
2Rx called in and verified with pharmacy that they were not called in already.

## 2012-05-22 ENCOUNTER — Other Ambulatory Visit: Payer: Self-pay | Admitting: Otolaryngology

## 2012-05-22 DIAGNOSIS — J329 Chronic sinusitis, unspecified: Secondary | ICD-10-CM

## 2012-05-23 ENCOUNTER — Ambulatory Visit
Admission: RE | Admit: 2012-05-23 | Discharge: 2012-05-23 | Disposition: A | Discharge status: Home / Self Care | Payer: BC Managed Care – PPO | Source: Ambulatory Visit | Attending: Otolaryngology | Admitting: Otolaryngology

## 2012-05-23 DIAGNOSIS — J329 Chronic sinusitis, unspecified: Secondary | ICD-10-CM

## 2012-06-06 HISTORY — PX: NASAL SINUS SURGERY: SHX719

## 2012-06-12 ENCOUNTER — Other Ambulatory Visit: Payer: Self-pay | Admitting: *Deleted

## 2012-06-12 MED ORDER — HYDROCODONE-ACETAMINOPHEN 5-500 MG PO TABS: 1.0000 | ORAL_TABLET | Freq: Four times a day (QID) | ORAL | Status: DC | PRN

## 2012-06-12 NOTE — Telephone Encounter (Signed)
Please phone in.   She has an appointment 1/27.  If she does not show, will no longer refill until she is seen.   Thanks!  EBM

## 2012-06-12 NOTE — Telephone Encounter (Signed)
Called to pharm, also changed to 325 of tylenol, please change the med list to reflect this, thanks

## 2012-06-13 ENCOUNTER — Other Ambulatory Visit: Payer: Self-pay | Admitting: Internal Medicine

## 2012-06-13 MED ORDER — HYDROCODONE-ACETAMINOPHEN 5-325 MG PO TABS: 1.0000 | ORAL_TABLET | Freq: Four times a day (QID) | ORAL | Status: DC | PRN

## 2012-06-13 NOTE — Telephone Encounter (Signed)
Thank you!!  Will do.   EBM

## 2012-06-15 ENCOUNTER — Other Ambulatory Visit: Payer: Self-pay | Admitting: Otolaryngology

## 2012-07-02 ENCOUNTER — Ambulatory Visit (INDEPENDENT_AMBULATORY_CARE_PROVIDER_SITE_OTHER): Payer: BC Managed Care – PPO | Admitting: Internal Medicine

## 2012-07-02 ENCOUNTER — Encounter: Payer: Self-pay | Admitting: Internal Medicine

## 2012-07-02 VITALS — BP 135/81 | HR 104 | Temp 98.2°F | Ht 61.0 in | Wt 108.2 lb

## 2012-07-02 DIAGNOSIS — D539 Nutritional anemia, unspecified: Secondary | ICD-10-CM

## 2012-07-02 DIAGNOSIS — Z0000 Encounter for general adult medical examination without abnormal findings: Secondary | ICD-10-CM

## 2012-07-02 DIAGNOSIS — D509 Iron deficiency anemia, unspecified: Secondary | ICD-10-CM

## 2012-07-02 DIAGNOSIS — R05 Cough: Secondary | ICD-10-CM

## 2012-07-02 DIAGNOSIS — M069 Rheumatoid arthritis, unspecified: Secondary | ICD-10-CM

## 2012-07-02 DIAGNOSIS — R030 Elevated blood-pressure reading, without diagnosis of hypertension: Secondary | ICD-10-CM

## 2012-07-02 DIAGNOSIS — M810 Age-related osteoporosis without current pathological fracture: Secondary | ICD-10-CM

## 2012-07-02 DIAGNOSIS — R059 Cough, unspecified: Secondary | ICD-10-CM

## 2012-07-02 DIAGNOSIS — J329 Chronic sinusitis, unspecified: Secondary | ICD-10-CM

## 2012-07-02 MED ORDER — GUAIFENESIN-CODEINE 100-10 MG/5ML PO SYRP: 5.0000 mL | ORAL_SOLUTION | Freq: Three times a day (TID) | ORAL | Status: DC | PRN

## 2012-07-02 NOTE — Assessment & Plan Note (Signed)
Her chronic cough is discussed above in HPI, likely multifactorial.   Will give codeine-guaifenisin combo today.  Advised her to not take with vicoden or mucinex-d and take only at night as it can cause drowsiness.  Will hopefully help with cough and sleep

## 2012-07-02 NOTE — Assessment & Plan Note (Signed)
135/81 today, which is at goal of < 140/90

## 2012-07-02 NOTE — Assessment & Plan Note (Signed)
On Boniva, calcium, vitamin D.   Consider repeat DEXA this year for follow up (2008 was last one)

## 2012-07-02 NOTE — Patient Instructions (Addendum)
Please schedule a follow up visit within the next 2-3 months for a PAP smear.   For your medications:   Please bring all of your pill  Bottles with you to each visit.  This will help make sure that we have an up to date list of all the medications you are taking.  Please also bring any over the counter herbal medications you are taking (not including advil, tylenol, etc.)  Please continue taking your medications as prescribed.  You will have a trial of a cough medicine containing codeine (robitussin).  Please do not take this with the mucinex.  Give [place] a call and let us know if it is working better  Please contact the medical group that was planning to do your colonoscopy and get that scheduled!  Thank you!

## 2012-07-02 NOTE — Assessment & Plan Note (Signed)
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   She is currently taking a MVI with iron, but only occasionally.  I advised her to take this regularly and discussed benefit of iron.    She has seen a doctor to schedule a colonoscopy and she will follow up with them now that she has insurance.   May need iron supplementation, recheck iron panel at next visit.

## 2012-07-02 NOTE — Assessment & Plan Note (Signed)
Follows with ENT.  Stable since recent procedure.  Continue medications flonase, mucinex-dm, claritin.   Singulair may be an option for this patient, but would defer to ENT.
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
## 2012-07-02 NOTE — Assessment & Plan Note (Addendum)
MMG: 02/14/12 - prob benign findings, due in 6 months (March 2014) DEXA - 2008, on therapy Colonoscopy - will schedule PAP - due, she will make appointment with me in 2 months  Immunizations UTD

## 2012-07-02 NOTE — Assessment & Plan Note (Signed)
Follows with rheumatology and pulmonary  On prednisone and plaquenil with good results, no issue today.

## 2012-07-25 ENCOUNTER — Encounter: Payer: Self-pay | Admitting: Internal Medicine

## 2012-07-25 ENCOUNTER — Emergency Department (HOSPITAL_COMMUNITY): Payer: BC Managed Care – PPO

## 2012-07-25 ENCOUNTER — Encounter (HOSPITAL_COMMUNITY): Payer: Self-pay | Admitting: *Deleted

## 2012-07-25 ENCOUNTER — Ambulatory Visit (INDEPENDENT_AMBULATORY_CARE_PROVIDER_SITE_OTHER): Payer: BC Managed Care – PPO | Admitting: Internal Medicine

## 2012-07-25 ENCOUNTER — Emergency Department (HOSPITAL_COMMUNITY)
Admission: EM | Admit: 2012-07-25 | Discharge: 2012-07-25 | Disposition: A | Discharge status: Home / Self Care | Payer: BC Managed Care – PPO | Attending: Emergency Medicine | Admitting: Emergency Medicine

## 2012-07-25 VITALS — BP 119/81 | HR 126 | Temp 99.4°F | Wt 108.1 lb

## 2012-07-25 DIAGNOSIS — M818 Other osteoporosis without current pathological fracture: Secondary | ICD-10-CM | POA: Insufficient documentation

## 2012-07-25 DIAGNOSIS — T380X5A Adverse effect of glucocorticoids and synthetic analogues, initial encounter: Secondary | ICD-10-CM | POA: Insufficient documentation

## 2012-07-25 DIAGNOSIS — H6012 Cellulitis of left external ear: Secondary | ICD-10-CM | POA: Insufficient documentation

## 2012-07-25 DIAGNOSIS — R509 Fever, unspecified: Secondary | ICD-10-CM | POA: Insufficient documentation

## 2012-07-25 DIAGNOSIS — Z9889 Other specified postprocedural states: Secondary | ICD-10-CM | POA: Insufficient documentation

## 2012-07-25 DIAGNOSIS — Z8679 Personal history of other diseases of the circulatory system: Secondary | ICD-10-CM | POA: Insufficient documentation

## 2012-07-25 DIAGNOSIS — Z79899 Other long term (current) drug therapy: Secondary | ICD-10-CM | POA: Insufficient documentation

## 2012-07-25 DIAGNOSIS — H60399 Other infective otitis externa, unspecified ear: Secondary | ICD-10-CM

## 2012-07-25 DIAGNOSIS — L0390 Cellulitis, unspecified: Secondary | ICD-10-CM

## 2012-07-25 DIAGNOSIS — Z8709 Personal history of other diseases of the respiratory system: Secondary | ICD-10-CM | POA: Insufficient documentation

## 2012-07-25 DIAGNOSIS — D649 Anemia, unspecified: Secondary | ICD-10-CM | POA: Insufficient documentation

## 2012-07-25 DIAGNOSIS — IMO0002 Reserved for concepts with insufficient information to code with codable children: Secondary | ICD-10-CM | POA: Insufficient documentation

## 2012-07-25 DIAGNOSIS — M069 Rheumatoid arthritis, unspecified: Secondary | ICD-10-CM | POA: Insufficient documentation

## 2012-07-25 HISTORY — DX: Chronic sinusitis, unspecified: J32.9

## 2012-07-25 LAB — SEDIMENTATION RATE: Sed Rate: 57 mm/hr — ABNORMAL HIGH (ref 0–22)

## 2012-07-25 LAB — CBC WITH DIFFERENTIAL/PLATELET
Basophils Relative: 0 % (ref 0–1)
Eosinophils Absolute: 0 10*3/uL (ref 0.0–0.7)
Hemoglobin: 11.8 g/dL — ABNORMAL LOW (ref 12.0–15.0)
Lymphocytes Relative: 5 % — ABNORMAL LOW (ref 12–46)
MCHC: 33.2 g/dL (ref 30.0–36.0)
Monocytes Relative: 6 % (ref 3–12)
Neutrophils Relative %: 89 % — ABNORMAL HIGH (ref 43–77)
RBC: 5.06 MIL/uL (ref 3.87–5.11)
WBC: 10.3 10*3/uL (ref 4.0–10.5)

## 2012-07-25 LAB — BASIC METABOLIC PANEL
CO2: 25 mEq/L (ref 19–32)
Calcium: 10 mg/dL (ref 8.4–10.5)
GFR calc non Af Amer: >90 mL/min (ref >90)
Potassium: 4.2 mEq/L (ref 3.5–5.1)
Sodium: 140 mEq/L (ref 135–145)

## 2012-07-25 LAB — C-REACTIVE PROTEIN: CRP: 6.9 mg/dL — ABNORMAL HIGH (ref <0.60)

## 2012-07-25 MED ORDER — ACETAMINOPHEN 325 MG PO TABS
650.0000 mg | ORAL_TABLET | Freq: Once | ORAL | Status: AC
  Administered 2012-07-25: 650 mg via ORAL
  Filled 2012-07-25: qty 2

## 2012-07-25 MED ORDER — HYDROMORPHONE HCL PF 1 MG/ML IJ SOLN
1.0000 mg | Freq: Once | INTRAMUSCULAR | Status: AC
  Administered 2012-07-25: 1 mg via INTRAVENOUS
  Filled 2012-07-25: qty 1

## 2012-07-25 MED ORDER — SULFAMETHOXAZOLE-TRIMETHOPRIM 800-160 MG PO TABS: 1.0000 | ORAL_TABLET | Freq: Two times a day (BID) | ORAL | Status: AC

## 2012-07-25 MED ORDER — SODIUM CHLORIDE 0.9 % IV BOLUS (SEPSIS)
1000.0000 mL | Freq: Once | INTRAVENOUS | Status: AC
  Administered 2012-07-25: 1000 mL via INTRAVENOUS

## 2012-07-25 MED ORDER — SULFAMETHOXAZOLE-TRIMETHOPRIM 800-160 MG PO TABS: 2.0000 | ORAL_TABLET | Freq: Two times a day (BID) | ORAL | Status: DC

## 2012-07-25 MED ORDER — VANCOMYCIN HCL IN DEXTROSE 1-5 GM/200ML-% IV SOLN
1000.0000 mg | Freq: Once | INTRAVENOUS | Status: AC
  Administered 2012-07-25: 1000 mg via INTRAVENOUS
  Filled 2012-07-25: qty 200

## 2012-07-25 MED ORDER — IOHEXOL 300 MG/ML  SOLN
75.0000 mL | Freq: Once | INTRAMUSCULAR | Status: AC | PRN
  Administered 2012-07-25: 75 mL via INTRAVENOUS

## 2012-07-25 MED ORDER — PIPERACILLIN-TAZOBACTAM 3.375 G IVPB
3.3750 g | Freq: Once | INTRAVENOUS | Status: AC
  Administered 2012-07-25: 3.375 g via INTRAVENOUS
  Filled 2012-07-25: qty 50

## 2012-07-25 NOTE — Patient Instructions (Signed)
-  You need to be evaluated at the ED

## 2012-07-25 NOTE — ED Provider Notes (Signed)
I saw and evaluated the patient, reviewed the resident's note and I agree with the findings and plan.  Internal Med service saw in ED. Tachycardia and pain resolved. D/C'd patient with PO abx and close follow-up. Pt in agreement with plan.   Loren Racer, MD 07/25/12 2330

## 2012-07-25 NOTE — ED Notes (Signed)
Pt reports having swelling to left earlobe x 3 days, became more severe today, went to [facility] and sent here for further eval.

## 2012-07-25 NOTE — ED Notes (Signed)
Pt comfortable with discharge instructions.  

## 2012-07-25 NOTE — ED Notes (Signed)
Admitting physician at bedside

## 2012-07-25 NOTE — ED Provider Notes (Signed)
History     CSN: [id number]  Arrival date & time 07/25/12  1553   First MD Initiated Contact with Patient 07/25/12 1616      Chief Complaint  Patient presents with  . Ear Problem    HPI Jamie Cox is a 52 y.o. female who presents to the ED with concern of redness and swelling of L earlobe over last three days.  Never had this before.  Pain moderate in severity, located at L ear, worse with palpation, better with rest.  No radiation.  Has had subjective fever over this time, but none measured.  Was seen at PCP and found to have temp of 99.2 and pulse of 140.  Sent here for further evaluation.  Recently had sinus surgery for chronic sinusitis approx 1 month ago but has been doing well since thin.    Past Medical History  Diagnosis Date  . Rheumatoid arthritis   . Personal history of other diseases of circulatory system   . Osteoporosis, unspecified     steroid induced  . Unspecified deficiency anemia     microcytic    History reviewed. No pertinent past surgical history.  Family History  Problem Relation Age of Onset  . Liver cancer Maternal Grandmother   . Scleroderma Mother   . Prostate cancer Father   . Lung cancer Paternal Uncle   . Coronary artery disease Paternal Grandmother   . Hypertension Paternal Grandmother     History  Substance Use Topics  . Smoking status: Never Smoker   . Smokeless tobacco: Not on file  . Alcohol Use: No    OB History   Grav Para Term Preterm Abortions TAB SAB Ect Mult Living                  Review of Systems  Constitutional: Positive for fever and chills.  HENT: Negative for congestion, rhinorrhea, neck pain and neck stiffness.   Respiratory: Negative for cough and shortness of breath.   Cardiovascular: Negative for chest pain.  Gastrointestinal: Negative for nausea, vomiting, abdominal pain, diarrhea and abdominal distention.  Endocrine: Negative for polyuria.  Genitourinary: Negative for dysuria.  Skin: Negative for  rash.  Neurological: Negative for headaches.  Psychiatric/Behavioral: Negative.   All other systems reviewed and are negative.    Allergies  Avelox  Home Medications   Current Outpatient Rx  Name  Route  Sig  Dispense  Refill  . acetaminophen (TYLENOL) 650 MG CR tablet   Oral   Take 650 mg by mouth every 8 (eight) hours as needed. For pain         . albuterol (PROVENTIL HFA;VENTOLIN HFA) 108 (90 BASE) MCG/ACT inhaler   Inhalation   Inhale 2 puffs into the lungs every 6 (six) hours as needed. For shortness of breath/wheezing         . amoxicillin-clavulanate (AUGMENTIN) 875-125 MG per tablet   Oral   Take 1 tablet by mouth 2 (two) times daily.         . benzonatate (TESSALON) 200 MG capsule   Oral   Take 1 capsule (200 mg total) by mouth 3 (three) times daily as needed. For cough   20 capsule   1   . calcium-vitamin D 250-100 MG-UNIT per tablet   Oral   Take 1 tablet by mouth 2 (two) times daily.           Marland Kitchen dextromethorphan-guaiFENesin (MUCINEX DM) 30-600 MG per 12 hr tablet   Oral   Take  1 tablet by mouth every 12 (twelve) hours.         . diclofenac (VOLTAREN) 75 MG EC tablet   Oral   Take 1 tablet (75 mg total) by mouth 2 (two) times daily as needed. For pain   30 tablet   1   . fluticasone (FLONASE) 50 MCG/ACT nasal spray   Nasal   Place 2 sprays into the nose daily.   16 g   1   . folic acid (FOLVITE) 1 MG tablet   Oral   Take 1 mg by mouth daily.           Marland Kitchen guaiFENesin-codeine (ROBITUSSIN AC) 100-10 MG/5ML syrup   Oral   Take 5 mLs by mouth 3 (three) times daily as needed for cough.   120 mL   0   . HYDROcodone-acetaminophen (NORCO/VICODIN) 5-325 MG per tablet   Oral   Take 1 tablet by mouth every 6 (six) hours as needed for pain.   30 tablet   0   . hydroxychloroquine (PLAQUENIL) 200 MG tablet   Oral   Take 1 tablet by mouth 2 (two) times daily.         Marland Kitchen ibandronate (BONIVA) 150 MG tablet   Oral   Take 1 tablet (150 mg  total) by mouth every 30 (thirty) days. Take in the AM with a full glass of water, on empty stomach, and don't lie down for the next 30 min or take anything else by mouth.   1 tablet   1   . loratadine (CLARITIN) 10 MG tablet   Oral   Take 1 tablet (10 mg total) by mouth daily.   90 tablet   2   . Multiple Vitamin (MULTIVITAMIN) capsule   Oral   Take 1 capsule by mouth daily.           . pantoprazole (PROTONIX) 40 MG tablet   Oral   Take 1 tablet (40 mg total) by mouth daily.   30 tablet   11   . predniSONE (DELTASONE) 2.5 MG tablet   Oral   Take 2.5 mg by mouth daily.         . ranitidine (ZANTAC) 300 MG tablet   Oral   Take 1 tablet (300 mg total) by mouth at bedtime.   30 tablet   3     BP 128/81  Pulse 120  Temp(Src) 98.9 F (37.2 C) (Oral)  Resp 18  SpO2 100%  Physical Exam  Nursing note and vitals reviewed. Constitutional: She is oriented to person, place, and time. She appears well-developed and well-nourished. No distress.  HENT:  Head: Normocephalic and atraumatic.  Right Ear: Hearing, tympanic membrane, external ear and ear canal normal.  Left Ear: There is swelling (of entire pinna) and tenderness (over earlobe and mastoid). No drainage. Tympanic membrane is not injected. No decreased hearing is noted.  Nose: Nose normal.  Mouth/Throat: Oropharynx is clear and moist. No oropharyngeal exudate.  Eyes: EOM are normal. Pupils are equal, round, and reactive to light.  Neck: Normal range of motion. Neck supple. No tracheal deviation present.  Cardiovascular: Normal rate.   Pulmonary/Chest: Effort normal and breath sounds normal. No stridor. No respiratory distress. She has no wheezes. She has no rales.  Abdominal: Soft. She exhibits no distension. There is no tenderness. There is no rebound.  Musculoskeletal: Normal range of motion.  Neurological: She is alert and oriented to person, place, and time.  Skin: Skin is warm and  dry. She is not diaphoretic.     ED Course  Procedures (including critical care time)  Labs Reviewed  CBC WITH DIFFERENTIAL - Abnormal; Notable for the following:    Hemoglobin 11.8 (*)    HCT 35.5 (*)    MCV 70.2 (*)    MCH 23.3 (*)    Neutrophils Relative 89 (*)    Lymphocytes Relative 5 (*)    Neutro Abs 9.2 (*)    Lymphs Abs 0.5 (*)    All other components within normal limits  SEDIMENTATION RATE - Abnormal; Notable for the following:    Sed Rate 57 (*)    All other components within normal limits  C-REACTIVE PROTEIN - Abnormal; Notable for the following:    CRP 6.9 (*)    All other components within normal limits  BASIC METABOLIC PANEL   Ct Maxillofacial W/cm  07/25/2012  *RADIOLOGY REPORT*  Clinical Data: Left ear pain, swelling and redness.  CT MAXILLOFACIAL WITH CONTRAST  Technique:  Multidetector CT imaging of the maxillofacial structures was performed with intravenous contrast. Multiplanar CT image reconstructions were also generated.  Contrast: 75mL OMNIPAQUE IOHEXOL 300 MG/ML  SOLN  Comparison: 05/23/2012.  Findings: Surgical changes from prior sinonasal surgery.  There is diffuse mucosal thickening of the maxillary, residual ethmoid and left half sphenoid sinus.  The frontal sinus is clear.  The mastoid air cells and middle ear cavities are clear.  No findings for mastoiditis.  Diffuse soft tissue thickening and enhancement involving the left year but no discrete abscess.  Findings suggest cellulitis.  The left parotid gland is unremarkable.  The visualized portion of the brain is unremarkable.  The major vascular structures are normal.  Scattered posterior cervical chain lymph nodes are likely inflammatory.  No mass.  IMPRESSION:  1.  Diffuse soft tissue thickening and enhancement involving the left year suggesting cellulitis.  I do not see a discrete abscess. 2.  Prior sinonasal surgery with diffuse mucoperiosteal thickening. 3.  The mastoid air cells and middle ear cavities are clear. 4.  Inflamed neck  nodes.   Original Report Authenticated By: Rudie Meyer, M.D.      1. Cellulitis       MDM   Hebah Bogosian is a 52 y.o. female who presents to the ED for concern of cellulitis of ear.  Sent from PCP's office.  Workup performed.  No mastoiditis.  Patient mildly tachycardic.  No fever.  Given steroid use with RA, vanc/zosyn given.  Patient's PCP's team consulted.  Wishing to treat as an outpatient.  Provided bactrim prescription.  Patient to f/u in 2 days.  Return precautions reinforced.  Patient a reasonable person.  Safe for discharge.  Patient discharged.  No other acute concerns while in ED.       Arloa Koh, MD 07/25/12 2316

## 2012-07-25 NOTE — Progress Notes (Signed)
INTERNAL MEDICINE TEACHING SERVICE ER Consult Note   Date: 07/25/2012  Patient Name: Jamie Cox  Date of Birth:  Jun 20, 1960  MRN:   [id number]  Age / gender:  52 y.o., female  PCP:   Debe Coder, MD    Chief Complaint: L ear pain, swelling  History of Present Illness: Patient is a 52 y.o. female with a PMHx of  has a past medical history of rheumatoid arthritis, osteoporosis, anemia, chronic cough, and GERD who presents to [facility] for evaluation of 3 day history of left outer ear swelling and redness. Said that she first noticed some redness and pain a few days ago, which has has now spread to cover entire ear and neck. Earlier this morning it was exquisitely tender to touch and she felt subjective fevers at home, so she decided to seek treatment at PCP office.  Patient has not previously had similar symptoms involving her left ear. No recent trauma to ears, no change or loss of hearing. She has not taken any recent antibiotics. She denies purulent drainage from L ear.  She confirms chronic intermittent bilateral tinnitus for years. Confirms frontal headache this AM that was relieved with tylenol.  Recent sinus surgery 06/15/12 for chronic maxillary and ethmoid sinusitis. Was last evaluated by her ENT last week, with noted improvement of sinuses.    During the ED course, the patient was given IV Zosyn and Vancomycin.    Review of Systems: Constitutional:  admits to chills. Denies fever, diaphoresis, appetite change and fatigue.  HEENT: admits to nasal congestion (improved), pain of left pinna,  Denies hearing loss, dysphagia, odynophagia, sore throat, neck pain, neck stiffness.  Respiratory: admits to chronic cough Denies SOB, DOE, chest tightness, and wheezing.  Cardiovascular: admits to some sharp pain in back and chest after pulling a muscle earlier today which resolved with tyelnol therap. Denies palpitations and leg swelling.  Gastrointestinal: admits to one loose stool earlier  this am, then 2 normal BM.  Denies nausea, vomiting, abdominal pain, constipation, blood in stool.  Genitourinary: denies dysuria, urgency, frequency, hematuria, flank pain and difficulty urinating.  Musculoskeletal: denies  myalgias, back pain, joint swelling, arthralgias and gait problem.   Skin: denies pallor, rash and wound.  Neurological: denies dizziness, seizures, syncope, weakness, light-headedness, numbness and headaches.   Hematological: denies adenopathy, easy bruising   Psychiatric/ Behavioral: denies agitation.     Current Outpatient Medications: Current Facility-Administered Medications  Medication Dose Route Frequency Provider Last Rate Last Dose  . piperacillin-tazobactam (ZOSYN) IVPB 3.375 g  3.375 g Intravenous Once Arloa Koh, MD 12.5 mL/hr at 07/25/12 1935 3.375 g at 07/25/12 1935   Current Outpatient Prescriptions  Medication Sig Dispense Refill  . acetaminophen (TYLENOL) 650 MG CR tablet Take 650 mg by mouth every 8 (eight) hours as needed. For pain      . albuterol (PROVENTIL HFA;VENTOLIN HFA) 108 (90 BASE) MCG/ACT inhaler Inhale 2 puffs into the lungs every 6 (six) hours as needed. For shortness of breath/wheezing      . calcium-vitamin D 250-100 MG-UNIT per tablet Take 1 tablet by mouth 2 (two) times daily.        Marland Kitchen dextromethorphan-guaiFENesin (MUCINEX DM) 30-600 MG per 12 hr tablet Take 1 tablet by mouth every 12 (twelve) hours.      . diclofenac (VOLTAREN) 75 MG EC tablet Take 1 tablet (75 mg total) by mouth 2 (two) times daily as needed. For pain  30 tablet  1  . diphenhydrAMINE (BENADRYL) 25 mg capsule Take  25-50 mg by mouth 2 (two) times daily as needed for itching. Pt will take 1 capsule during the day and 2 caps at night when needed.      . fluticasone (FLONASE) 50 MCG/ACT nasal spray Place 2 sprays into the nose daily.  16 g  1  . folic acid (FOLVITE) 1 MG tablet Take 1 mg by mouth daily.        Marland Kitchen guaiFENesin-codeine (ROBITUSSIN AC) 100-10 MG/5ML syrup  Take 5 mLs by mouth 3 (three) times daily as needed for cough.  120 mL  0  . HYDROcodone-acetaminophen (NORCO/VICODIN) 5-325 MG per tablet Take 1 tablet by mouth every 6 (six) hours as needed for pain.  30 tablet  0  . hydroxychloroquine (PLAQUENIL) 200 MG tablet Take 1 tablet by mouth 2 (two) times daily.      Marland Kitchen ibandronate (BONIVA) 150 MG tablet Take 1 tablet (150 mg total) by mouth every 30 (thirty) days. Take in the AM with a full glass of water, on empty stomach, and don't lie down for the next 30 min or take anything else by mouth.  1 tablet  1  . Multiple Vitamin (MULTIVITAMIN) capsule Take 1 capsule by mouth daily.        . pantoprazole (PROTONIX) 40 MG tablet Take 1 tablet (40 mg total) by mouth daily.  30 tablet  11  . predniSONE (DELTASONE) 2.5 MG tablet Take 2.5 mg by mouth daily.      . ranitidine (ZANTAC) 300 MG tablet Take 1 tablet (300 mg total) by mouth at bedtime.  30 tablet  3    Allergies: Avelox  Past Medical History: Past Medical History  Diagnosis Date  . Rheumatoid arthritis   . Personal history of other diseases of circulatory system   . Osteoporosis, unspecified     steroid induced  . Unspecified deficiency anemia     microcytic  . Chronic sinusitis     Followed by Dr. Jenne Pane    Past Surgical History: Past Surgical History  Procedure Laterality Date  . Nasal sinus surgery  06/2012    Family History: Family History  Problem Relation Age of Onset  . Liver cancer Maternal Grandmother   . Scleroderma Mother   . Prostate cancer Father   . Lung cancer Paternal Uncle   . Coronary artery disease Paternal Grandmother   . Hypertension Paternal Grandmother   . Hypertension Maternal Grandmother     Social History: History   Social History  . Marital Status: Married    Spouse Name: N/A    Number of Children: 2  . Years of Education: N/A   Occupational History  . Data processing manager    Social History Main Topics  . Smoking status: Never Smoker   .  Smokeless tobacco: Not on file  . Alcohol Use: No  . Drug Use: No  . Sexually Active: Not Currently   Other Topics Concern  . Not on file   Social History Narrative  . No narrative on file    Vital Signs: BP 117/75  Pulse 110  Temp(Src) 98.9 F (37.2 C) (Oral)  Resp 16  SpO2 98%   Physical Exam: General: Vital signs reviewed and noted. Well-developed, well-nourished female in no acute distress;Slightly somnolent as examination completed after dose of IV dilaudid; appropriate and cooperative throughout examination.  Head: Normocephalic, atraumatic.  Ears:  L ear with erythema over entire pinna and extending onto cheek and jawline, edematous with early small blister formation in areas of higher  tension, no abscess or fluctuance appreciated, no drainage; slightly tender to palpation but patient reports much improved after IV dilaudid; No involvement of internal auditory canal or inner ear as visualized. TM obstructed by cerumen.  R ear normal appearing, painless, TM obstructed by cerumen  Eyes: PERRL, EOMI, No signs of anemia or jaundince.  Nose: Mucous membranes moist, not inflammed, nonerythematous.  Throat: Oropharynx nonerythematous, no exudate appreciated.   Neck: No deformities, masses, or tenderness noted.Supple, No carotid Bruits, no JVD.  Lungs:  Normal respiratory effort. Clear to auscultation BL without crackles or wheezes.  Heart: Tachycardic with HR 100-110 bpm. S1 and S2 normal without gallop, murmur, or rubs.  Abdomen:  BS normoactive. Soft, Nondistended, non-tender.  No masses or organomegaly.  Extremities: No pretibial edema.  Neurologic: A&O X3, CN II - XII are grossly intact. Motor strength is 5/5 in the all 4 extremities, Sensations intact to light touch, Cerebellar signs negative.  Skin: No visible rashes, scars.   Lab results: CBC:    Component Value Date/Time   WBC 10.3 07/25/2012 1628   HGB 11.8* 07/25/2012 1628   HCT 35.5* 07/25/2012 1628   PLT 213  07/25/2012 1628   MCV 70.2* 07/25/2012 1628   NEUTROABS 9.2* 07/25/2012 1628   LYMPHSABS 0.5* 07/25/2012 1628   MONOABS 0.6 07/25/2012 1628   EOSABS 0.0 07/25/2012 1628   BASOSABS 0.0 07/25/2012 1628     Metabolic Panel:    Component Value Date/Time   NA 140 07/25/2012 1628   K 4.2 07/25/2012 1628   CL 102 07/25/2012 1628   CO2 25 07/25/2012 1628   BUN 13 07/25/2012 1628   CREATININE 0.71 07/25/2012 1628   GLUCOSE 88 07/25/2012 1628   CALCIUM 10.0 07/25/2012 1628   AST 37 01/28/2012 0104   ALT 34 01/28/2012 0104   ALKPHOS 58 01/28/2012 0104   BILITOT 0.3 01/28/2012 0104   PROT 6.2 01/28/2012 0104   ALBUMIN 3.0* 01/28/2012 0104    Imaging results:  Ct Maxillofacial W/cm  IMPRESSION:  1.  Diffuse soft tissue thickening and enhancement involving the left year suggesting cellulitis.  I do not see a discrete abscess. 2.  Prior sinonasal surgery with diffuse mucoperiosteal thickening. 3.  The mastoid air cells and middle ear cavities are clear. 4.  Inflamed neck nodes.    Rudie Meyer, M.D.     Assessment & Plan: Patient is a 52 y.o. female with a PMHx of  has a past medical history of rheumatoid arthritis, osteoporosis, anemia, chronic sinusitis and GERD who presented to [facility] for evaluation of L ear pain/swelling/erythema, was found to have nonpurulent cellulitis.   1) Nonpurulent cellulitis of L ear Patient has 3 days symptoms of worsening erythema/edema of L pinna, consistent with cellulitis. She was tachycardic in ED but afebrile, w WBC 10.5 and no significant metabolic derangements. Blood pressures stable. CT head shows cellulitic changes of L ear with no inner ear involvement or abscess. She is on chronic daily prednisone (2.5mg ) as well as plaquenil for RA but is not immunosuppressed.  She was given IV vanc/zosyn in ED. As patient is without evidence of systemic toxicity and without abscess on CT scan, it would be reasonable to give her a trial of outpatient antibiotic therapy. She will need to  follow up in the clinic in the next 2 days for determination of response to antibiotic therapy. No need to hold prednisone or plaquenil at this time (discussed with pharmacy).  Plan:  1.  Treat patient with Bactrim 2  DS tablets BID x14 days.  2.  Plan discussed w Dr. Ranae Palms (ED MD) who agreed to discharge home. 3.  Message sent to [facility] to schedule patient with Dr. Garald Braver for appointment 07/27/12 . 4.   Patient instructed to return to the ER or seek medical care if: extension of erythema/edema, any hearing loss, purulent drainage from ear. 5. Recommend continue all medications as prescribed.   Signed: Bronson Curb, MD  PGY-1 Internal Medicine Resident Pager: [phone] ([phone])  07/25/2012, 8:49 PM

## 2012-07-25 NOTE — Progress Notes (Signed)
  Subjective:    Patient ID: Jamie Cox, female    DOB: 11-11-1960, 52 y.o.   MRN: [id number]  HPI Jamie Cox is a 52 year old woman with PMH of RA on chronic Prednisone and Plaquenil, and recent ENT surgery for chronic sinusitis who comes in with 3 days of pain in her left ear. Last night she noticed swelling that rapidly worsened down to her neck and her cheek. She has had low grade fever but has been taking Tylenol for the pain. She denies hearing loss or sore throat. She denies seeing drainage from her ear.    Review of Systems  Constitutional: Positive for fever and fatigue. Negative for chills, activity change and appetite change.  HENT: Positive for ear pain, facial swelling and neck pain. Negative for hearing loss, nosebleeds, congestion, sore throat, rhinorrhea, trouble swallowing, voice change, postnasal drip and ear discharge.   Eyes: Negative for pain and redness.  Respiratory: Negative for cough, chest tightness, shortness of breath, wheezing and stridor.   Cardiovascular: Negative for chest pain, palpitations and leg swelling.  Gastrointestinal: Negative for nausea and vomiting.  Skin: Positive for color change.       Left ear lobe redness  Neurological: Positive for light-headedness and headaches. Negative for dizziness.  Hematological: Positive for adenopathy.  Psychiatric/Behavioral: Negative for behavioral problems, confusion and agitation.       Objective:   Physical Exam  Nursing note and vitals reviewed. Constitutional: She is oriented to person, place, and time. She appears well-nourished. No distress.  Somnolent, she just took her cough medicine  HENT:  Head: Atraumatic.  Nose: Nose normal.  Mouth/Throat: Oropharynx is clear and moist. No oropharyngeal exudate.  Left ear swollen, red, TTP.  Hearing intact bilaterally (whisper test)  Eyes: Conjunctivae are normal. Right eye exhibits no discharge. Left eye exhibits no discharge. No scleral icterus.  Bilateral  dark circles around her eyes  Neck: Neck supple. No tracheal deviation present. No thyromegaly present.  shotty anterior cervical lymph nodes down to supraclavicular area  Erythema surrounding left ear, left cheek, and left neck  Cardiovascular: Regular rhythm.   No murmur heard. tachycardia  Pulmonary/Chest: Effort normal and breath sounds normal. No stridor. No respiratory distress. She has no wheezes. She has no rales. She exhibits no tenderness.  Abdominal: Soft.  Musculoskeletal: She exhibits no edema.  Lymphadenopathy:    She has cervical adenopathy.  Neurological: She is alert and oriented to person, place, and time.  Skin: Skin is warm and dry. She is not diaphoretic. There is erythema.  Surrounding left ear, neck, and cheek  Psychiatric: She has a normal mood and affect.          Assessment & Plan:

## 2012-07-25 NOTE — Assessment & Plan Note (Signed)
Pt needs CT scan, sent to the ED for STAT imaging, blood cultures, CMP, CBC. She may need IV antibiotics and ENT consult given recent ENT surgery.  -Nurse escorted pt to ED. Charge nurse at ED aware.

## 2012-07-27 ENCOUNTER — Ambulatory Visit (INDEPENDENT_AMBULATORY_CARE_PROVIDER_SITE_OTHER): Payer: BC Managed Care – PPO | Admitting: Internal Medicine

## 2012-07-27 ENCOUNTER — Ambulatory Visit: Payer: BC Managed Care – PPO | Admitting: Internal Medicine

## 2012-07-27 ENCOUNTER — Encounter: Payer: Self-pay | Admitting: Internal Medicine

## 2012-07-27 VITALS — BP 110/78 | HR 106 | Temp 97.1°F | Ht 61.0 in | Wt 106.8 lb

## 2012-07-27 DIAGNOSIS — H6012 Cellulitis of left external ear: Secondary | ICD-10-CM

## 2012-07-27 DIAGNOSIS — D509 Iron deficiency anemia, unspecified: Secondary | ICD-10-CM

## 2012-07-27 DIAGNOSIS — H60399 Other infective otitis externa, unspecified ear: Secondary | ICD-10-CM

## 2012-07-27 DIAGNOSIS — R000 Tachycardia, unspecified: Secondary | ICD-10-CM

## 2012-07-27 DIAGNOSIS — D539 Nutritional anemia, unspecified: Secondary | ICD-10-CM

## 2012-07-27 DIAGNOSIS — Z0000 Encounter for general adult medical examination without abnormal findings: Secondary | ICD-10-CM | POA: Insufficient documentation

## 2012-07-27 DIAGNOSIS — J329 Chronic sinusitis, unspecified: Secondary | ICD-10-CM

## 2012-07-27 LAB — CBC WITH DIFFERENTIAL/PLATELET
Eosinophils Absolute: 0 10*3/uL (ref 0.0–0.7)
Eosinophils Relative: 0 % (ref 0–5)
HCT: 31.3 % — ABNORMAL LOW (ref 36.0–46.0)
Hemoglobin: 10.1 g/dL — ABNORMAL LOW (ref 12.0–15.0)
Lymphs Abs: 1.4 10*3/uL (ref 0.7–4.0)
MCH: 22.5 pg — ABNORMAL LOW (ref 26.0–34.0)
MCHC: 32.3 g/dL (ref 30.0–36.0)
MCV: 69.9 fL — ABNORMAL LOW (ref 78.0–100.0)
Monocytes Absolute: 0.8 10*3/uL (ref 0.1–1.0)
Monocytes Relative: 9 % (ref 3–12)
Neutrophils Relative %: 75 % (ref 43–77)
RBC: 4.48 MIL/uL (ref 3.87–5.11)

## 2012-07-27 MED ORDER — FLUTICASONE PROPIONATE 50 MCG/ACT NA SUSP: 2.0000 | Freq: Every day | NASAL | Status: DC

## 2012-07-27 MED ORDER — SALINE NASAL SPRAY 0.65 % NA SOLN: 1.0000 | NASAL | Status: DC | PRN

## 2012-07-27 MED ORDER — FLUTICASONE PROPIONATE 50 MCG/ACT NA SUSP: 1.0000 | Freq: Every day | NASAL | Status: DC

## 2012-07-27 MED ORDER — MELOXICAM 7.5 MG PO TABS: 15.0000 mg | ORAL_TABLET | Freq: Every day | ORAL | Status: DC

## 2012-07-27 NOTE — Progress Notes (Signed)
  Subjective:    Patient ID: Jamie Cox, female    DOB: Nov 08, 1960, 52 y.o.   MRN: [id number]  HPI Jamie Cox is a 52 year old a woman with PMH of RA and chronic sinusitis who comes in for follow up visit for reevaluation of cellulitis of her left external ear. She was seen in the ED following her last visit with CT of the ear negative for abscess and was stable for outpatient treatment of her acute problem. She was discharged home with prescription for Bactrim DS x 14 days which she has been taking faithfully. Today she reports that the pain in the area is almost gone, with minimum swelling. She has, however, experienced frontal headache and sinus pain with dry nasal passages. She denies fever/ chills, decreased appetite, N/V, or diarrhea.    Review of Systems  Constitutional: Negative for fever, chills, diaphoresis, activity change, appetite change, fatigue and unexpected weight change.  HENT: Positive for ear pain, neck pain and sinus pressure. Negative for hearing loss, nosebleeds, congestion, sore throat, facial swelling, rhinorrhea, trouble swallowing, neck stiffness, dental problem, voice change, postnasal drip and ear discharge.   Eyes: Negative for pain and itching.  Respiratory: Negative for cough, chest tightness, shortness of breath and stridor.   Cardiovascular: Negative for chest pain, palpitations and leg swelling.  Gastrointestinal: Negative for abdominal pain.  Genitourinary: Negative for dysuria, frequency and flank pain.  Musculoskeletal: Negative for myalgias.  Skin: Positive for color change. Negative for pallor, rash and wound.       Persistent erythema of left ear lobe  Neurological: Positive for headaches. Negative for dizziness, weakness, light-headedness and numbness.  Hematological: Positive for adenopathy.       Left cervical adenopathy  Psychiatric/Behavioral: Negative for behavioral problems and agitation.       Objective:   Physical Exam  Nursing note and  vitals reviewed. Constitutional: She is oriented to person, place, and time. She appears well-developed and well-nourished. No distress.  HENT:  Left earlobe with minimum edema, much improved since her last visit. Minimum erythema covering her left earlobe, spread to left cheek down to her neck.  Left TM obstructed by cerumen.  Eyes: Right eye exhibits no discharge. Left eye exhibits no discharge. No scleral icterus.  Neck:  Left cervical adenopathy  Cardiovascular:  tachycardia  Pulmonary/Chest: Effort normal. No stridor. No respiratory distress. She has no wheezes. She has no rales. She exhibits no tenderness.  Lymphadenopathy:    She has cervical adenopathy.  Neurological: She is alert and oriented to person, place, and time.  Skin: Skin is warm and dry. No rash noted. She is not diaphoretic. There is erythema. No pallor.  Erythema of left earlobe that spreads to left cheek and down to her neck, unchanged from previous exam but with minimum edema now  Psychiatric: She has a normal mood and affect. Her behavior is normal.          Assessment & Plan:

## 2012-07-27 NOTE — Patient Instructions (Addendum)
General Instructions: -Continue taking Bactrim DS until you finish the course -Throw away used saline spray or any other nasal spray that you used when your earlobe infection started -You need a colonoscopy, we will call you with the appointment.  -Follow up with your ENT doctor next week.   -Use saline spray daily to keep your nasal passages moist -Use Flonase daily as well as indicated by your ENT doctor -You may take Mobic for headache and swelling of your left ear.  -Let us know if your ear swells again or is worse.  -You look so much better today! It was great seeing you again!

## 2012-07-28 DIAGNOSIS — R000 Tachycardia, unspecified: Secondary | ICD-10-CM | POA: Insufficient documentation

## 2012-07-28 NOTE — Assessment & Plan Note (Addendum)
CT scan was negative for abscess, she has been stable for outpatient management. She has started her tx with Bactrim DS BID (day 2/14). She denies fever, she does not appear toxic, the earlobe edema is much improved. She still has TTP over her cheek, earlobe, and neck. She has had frontal headache that have disturbed her sleep.  -CBC with diff with no leukocytosis -Continue Bactrim DS -Mobic 7.5mg  BID for pain control--pt advised to take as needed and to avoid Ibuprofen while taking this -Pt to follow up with [place] next week but also to follow up with her ENT physician for sinusitis and for eval of current earlobe cellulitis

## 2012-07-28 NOTE — Assessment & Plan Note (Addendum)
She has had tachycardia in the past and is currently mildly tachycardic. She reports being asymptomatic with no chest pain, chest discomfort, heart palpitations. This could be 2/2 to her chronic microcytic anemia.  -Checked TSH and fT4 which were 0.36 (nl/low) and 0.94 respectively.

## 2012-07-28 NOTE — Assessment & Plan Note (Signed)
She reports uncomfortable dry nasal passages and some sinus pressure. She was instructed to use FLonase at bedtime and saline spray daily as needed by her ENT physician. She has run out of [employer]. I advised her to discard any nasal sprays she had used at the onset of her earlobe cellulitis to avoid bacterial infection spread to her sinuses.  -Flonase spray prescription given -saline spray daily PRN -She was instructed to call her ENT physician to let them know about her earlobe cellulitis and to follow up with them in regards to her sinusitis.

## 2012-07-28 NOTE — Assessment & Plan Note (Signed)
She states that she has never had a colonoscopy. She had pre-colonoscopy visit with GI almost 3 years ago but never followed through with her appointment. She requests that we schedule her colonoscopy with a group other than [facility].

## 2012-07-28 NOTE — Assessment & Plan Note (Signed)
CBC with Hgb of 10.1, appears to be close to baseline but dropped from Hgb of 11.8 on 2/19. No bleeding reported or noted. Pt never had colonoscopy and agrees to have this done soon.  -Ambulatory referral to GI for colonoscopy

## 2012-07-31 ENCOUNTER — Other Ambulatory Visit: Payer: Self-pay | Admitting: *Deleted

## 2012-07-31 MED ORDER — HYDROCODONE-ACETAMINOPHEN 5-325 MG PO TABS: 1.0000 | ORAL_TABLET | Freq: Four times a day (QID) | ORAL | Status: DC | PRN

## 2012-07-31 NOTE — Telephone Encounter (Signed)
Review of [facility] database reveals a recent Rx for hydrocodone, however, this was post sinus surgery.  Will need to reinforce restrictions of the pain management contract.  She sees Dr. Garald Braver tomorrow, will request that she reinforce the policy.

## 2012-08-01 ENCOUNTER — Encounter: Payer: Self-pay | Admitting: Internal Medicine

## 2012-08-01 ENCOUNTER — Ambulatory Visit (INDEPENDENT_AMBULATORY_CARE_PROVIDER_SITE_OTHER): Payer: BC Managed Care – PPO | Admitting: Internal Medicine

## 2012-08-01 ENCOUNTER — Ambulatory Visit: Payer: BC Managed Care – PPO | Admitting: Internal Medicine

## 2012-08-01 VITALS — BP 111/76 | HR 104 | Temp 96.0°F | Wt 102.5 lb

## 2012-08-01 DIAGNOSIS — R42 Dizziness and giddiness: Secondary | ICD-10-CM | POA: Insufficient documentation

## 2012-08-01 DIAGNOSIS — H6012 Cellulitis of left external ear: Secondary | ICD-10-CM

## 2012-08-01 DIAGNOSIS — D509 Iron deficiency anemia, unspecified: Secondary | ICD-10-CM

## 2012-08-01 DIAGNOSIS — R05 Cough: Secondary | ICD-10-CM

## 2012-08-01 DIAGNOSIS — J329 Chronic sinusitis, unspecified: Secondary | ICD-10-CM

## 2012-08-01 DIAGNOSIS — R000 Tachycardia, unspecified: Secondary | ICD-10-CM

## 2012-08-01 DIAGNOSIS — K219 Gastro-esophageal reflux disease without esophagitis: Secondary | ICD-10-CM | POA: Insufficient documentation

## 2012-08-01 DIAGNOSIS — D539 Nutritional anemia, unspecified: Secondary | ICD-10-CM

## 2012-08-01 DIAGNOSIS — H60399 Other infective otitis externa, unspecified ear: Secondary | ICD-10-CM

## 2012-08-01 DIAGNOSIS — M810 Age-related osteoporosis without current pathological fracture: Secondary | ICD-10-CM

## 2012-08-01 LAB — CBC WITH DIFFERENTIAL/PLATELET
Basophils Absolute: 0 10*3/uL (ref 0.0–0.1)
Eosinophils Absolute: 0.4 10*3/uL (ref 0.0–0.7)
Eosinophils Relative: 6 % — ABNORMAL HIGH (ref 0–5)
HCT: 33.1 % — ABNORMAL LOW (ref 36.0–46.0)
Lymphocytes Relative: 8 % — ABNORMAL LOW (ref 12–46)
MCH: 22.4 pg — ABNORMAL LOW (ref 26.0–34.0)
MCV: 68.1 fL — ABNORMAL LOW (ref 78.0–100.0)
Monocytes Absolute: 0.2 10*3/uL (ref 0.1–1.0)
RDW: 17.9 % — ABNORMAL HIGH (ref 11.5–15.5)
WBC: 7 10*3/uL (ref 4.0–10.5)

## 2012-08-01 LAB — BASIC METABOLIC PANEL WITH GFR
BUN: 19 mg/dL (ref 6–23)
CO2: 22 mEq/L (ref 19–32)
Chloride: 101 mEq/L (ref 96–112)
Creat: 1.06 mg/dL (ref 0.50–1.10)
GFR, Est Non African American: 60 mL/min

## 2012-08-01 MED ORDER — IBANDRONATE SODIUM 150 MG PO TABS: 150.0000 mg | ORAL_TABLET | ORAL | Status: DC

## 2012-08-01 MED ORDER — PANTOPRAZOLE SODIUM 40 MG PO TBEC: 40.0000 mg | DELAYED_RELEASE_TABLET | Freq: Every day | ORAL | Status: DC

## 2012-08-01 NOTE — Assessment & Plan Note (Signed)
She continues to take iron tablets although she has not taken them during this acute illness 2/2 stomach burning sensation.

## 2012-08-01 NOTE — Assessment & Plan Note (Addendum)
Refilled Boniva today with refill x1

## 2012-08-01 NOTE — Assessment & Plan Note (Signed)
She reports dizziness upon standing in the morning. She does not have orthostatic hypotension today but has had recent decreased intake per mouth. In addition, she continues to take Robitussin at night for cough. Pt advised to stand slowly in the mornings when she wakes up and to continue oral rehydration.  -BMET adn CBC today.

## 2012-08-01 NOTE — Assessment & Plan Note (Signed)
Improving. She was seen by ENT this week and has follow up appointment next week.  -Continue saline nasal spray and FLonase

## 2012-08-01 NOTE — Assessment & Plan Note (Signed)
She describes a burning sensation in her stomach for the past 2-3 days. She has not taken Protonix for weeks now.  She was advised to continue taking Protonix while on Prednisone treatment.  Of note, Protonix could interfere with the absorption and benefits of Boniva but her benefits of Protonix therapy outweigh this risk.  -Reordered Protonix

## 2012-08-01 NOTE — Patient Instructions (Addendum)
-  Continue taking Bactrim DS twice per day until you finish it -Start taking Protonix for your stomach discomfort -Be carefull while taking Robitussin as this medication may make you dizzy -Drink plenty of fluids while you are taking Bactrim -We will call you if your lab results are abnormal -Follow up with your ENT -Follow up with [place] after you finish Bactrim but sooner if you develop a fever of temperature greater than 100.55F, if your ear swelling returns, or if you have have nausea and vomiting, being unable to keep foods or liquids down.  -As a reminder, please fill your pain medications (opioids and narcotics) at the same pharmacy you usually buy your prescription medicines.  -Let us know if there is anything we can help you with. The clinic number is [phone].

## 2012-08-01 NOTE — Assessment & Plan Note (Addendum)
She has had tachycardia during multiple visits to the [facility] for over a year now. TSH and fT4 normal. She does have chronic anemia but this likely does not explain her tachycardia. She denies chest pain or palpitations. She complains of dizziness today but this is likely related to acute illness with decrease intake per mouth.  -Continue monitoring. Would consider EKG during her next visit and cardiac work up if tachycardia persists

## 2012-08-01 NOTE — Assessment & Plan Note (Signed)
Almost completely resolved. She started Bactrim DS BID on 2/20 with a 14-day course.  -Follow up with ENT next week -Follow up with [place] on 08/13/12.

## 2012-08-01 NOTE — Progress Notes (Signed)
Subjective:    Patient ID: Jamie Cox, female    DOB: 1960-12-06, 52 y.o.   MRN: [id number]  Abdominal Pain Associated symptoms include diarrhea and headaches. Pertinent negatives include no arthralgias, dysuria, fever, frequency, myalgias, nausea or vomiting.   Jamie Cox is a very pleasant 52 year old woman with PMH of RA, osteoporosis, and chronic sinusitis who comes in for follow up visit for left auricular cellulitis. Today she also complains of feeling tired with burning sensation in her stomach with decreased intake per mouth of foods and fluids because of this problem. She denies nausea or vomiting. She has been dizzy in the mornings but denies syncope or presyncope. She continues to take Bactrim DS for her left earlobe cellulitis and reports soft stools for a few days now. She denies blood in her stool.  She saw her ENT physician yesterday and was told to continue her antibiotic treatment with follow up next week.  She has run out of Protonix and needs refill for Boniva.    Review of Systems  Constitutional: Positive for appetite change and fatigue. Negative for fever, chills, diaphoresis, activity change and unexpected weight change.       Decreased appetite, increased fatigue  HENT: Negative for hearing loss, ear pain, nosebleeds, congestion, sore throat, facial swelling, rhinorrhea, trouble swallowing and neck pain.   Eyes: Negative for pain and itching.  Respiratory: Positive for cough. Negative for chest tightness, shortness of breath, wheezing and stridor.        Cough productive of scant white sputum  Cardiovascular: Negative for chest pain, palpitations and leg swelling.  Gastrointestinal: Positive for diarrhea. Negative for nausea, vomiting and abdominal pain.       Burning in her stomach. Soft stools  Endocrine: Negative for polydipsia and polyuria.  Genitourinary: Negative for dysuria, urgency, frequency, flank pain and difficulty urinating.  Musculoskeletal: Negative  for myalgias, back pain, joint swelling, arthralgias and gait problem.  Skin: Positive for color change and rash. Negative for pallor and wound.       Fading erythema of her left pinna, cheek, and anterior proximal neck  Neurological: Positive for dizziness and headaches. Negative for syncope, facial asymmetry, weakness and light-headedness.       Dizziness in the mornings, headache at night that is improving  Hematological: Positive for adenopathy.       Left cervical  Psychiatric/Behavioral: Negative for behavioral problems and agitation.       Objective:   Physical Exam  Nursing note and vitals reviewed. Constitutional: She is oriented to person, place, and time. She appears well-developed. No distress.  No orthostatic hypotension Tired appearing  HENT:  Mouth/Throat: No oropharyngeal exudate.  Left pinna with darker discoloration/erythema but no edema (much improved since her last visit). Left cheek with darker/erythematous discoloration, left anterior proximal neck with erythema but no swelling (improved since her last visit).   Eyes: Conjunctivae are normal. Right eye exhibits no discharge. Left eye exhibits no discharge.  Neck: Normal range of motion. Neck supple.  Left anterior   Cardiovascular: Normal rate, regular rhythm and normal heart sounds.   Pulmonary/Chest: Effort normal and breath sounds normal. No respiratory distress. She has no wheezes. She has no rales. She exhibits no tenderness.  Abdominal: Soft. Bowel sounds are normal. She exhibits no distension and no mass. There is no tenderness. There is no rebound and no guarding.  Musculoskeletal: Normal range of motion. She exhibits no edema and no tenderness.  Lymphadenopathy:    She has cervical adenopathy.  Neurological: She is alert and oriented to person, place, and time.  Skin: Skin is warm and dry. No rash noted. She is not diaphoretic. There is erythema. No pallor.  Psychiatric: She has a normal mood and affect.  Her behavior is normal.          Assessment & Plan:

## 2012-08-01 NOTE — Assessment & Plan Note (Signed)
She has cough with scant white mucus. She has been taking Robitussin at night with good relief or her cough.

## 2012-08-02 ENCOUNTER — Telehealth: Payer: Self-pay | Admitting: *Deleted

## 2012-08-02 ENCOUNTER — Telehealth: Payer: Self-pay | Admitting: Internal Medicine

## 2012-08-02 NOTE — Telephone Encounter (Signed)
Pt called today with c/o red rash with itching (on left arm,abd and chest ) also She c/o nausea in AM and feels it is from antibiotic she was started on. No appetite, She is taking septra DS stated on 2/20. Ear pain is improving. Please call pt with advise.  # [phone]

## 2012-08-02 NOTE — Telephone Encounter (Signed)
Ms. Co had called the triage nurse earlier to inform that she noticed a red rash on her left arm, chest, and abdomen.  I called and spoke with Md. Jimmey Ralph. She informs that she had not noticed the rash yesterday but noticed it this morning after she had mild pruritis in her arm and abdomen. Currently she reports that the itching is improving. She did not take Bactrim DS this morning, this would be the 8th day of her antibiotic therapy. She states that her left ear pain and swelling is completely resolved now. She has had soft stools for two days now while on Bactrim. Her stomach burning is still present but she did fill her prescription for Prilosec. She is still feeling sick but is able to eat and drink. Her 54 year old daughter is in the house and helping her some.  Plan: -Stop Bactrim DS as auricular cellulitis is resolved and pt now likely has a rash and soft stools 2/2 Bactrim use -She will call us tomorrow if her rash is getting worse as she may need to be seen in the clinic -If her auricular cellulitis restarts, would give prescription for doxycycline for coverage of community acquired MRSA.  -The patient agreed and understood the plan.

## 2012-08-02 NOTE — Telephone Encounter (Signed)
Pt has been called. Thanks! SK

## 2012-08-07 ENCOUNTER — Other Ambulatory Visit: Payer: Self-pay | Admitting: *Deleted

## 2012-08-07 DIAGNOSIS — M810 Age-related osteoporosis without current pathological fracture: Secondary | ICD-10-CM

## 2012-08-07 MED ORDER — IBANDRONATE SODIUM 150 MG PO TABS: 150.0000 mg | ORAL_TABLET | ORAL | Status: DC

## 2012-08-07 NOTE — Telephone Encounter (Signed)
Rx called in to pharmacy. 

## 2012-08-08 ENCOUNTER — Encounter: Payer: Self-pay | Admitting: Gastroenterology

## 2012-08-13 ENCOUNTER — Ambulatory Visit (INDEPENDENT_AMBULATORY_CARE_PROVIDER_SITE_OTHER): Payer: BC Managed Care – PPO | Admitting: Internal Medicine

## 2012-08-13 ENCOUNTER — Encounter: Payer: Self-pay | Admitting: Internal Medicine

## 2012-08-13 VITALS — BP 118/74 | HR 100 | Temp 97.6°F | Ht 61.0 in | Wt 104.6 lb

## 2012-08-13 DIAGNOSIS — H60399 Other infective otitis externa, unspecified ear: Secondary | ICD-10-CM

## 2012-08-13 DIAGNOSIS — M810 Age-related osteoporosis without current pathological fracture: Secondary | ICD-10-CM

## 2012-08-13 DIAGNOSIS — M069 Rheumatoid arthritis, unspecified: Secondary | ICD-10-CM

## 2012-08-13 DIAGNOSIS — Z0000 Encounter for general adult medical examination without abnormal findings: Secondary | ICD-10-CM

## 2012-08-13 DIAGNOSIS — Z13220 Encounter for screening for lipoid disorders: Secondary | ICD-10-CM

## 2012-08-13 DIAGNOSIS — H6012 Cellulitis of left external ear: Secondary | ICD-10-CM

## 2012-08-13 LAB — LIPID PANEL
Total CHOL/HDL Ratio: 2.1 Ratio
VLDL: 21 mg/dL (ref 0–40)

## 2012-08-13 NOTE — Patient Instructions (Addendum)
General Instructions: -Follow up with your Rheumatologist -Follow up with your appointment for colonoscopy -They will call you with information regarding the Dexa scan appointment -Continue using your albuterol inhaler for occasional shortness of breath/cough -Follow up with [place] in 6 months or sooner if you become sick  -It was great seeing you again!

## 2012-08-13 NOTE — Assessment & Plan Note (Signed)
Completely resolved now with minimum peeling of the skin of her left cheek and left pinna.

## 2012-08-13 NOTE — Assessment & Plan Note (Addendum)
Patient encouraged to follow up with her Rheumatologist for duration of Prednisone therapy. She has occasional joint pain. Patient given phone numbers for [employer] and brief information regarding water aerobics and scholarship for the [facility].

## 2012-08-13 NOTE — Progress Notes (Signed)
  Subjective:    Patient ID: Jamie Cox, female    DOB: April 07, 1961, 52 y.o.   MRN: [id number]  HPI Jamie Cox is a pleasant 52 year old woman with PMH listed below who comes in for follow up visit for left auricular cellulitis and drug rash. During her last visit she was on Bactrim treatment for cellulitis of her left pinna. The cellulitis has remarkably improved, however, the day following that visit she noted a pruritic red rash in her abdomen, and her arms bilaterally. She called the [facility] and I instructed her to stop taking Bactrim. The rash completely subsided in 1-2 days of discontinuation of Bactrim.  She has no other complaints today.     Review of Systems  Constitutional: Negative for fever, chills, diaphoresis, activity change, appetite change, fatigue and unexpected weight change.  HENT: Negative for hearing loss, ear pain, nosebleeds, congestion, sore throat, facial swelling, rhinorrhea, neck pain, postnasal drip, tinnitus and ear discharge.   Eyes: Negative for pain.  Respiratory: Positive for cough and shortness of breath. Negative for choking and wheezing.        She has coughing spells with shortness of breath  Cardiovascular: Negative for chest pain, palpitations and leg swelling.  Gastrointestinal: Negative for abdominal pain and diarrhea.  Musculoskeletal: Negative for back pain and arthralgias.  Skin: Negative for color change, pallor, rash and wound.  Neurological: Negative for dizziness, tremors, syncope, weakness and headaches.  Psychiatric/Behavioral: Negative for agitation.       Objective:   Physical Exam  Nursing note and vitals reviewed. Constitutional: She is oriented to person, place, and time. No distress.  Thin, well-dressed  HENT:  Right Ear: External ear normal.  Left Ear: External ear normal.  Nose: Nose normal.  Skin over left pinna, and left cheek shiny, with scant small areas of peeling skin.   Eyes: Conjunctivae are normal. No scleral icterus.   Cardiovascular: Normal rate and regular rhythm.   Pulmonary/Chest: Effort normal and breath sounds normal. No respiratory distress. She has no wheezes. She has no rales. She exhibits no tenderness.  Musculoskeletal: She exhibits no edema and no tenderness.  Lymphadenopathy:    She has no cervical adenopathy.  Neurological: She is alert and oriented to person, place, and time.  Skin: Skin is warm and dry. No rash noted. She is not diaphoretic. No erythema. No pallor.  Left cheek and external ear with no edema or erythema.   Psychiatric: She has a normal mood and affect. Her behavior is normal.          Assessment & Plan:

## 2012-08-13 NOTE — Assessment & Plan Note (Signed)
Will check lipid profile today as she has not had this in >5years.

## 2012-08-13 NOTE — Assessment & Plan Note (Addendum)
She has restarted Boniva, on t since April 2013. Her last Dexa scan was in 2008.  -Ordered Dexa scan.

## 2012-08-15 ENCOUNTER — Other Ambulatory Visit: Payer: Self-pay | Admitting: *Deleted

## 2012-08-15 MED ORDER — PREDNISONE 2.5 MG PO TABS: 2.5000 mg | ORAL_TABLET | Freq: Every day | ORAL | Status: DC

## 2012-09-10 ENCOUNTER — Encounter: Payer: Self-pay | Admitting: Gastroenterology

## 2012-09-10 ENCOUNTER — Ambulatory Visit (AMBULATORY_SURGERY_CENTER): Payer: BC Managed Care – PPO | Admitting: *Deleted

## 2012-09-10 VITALS — Ht 61.0 in | Wt 105.0 lb

## 2012-09-10 DIAGNOSIS — Z1211 Encounter for screening for malignant neoplasm of colon: Secondary | ICD-10-CM

## 2012-09-10 MED ORDER — MOVIPREP 100 G PO SOLR
ORAL | Status: DC
Start: 2012-09-10 — End: 2012-09-19

## 2012-09-10 NOTE — Progress Notes (Deleted)
YOU WILL BE RECEIVING 4 PHONE CALLS FROM [facility] TO EXPEDITE THE ADMITTING PROCESS- IF YOU MISS ONE, PLEASE CALL BACK ASAP  2 DAY PREPARATION FOR COLONOSCOPY WITH MOVIPREP AND MIRALAX

## 2012-09-19 ENCOUNTER — Encounter: Payer: Self-pay | Admitting: Internal Medicine

## 2012-09-19 ENCOUNTER — Ambulatory Visit (INDEPENDENT_AMBULATORY_CARE_PROVIDER_SITE_OTHER): Payer: BC Managed Care – PPO | Admitting: Internal Medicine

## 2012-09-19 VITALS — BP 120/78 | HR 91 | Temp 97.9°F | Ht 61.0 in | Wt 108.0 lb

## 2012-09-19 DIAGNOSIS — R05 Cough: Secondary | ICD-10-CM

## 2012-09-19 DIAGNOSIS — M0510 Rheumatoid lung disease with rheumatoid arthritis of unspecified site: Secondary | ICD-10-CM

## 2012-09-19 NOTE — Progress Notes (Signed)
Subjective:    Patient ID: Jamie Cox, female    DOB: [birth year] .   MRN: [id number]  HPI Primary = [facility]/  Dr. Darnelle Maffucci    21 yobf never smoker dx RA in her early 20's steroid dep x around 2000 but also mtx, plaquenil in past per Dr Coral Spikes and also seen [facility] and due to f/u at [facility] with ? 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   August 10, 2010 1st pulmonary office eval cc for cough starting around 2006 but much worse x 4 months with constant need to clear the throat, mucus is clear, cough so hard looses breath. Much worse then ended in ER with dx of pna with cp on R rx as outpt with abx and cp now gone but still coughing. arthritis worse x 4 months and plans to go establish at [facility].  Imp was Boop plus/minus uacs rec 1) Prednsione 10 mg 4 each am x 2days, 2x2days, 1x2days and stop and resume prednisone 2.5 mg daily  2) Stop boniva  3) Start Nexium Take one 30-60 min before first meal of the day and also Zantac 150 mg one at bedtime  4) GERD (REFLUX) diet   09/14/10  ov cc cough dry day = night,  pred now at 2.5 mg per day cough was better on the higher doses but plan is to start plaquenil this week per [facility].  Mostly dry.   >>Sinus CT pos+>Augmentin x 10d , zantac At bedtime    10/05/10 Follow up  Pt returns for follow up . CT scan last ov was positive for acute sinusitis. She was treated with 10 days of Augmentin. She is feeling some better with decreased cough and congestion . Steroids set w/ floor of 10 and ceiling of 20mg . She has not tapered down to 10mg  b/c breathing not back to her baseline.  rec Remember to taper Prednisone 10 mg take two daily until breathing and cough better then 10 mg daily May use Mucinex DM Twice daily  For cough/congestion As needed   follow up Dr. Sherene Sires  In 3 weeks with PFTS -lung function test.     12/23/2010 ov/Miken Stecher  tapered per rheum to  2.5 prednisone per day,  One episode of cp post on R same location as before but not as intense  and better since the z pack, not consistent with ppi ac but is holding boniva. Overall better but concerned cough still intermittently present. No purulent sputum. rec For cough take mucinex dm twice daily( 1200mg  every 12 hours) and if this fails use vicodin Protonix 40 mg Take 30-60 min before first meal of the day and Zantac at bedtime perfectly regularly for a month Please see patient coordinator before you leave today  to schedule for CT chest and sinus > improved   05/24/2011 f/u ov/Roxie Kreeger on 2.5 mg Prednisone  cc worse dry  cough x sev months p traveling by car to [facility]. Cough so severe gets light headed, ? Better p saba,  Worse if get overheated, no excess or purulent sputum or subj wheezing. rec Prednisone 10mg  take 2 until better, then 1 daily x one week then one half daily thereafter - if worse start back at 2 daily  09/21/2011 f/u ov/Mathhew Buysse 2 pred daily > fine,  1 daily was fine, half daily also ok then started 2.5 mg per day and worse dry cough since with no rash, arthralgias or sign  Limiting sob or overt hb still holding  boniva. No overt sinus symptoms. rec Prednisone 10 mg 2 daily until better, 1 daily for a week, then one half daily but if any worse cough or breathing go back to the previous dose that worked Please schedule a follow up visit in 3 months but call sooner if needed with your annual pft's due then  03/22/2012 f/u ov/Mardelle Pandolfi on prednisone 2.5 mg day / followed by Rheum at  [facility]  and needing the saba not at all. rec No change in medications Only use your albuterol as a rescue medication to be used if you can't catch your breath by resting or doing a relaxed purse lip breathing pattern. The less you use it, the better it will work when you need it.   Sinus surgery in Jan 2014 > marked improvement   09/19/2012 f/u ov/Mathew Postiglione re chronic cough Chief Complaint  Patient presents with  . Follow-up    Cough still bothers her after she eats or drinks, mainly non prod cough.   She still has frequent throat clearing.   more day than night more dry than wet at pred 2.5 mg a day for RA cough worse if miss acid suppression, not using saba but when she does it doesn't help the cough.  No obvious daytime variabilty or assoc sob, cp or chest tightness, subjective wheeze overt sinus or hb symptoms. No unusual exp hx  Sleeping ok without nocturnal  or early am exacerbation  of respiratory  c/o's or need for noct saba. Also denies any obvious fluctuation of symptoms with weather or environmental changes or other aggravating or alleviating factors except as outlined above   ROS  The following are not active complaints unless bolded sore throat, dysphagia, dental problems, itching, sneezing,  nasal congestion or excess/ purulent secretions, ear ache,   fever, chills, sweats, unintended wt loss, pleuritic or exertional cp, hemoptysis,  orthopnea pnd or leg swelling, presyncope, palpitations, heartburn, abdominal pain, anorexia, nausea, vomiting, diarrhea  or change in bowel or urinary habits, change in stools or urine, dysuria,hematuria,  rash, arthralgias, visual complaints, headache, numbness weakness or ataxia or problems with walking or coordination,  change in mood/affect or memory.              Objective:   Physical Exam  Chronically ill bf nad mod cushingnoid. Wt 105 12/23/2010 > 05/24/2011  105 > 09/21/2011  102> 03/22/2012  115 > 108 09/19/2012  HEENT: nl dentition, turbinates, and orophanx. Nl external ear canals without cough reflex  NECK : without JVD/Nodes/TM/ nl carotid upstrokes bilaterally  LUNGS: no acc muscle use, clear to A and P bilaterally without cough on insp or exp maneuvers  CV: RRR no s3 or murmur or increase in P2, no edema .  ABD: soft and nontender with nl excursion in the supine position. No bruits or organomegaly, bowel sounds nl  MS: warm with classic moderate RA changes hands no calf tenderness, cyanosis or clubbing        CXR   01/27/12 Stable chronic changes at the lung bases. No new cardiopulmonary  abnormality.      Assessment & Plan:

## 2012-09-19 NOTE — Patient Instructions (Addendum)
Stop benadryl and add chlortrimeton 4 mg one at bedtime to see if it helps the cough but if so ok to use daytime but may make you drowsy - if sinuses are still draining then return to see your sinus doctor = Dr Marcene Corning is a yearly form of boniva that is given IV and cannot cause cough so I recommend it as an alternative for the next year - call us- Tammy NP- if you want [place] to arrange the injection - then if you have the same cough for the next year we'll know it wasn't the boniva (otherwise no way to be sure)  Return here if you're still coughing after the above steps are completed

## 2012-09-21 NOTE — Assessment & Plan Note (Signed)
CT sinus 09/14/10 Positive >10 d of Augmentin - Recheck sinus ct 12/24/10 > improved  - Improved p sinus surgery 06/2012   Back on boniva with persistent sensation of pnds typical of  Classic Upper airway cough syndrome, so named because it's frequently impossible to sort out how much is  CR/sinusitis with freq throat clearing (which can be related to primary GERD)   vs  causing  secondary (" extra esophageal")  GERD from wide swings in gastric pressure that occur with throat clearing, often  promoting self use of mint and menthol lozenges that reduce the lower esophageal sphincter tone and exacerbate the problem further in a cyclical fashion.   These are the same pts (now being labeled as having "irritable larynx syndrome" by some cough centers) who not infrequently have a history of having failed to tolerate ace inhibitors,  dry powder inhalers or biphosphonates or report having atypical reflux symptoms that don't respond to standard doses of PPI , and are easily confused as having aecopd or asthma flares by even experienced allergists/ pulmonologists.   For now rec add 1st gen H1 per guidelines and consider one year off biphosphonates by using a single dose of Reclast IV and avoiding confusion in the interpretation of non-specific upper airway symptoms.

## 2012-09-21 NOTE — Assessment & Plan Note (Signed)
-   CT 12/24/10 Stable right lower lobe 7 mm pulmonary nodule.  2. Improved chronic interstitial lung disease with differential  considerations discussed above. Favor cryptogenic organizing  pneumonia or sarcoidosis.  - PFT's 12/23/2010 VC   2.13 (73%) DLCO 56 corrects to 89%  - PFT's 03/22/2012 VC 2.32 (80%) no obst, dlco 71 corrects to 100  No clinical evidence of progression. A general rule of thumb is that if the systemic dz is well controlled then the lung dz will be too  See instructions for specific recommendations which were reviewed directly with the patient who was given a copy with highlighter outlining the key components.

## 2012-09-24 ENCOUNTER — Encounter: Payer: BC Managed Care – PPO | Admitting: Gastroenterology

## 2012-09-27 ENCOUNTER — Other Ambulatory Visit: Payer: Self-pay | Admitting: *Deleted

## 2012-09-28 MED ORDER — GUAIFENESIN-CODEINE 100-10 MG/5ML PO SYRP: 5.0000 mL | ORAL_SOLUTION | Freq: Three times a day (TID) | ORAL | Status: DC | PRN

## 2012-10-01 NOTE — Telephone Encounter (Signed)
Robitussin-AC called to [facility] pharmacy.

## 2012-10-11 ENCOUNTER — Telehealth: Payer: Self-pay | Admitting: Gastroenterology

## 2012-10-11 NOTE — Telephone Encounter (Signed)
Spoke with pt and she states she can not afford to pay $70.00 for the Moviprep. Pt was informed to come to our office to pick up a voucher for a free Moviprep. A new Rx for  Moviprep will be called into [facility] at [phone].Pt understood. She will call if she has further questions

## 2012-10-15 ENCOUNTER — Telehealth: Payer: Self-pay | Admitting: Gastroenterology

## 2012-10-15 ENCOUNTER — Ambulatory Visit (AMBULATORY_SURGERY_CENTER): Payer: BC Managed Care – PPO | Admitting: Gastroenterology

## 2012-10-15 ENCOUNTER — Encounter: Payer: Self-pay | Admitting: Gastroenterology

## 2012-10-15 VITALS — BP 110/74 | HR 100 | Temp 97.0°F | Resp 22 | Ht 61.0 in | Wt 105.0 lb

## 2012-10-15 DIAGNOSIS — Z1211 Encounter for screening for malignant neoplasm of colon: Secondary | ICD-10-CM

## 2012-10-15 MED ORDER — SODIUM CHLORIDE 0.9 % IV SOLN: 500.0000 mL | INTRAVENOUS | Status: DC

## 2012-10-15 NOTE — Telephone Encounter (Signed)
Message copied by Arna Snipe on Mon Oct 15, 2012  9:27 AM ------      Message from: Ok Anis A      Created: Mon Sep 24, 2012  8:49 AM       Please charge patient.      No showed same day for colonoscopy  ------

## 2012-10-15 NOTE — Progress Notes (Signed)
Patient did not experience any of the following events: a burn prior to discharge; a fall within the facility; wrong site/side/patient/procedure/implant event; or a hospital transfer or hospital admission upon discharge from the facility. (G8907) Patient did not have preoperative order for IV antibiotic SSI prophylaxis. (G8918)  

## 2012-10-15 NOTE — Progress Notes (Deleted)
Patient did not experience any of the following events: a burn prior to discharge; a fall within the facility; wrong site/side/patient/procedure/implant event; or a hospital transfer or hospital admission upon discharge from the facility. (G8907)Patient did not experience any of the following events: a burn prior to discharge; a fall within the facility; wrong site/side/patient/procedure/implant event; or a hospital transfer or hospital admission upon discharge from the facility. (G8907)Patient with preoperative order for IV antibiotic SSI prophylaxis, antibiotic initiated on time. [phone])

## 2012-10-15 NOTE — Patient Instructions (Signed)
YOU HAD AN ENDOSCOPIC PROCEDURE TODAY AT THE [facility]: Refer to the procedure report that was given to you for any specific questions about what was found during the examination.  If the procedure report does not answer your questions, please call your gastroenterologist to clarify.  If you requested that your care partner not be given the details of your procedure findings, then the procedure report has been included in a sealed envelope for you to review at your convenience later.  YOU SHOULD EXPECT: Some feelings of bloating in the abdomen. Passage of more gas than usual.  Walking can help get rid of the air that was put into your GI tract during the procedure and reduce the bloating. If you had a lower endoscopy (such as a colonoscopy or flexible sigmoidoscopy) you may notice spotting of blood in your stool or on the toilet paper. If you underwent a bowel prep for your procedure, then you may not have a normal bowel movement for a few days.  DIET: Your first meal following the procedure should be a light meal and then it is ok to progress to your normal diet.  A half-sandwich or bowl of soup is an example of a good first meal.  Heavy or fried foods are harder to digest and may make you feel nauseous or bloated.  Likewise meals heavy in dairy and vegetables can cause extra gas to form and this can also increase the bloating.  Drink plenty of fluids but you should avoid alcoholic beverages for 24 hours.  ACTIVITY: Your care partner should take you home directly after the procedure.  You should plan to take it easy, moving slowly for the rest of the day.  You can resume normal activity the day after the procedure however you should NOT DRIVE or use heavy machinery for 24 hours (because of the sedation medicines used during the test).    SYMPTOMS TO REPORT IMMEDIATELY: A gastroenterologist can be reached at any hour.  During normal business hours, 8:30 AM to 5:00 PM Monday through Friday,  call [phone].  After hours and on weekends, please call the GI answering service at [phone] who will take a message and have the physician on call contact you.   Following lower endoscopy (colonoscopy or flexible sigmoidoscopy):  Excessive amounts of blood in the stool  Significant tenderness or worsening of abdominal pains  Swelling of the abdomen that is new, acute  Fever of 100F or higher    FOLLOW UP: If any biopsies were taken you will be contacted by phone or by letter within the next 1-3 weeks.  Call your gastroenterologist if you have not heard about the biopsies in 3 weeks.  Our staff will call the home number listed on your records the next business day following your procedure to check on you and address any questions or concerns that you may have at that time regarding the information given to you following your procedure. This is a courtesy call and so if there is no answer at the home number and we have not heard from you through the emergency physician on call, we will assume that you have returned to your regular daily activities without incident.  SIGNATURES/CONFIDENTIALITY: You and/or your care partner have signed paperwork which will be entered into your electronic medical record.  These signatures attest to the fact that that the information above on your After Visit Summary has been reviewed and is understood.  Full responsibility of the confidentiality   of this discharge information lies with you and/or your care-partner.   Normal exam,repeat colonoscopy in 10 yrs

## 2012-10-15 NOTE — Op Note (Signed)
[facility] [address] [place], [address]   COLONOSCOPY PROCEDURE REPORT  PATIENT: Jamie Cox, Jamie Cox  MR#: [id number] BIRTHDATE: 1961/05/23 , 52  yrs. old GENDER: Female ENDOSCOPIST: Mardella Layman, MD, Clementeen Graham REFERRED BY:  Rolanda Lundborg, M.D. PROCEDURE DATE:  10/15/2012 PROCEDURE:   Colonoscopy, screening ASA CLASS:   Class II INDICATIONS:Average risk patient for colon cancer and Colorectal cancer screening. MEDICATIONS: propofol (Diprivan) 150mg  IV  DESCRIPTION OF PROCEDURE:   After the risks and benefits and of the procedure were explained, informed consent was obtained.  A digital rectal exam revealed no abnormalities of the rectum.    The LB CF-[id number]  endoscope was introduced through the anus and advanced to the cecum, which was identified by both the appendix and ileocecal valve .  The quality of the prep was excellent, using MoviPrep .  The instrument was then slowly withdrawn as the colon was fully examined.     COLON FINDINGS: A normal appearing cecum, ileocecal valve, and appendiceal orifice were identified.  The ascending, hepatic flexure, transverse, splenic flexure, descending, sigmoid colon and rectum appeared unremarkable.  No polyps or cancers were seen. Retroflexed views revealed no abnormalities.     The scope was then withdrawn from the patient and the procedure completed.  COMPLICATIONS: There were no complications. ENDOSCOPIC IMPRESSION: Normal colon...no polyps noted.  RECOMMENDATIONS: 1.  Continue current medications 2.  Continue current colorectal screening recommendations for "routine risk" patients with a repeat colonoscopy in 10 years.   REPEAT EXAM:  cc:  _______________________________ eSignedMardella Layman, MD, [facility] 10/15/2012 1:47 PM

## 2012-10-16 ENCOUNTER — Telehealth: Payer: Self-pay | Admitting: *Deleted

## 2012-10-16 NOTE — Telephone Encounter (Signed)
No answer, message left for the patient. 

## 2012-11-02 ENCOUNTER — Other Ambulatory Visit: Payer: Self-pay | Admitting: *Deleted

## 2012-11-05 ENCOUNTER — Other Ambulatory Visit: Payer: Self-pay | Admitting: Internal Medicine

## 2012-11-05 MED ORDER — HYDROCODONE-ACETAMINOPHEN 5-325 MG PO TABS: 1.0000 | ORAL_TABLET | Freq: Four times a day (QID) | ORAL | Status: DC | PRN

## 2012-11-05 NOTE — Telephone Encounter (Signed)
Rx called in 

## 2012-11-06 ENCOUNTER — Other Ambulatory Visit: Payer: Self-pay | Admitting: *Deleted

## 2012-11-07 MED ORDER — PREDNISONE 2.5 MG PO TABS: 2.5000 mg | ORAL_TABLET | Freq: Every day | ORAL | Status: DC

## 2013-04-11 ENCOUNTER — Other Ambulatory Visit: Payer: Self-pay | Admitting: Internal Medicine

## 2013-07-22 ENCOUNTER — Encounter: Payer: Self-pay | Admitting: Internal Medicine

## 2013-07-22 ENCOUNTER — Ambulatory Visit (INDEPENDENT_AMBULATORY_CARE_PROVIDER_SITE_OTHER): Payer: BC Managed Care – PPO | Admitting: Internal Medicine

## 2013-07-22 VITALS — BP 158/90 | HR 76 | Temp 96.7°F | Ht 61.0 in | Wt 100.3 lb

## 2013-07-22 DIAGNOSIS — M810 Age-related osteoporosis without current pathological fracture: Secondary | ICD-10-CM

## 2013-07-22 DIAGNOSIS — R053 Chronic cough: Secondary | ICD-10-CM

## 2013-07-22 DIAGNOSIS — M069 Rheumatoid arthritis, unspecified: Secondary | ICD-10-CM

## 2013-07-22 DIAGNOSIS — R05 Cough: Secondary | ICD-10-CM

## 2013-07-22 DIAGNOSIS — Z0000 Encounter for general adult medical examination without abnormal findings: Secondary | ICD-10-CM

## 2013-07-22 DIAGNOSIS — K219 Gastro-esophageal reflux disease without esophagitis: Secondary | ICD-10-CM

## 2013-07-22 DIAGNOSIS — J329 Chronic sinusitis, unspecified: Secondary | ICD-10-CM

## 2013-07-22 DIAGNOSIS — D539 Nutritional anemia, unspecified: Secondary | ICD-10-CM

## 2013-07-22 DIAGNOSIS — R059 Cough, unspecified: Secondary | ICD-10-CM

## 2013-07-22 DIAGNOSIS — Z1231 Encounter for screening mammogram for malignant neoplasm of breast: Secondary | ICD-10-CM

## 2013-07-22 MED ORDER — FLUTICASONE PROPIONATE 50 MCG/ACT NA SUSP: 2.0000 | Freq: Every day | NASAL | Status: DC

## 2013-07-22 MED ORDER — PANTOPRAZOLE SODIUM 40 MG PO TBEC: 40.0000 mg | DELAYED_RELEASE_TABLET | Freq: Every day | ORAL | Status: DC

## 2013-07-22 MED ORDER — PREDNISONE 2.5 MG PO TABS: 2.5000 mg | ORAL_TABLET | Freq: Every day | ORAL | Status: DC

## 2013-07-22 MED ORDER — IBANDRONATE SODIUM 150 MG PO TABS: 150.0000 mg | ORAL_TABLET | ORAL | Status: DC

## 2013-07-22 MED ORDER — RANITIDINE HCL 300 MG PO TABS: 150.0000 mg | ORAL_TABLET | Freq: Every day | ORAL | Status: DC

## 2013-07-22 MED ORDER — HYDROCODONE-ACETAMINOPHEN 5-325 MG PO TABS
1.0000 | ORAL_TABLET | Freq: Four times a day (QID) | ORAL | Status: DC | PRN
Start: 2013-07-22 — End: 2013-07-22

## 2013-07-22 MED ORDER — DICLOFENAC SODIUM 75 MG PO TBEC: 75.0000 mg | DELAYED_RELEASE_TABLET | Freq: Two times a day (BID) | ORAL | Status: DC | PRN

## 2013-07-22 MED ORDER — HYDROCODONE-ACETAMINOPHEN 5-325 MG PO TABS: 1.0000 | ORAL_TABLET | Freq: Four times a day (QID) | ORAL | Status: DC | PRN

## 2013-07-22 MED ORDER — RANITIDINE HCL 300 MG PO TABS
150.0000 mg | ORAL_TABLET | Freq: Every day | ORAL | Status: DC
Start: 2013-07-22 — End: 2013-07-22

## 2013-07-22 NOTE — Assessment & Plan Note (Signed)
Stable.  Follows with Pulmonary.  Encouraged her to start back her maintenance medications, flonase, protonix, ranitidine.

## 2013-07-22 NOTE — Progress Notes (Signed)
Subjective:    Patient ID: Jamie Cox, female    DOB: 1960-10-25, 53 y.o.   MRN: [id number]  CC: routine follow up  HPI  Jamie Cox is a 53yo woman with PMH of anemia, RA with rheumatoid lung disease, chronic cough (follows with [facility] in Pulmonology), osteoporosis.    Jamie Cox has not been seen in clinic for a while, likely due to not working.    She has now started back to temporary work, having to take pain medications a little more due to working.  Also stopped taking prednisone "until she needed it."  She is on chronic steroid therapy, per review of Rheumatology note it has been assumed that the prednisone was being managed through this clinic.  However, I have not been managing this medication.  She reports that she has been maintained on prednisone 2.5mg  for "a long time" due to not being able to be weaned off.  She is also only intermittently on other RA controlling medications.  She is not currently taking plaquenil due to not getting her eye exam - no insurance to get.  She needs to reapply for medicaid.  Raynaud's worse b/c of cold.  She has been out of her pain medications and has been taking tylenol and ibuprofen.  She is requesting refills.    She is having continued sinus drainage and sinus pressure, but this seems improved recently.  She has chronic cough syndrome and chronic sinusitis, but this seems to be well controlled today. She has not been regularly taking her flonase, protonix or ranitidine.  I encouraged her to continue taking all of her medications as prescribed.   She is taking boniva, interested in once yearly shot.  Will look in to how to order this for short stay/infusion.    She is due for a mammogram and needs to reschedule.   Medications were reviewed.   Not smoking  Current Outpatient Prescriptions on File Prior to Visit  Medication Sig Dispense Refill  . acetaminophen (TYLENOL) 650 MG CR tablet Take 650 mg by mouth every 8 (eight) hours as needed. For  pain      . calcium-vitamin D 250-100 MG-UNIT per tablet Take 1 tablet by mouth 2 (two) times daily.        Marland Kitchen dextromethorphan-guaiFENesin (MUCINEX DM) 30-600 MG per 12 hr tablet Take 1 tablet by mouth every 12 (twelve) hours as needed.       . diclofenac (VOLTAREN) 75 MG EC tablet TAKE ONE TABLET BY MOUTH TWICE DAILY AS NEEDED FOR PAIN  30 tablet  0  . fluticasone (FLONASE) 50 MCG/ACT nasal spray Place 2 sprays into the nose daily.  16 g  1  . HYDROcodone-acetaminophen (NORCO/VICODIN) 5-325 MG per tablet Take 1 tablet by mouth every 6 (six) hours as needed for pain.  30 tablet  1  . ibandronate (BONIVA) 150 MG tablet Take 1 tablet (150 mg total) by mouth every 30 (thirty) days. Take in the AM with a full glass of water, on empty stomach, and don't lie down for the next 30 min or take anything else by mouth.  1 tablet  2  . Multiple Vitamin (MULTIVITAMIN) capsule Take 1 capsule by mouth daily.        . pantoprazole (PROTONIX) 40 MG tablet Take 1 tablet (40 mg total) by mouth daily.  30 tablet  11  . predniSONE (DELTASONE) 2.5 MG tablet TAKE ONE TABLET BY MOUTH ONCE DAILY  30 tablet  2  . ranitidine (  ZANTAC) 300 MG tablet TAKE ONE TABLET BY MOUTH EVERY DAY AT BEDTIME  30 tablet  0  . sodium chloride (OCEAN NASAL SPRAY) 0.65 % nasal spray Place 1 spray into the nose as needed for congestion.  45 mL  3  . albuterol (PROVENTIL HFA;VENTOLIN HFA) 108 (90 BASE) MCG/ACT inhaler Inhale 2 puffs into the lungs every 6 (six) hours as needed. For shortness of breath/wheezing      . diphenhydrAMINE (BENADRYL) 25 mg capsule Take 25-50 mg by mouth 2 (two) times daily as needed for itching. Pt will take 1 capsule during the day and 2 caps at night when needed.      . folic acid (FOLVITE) 1 MG tablet Take 1 mg by mouth daily.        Marland Kitchen guaiFENesin-codeine (ROBITUSSIN AC) 100-10 MG/5ML syrup Take 5 mLs by mouth 3 (three) times daily as needed for cough.  120 mL  0  . hydroxychloroquine (PLAQUENIL) 200 MG tablet Take 1  tablet by mouth 2 (two) times daily.       No current facility-administered medications on file prior to visit.     Review of Systems  Constitutional: Negative for fever and chills.  HENT: Positive for postnasal drip and rhinorrhea. Negative for dental problem and trouble swallowing.   Eyes: Negative for photophobia and visual disturbance.  Respiratory: Positive for cough (chronic). Negative for shortness of breath and wheezing.   Cardiovascular: Negative for chest pain and leg swelling.  Gastrointestinal: Negative for abdominal pain, diarrhea, constipation and blood in stool.  Genitourinary: Negative for dysuria and difficulty urinating.  Musculoskeletal: Positive for arthralgias and joint swelling (mild).  Skin: Positive for color change (Raynaud's). Negative for rash and wound.  Neurological: Negative for dizziness, syncope and light-headedness.       Objective:   Physical Exam  Vitals reviewed. Constitutional: She is oriented to person, place, and time.  Thin woman in NAD  HENT:  Head: Normocephalic and atraumatic.  Neck: Neck supple.  Cardiovascular: Normal rate, regular rhythm and normal heart sounds.   No murmur heard. Pulmonary/Chest: Effort normal and breath sounds normal. No respiratory distress. She has no wheezes.  Abdominal: Soft. Bowel sounds are normal. She exhibits no distension.  Musculoskeletal: She exhibits no edema and no tenderness.  Lymphadenopathy:    She has no cervical adenopathy.  Neurological: She is alert and oriented to person, place, and time.  Skin: Skin is warm and dry. No rash noted. No erythema.  No palmar erythema.  Dry skin  Psychiatric: She has a normal mood and affect. Her behavior is normal.    CBC, BMET today      Assessment & Plan:  RTC in 4 months, sooner if needed.

## 2013-07-22 NOTE — Patient Instructions (Signed)
Please schedule a follow up visit within the next 4-6 months.   For your medications:   Please bring all of your pill  Bottles with you to each visit.  This will help make sure that we have an up to date list of all the medications you are taking.  Please also bring any over the counter herbal medications you are taking (not including advil, tylenol, etc.)  Please start taking your medications more regularly.  It is important that you take your protonix, ranitidine, Boniva regularly, particularly if you start taking your prednisone more regularly.  You will also have prescriptions for pain medications.  Please do not take over the counter advil and diclofenac together as they can cause stomach bleeding.    Please call your rheumatologist about starting back on the plaquenil as that office should prescribe you this medication.  Please discuss with them if you should be weaned off of your chronic steroids.   We will help you set up a screening mammogram!  Please call with any questions.  Thank you!

## 2013-07-22 NOTE — Assessment & Plan Note (Signed)
Due for mammogram, ordered today. °

## 2013-07-22 NOTE — Assessment & Plan Note (Signed)
On Boniva intermittently.  I encouraged her to start taking more regularly.  She is interested in once yearly infusions, will look in to how to order.  It appears, however, that the boniva is not affecting her cough.

## 2013-07-22 NOTE — Assessment & Plan Note (Addendum)
At last check, H/H were stable.  She is not on iron supplementation, but is on acid reducing medication as she is on steroids.  Repeat CBC today, if continues to be anemic, will discuss with patient need for possible iron supplementation.    UPDATE: H/H stable, MCV low.  At next visit, check anemia panel, consider B12 and supplement.  Will need to discuss with patient at that time since CBC is stable and she is asymptomatic. She is up to date on colonoscopy (2014), normal colon.

## 2013-07-22 NOTE — Assessment & Plan Note (Signed)
Seems well controlled currently, despite being off medications.  She has no joint swelling and some pain that is controlled with medications.  She has increased stiffness/pain due to new job.  I encouraged her to keep upcoming appointment with rheumatology and getting started back on medications for her RA to avoid joint destruction.

## 2013-07-23 LAB — CBC WITH DIFFERENTIAL/PLATELET
BASOS ABS: 0.1 10*3/uL (ref 0.0–0.1)
Basophils Relative: 2 % — ABNORMAL HIGH (ref 0–1)
EOS ABS: 0 10*3/uL (ref 0.0–0.7)
EOS PCT: 1 % (ref 0–5)
HCT: 33.5 % — ABNORMAL LOW (ref 36.0–46.0)
Hemoglobin: 10.5 g/dL — ABNORMAL LOW (ref 12.0–15.0)
LYMPHS ABS: 0.9 10*3/uL (ref 0.7–4.0)
LYMPHS PCT: 28 % (ref 12–46)
MCH: 22.5 pg — AB (ref 26.0–34.0)
MCHC: 31.3 g/dL (ref 30.0–36.0)
MCV: 71.9 fL — AB (ref 78.0–100.0)
Monocytes Absolute: 0.4 10*3/uL (ref 0.1–1.0)
Monocytes Relative: 11 % (ref 3–12)
NEUTROS PCT: 58 % (ref 43–77)
Neutro Abs: 1.9 10*3/uL (ref 1.7–7.7)
PLATELETS: 222 10*3/uL (ref 150–400)
RBC: 4.66 MIL/uL (ref 3.87–5.11)
RDW: 19 % — AB (ref 11.5–15.5)
WBC: 3.3 10*3/uL — AB (ref 4.0–10.5)

## 2013-07-23 LAB — BASIC METABOLIC PANEL WITH GFR
BUN: 9 mg/dL (ref 6–23)
CALCIUM: 9.8 mg/dL (ref 8.4–10.5)
CO2: 26 mEq/L (ref 19–32)
CREATININE: 0.56 mg/dL (ref 0.50–1.10)
Chloride: 105 mEq/L (ref 96–112)
GFR, Est African American: >89 mL/min
GFR, Est Non African American: >89 mL/min
Glucose, Bld: 83 mg/dL (ref 70–99)
Potassium: 3.9 mEq/L (ref 3.5–5.3)
Sodium: 139 mEq/L (ref 135–145)

## 2013-08-06 ENCOUNTER — Encounter: Payer: Self-pay | Admitting: Internal Medicine

## 2013-08-06 ENCOUNTER — Telehealth: Payer: Self-pay | Admitting: *Deleted

## 2013-08-06 ENCOUNTER — Ambulatory Visit: Payer: BC Managed Care – PPO

## 2013-08-06 ENCOUNTER — Ambulatory Visit (INDEPENDENT_AMBULATORY_CARE_PROVIDER_SITE_OTHER): Payer: BC Managed Care – PPO | Admitting: Internal Medicine

## 2013-08-06 VITALS — BP 131/81 | HR 96 | Temp 96.2°F

## 2013-08-06 DIAGNOSIS — R059 Cough, unspecified: Secondary | ICD-10-CM

## 2013-08-06 DIAGNOSIS — M0510 Rheumatoid lung disease with rheumatoid arthritis of unspecified site: Secondary | ICD-10-CM

## 2013-08-06 DIAGNOSIS — J329 Chronic sinusitis, unspecified: Secondary | ICD-10-CM

## 2013-08-06 DIAGNOSIS — D539 Nutritional anemia, unspecified: Secondary | ICD-10-CM

## 2013-08-06 DIAGNOSIS — Z0000 Encounter for general adult medical examination without abnormal findings: Secondary | ICD-10-CM | POA: Insufficient documentation

## 2013-08-06 DIAGNOSIS — R053 Chronic cough: Secondary | ICD-10-CM

## 2013-08-06 DIAGNOSIS — Z23 Encounter for immunization: Secondary | ICD-10-CM

## 2013-08-06 DIAGNOSIS — R05 Cough: Secondary | ICD-10-CM

## 2013-08-06 DIAGNOSIS — K219 Gastro-esophageal reflux disease without esophagitis: Secondary | ICD-10-CM

## 2013-08-06 MED ORDER — CHLORPHENIRAMINE MALEATE 4 MG PO TABS: 4.0000 mg | ORAL_TABLET | Freq: Two times a day (BID) | ORAL | Status: DC | PRN

## 2013-08-06 MED ORDER — SALINE NASAL SPRAY 0.65 % NA SOLN: 1.0000 | NASAL | Status: DC | PRN

## 2013-08-06 MED ORDER — PANTOPRAZOLE SODIUM 40 MG PO TBEC: 40.0000 mg | DELAYED_RELEASE_TABLET | Freq: Every day | ORAL | Status: DC

## 2013-08-06 MED ORDER — GUAIFENESIN-CODEINE 100-10 MG/5ML PO SYRP: 5.0000 mL | ORAL_SOLUTION | Freq: Three times a day (TID) | ORAL | Status: DC | PRN

## 2013-08-06 MED ORDER — FLUTICASONE PROPIONATE 50 MCG/ACT NA SUSP: 2.0000 | Freq: Every day | NASAL | Status: DC

## 2013-08-06 NOTE — Assessment & Plan Note (Signed)
Will check anemia panel today.

## 2013-08-06 NOTE — Progress Notes (Signed)
   Subjective:    Patient ID: Jamie Cox, female    DOB: April 06, 1961, 53 y.o.   MRN: [id number]  HPI  Jamie Cox presents for evaluation of cough and chest pain.  She had a coughing spell last night and this morning that was associated with left chest discomfort simultaneously. She describes it as an "ache" that didn't radiate and which she only felt while she coughing or for seconds there after.States that she is now pain free.  No known cardiac history.  Hx is significant for rheumatoid arthritis with rheumatoid lung disease followed by pulmonology (Dr. Sherene Sires) and Rheumatology [facility]).  She also has GERD but has run out of her protonix and zantac.  Denies shortness of breath, fever or chills but she does take Tylenol regularly for her arthritis.  Review of Systems  Constitutional: Negative for fever and fatigue.  HENT: Positive for postnasal drip and rhinorrhea. Negative for nosebleeds, sinus pressure and sore throat.   Eyes: Negative for visual disturbance.  Respiratory: Positive for cough. Negative for shortness of breath, wheezing and stridor.   Cardiovascular: Positive for chest pain. Negative for palpitations and leg swelling.  Gastrointestinal: Negative.   Genitourinary: Negative.   Musculoskeletal: Negative.   Skin: Negative.   Neurological: Negative for weakness, numbness and headaches.  Psychiatric/Behavioral: Negative.        Objective:   Physical Exam  Constitutional: She is oriented to person, place, and time. She appears well-developed and well-nourished. No distress.  HENT:  Head: Normocephalic and atraumatic.  Eyes: Conjunctivae and EOM are normal. Pupils are equal, round, and reactive to light.  Neck: Normal range of motion. Neck supple. No thyromegaly present.  Cardiovascular: Normal rate, regular rhythm, normal heart sounds and intact distal pulses.   Pulmonary/Chest: Effort normal and breath sounds normal. No respiratory distress. She has no wheezes. She has no  rales. She exhibits no tenderness.  Abdominal: Soft. Bowel sounds are normal.  Musculoskeletal: She exhibits no edema.  Neurological: She is alert and oriented to person, place, and time.  Skin: Skin is warm and dry.  Psychiatric: She has a normal mood and affect.          Assessment & Plan:  See problem-list charting:

## 2013-08-06 NOTE — Assessment & Plan Note (Signed)
Doesn't appear to have acute process today.  Turbinates edematous with clear rhinorrea.  Pt not compliant with Flonase.

## 2013-08-06 NOTE — Assessment & Plan Note (Signed)
-   Will give flu shot today 

## 2013-08-06 NOTE — Patient Instructions (Signed)
We will refill your reflux, cough medicine, and nasal spray today. We will also prescribe an anti-histamine for a short period to help with your runny/stuffy nose. If your symptoms worsen, please call the clinic. Keep your appointment with the Rheumatologist. Follow-up with your PCP.

## 2013-08-06 NOTE — Assessment & Plan Note (Signed)
Pt ran out of Protonix and reporst some reflux symptoms thus GERD could also contribute to her cough. Refilled Protonix today.

## 2013-08-06 NOTE — Assessment & Plan Note (Signed)
Acute on chronic today likely secondary to increased rhinorrhea and upper-airway cough syndrome from increased post-nasal drip.  Will give short course of chlorpheniramine. Refilled Flonase and Robitussin.

## 2013-08-06 NOTE — Telephone Encounter (Signed)
Attempted to call her today as well regarding labwork, she needs to be re-initiated on iron therapy.  I am not sure if she tolerated it in the past, but she is anemic with low MCV.  Can consider repeating iron panel as well.    Thanks

## 2013-08-06 NOTE — Telephone Encounter (Signed)
Pt called with c/o nagging pain to left side of chest.  She has a cold, sneezing with cough.  She feels her sinuses are draining causing the cough. States chest pain for past few days, on and off, non radiating.  Pain worse at night and she has had this pain in past. Denies SOB, nausea or diaphoresis. Hx: pneumonia  Appointment given for 2:45 today, also instructed if pain increases or she gets SOB to go to ED for evaluation.

## 2013-08-07 LAB — ANEMIA PANEL
%SAT: 34 % (ref 20–55)
ABS Retic: 31.6 10*3/uL (ref 19.0–186.0)
Ferritin: 334 ng/mL — ABNORMAL HIGH (ref 10–291)
Folate: 17.4 ng/mL
IRON: 94 ug/dL (ref 42–145)
RBC.: 4.52 MIL/uL (ref 3.87–5.11)
Retic Ct Pct: 0.7 % (ref 0.4–2.3)
TIBC: 274 ug/dL (ref 250–470)
UIBC: 180 ug/dL (ref 125–400)
Vitamin B-12: 474 pg/mL (ref 211–911)

## 2013-08-07 NOTE — Addendum Note (Signed)
Addended by: Youlanda Roys A on: 08/07/2013 09:33 AM   Modules accepted: Orders

## 2013-08-09 NOTE — Progress Notes (Signed)
Case discussed with Dr. Schooler soon after the resident saw the patient.  We reviewed the resident's history and exam and pertinent patient test results.  I agree with the assessment, diagnosis, and plan of care documented in the resident's note. 

## 2013-08-30 ENCOUNTER — Ambulatory Visit: Payer: BC Managed Care – PPO

## 2013-09-20 ENCOUNTER — Encounter: Payer: Self-pay | Admitting: Internal Medicine

## 2013-09-20 ENCOUNTER — Ambulatory Visit (INDEPENDENT_AMBULATORY_CARE_PROVIDER_SITE_OTHER): Payer: BC Managed Care – PPO | Admitting: Internal Medicine

## 2013-09-20 ENCOUNTER — Other Ambulatory Visit: Payer: Self-pay | Admitting: Internal Medicine

## 2013-09-20 VITALS — BP 117/81 | HR 95 | Temp 98.2°F | Ht 61.0 in | Wt 95.0 lb

## 2013-09-20 DIAGNOSIS — R0789 Other chest pain: Secondary | ICD-10-CM

## 2013-09-20 DIAGNOSIS — M7918 Myalgia, other site: Secondary | ICD-10-CM | POA: Insufficient documentation

## 2013-09-20 DIAGNOSIS — S29011A Strain of muscle and tendon of front wall of thorax, initial encounter: Secondary | ICD-10-CM

## 2013-09-20 DIAGNOSIS — M069 Rheumatoid arthritis, unspecified: Secondary | ICD-10-CM

## 2013-09-20 MED ORDER — IBUPROFEN 400 MG PO TABS: 200.0000 mg | ORAL_TABLET | Freq: Three times a day (TID) | ORAL | Status: DC | PRN

## 2013-09-20 MED ORDER — HYDROCODONE-ACETAMINOPHEN 5-325 MG PO TABS: 1.0000 | ORAL_TABLET | Freq: Four times a day (QID) | ORAL | Status: DC | PRN

## 2013-09-20 NOTE — Assessment & Plan Note (Signed)
Patient symptoms and signs are consistent with Intercostal muscle pain. Discussed with the attending Dr. Cyndie Chime.  Plans Start Ibuprofen 400mg  q8h as needed for pain. Recommended to take Ibuprofen along with food. Follow up as needed.

## 2013-09-20 NOTE — Patient Instructions (Signed)
Take Ibuprofen 400 mg 1 tablet every 8 hours as needed for pain. Take Ibuprofen with food. Take all the other medications as advised below.

## 2013-09-20 NOTE — Progress Notes (Signed)
I have reviewed presenting complaint, physical findings, and medications with resident physician Dr Kennith Maes and I concur with management plan. Cephas Darby, MD, FACP  Hematology-Oncology/Internal Medicine

## 2013-09-20 NOTE — Progress Notes (Signed)
Subjective:   Patient ID: Jamie Cox female   DOB: Aug 24, 1960 53 y.o.   MRN: [id number]  HPI: [name] is a 53 y.o. woman with PMH significant for RA, osteoporosis Depression comes to the clinic with CC of right back pain.   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 Patient reports that the pain is located on the right back side, over the lower half of the rib cage. Patient reports that the pain is exacerbated by deep inspiration and is relieved by sleeping on the right side. Patient denies any fever, chills, body pains, SOB, cough, runny nose. She reports taking Tylenol tablets for pain and using Icy Hot without any relief.  She denies any other complaints. She is also requesting for a Vicodin refill.   Past Medical History  Diagnosis Date  . Rheumatoid arthritis(714.0)   . Personal history of other diseases of circulatory system   . Osteoporosis, unspecified     steroid induced  . Unspecified deficiency anemia     microcytic  . Chronic sinusitis     Followed by Dr. Jenne Pane  . Environmental allergies   . GERD (gastroesophageal reflux disease)    Current Outpatient Prescriptions  Medication Sig Dispense Refill  . acetaminophen (TYLENOL) 650 MG CR tablet Take 650 mg by mouth every 8 (eight) hours as needed. For pain      . albuterol (PROVENTIL HFA;VENTOLIN HFA) 108 (90 BASE) MCG/ACT inhaler Inhale 2 puffs into the lungs every 6 (six) hours as needed. For shortness of breath/wheezing      . calcium-vitamin D (CALCIUM 500/D) 500-200 MG-UNIT per tablet 1 tablet.      . chlorpheniramine (CHLOR-TRIMETON) 4 MG tablet Take 1 tablet (4 mg total) by mouth 2 (two) times daily as needed for rhinitis.  14 tablet  0  . diclofenac (VOLTAREN) 75 MG EC tablet Take 1 tablet (75 mg total) by mouth 2 (two) times daily as needed for mild pain or moderate pain.  60 tablet  2  . fluticasone (FLONASE) 50 MCG/ACT nasal spray  Place 2 sprays into both nostrils daily.  16 g  3  . folic acid (FOLVITE) 1 MG tablet Take 1 mg by mouth daily.        Marland Kitchen guaiFENesin-codeine (ROBITUSSIN AC) 100-10 MG/5ML syrup Take 5 mLs by mouth 3 (three) times daily as needed for cough.  120 mL  0  . HYDROcodone-acetaminophen (NORCO/VICODIN) 5-325 MG per tablet Take 1 tablet by mouth every 6 (six) hours as needed.  30 tablet  0  . hydroxychloroquine (PLAQUENIL) 200 MG tablet Take 1 tablet by mouth 2 (two) times daily.      Marland Kitchen ibandronate (BONIVA) 150 MG tablet Take 1 tablet (150 mg total) by mouth every 30 (thirty) days. Take in the AM with a full glass of water, on empty stomach, and don't lie down for the next 30 min or take anything else by mouth.  1 tablet  2        . loratadine (CLARITIN) 10 MG tablet 10 mg.      . Multiple Vitamin (MULTIVITAMIN) capsule Take 1 capsule by mouth daily.        . pantoprazole (PROTONIX) 40 MG tablet Take 1 tablet (40 mg total) by mouth daily.  30 tablet  11  . predniSONE (DELTASONE) 2.5 MG tablet Take 1 tablet (2.5 mg total) by mouth daily with breakfast.  30 tablet  2  . ranitidine (ZANTAC) 300 MG capsule 300 mg.      . sodium chloride (OCEAN NASAL SPRAY) 0.65 % nasal spray Place 1 spray into the nose as needed for congestion.  45 mL  3   No current facility-administered medications for this visit.   Family History  Problem Relation Age of Onset  . Liver cancer Maternal Grandmother   . Hypertension Maternal Grandmother   . Scleroderma Mother   . Prostate cancer Father   . Lung cancer Paternal Uncle   . Coronary artery disease Paternal Grandmother   . Hypertension Paternal Grandmother   . Colon cancer Neg Hx    History   Social History  . Marital Status: Married    Spouse Name: N/A    Number of Children: 2  . Years of Education: N/A   Occupational History  . Data processing manager    Social History Main Topics  . Smoking status: Never Smoker   . Smokeless tobacco: Never Used  . Alcohol Use: No   . Drug Use: No  . Sexual Activity: Not Currently   Other Topics Concern  . None   Social History Narrative  . None   Review of Systems: Pertinent items are noted in HPI. Objective:  Physical Exam: Filed Vitals:   09/20/13 1541  BP: 117/81  Pulse: 95  Temp: 98.2 F (36.8 C)  TempSrc: Oral  Height: 5\' 1"  (1.549 m)  Weight: 95 lb (43.092 kg)  SpO2: 100%   Constitutional: Vital signs reviewed.   Patient is a well-developed and well-nourished and is in no acute distress and cooperative with exam.  Neck: Supple, Trachea midline normal ROM. Cardiovascular: RRR, S1 normal, S2 normal, no MRG, pulses symmetric and intact bilaterally Pulmonary/Chest: Mild tenderness to deep palpation over the posterior aspect of the rib cage along the 9 th or 10 th intercostal muscle adjacent to the midline. Normal respiratory effort, CTAB, no wheezes, rales, or rhonchi. No muscle knots noticed over the back. Neurological: A&O x3. Non-focal neuro exam. Psychiatric: Normal mood and affect. speech and behavior is normal. Judgment and thought content normal. Cognition and memory are normal.    Assessment & Plan:

## 2013-09-20 NOTE — Assessment & Plan Note (Signed)
Patient requesting Vicodin refill. Refill #30

## 2013-09-25 DIAGNOSIS — Z7952 Long term (current) use of systemic steroids: Secondary | ICD-10-CM | POA: Insufficient documentation

## 2013-12-30 ENCOUNTER — Other Ambulatory Visit: Payer: Self-pay | Admitting: Internal Medicine

## 2014-05-07 ENCOUNTER — Other Ambulatory Visit: Payer: Self-pay | Admitting: Internal Medicine

## 2014-06-10 ENCOUNTER — Other Ambulatory Visit: Payer: Self-pay | Admitting: *Deleted

## 2014-06-10 DIAGNOSIS — M069 Rheumatoid arthritis, unspecified: Secondary | ICD-10-CM

## 2014-06-13 MED ORDER — HYDROCODONE-ACETAMINOPHEN 5-325 MG PO TABS
1.0000 | ORAL_TABLET | Freq: Four times a day (QID) | ORAL | Status: DC | PRN
Start: 2014-06-13 — End: 2014-08-08

## 2014-06-14 ENCOUNTER — Other Ambulatory Visit: Payer: Self-pay | Admitting: Pulmonary Disease

## 2014-06-14 DIAGNOSIS — M810 Age-related osteoporosis without current pathological fracture: Secondary | ICD-10-CM

## 2014-06-14 MED ORDER — IBANDRONATE SODIUM 150 MG PO TABS: ORAL_TABLET | ORAL | Status: DC

## 2014-08-07 ENCOUNTER — Telehealth: Payer: Self-pay | Admitting: *Deleted

## 2014-08-07 NOTE — Telephone Encounter (Signed)
Agree with offering appointment today.  As patient prefers appointment tomorrow I agree with recommendations to show up at ED should there be any acute changes/worsening of the chest pain.

## 2014-08-07 NOTE — Telephone Encounter (Signed)
Pt called with c/o slight chest pain for past 4 - 5 days.  It's on and off and feels tired.  Pain is mid sternal, rates pain 4/10.  Denies SOB, nausea, diaphoresis or dizziness.  Has had similar pain and was told it was pleurisy.  She has a productive cough. No fever No cardiac history.  Hx: pleurisy   I offered an appointment to today but she is unable to come in.  I gave her an appointment for tomorrow.  AM but told her if the pain returns and she has other symptoms she needs to be seen in the ED.  She voices understanding.

## 2014-08-08 ENCOUNTER — Ambulatory Visit (INDEPENDENT_AMBULATORY_CARE_PROVIDER_SITE_OTHER): Payer: BC Managed Care – PPO | Admitting: Internal Medicine

## 2014-08-08 ENCOUNTER — Encounter: Payer: Self-pay | Admitting: Internal Medicine

## 2014-08-08 ENCOUNTER — Ambulatory Visit (HOSPITAL_COMMUNITY)
Admission: RE | Admit: 2014-08-08 | Discharge: 2014-08-08 | Disposition: A | Discharge status: Home / Self Care | Payer: BC Managed Care – PPO | Source: Ambulatory Visit | Attending: Internal Medicine | Admitting: Internal Medicine

## 2014-08-08 VITALS — BP 124/85 | HR 79 | Temp 97.9°F | Ht 61.0 in | Wt 97.6 lb

## 2014-08-08 DIAGNOSIS — R05 Cough: Secondary | ICD-10-CM | POA: Diagnosis not present

## 2014-08-08 DIAGNOSIS — R053 Chronic cough: Secondary | ICD-10-CM

## 2014-08-08 DIAGNOSIS — M069 Rheumatoid arthritis, unspecified: Secondary | ICD-10-CM | POA: Diagnosis not present

## 2014-08-08 DIAGNOSIS — Z0000 Encounter for general adult medical examination without abnormal findings: Secondary | ICD-10-CM

## 2014-08-08 MED ORDER — ALBUTEROL SULFATE HFA 108 (90 BASE) MCG/ACT IN AERS: 1.0000 | INHALATION_SPRAY | Freq: Four times a day (QID) | RESPIRATORY_TRACT | Status: DC | PRN

## 2014-08-08 MED ORDER — FLUTICASONE PROPIONATE 50 MCG/ACT NA SUSP: 2.0000 | Freq: Every day | NASAL | Status: DC

## 2014-08-08 MED ORDER — HYDROCODONE-ACETAMINOPHEN 5-325 MG PO TABS: 1.0000 | ORAL_TABLET | Freq: Four times a day (QID) | ORAL | Status: DC | PRN

## 2014-08-08 NOTE — Assessment & Plan Note (Addendum)
Afebrile but has hx of PNA with no fever. No increased cough but now with chest discomfort.  -Refilled aluterol inh. Pt to continue taking Robitussin, flonase, claritin.  -Ordered chest Xrays -Ordered PFTs -May need to follow up with Pulmonology if cough does not improve  Addendum: CXR with no acute disease. The pt was notified (left a message with her daughter)

## 2014-08-08 NOTE — Patient Instructions (Signed)
General Instructions: -It was a pleasure seeing you today! -Please go to Radiology to have your chest Xray done. I will call you with the results once the Xray is reviewed by the Radiologist.  -Continue taking Protonix and claritin and using albuterol inhaler as needed.  -You need a Pulmonary Function Test and this has been ordered for you. They will call you with the appointment date and time for this test.  -You also need a mammogram, make sure to call to make an appointment for this test.  -Follow up with [place] in 6 months or sooner as needed.    Please bring your medicines with you each time you come to clinic.  Medicines may include prescription medications, over-the-counter medications, herbal remedies, eye drops, vitamins, or other pills.

## 2014-08-08 NOTE — Assessment & Plan Note (Signed)
She is due for a mammogram--last done on Sept 13 with findings likely due to summation artifact over the upper outer left breast. She denies breast changes but agrees to call to schedule mammogram.  -She is also due for flu vaccine, Pap smear, HIV screening--all deferred at this time.

## 2014-08-08 NOTE — Assessment & Plan Note (Addendum)
Stable on prednisone 2.5mg  daily. She has appointment with her Rheumatologist on 3/31 and will discuss her Boniva and possible restart of Plaquenil.  Refilled her Rx of Vicodin 5-325mg  1 tablet q6hr PRN #30 per her medication contract.

## 2014-08-08 NOTE — Progress Notes (Signed)
   Subjective:    Patient ID: Jamie Cox, female    DOB: November 15, 1960, 54 y.o.   MRN: [id number]  HPI Jamie Cox is a 53 yr old woman with PMH of RA, osteoporosis, depression, who comes in for evaluation of a cough that has worsened in the past 2-3 weeks and now is accompanied by occasional chest tightness. She reports having this cough for years. Has seen a Pulmonologist and ENT was told to use a PPI and allergy medications which she has been using. She is concerned she may have PNA as she takes Tylenol everyday for RA and understands that this medicine would mast a fever. Denies confusion, increased SOB, chills, decreased intake per mouth. She has hx of PNA in the past with no fever. 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  She has not seen her Rheumatologist for months, is currently on prednisone 2.5mg  daily but has been off Plaquenil. She has a follow up appointment with her Rheumatologist on 3/31.    Review of Systems  Constitutional: Negative for fever, chills, diaphoresis, activity change, appetite change, fatigue and unexpected weight change.  HENT: Positive for postnasal drip. Negative for congestion, rhinorrhea, sinus pressure, sore throat and voice change.   Respiratory: Positive for cough and shortness of breath. Negative for stridor.   Cardiovascular: Positive for chest pain. Negative for palpitations and leg swelling.  Gastrointestinal: Negative for abdominal pain and diarrhea.  Genitourinary: Negative for dysuria and difficulty urinating.  Musculoskeletal: Positive for arthralgias.  Neurological: Negative for dizziness and light-headedness.  Psychiatric/Behavioral: Negative for confusion and agitation.       Objective:   Physical Exam  Constitutional: She is oriented to person, place, and time. She appears well-developed and well-nourished. No distress.  HENT:  Head: Normocephalic.  Cardiovascular: Normal rate.   Pulmonary/Chest: Effort normal. No  respiratory distress. She has no wheezes. She has no rales. She exhibits tenderness.  Mild TTP over mid sternal area  Abdominal: Soft. Bowel sounds are normal. There is no tenderness.  Musculoskeletal: She exhibits no edema.  Neurological: She is alert and oriented to person, place, and time. Coordination normal.  Skin: Skin is warm and dry. No rash noted. She is not diaphoretic.  Psychiatric: She has a normal mood and affect.  Nursing note and vitals reviewed.         Assessment & Plan:

## 2014-08-12 NOTE — Progress Notes (Signed)
Internal Medicine Clinic Attending  Case discussed with Dr. Kennerly soon after the resident saw the patient.  We reviewed the resident's history and exam and pertinent patient test results.  I agree with the assessment, diagnosis, and plan of care documented in the resident's note.  

## 2014-08-14 ENCOUNTER — Encounter (HOSPITAL_COMMUNITY): Payer: BC Managed Care – PPO

## 2014-08-14 ENCOUNTER — Encounter: Payer: Self-pay | Admitting: *Deleted

## 2014-08-18 ENCOUNTER — Encounter (HOSPITAL_COMMUNITY): Payer: BC Managed Care – PPO

## 2014-09-05 ENCOUNTER — Ambulatory Visit (HOSPITAL_COMMUNITY)
Admission: RE | Admit: 2014-09-05 | Discharge: 2014-09-05 | Disposition: A | Discharge status: Home / Self Care | Payer: BC Managed Care – PPO | Source: Ambulatory Visit | Attending: Internal Medicine | Admitting: Internal Medicine

## 2014-09-05 DIAGNOSIS — R053 Chronic cough: Secondary | ICD-10-CM

## 2014-09-05 DIAGNOSIS — R05 Cough: Secondary | ICD-10-CM | POA: Diagnosis not present

## 2014-09-05 DIAGNOSIS — R0609 Other forms of dyspnea: Secondary | ICD-10-CM | POA: Diagnosis not present

## 2014-09-05 LAB — PULMONARY FUNCTION TEST
DL/VA % PRED: 91 %
DL/VA: 3.87 ml/min/mmHg/L
DLCO unc % pred: 66 %
DLCO unc: 12.63 ml/min/mmHg
FEF 25-75 Post: 1.27 L/sec
FEF 25-75 Pre: 1.18 L/sec
FEF2575-%Change-Post: 7 %
FEF2575-%Pred-Post: 65 %
FEF2575-%Pred-Pre: 60 %
FEV1-%Change-Post: 0 %
FEV1-%Pred-Post: 93 %
FEV1-%Pred-Pre: 94 %
FEV1-Post: 1.73 L
FEV1-Pre: 1.73 L
FEV1FVC-%Change-Post: 0 %
FEV1FVC-%Pred-Pre: 90 %
FEV6-%Change-Post: 0 %
FEV6-%PRED-POST: 105 %
FEV6-%PRED-PRE: 105 %
FEV6-Post: 2.35 L
FEV6-Pre: 2.36 L
FEV6FVC-%CHANGE-POST: 0 %
FEV6FVC-%PRED-POST: 102 %
FEV6FVC-%PRED-PRE: 102 %
FVC-%Change-Post: 0 %
FVC-%PRED-PRE: 102 %
FVC-%Pred-Post: 102 %
FVC-POST: 2.38 L
FVC-Pre: 2.39 L
Post FEV1/FVC ratio: 72 %
Post FEV6/FVC ratio: 99 %
Pre FEV1/FVC ratio: 72 %
Pre FEV6/FVC Ratio: 99 %
RV % pred: 103 %
RV: 1.77 L
TLC % pred: 90 %
TLC: 4.03 L

## 2014-09-05 MED ORDER — ALBUTEROL SULFATE (2.5 MG/3ML) 0.083% IN NEBU
2.5000 mg | INHALATION_SOLUTION | Freq: Once | RESPIRATORY_TRACT | Status: AC
  Administered 2014-09-05: 2.5 mg via RESPIRATORY_TRACT

## 2014-11-09 ENCOUNTER — Other Ambulatory Visit: Payer: Self-pay | Admitting: Internal Medicine

## 2014-12-19 ENCOUNTER — Ambulatory Visit (HOSPITAL_COMMUNITY)
Admission: RE | Admit: 2014-12-19 | Discharge: 2014-12-19 | Disposition: A | Discharge status: Home / Self Care | Payer: BC Managed Care – PPO | Source: Ambulatory Visit | Attending: Physician Assistant | Admitting: Physician Assistant

## 2014-12-19 ENCOUNTER — Encounter: Payer: Self-pay | Admitting: Internal Medicine

## 2014-12-19 ENCOUNTER — Ambulatory Visit (INDEPENDENT_AMBULATORY_CARE_PROVIDER_SITE_OTHER): Payer: BC Managed Care – PPO | Admitting: Internal Medicine

## 2014-12-19 VITALS — BP 129/75 | HR 84 | Temp 98.0°F | Ht 61.0 in | Wt 96.2 lb

## 2014-12-19 DIAGNOSIS — M069 Rheumatoid arthritis, unspecified: Secondary | ICD-10-CM

## 2014-12-19 DIAGNOSIS — R9431 Abnormal electrocardiogram [ECG] [EKG]: Secondary | ICD-10-CM | POA: Diagnosis not present

## 2014-12-19 DIAGNOSIS — R0789 Other chest pain: Secondary | ICD-10-CM | POA: Insufficient documentation

## 2014-12-19 DIAGNOSIS — R0782 Intercostal pain: Secondary | ICD-10-CM | POA: Diagnosis not present

## 2014-12-19 DIAGNOSIS — M810 Age-related osteoporosis without current pathological fracture: Secondary | ICD-10-CM

## 2014-12-19 DIAGNOSIS — M7918 Myalgia, other site: Secondary | ICD-10-CM

## 2014-12-19 MED ORDER — HYDROCODONE-ACETAMINOPHEN 5-325 MG PO TABS: 1.0000 | ORAL_TABLET | Freq: Four times a day (QID) | ORAL | Status: DC | PRN

## 2014-12-19 MED ORDER — MELOXICAM 7.5 MG PO TABS: 7.5000 mg | ORAL_TABLET | Freq: Every day | ORAL | Status: DC | PRN

## 2014-12-19 MED ORDER — IBANDRONATE SODIUM 150 MG PO TABS: ORAL_TABLET | ORAL | Status: DC

## 2014-12-19 NOTE — Patient Instructions (Signed)
1. You should start to take the Plaquenil that was prescribed by the rheumatologist for your rheumatoid arthritis.  This med is meant to be a longer term treatment and hopefully you will need less of the Tylenol, Meloxicam and Vicodin if you take the Plaquenil.  Please try and decrease your tylenol use to no more than one 650mg  Tylenol every 12 hours.  I am prescribing meloxicam 7.5mg  for you to use daily if needed.  If you are still having pain you can take a second meloxicam but DO NOT take more than 2 pills in one day.  Meloxicam works like ibuprofen, naproxen sodium and diclofenac so do not use these medications while you are taking meloxicam.  Take meloxicam with food and be sure to stay hydrated throughout the day.  I am refilling your Vicodin to use if you are still having pain.  Remember that Vicodin has Tylenol in it so make sure you do not take an extra Tylenol on days you need to take Vicodin.     2. Please take all medications as prescribed.    3. If you have worsening of your symptoms or new symptoms arise, please call the clinic ), or go to the ER immediately if symptoms are severe.   Be sure to see your rheumatologist next month to discuss symptoms and alternative therapies.  Be sure to see your eye doctor as soon as possible since you will be back on Plaquenil.  Please come back to see you primary doctor, Dr. ([phone], next month after you have seen rheumatology.  Come back sooner if you are still having symptoms and cannot get in to rheumatology.

## 2014-12-19 NOTE — Progress Notes (Signed)
Subjective:    Patient ID: Jamie Cox, female    DOB: 03-19-1961, 54 y.o.   MRN: [id number]  HPI Comments: Ms. Gangwer is a 54 year old with PMH as below here for follow-up.  Please see problem based charting for assessment and plan.     Past Medical History  Diagnosis Date  . Rheumatoid arthritis(714.0)   . Personal history of other diseases of circulatory system   . Osteoporosis, unspecified     steroid induced  . Unspecified deficiency anemia     microcytic  . Chronic sinusitis     Followed by Dr. Jenne Pane  . Environmental allergies   . GERD (gastroesophageal reflux disease)    Current Outpatient Prescriptions on File Prior to Visit  Medication Sig Dispense Refill  . acetaminophen (TYLENOL) 650 MG CR tablet Take 650 mg by mouth every 8 (eight) hours as needed. For pain    . albuterol (PROVENTIL HFA;VENTOLIN HFA) 108 (90 BASE) MCG/ACT inhaler Inhale 1-2 puffs into the lungs every 6 (six) hours as needed for wheezing or shortness of breath (cough). For shortness of breath/wheezing 3.7 g 1  . calcium-vitamin D (CALCIUM 500/D) 500-200 MG-UNIT per tablet 1 tablet.    . diclofenac (VOLTAREN) 75 MG EC tablet TAKE ONE TABLET BY MOUTH TWICE DAILY AS NEEDED FOR MILD PAIN OR MODERATE PAIN 60 tablet 2  . fluticasone (FLONASE) 50 MCG/ACT nasal spray Place 2 sprays into both nostrils daily. 16 g 3  . folic acid (FOLVITE) 1 MG tablet Take 1 mg by mouth daily.      Marland Kitchen guaiFENesin-codeine (ROBITUSSIN AC) 100-10 MG/5ML syrup Take 5 mLs by mouth 3 (three) times daily as needed for cough. 120 mL 0  . HYDROcodone-acetaminophen (NORCO/VICODIN) 5-325 MG per tablet Take 1 tablet by mouth every 6 (six) hours as needed. 30 tablet 0  . ibandronate (BONIVA) 150 MG tablet Take in AM with full glass of water, on empty stomach. Don't lie down for 30 min or take anythingby mouth. 1 tablet 2  . ibuprofen (ADVIL) 400 MG tablet Take 0.5 tablets (200 mg total) by mouth every 8 (eight) hours as needed. 30 tablet 0  .  loratadine (CLARITIN) 10 MG tablet 10 mg.    . Multiple Vitamin (MULTIVITAMIN) capsule Take 1 capsule by mouth daily.      . pantoprazole (PROTONIX) 40 MG tablet Take 1 tablet (40 mg total) by mouth daily. 30 tablet 11  . predniSONE (DELTASONE) 2.5 MG tablet TAKE ONE TABLET BY MOUTH ONCE DAILY WITH BREAKFAST 30 tablet 0  . ranitidine (ZANTAC) 300 MG tablet TAKE ONE-HALF TABLET BY MOUTH AT BEDTIME 30 tablet 0  . sodium chloride (OCEAN NASAL SPRAY) 0.65 % nasal spray Place 1 spray into the nose as needed for congestion. 45 mL 3   No current facility-administered medications on file prior to visit.    Review of Systems  Constitutional: Positive for fatigue. Negative for fever, chills and appetite change.  Respiratory: Negative for shortness of breath.   Cardiovascular: Positive for chest pain.       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  Not sharp but she can't describe quality.  Musculoskeletal: Positive for arthralgias. Negative for joint swelling.       No AM stiffness.   Neurological: Positive for light-headedness. Negative for syncope.       When she is moving around or at work.  She stops and it resolves.         Filed Vitals:  12/19/14 0920  BP: 129/75  Pulse: 84  Temp: 98 F (36.7 C)  TempSrc: Oral  Height: 5\' 1"  (1.549 m)  Weight: 96 lb 3.2 oz (43.636 kg)  SpO2: 100%     Objective:   Physical Exam  Constitutional: She is oriented to person, place, and time. She appears well-developed. No distress.  HENT:  Head: Normocephalic and atraumatic.  Mouth/Throat: Oropharynx is clear and moist. No oropharyngeal exudate.  Eyes: EOM are normal. Pupils are equal, round, and reactive to light.  Neck: Neck supple.  Cardiovascular: Normal rate, regular rhythm and normal heart sounds.  Exam reveals no gallop and no friction rub.   No murmur heard. Pulmonary/Chest: Effort normal and breath sounds normal. No respiratory distress. She has no  wheezes. She has no rales. She exhibits no tenderness.  Abdominal: Soft. Bowel sounds are normal. She exhibits no distension and no mass. There is no tenderness. There is no rebound and no guarding.  Musculoskeletal: Normal range of motion. She exhibits no edema or tenderness.  Small joints of the hands, B/L wrists, elbows, shoulders, knees and ankles palpated and are non-tender, not swollen. Spine is non-tender.  Neurological: She is alert and oriented to person, place, and time. No cranial nerve deficit.  Skin: Skin is warm. She is not diaphoretic.  Psychiatric: She has a normal mood and affect. Her behavior is normal. Judgment and thought content normal.  Vitals reviewed.         Assessment & Plan:  Please see problem based charting for assessment and plan.

## 2014-12-19 NOTE — Assessment & Plan Note (Addendum)
Her chest pain lasting seconds and resolving spontaneously does not sound typical of ACS. She is at increased risk for CAD given RA hx, but otherwise is a never smoker without other risk factors.  EKG is NSR.  She has known rheumatoid lung ds but no dyspnea, lung exam unremarkable and CP would be an unusual symptom.  Her CP is likely MSK in the setting of RA.   - recommend Plaquenil, Mobic prn - follow-up with Rheum - advised to call [phone] if she develops severe CP, CP that radiates into arm/back - follow-up with PCP next month

## 2014-12-19 NOTE — Assessment & Plan Note (Addendum)
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  Feels like her usual rheum pain.  Last saw rheum on 09/04/14 and Plaquenil was added back to her regimen (she had been on it in the past).  She stopped Plaquenil after a few weeks due to concern for ADRs.  Also takes pred 2.5mg  daily, 650mg  Tylenol x2-6 pills daily x years, diclofenac4/7 days out the week usually before and after work, ibuprofen or other NSAID when she runs out of diclofenac.   - advised to resume Plaquenil 200mg  daily (discussed cost vs benefit) - stop diclofenac and try Mobic 7.5mg  daily prn (she can take two Mobic if she has to); she will take with food - refilled Vicodin 5/325 #30 - she rarely uses except for breakthrough pain (check controlled substance database and she has not filled rx since last prescribed by her PCP in 08/2014 so she is using sparingly) - advised to decrease Tylenol and she is aware that Vicodin has Tylenol in it - advised to follow-up with Rheumatologist next month to discuss treatment options - advised to see eye doctor as soon as possible since she will be resuming Plaquenil and has not been seen in awhile (requires yearly monitoring on Plaquenil)

## 2014-12-22 NOTE — Progress Notes (Signed)
[facility] Medicine Clinic Attending  Case discussed with Dr. Wilson soon after the resident saw the patient.  We reviewed the resident's history and exam and pertinent patient test results.  I agree with the assessment, diagnosis, and plan of care documented in the resident's note.  

## 2015-01-18 ENCOUNTER — Encounter (HOSPITAL_COMMUNITY): Payer: Self-pay | Admitting: Emergency Medicine

## 2015-01-18 ENCOUNTER — Emergency Department (HOSPITAL_COMMUNITY)
Admission: EM | Admit: 2015-01-18 | Discharge: 2015-01-18 | Disposition: A | Discharge status: Home / Self Care | Payer: BC Managed Care – PPO | Source: Home / Self Care | Attending: Emergency Medicine | Admitting: Emergency Medicine

## 2015-01-18 DIAGNOSIS — H6591 Unspecified nonsuppurative otitis media, right ear: Secondary | ICD-10-CM | POA: Diagnosis not present

## 2015-01-18 DIAGNOSIS — J329 Chronic sinusitis, unspecified: Secondary | ICD-10-CM

## 2015-01-18 MED ORDER — AZITHROMYCIN 250 MG PO TABS: ORAL_TABLET | ORAL | Status: DC

## 2015-01-18 MED ORDER — CARBAMIDE PEROXIDE 6.5 % OT SOLN: 5.0000 [drp] | Freq: Two times a day (BID) | OTIC | Status: DC

## 2015-01-18 NOTE — ED Provider Notes (Signed)
CSN: [id number]     Arrival date & time 01/18/15  1336 History   First MD Initiated Contact with Patient 01/18/15 1412     Chief Complaint  Patient presents with  . URI   (Consider location/radiation/quality/duration/timing/severity/associated sxs/prior Treatment) HPI  She is a 54 year old woman here for evaluation of right ear ache. She states for the last 2-3 weeks she has had intermittent right ear pain. She also reports some itching and decreased hearing out of the right ear. She has used a Q-tip and removed some wax. She reports chronic sinus drainage, but reports worsening sore throat and cough over the last week. No shortness of breath. No fevers, but she does take Tylenol daily for arthritis pain.  Past Medical History  Diagnosis Date  . Rheumatoid arthritis(714.0)   . Personal history of other diseases of circulatory system   . Osteoporosis, unspecified     steroid induced  . Unspecified deficiency anemia     microcytic  . Chronic sinusitis     Followed by Dr. Jenne Pane  . Environmental allergies   . GERD (gastroesophageal reflux disease)    Past Surgical History  Procedure Laterality Date  . Nasal sinus surgery  06/2012   Family History  Problem Relation Age of Onset  . Liver cancer Maternal Grandmother   . Hypertension Maternal Grandmother   . Scleroderma Mother   . Prostate cancer Father   . Lung cancer Paternal Uncle   . Coronary artery disease Paternal Grandmother   . Hypertension Paternal Grandmother   . Colon cancer Neg Hx    Social History  Substance Use Topics  . Smoking status: Never Smoker   . Smokeless tobacco: Never Used  . Alcohol Use: No   OB History    No data available     Review of Systems As in history of present illness Allergies  Avelox and Bactrim  Home Medications   Prior to Admission medications   Medication Sig Start Date End Date Taking? Authorizing Provider  acetaminophen (TYLENOL) 650 MG CR tablet Take 650 mg by mouth every 8  (eight) hours as needed. For pain   Yes Historical Provider, MD  albuterol (PROVENTIL HFA;VENTOLIN HFA) 108 (90 BASE) MCG/ACT inhaler Inhale 1-2 puffs into the lungs every 6 (six) hours as needed for wheezing or shortness of breath (cough). For shortness of breath/wheezing 08/08/14  Yes Ky Barban, MD  calcium-vitamin D (CALCIUM 500/D) 500-200 MG-UNIT per tablet 1 tablet.   Yes Historical Provider, MD  fluticasone (FLONASE) 50 MCG/ACT nasal spray Place 2 sprays into both nostrils daily. 08/08/14  Yes Ky Barban, MD  hydroxychloroquine (PLAQUENIL) 200 MG tablet Take 200 mg by mouth. 09/03/14 09/03/15 Yes Historical Provider, MD  ibandronate (BONIVA) 150 MG tablet Take one tablet every 30 days.  Take in AM with full glass of water on empty stomach. Don't lie down for 30 min or take anything by mouth. 12/19/14  Yes Alex Elson Clan, DO  loratadine (CLARITIN) 10 MG tablet 10 mg.   Yes Historical Provider, MD  meloxicam (MOBIC) 7.5 MG tablet Take 1 tablet (7.5 mg total) by mouth daily as needed for pain. Take a second tablet if needed.  Do not take more than 2 tablets in 1 day. 12/19/14  Yes Yolanda Manges, DO  Multiple Vitamin (MULTIVITAMIN) capsule Take 1 capsule by mouth daily.     Yes Historical Provider, MD  pantoprazole (PROTONIX) 40 MG tablet Take 1 tablet (40 mg total) by mouth daily. 08/06/13  Yes Kristie Cowman, MD  ranitidine (ZANTAC) 300 MG tablet TAKE ONE-HALF TABLET BY MOUTH AT BEDTIME 11/11/14  Yes Lora Paula, MD  azithromycin (ZITHROMAX Z-PAK) 250 MG tablet Take 2 pills today, then 1 pill daily until gone. 01/18/15   Charm Rings, MD  carbamide peroxide (DEBROX) 6.5 % otic solution Place 5 drops into the left ear 2 (two) times daily. 01/18/15   Charm Rings, MD  HYDROcodone-acetaminophen (NORCO/VICODIN) 5-325 MG per tablet Take 1 tablet by mouth every 6 (six) hours as needed for moderate pain. 12/19/14   Yolanda Manges, DO  predniSONE (DELTASONE) 2.5 MG tablet TAKE ONE TABLET BY MOUTH  ONCE DAILY WITH BREAKFAST 11/11/14   Lora Paula, MD  sodium chloride (OCEAN NASAL SPRAY) 0.65 % nasal spray Place 1 spray into the nose as needed for congestion. 08/06/13 08/06/14  Kristie Cowman, MD   BP 131/86 mmHg  Pulse 86  Temp(Src) 98.3 F (36.8 C) (Oral)  Resp 18  SpO2 100% Physical Exam  Constitutional: She is oriented to person, place, and time. She appears well-developed and well-nourished. No distress.  HENT:  Mouth/Throat: No oropharyngeal exudate.  Clear postnasal drainage. Bilateral TMs obscured by earwax.  Eyes: Conjunctivae are normal.  Neck: Neck supple.  Cardiovascular: Normal rate, regular rhythm and normal heart sounds.   No murmur heard. Pulmonary/Chest: Effort normal and breath sounds normal. No respiratory distress. She has no wheezes. She has no rales.  Lymphadenopathy:    She has no cervical adenopathy.  Neurological: She is alert and oriented to person, place, and time.    ED Course  Procedures (including critical care time) Labs Review Labs Reviewed - No data to display  Imaging Review No results found.   MDM   1. Middle ear effusion, right   2. Other sinusitis    Right ear shows a serous middle ear effusion. Left earwax removal was unsuccessful.  Patient to resume her sinus rinses. If no improvement in 2-3 days, she will fill the prescription for azithromycin. Recommended debrox for left cerumen impaction.    Charm Rings, MD 01/18/15 [id number]

## 2015-01-18 NOTE — ED Notes (Signed)
C/o cold sx onset 1 week Sx include cough, congestion Also reports bilateral ear pain onset 2 weeks  Alert... No acute distress.

## 2015-01-18 NOTE — Discharge Instructions (Signed)
I think your symptoms are coming from your sinuses. Please restart your sinus rinses 2-3 times a day. If you are not improving in the next 2-3 days, please fill the prescription for azithromycin.  Use the Debrox eardrops (available over the counter) twice a day for the next 7 days in the left ear to remove the wax. Follow-up as needed.

## 2015-04-28 ENCOUNTER — Encounter: Payer: Self-pay | Admitting: Student

## 2015-07-10 ENCOUNTER — Encounter: Payer: Self-pay | Admitting: Behavioral Health

## 2015-07-10 ENCOUNTER — Telehealth: Payer: Self-pay | Admitting: Behavioral Health

## 2015-07-10 NOTE — Telephone Encounter (Signed)
Pre-Visit Call completed with patient and chart updated.   Pre-Visit Info documented in Specialty Comments under [employer].    

## 2015-07-13 ENCOUNTER — Ambulatory Visit (HOSPITAL_BASED_OUTPATIENT_CLINIC_OR_DEPARTMENT_OTHER)
Admission: RE | Admit: 2015-07-13 | Discharge: 2015-07-13 | Disposition: A | Discharge status: Home / Self Care | Payer: BC Managed Care – PPO | Source: Ambulatory Visit | Attending: Family Medicine | Admitting: Family Medicine

## 2015-07-13 ENCOUNTER — Ambulatory Visit (INDEPENDENT_AMBULATORY_CARE_PROVIDER_SITE_OTHER): Payer: BC Managed Care – PPO | Admitting: Family Medicine

## 2015-07-13 ENCOUNTER — Encounter: Payer: Self-pay | Admitting: Family Medicine

## 2015-07-13 VITALS — BP 140/84 | HR 84 | Temp 98.6°F | Ht 61.0 in | Wt 101.5 lb

## 2015-07-13 DIAGNOSIS — Z780 Asymptomatic menopausal state: Secondary | ICD-10-CM

## 2015-07-13 DIAGNOSIS — R918 Other nonspecific abnormal finding of lung field: Secondary | ICD-10-CM | POA: Diagnosis not present

## 2015-07-13 DIAGNOSIS — R059 Cough, unspecified: Secondary | ICD-10-CM

## 2015-07-13 DIAGNOSIS — R000 Tachycardia, unspecified: Secondary | ICD-10-CM

## 2015-07-13 DIAGNOSIS — R05 Cough: Secondary | ICD-10-CM | POA: Diagnosis not present

## 2015-07-13 DIAGNOSIS — Z0000 Encounter for general adult medical examination without abnormal findings: Secondary | ICD-10-CM | POA: Diagnosis not present

## 2015-07-13 DIAGNOSIS — R030 Elevated blood-pressure reading, without diagnosis of hypertension: Secondary | ICD-10-CM

## 2015-07-13 DIAGNOSIS — M810 Age-related osteoporosis without current pathological fracture: Secondary | ICD-10-CM | POA: Diagnosis not present

## 2015-07-13 DIAGNOSIS — K219 Gastro-esophageal reflux disease without esophagitis: Secondary | ICD-10-CM

## 2015-07-13 DIAGNOSIS — J329 Chronic sinusitis, unspecified: Secondary | ICD-10-CM | POA: Diagnosis not present

## 2015-07-13 DIAGNOSIS — R0989 Other specified symptoms and signs involving the circulatory and respiratory systems: Secondary | ICD-10-CM | POA: Insufficient documentation

## 2015-07-13 DIAGNOSIS — Z8619 Personal history of other infectious and parasitic diseases: Secondary | ICD-10-CM | POA: Insufficient documentation

## 2015-07-13 DIAGNOSIS — M069 Rheumatoid arthritis, unspecified: Secondary | ICD-10-CM

## 2015-07-13 DIAGNOSIS — D649 Anemia, unspecified: Secondary | ICD-10-CM

## 2015-07-13 DIAGNOSIS — R700 Elevated erythrocyte sedimentation rate: Secondary | ICD-10-CM | POA: Diagnosis not present

## 2015-07-13 DIAGNOSIS — IMO0001 Reserved for inherently not codable concepts without codable children: Secondary | ICD-10-CM

## 2015-07-13 DIAGNOSIS — R053 Chronic cough: Secondary | ICD-10-CM

## 2015-07-13 DIAGNOSIS — M06039 Rheumatoid arthritis without rheumatoid factor, unspecified wrist: Secondary | ICD-10-CM

## 2015-07-13 MED ORDER — MONTELUKAST SODIUM 10 MG PO TABS: 10.0000 mg | ORAL_TABLET | Freq: Every day | ORAL | Status: DC

## 2015-07-13 MED ORDER — PREDNISONE 2.5 MG PO TABS: ORAL_TABLET | ORAL | Status: DC

## 2015-07-13 MED ORDER — HYDROCODONE-ACETAMINOPHEN 5-325 MG PO TABS: 1.0000 | ORAL_TABLET | Freq: Four times a day (QID) | ORAL | Status: DC | PRN

## 2015-07-13 MED ORDER — RANITIDINE HCL 300 MG PO TABS: 150.0000 mg | ORAL_TABLET | Freq: Every day | ORAL | Status: DC

## 2015-07-13 MED ORDER — PANTOPRAZOLE SODIUM 40 MG PO TBEC: 40.0000 mg | DELAYED_RELEASE_TABLET | Freq: Every day | ORAL | Status: DC

## 2015-07-13 MED ORDER — CEFDINIR 300 MG PO CAPS: 300.0000 mg | ORAL_CAPSULE | Freq: Two times a day (BID) | ORAL | Status: AC

## 2015-07-13 MED ORDER — FLUTICASONE PROPIONATE 50 MCG/ACT NA SUSP: 2.0000 | Freq: Every day | NASAL | Status: DC

## 2015-07-13 MED ORDER — MELOXICAM 7.5 MG PO TABS: 7.5000 mg | ORAL_TABLET | Freq: Every day | ORAL | Status: DC

## 2015-07-13 MED FILL — FLUTICASONE PROP 50 MCG SPR: 50 | 30 days supply | Qty: 16 | Fill #0 | Status: TO

## 2015-07-13 MED FILL — PANTOPRAZOLE SOD DR 40 MG T: 40 | 90 days supply | Qty: 90 | Fill #0

## 2015-07-13 MED FILL — HYDROCODON-APAP 5-325: 5-325 | 10 days supply | Qty: 40 | Fill #0

## 2015-07-13 NOTE — Progress Notes (Signed)
Subjective:    Patient ID: Jamie Cox, female    DOB: 22-Feb-1961, 55 y.o.   MRN: [id number]  Chief Complaint  Patient presents with  . Establish Care    HPI Patient is in today for  Annual Physical exam. She is complaining of sinus drainage, cough, postnasal drip, rhinorrhea and fatigue. She has chest pain with coughing. Tylenol has been mildly helpful. Symptoms have been worsening for a couple of weeks. She has a past medical history significant for chronic sinusitis, rheumatoid arthritis, osteoporosis, reflux. Denies fevers chills today. Denies palpfevers/GI or GU c/o. Taking meds as prescribed  Past Medical History  Diagnosis Date  . Rheumatoid arthritis(714.0)   . Personal history of other diseases of circulatory system   . Unspecified deficiency anemia     microcytic  . Chronic sinusitis     Followed by Dr. Jenne Pane  . Environmental allergies   . GERD (gastroesophageal reflux disease)   . History of chicken pox   . Osteoporosis, unspecified     steroid induced  . Hypercalcemia 07/19/2015    Past Surgical History  Procedure Laterality Date  . Nasal sinus surgery  06/2012    Family History  Problem Relation Age of Onset  . Liver cancer Maternal Grandmother   . Hypertension Maternal Grandmother   . Heart attack Maternal Grandmother   . Scleroderma Mother   . Heart disease Mother   . Kidney disease Mother   . Prostate cancer Father   . Diabetes Father   . Heart disease Father     CHF  . Arthritis Father     s/p hip replacement  . Lung cancer Paternal Uncle   . Coronary artery disease Paternal Grandmother   . Hypertension Paternal Grandmother   . Liver cancer Paternal Grandmother   . Colon cancer Neg Hx   . Hyperlipidemia Son   . Alzheimer's disease Paternal Grandfather   . Other Sister     Cardiomegaly  . Arthritis Sister   . Heart disease Sister     rare  . Sleep apnea Brother   . Arthritis Daughter   . Lupus Cousin   . Arthritis Cousin     Social  History   Social History  . Marital Status: Married    Spouse Name: N/A  . Number of Children: 2  . Years of Education: N/A   Occupational History  . Data processing manager    Social History Main Topics  . Smoking status: Never Smoker   . Smokeless tobacco: Never Used  . Alcohol Use: No  . Drug Use: No  . Sexual Activity: Not Currently   Other Topics Concern  . Not on file   Social History Narrative    Outpatient Prescriptions Prior to Visit  Medication Sig Dispense Refill  . acetaminophen (TYLENOL) 650 MG CR tablet Take 650 mg by mouth every 8 (eight) hours as needed. For pain    . albuterol (PROVENTIL HFA;VENTOLIN HFA) 108 (90 BASE) MCG/ACT inhaler Inhale 1-2 puffs into the lungs every 6 (six) hours as needed for wheezing or shortness of breath (cough). For shortness of breath/wheezing 3.7 g 1  . calcium-vitamin D (CALCIUM 500/D) 500-200 MG-UNIT per tablet 1 tablet.    Marland Kitchen loratadine (CLARITIN) 10 MG tablet 10 mg.    . Multiple Vitamin (MULTIVITAMIN) capsule Take 1 capsule by mouth daily.      . diclofenac (VOLTAREN) 75 MG EC tablet Take 75 mg by mouth.    . fluticasone (FLONASE) 50 MCG/ACT nasal spray  Place 2 sprays into both nostrils daily. 16 g 3  . HYDROcodone-acetaminophen (NORCO/VICODIN) 5-325 MG per tablet Take 1 tablet by mouth every 6 (six) hours as needed for moderate pain. 30 tablet 0  . ibandronate (BONIVA) 150 MG tablet Take one tablet every 30 days.  Take in AM with full glass of water on empty stomach. Don't lie down for 30 min or take anything by mouth. 1 tablet 2  . pantoprazole (PROTONIX) 40 MG tablet Take 1 tablet (40 mg total) by mouth daily. 30 tablet 11  . predniSONE (DELTASONE) 2.5 MG tablet TAKE ONE TABLET BY MOUTH ONCE DAILY WITH BREAKFAST 30 tablet 0  . ranitidine (ZANTAC) 300 MG tablet TAKE ONE-HALF TABLET BY MOUTH AT BEDTIME 30 tablet 0  . hydroxychloroquine (PLAQUENIL) 200 MG tablet Take 200 mg by mouth. Reported on 07/13/2015    . meloxicam (MOBIC) 7.5 MG  tablet Take 1 tablet (7.5 mg total) by mouth daily as needed for pain. Take a second tablet if needed.  Do not take more than 2 tablets in 1 day. (Patient not taking: Reported on 07/13/2015) 30 tablet 1   No facility-administered medications prior to visit.    Allergies  Allergen Reactions  . Avelox [Moxifloxacin Hcl In Nacl] Diarrhea, Nausea And Vomiting and Other (See Comments)    Dizziness, Cold chills   . Bactrim [Sulfamethoxazole-Trimethoprim] Hives    Review of Systems  Constitutional: Negative for fever, chills and malaise/fatigue.  HENT: Positive for congestion. Negative for hearing loss.   Eyes: Negative for discharge.  Respiratory: Positive for cough and sputum production. Negative for shortness of breath.   Cardiovascular: Positive for chest pain. Negative for palpitations and leg swelling.  Gastrointestinal: Negative for heartburn, nausea, vomiting, abdominal pain, diarrhea, constipation and blood in stool.  Genitourinary: Negative for dysuria, urgency, frequency and hematuria.  Musculoskeletal: Negative for myalgias, back pain and falls.  Skin: Negative for rash.  Neurological: Positive for headaches. Negative for dizziness, sensory change, loss of consciousness and weakness.  Endo/Heme/Allergies: Negative for environmental allergies. Does not bruise/bleed easily.  Psychiatric/Behavioral: Negative for depression and suicidal ideas. The patient is not nervous/anxious and does not have insomnia.        Objective:    Physical Exam  Constitutional: She is oriented to person, place, and time. She appears well-developed and well-nourished. No distress.  HENT:  Head: Normocephalic and atraumatic.  Right Ear: External ear normal.  Left Ear: External ear normal.  Nose: Nose normal.  Mouth/Throat: Oropharynx is clear and moist.  Eyes: Conjunctivae and EOM are normal. Pupils are equal, round, and reactive to light. Right eye exhibits no discharge. Left eye exhibits no discharge.   Neck: Normal range of motion. Neck supple. No JVD present. No thyromegaly present.  Cardiovascular: Normal rate, regular rhythm, normal heart sounds and intact distal pulses.   No murmur heard. Pulmonary/Chest: Effort normal and breath sounds normal. No respiratory distress. She has no wheezes. She has no rales. She exhibits no tenderness.  Abdominal: Soft. Bowel sounds are normal. She exhibits no distension and no mass. There is no tenderness. There is no rebound and no guarding.  Musculoskeletal: Normal range of motion. She exhibits no edema or tenderness.  Lymphadenopathy:    She has no cervical adenopathy.  Neurological: She is alert and oriented to person, place, and time. She has normal reflexes. No cranial nerve deficit.  Skin: Skin is warm and dry. No rash noted. She is not diaphoretic. No erythema.  Psychiatric: She has a normal  mood and affect. Her behavior is normal. Judgment and thought content normal.  Nursing note and vitals reviewed.   BP 140/84 mmHg  Pulse 84  Temp(Src) 98.6 F (37 C) (Oral)  Ht 5\' 1"  (1.549 m)  Wt 101 lb 8 oz (46.04 kg)  BMI 19.19 kg/m2  SpO2 95% Wt Readings from Last 3 Encounters:  07/13/15 101 lb 8 oz (46.04 kg)  12/19/14 96 lb 3.2 oz (43.636 kg)  08/08/14 97 lb 9.6 oz (44.271 kg)     Lab Results  Component Value Date   WBC 5.2 07/13/2015   HGB 10.9* 07/13/2015   HCT 35.5* 07/13/2015   PLT 235.0 07/13/2015   GLUCOSE 74 07/13/2015   CHOL 190 07/13/2015   TRIG 111.0 07/13/2015   HDL 73.90 07/13/2015   LDLCALC 94 07/13/2015   ALT 14 07/13/2015   AST 20 07/13/2015   NA 140 07/13/2015   K 3.7 07/13/2015   CL 104 07/13/2015   CREATININE 0.65 07/13/2015   BUN 12 07/13/2015   CO2 31 07/13/2015   TSH 0.49 07/13/2015    Lab Results  Component Value Date   TSH 0.49 07/13/2015   Lab Results  Component Value Date   WBC 5.2 07/13/2015   HGB 10.9* 07/13/2015   HCT 35.5* 07/13/2015   MCV 74.5* 07/13/2015   PLT 235.0 07/13/2015   Lab  Results  Component Value Date   NA 140 07/13/2015   K 3.7 07/13/2015   CO2 31 07/13/2015   GLUCOSE 74 07/13/2015   BUN 12 07/13/2015   CREATININE 0.65 07/13/2015   BILITOT 0.2 07/13/2015   ALKPHOS 53 07/13/2015   AST 20 07/13/2015   ALT 14 07/13/2015   PROT 8.6* 07/13/2015   ALBUMIN 4.3 07/13/2015   CALCIUM 11.0* 07/13/2015   GFR 120.90 07/13/2015   Lab Results  Component Value Date   CHOL 190 07/13/2015   Lab Results  Component Value Date   HDL 73.90 07/13/2015   Lab Results  Component Value Date   LDLCALC 94 07/13/2015   Lab Results  Component Value Date   TRIG 111.0 07/13/2015   Lab Results  Component Value Date   CHOLHDL 3 07/13/2015   No results found for: HGBA1C     Assessment & Plan:   Problem List Items Addressed This Visit    Chronic cough   Relevant Medications   fluticasone (FLONASE) 50 MCG/ACT nasal spray   Chronic sinusitis    Flared recently started on Cefdinir and Mucinex report if no improvement      Relevant Medications   cefdinir (OMNICEF) 300 MG capsule   fluticasone (FLONASE) 50 MCG/ACT nasal spray   predniSONE (DELTASONE) 2.5 MG tablet   Elevated blood pressure    Well controlled Encouraged heart healthy diet such as the DASH diet and exercise as tolerated.       Relevant Orders   VITAMIN D 25 Hydroxy (Vit-D Deficiency, Fractures) (Completed)   CBC (Completed)   Comprehensive metabolic panel (Completed)   Lipid panel (Completed)   TSH (Completed)   DG Bone Density   Sedimentation rate (Completed)   GERD (gastroesophageal reflux disease)    Avoid offending foods, start probiotics. Do not eat large meals in late evening and consider raising head of bed. Continue pantoprazole and consider adding Ranitidine      Relevant Medications   ranitidine (ZANTAC) 300 MG tablet   pantoprazole (PROTONIX) 40 MG tablet   Other Relevant Orders   VITAMIN D 25 Hydroxy (Vit-D Deficiency, Fractures) (  Completed)   CBC (Completed)    Comprehensive metabolic panel (Completed)   Lipid panel (Completed)   TSH (Completed)   DG Bone Density   Sedimentation rate (Completed)   Hypercalcemia    Minimize in diet, check PTH and repeat cmp      Osteoporosis    dexa scan ordered. Encouraged vitamin D 2000 IU daily      Relevant Orders   VITAMIN D 25 Hydroxy (Vit-D Deficiency, Fractures) (Completed)   CBC (Completed)   Comprehensive metabolic panel (Completed)   Lipid panel (Completed)   TSH (Completed)   DG Bone Density   Sedimentation rate (Completed)   Preventative health care    Patient encouraged to maintain heart healthy diet, regular exercise, adequate sleep. Consider daily probiotics. Take medications as prescribed. Labs ordered and reviewed. MGM ordered, Dexa scan ordered      Relevant Orders   VITAMIN D 25 Hydroxy (Vit-D Deficiency, Fractures) (Completed)   CBC (Completed)   Comprehensive metabolic panel (Completed)   Lipid panel (Completed)   TSH (Completed)   DG Bone Density   Sedimentation rate (Completed)   Rheumatoid arthritis (HCC) - Primary    [facility] rheumatology Tried Plaquenil one month, is on hold now til reviewed by opthamology MTX worked well but unable to keep up with required blood work      Relevant Medications   HYDROcodone-acetaminophen (NORCO/VICODIN) 5-325 MG tablet   meloxicam (MOBIC) 7.5 MG tablet   predniSONE (DELTASONE) 2.5 MG tablet   Other Relevant Orders   VITAMIN D 25 Hydroxy (Vit-D Deficiency, Fractures) (Completed)   CBC (Completed)   Comprehensive metabolic panel (Completed)   Lipid panel (Completed)   TSH (Completed)   DG Bone Density   Sedimentation rate (Completed)   VITAMIN D 25 Hydroxy (Vit-D Deficiency, Fractures) (Completed)   CBC (Completed)   Comprehensive metabolic panel (Completed)   Lipid panel (Completed)   TSH (Completed)   DG Bone Density   Sedimentation rate (Completed)   Tachycardia    RRR      Relevant Orders   VITAMIN D 25 Hydroxy (Vit-D  Deficiency, Fractures) (Completed)   CBC (Completed)   Comprehensive metabolic panel (Completed)   Lipid panel (Completed)   TSH (Completed)   DG Bone Density   Sedimentation rate (Completed)    Other Visit Diagnoses    Elevated sed rate        Relevant Orders    VITAMIN D 25 Hydroxy (Vit-D Deficiency, Fractures) (Completed)    CBC (Completed)    Comprehensive metabolic panel (Completed)    Lipid panel (Completed)    TSH (Completed)    DG Bone Density    Sedimentation rate (Completed)    Cough        Relevant Orders    VITAMIN D 25 Hydroxy (Vit-D Deficiency, Fractures) (Completed)    CBC (Completed)    Comprehensive metabolic panel (Completed)    Lipid panel (Completed)    TSH (Completed)    DG Bone Density    Sedimentation rate (Completed)    DG Chest 2 View (Completed)    Anemia, unspecified anemia type        Relevant Orders    VITAMIN D 25 Hydroxy (Vit-D Deficiency, Fractures) (Completed)    CBC (Completed)    Comprehensive metabolic panel (Completed)    Lipid panel (Completed)    TSH (Completed)    DG Bone Density    Sedimentation rate (Completed)    Postmenopausal estrogen deficiency  I have discontinued Ms. Herdegen meloxicam, ibandronate, and diclofenac. I have also changed her HYDROcodone-acetaminophen and ranitidine. Additionally, I am having her start on meloxicam, montelukast, and cefdinir. Lastly, I am having her maintain her acetaminophen, multivitamin, loratadine, calcium-vitamin D, albuterol, hydroxychloroquine, fluticasone, pantoprazole, and predniSONE.  Meds ordered this encounter  Medications  . HYDROcodone-acetaminophen (NORCO/VICODIN) 5-325 MG tablet    Sig: Take 1 tablet by mouth every 6 (six) hours as needed for moderate pain.    Dispense:  40 tablet    Refill:  0  . meloxicam (MOBIC) 7.5 MG tablet    Sig: Take 1 tablet (7.5 mg total) by mouth daily. Take 1-2 tablets by mouth daily as needed for pain.    Dispense:  60 tablet     Refill:  3  . montelukast (SINGULAIR) 10 MG tablet    Sig: Take 1 tablet (10 mg total) by mouth at bedtime.    Dispense:  30 tablet    Refill:  5  . cefdinir (OMNICEF) 300 MG capsule    Sig: Take 1 capsule (300 mg total) by mouth 2 (two) times daily. Take 1 capsule by mouth twice daily as needed x14 days    Dispense:  28 capsule    Refill:  0  . ranitidine (ZANTAC) 300 MG tablet    Sig: Take 0.5 tablets (150 mg total) by mouth at bedtime.    Dispense:  90 tablet    Refill:  1  . fluticasone (FLONASE) 50 MCG/ACT nasal spray    Sig: Place 2 sprays into both nostrils daily.    Dispense:  16 g    Refill:  3  . pantoprazole (PROTONIX) 40 MG tablet    Sig: Take 1 tablet (40 mg total) by mouth daily.    Dispense:  90 tablet    Refill:  1  . predniSONE (DELTASONE) 2.5 MG tablet    Sig: TAKE ONE TABLET BY MOUTH ONCE DAILY WITH BREAKFAST    Dispense:  90 tablet    Refill:  1     Danise Edge, MD

## 2015-07-13 NOTE — Assessment & Plan Note (Signed)
[facility] rheumatology Tried Plaquenil one month, is on hold now til reviewed by opthamology MTX worked well but unable to keep up with required blood work

## 2015-07-13 NOTE — Progress Notes (Signed)
Pre visit review using our clinic review tool, if applicable. No additional management support is needed unless otherwise documented below in the visit note. 

## 2015-07-13 NOTE — Patient Instructions (Addendum)
Curcumen caps daily, [employer] Meloxicam 7.5 to 15 mg daily  Claritin twice daily Nasal saline after being outside  [employer] probiotic 10 strain [employer] Cough, Adult Coughing is a reflex that clears your throat and your airways. Coughing helps to heal and protect your lungs. It is normal to cough occasionally, but a cough that happens with other symptoms or lasts a long time may be a sign of a condition that needs treatment. A cough may last only 2-3 weeks (acute), or it may last longer than 8 weeks (chronic). CAUSES Coughing is commonly caused by:  Breathing in substances that irritate your lungs.  A viral or bacterial respiratory infection.  Allergies.  Asthma.  Postnasal drip.  Smoking.  Acid backing up from the stomach into the esophagus (gastroesophageal reflux).  Certain medicines.  Chronic lung problems, including COPD (or rarely, lung cancer).  Other medical conditions such as heart failure. HOME CARE INSTRUCTIONS  Pay attention to any changes in your symptoms. Take these actions to help with your discomfort:  Take medicines only as told by your health care provider.  If you were prescribed an antibiotic medicine, take it as told by your health care provider. Do not stop taking the antibiotic even if you start to feel better.  Talk with your health care provider before you take a cough suppressant medicine.  Drink enough fluid to keep your urine clear or pale yellow.  If the air is dry, use a cold steam vaporizer or humidifier in your bedroom or your home to help loosen secretions.  Avoid anything that causes you to cough at work or at home.  If your cough is worse at night, try sleeping in a semi-upright position.  Avoid cigarette smoke. If you smoke, quit smoking. If you need help quitting, ask your health care provider.  Avoid caffeine.  Avoid alcohol.  Rest as needed. SEEK MEDICAL CARE IF:   You have new symptoms.  You cough up  pus.  Your cough does not get better after 2-3 weeks, or your cough gets worse.  You cannot control your cough with suppressant medicines and you are losing sleep.  You develop pain that is getting worse or pain that is not controlled with pain medicines.  You have a fever.  You have unexplained weight loss.  You have night sweats. SEEK IMMEDIATE MEDICAL CARE IF:  You cough up blood.  You have difficulty breathing.  Your heartbeat is very fast.   This information is not intended to replace advice given to you by your health care provider. Make sure you discuss any questions you have with your health care provider.   Document Released: 11/19/2010 Document Revised: 02/11/2015 Document Reviewed: 07/30/2014 Elsevier Interactive Patient Education [employer]

## 2015-07-14 ENCOUNTER — Telehealth: Payer: Self-pay | Admitting: Family Medicine

## 2015-07-14 LAB — COMPREHENSIVE METABOLIC PANEL
ALBUMIN: 4.3 g/dL (ref 3.5–5.2)
ALT: 14 U/L (ref 0–35)
AST: 20 U/L (ref 0–37)
Alkaline Phosphatase: 53 U/L (ref 39–117)
BILIRUBIN TOTAL: 0.2 mg/dL (ref 0.2–1.2)
BUN: 12 mg/dL (ref 6–23)
CALCIUM: 11 mg/dL — AB (ref 8.4–10.5)
CO2: 31 mEq/L (ref 19–32)
CREATININE: 0.65 mg/dL (ref 0.40–1.20)
Chloride: 104 mEq/L (ref 96–112)
GFR: 120.9 mL/min (ref >60.00)
Glucose, Bld: 74 mg/dL (ref 70–99)
Potassium: 3.7 mEq/L (ref 3.5–5.1)
Sodium: 140 mEq/L (ref 135–145)
TOTAL PROTEIN: 8.6 g/dL — AB (ref 6.0–8.3)

## 2015-07-14 LAB — LIPID PANEL
CHOLESTEROL: 190 mg/dL (ref 0–200)
HDL: 73.9 mg/dL (ref >39.00)
LDL Cholesterol: 94 mg/dL (ref 0–99)
NonHDL: 116.45
Total CHOL/HDL Ratio: 3
Triglycerides: 111 mg/dL (ref 0.0–149.0)
VLDL: 22.2 mg/dL (ref 0.0–40.0)

## 2015-07-14 LAB — CBC
HCT: 35.5 % — ABNORMAL LOW (ref 36.0–46.0)
Hemoglobin: 10.9 g/dL — ABNORMAL LOW (ref 12.0–15.0)
MCHC: 30.7 g/dL (ref 30.0–36.0)
MCV: 74.5 fl — AB (ref 78.0–100.0)
PLATELETS: 235 10*3/uL (ref 150.0–400.0)
RBC: 4.77 Mil/uL (ref 3.87–5.11)
RDW: 16.1 % — ABNORMAL HIGH (ref 11.5–15.5)
WBC: 5.2 10*3/uL (ref 4.0–10.5)

## 2015-07-14 LAB — TSH: TSH: 0.49 u[IU]/mL (ref 0.35–4.50)

## 2015-07-14 LAB — VITAMIN D 25 HYDROXY (VIT D DEFICIENCY, FRACTURES): VITD: 43.02 ng/mL (ref 30.00–100.00)

## 2015-07-14 LAB — SEDIMENTATION RATE: SED RATE: 53 mm/h — AB (ref 0–22)

## 2015-07-14 NOTE — Telephone Encounter (Signed)
Patient informed of chest xray results on 07/13/2015

## 2015-07-14 NOTE — Telephone Encounter (Signed)
Pt is returning your call for lab results.    CB: [phone]

## 2015-07-16 ENCOUNTER — Other Ambulatory Visit: Payer: Self-pay | Admitting: Family Medicine

## 2015-07-16 DIAGNOSIS — N63 Unspecified lump in unspecified breast: Secondary | ICD-10-CM

## 2015-07-19 ENCOUNTER — Encounter: Payer: Self-pay | Admitting: Family Medicine

## 2015-07-19 HISTORY — DX: Hypercalcemia: E83.52

## 2015-07-19 NOTE — Assessment & Plan Note (Signed)
Minimize in diet, check PTH and repeat cmp

## 2015-07-19 NOTE — Assessment & Plan Note (Signed)
Patient encouraged to maintain heart healthy diet, regular exercise, adequate sleep. Consider daily probiotics. Take medications as prescribed. Labs ordered and reviewed. MGM ordered, Dexa scan ordered

## 2015-07-19 NOTE — Assessment & Plan Note (Signed)
RRR 

## 2015-07-19 NOTE — Assessment & Plan Note (Signed)
Flared recently started on Cefdinir and Mucinex report if no improvement

## 2015-07-19 NOTE — Assessment & Plan Note (Signed)
Well controlled. Encouraged heart healthy diet such as the DASH diet and exercise as tolerated.  

## 2015-07-19 NOTE — Assessment & Plan Note (Addendum)
dexa scan ordered. Encouraged vitamin D 2000 IU daily

## 2015-07-19 NOTE — Assessment & Plan Note (Signed)
Avoid offending foods, start probiotics. Do not eat large meals in late evening and consider raising head of bed. Continue pantoprazole and consider adding Ranitidine

## 2015-07-20 ENCOUNTER — Telehealth: Payer: Self-pay | Admitting: Family Medicine

## 2015-07-20 MED ORDER — MELOXICAM 7.5 MG PO TABS: ORAL_TABLET | ORAL | Status: DC

## 2015-07-20 NOTE — Telephone Encounter (Signed)
1-2 tabs po daily prn pain with food

## 2015-07-20 NOTE — Telephone Encounter (Signed)
Pharmacy needs clarification on Meloxicam instructions.  There are 2 sets.  Advise what you want her to take and I will change and send in.

## 2015-08-07 ENCOUNTER — Other Ambulatory Visit: Payer: BC Managed Care – PPO

## 2015-08-07 ENCOUNTER — Ambulatory Visit
Admission: RE | Admit: 2015-08-07 | Discharge: 2015-08-07 | Disposition: A | Discharge status: Home / Self Care | Payer: BC Managed Care – PPO | Source: Ambulatory Visit | Attending: Family Medicine | Admitting: Family Medicine

## 2015-08-07 DIAGNOSIS — N63 Unspecified lump in unspecified breast: Secondary | ICD-10-CM

## 2015-08-28 ENCOUNTER — Ambulatory Visit (INDEPENDENT_AMBULATORY_CARE_PROVIDER_SITE_OTHER): Payer: BC Managed Care – PPO | Admitting: Family Medicine

## 2015-08-28 ENCOUNTER — Encounter: Payer: Self-pay | Admitting: Family Medicine

## 2015-08-28 VITALS — BP 118/78 | HR 88 | Temp 98.3°F | Ht 61.0 in | Wt 103.1 lb

## 2015-08-28 DIAGNOSIS — M069 Rheumatoid arthritis, unspecified: Secondary | ICD-10-CM

## 2015-08-28 DIAGNOSIS — K219 Gastro-esophageal reflux disease without esophagitis: Secondary | ICD-10-CM | POA: Diagnosis not present

## 2015-08-28 DIAGNOSIS — R079 Chest pain, unspecified: Secondary | ICD-10-CM

## 2015-08-28 DIAGNOSIS — R030 Elevated blood-pressure reading, without diagnosis of hypertension: Secondary | ICD-10-CM | POA: Diagnosis not present

## 2015-08-28 DIAGNOSIS — R0782 Intercostal pain: Secondary | ICD-10-CM

## 2015-08-28 DIAGNOSIS — D539 Nutritional anemia, unspecified: Secondary | ICD-10-CM

## 2015-08-28 DIAGNOSIS — IMO0001 Reserved for inherently not codable concepts without codable children: Secondary | ICD-10-CM

## 2015-08-28 DIAGNOSIS — M810 Age-related osteoporosis without current pathological fracture: Secondary | ICD-10-CM

## 2015-08-28 DIAGNOSIS — M7918 Myalgia, other site: Secondary | ICD-10-CM

## 2015-08-28 DIAGNOSIS — Z0000 Encounter for general adult medical examination without abnormal findings: Secondary | ICD-10-CM

## 2015-08-28 DIAGNOSIS — Z780 Asymptomatic menopausal state: Secondary | ICD-10-CM | POA: Diagnosis not present

## 2015-08-28 DIAGNOSIS — Z1159 Encounter for screening for other viral diseases: Secondary | ICD-10-CM

## 2015-08-28 DIAGNOSIS — Z114 Encounter for screening for human immunodeficiency virus [HIV]: Secondary | ICD-10-CM

## 2015-08-28 DIAGNOSIS — R05 Cough: Secondary | ICD-10-CM

## 2015-08-28 DIAGNOSIS — R053 Chronic cough: Secondary | ICD-10-CM

## 2015-08-28 LAB — HEPATITIS C ANTIBODY: HCV Ab: NEGATIVE

## 2015-08-28 LAB — VITAMIN D 25 HYDROXY (VIT D DEFICIENCY, FRACTURES): VITD: 37.24 ng/mL (ref 30.00–100.00)

## 2015-08-28 LAB — HIV ANTIBODY (ROUTINE TESTING W REFLEX): HIV 1&2 Ab, 4th Generation: NONREACTIVE

## 2015-08-28 MED ORDER — RANITIDINE HCL 300 MG PO TABS: 150.0000 mg | ORAL_TABLET | Freq: Every day | ORAL | Status: DC

## 2015-08-28 MED ORDER — FERROUS FUMARATE-FOLIC ACID 324-1 MG PO TABS: 1.0000 | ORAL_TABLET | Freq: Every day | ORAL | Status: DC

## 2015-08-28 NOTE — Assessment & Plan Note (Addendum)
Not taking her Plaquenil until she gets her eye exam. Encouraged to proceed and reestablish with rheumatology

## 2015-08-28 NOTE — Assessment & Plan Note (Signed)
Stop supplement, recheck today with PTH and Vitamin D

## 2015-08-28 NOTE — Assessment & Plan Note (Signed)
Took Boniva for over 5 years, repeat DEXA scan and consider

## 2015-08-28 NOTE — Assessment & Plan Note (Signed)
Well controlled. Encouraged heart healthy diet such as the DASH diet and exercise as tolerated.  

## 2015-08-28 NOTE — Assessment & Plan Note (Signed)
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 Report worsening

## 2015-08-28 NOTE — Assessment & Plan Note (Signed)
Start Hemocyte F today and recehck CBC in 2 months

## 2015-08-28 NOTE — Patient Instructions (Addendum)
Salonpas with lidocaine and Aspercreme.  Call insurance to verify if the DEXA and mammogram is covered by insurances.    Osteoporosis Osteoporosis is the thinning and loss of density in the bones. Osteoporosis makes the bones more brittle, fragile, and likely to break (fracture). Over time, osteoporosis can cause the bones to become so weak that they fracture after a simple fall. The bones most likely to fracture are the bones in the hip, wrist, and spine. CAUSES  The exact cause is not known. RISK FACTORS Anyone can develop osteoporosis. You may be at greater risk if you have a family history of the condition or have poor nutrition. You may also have a higher risk if you are:   Female.   33 years old or older.  A smoker.  Not physically active.   White or Asian.  Slender. SIGNS AND SYMPTOMS  A fracture might be the first sign of the disease, especially if it results from a fall or injury that would not usually cause a bone to break. Other signs and symptoms include:   Low back and neck pain.  Stooped posture.  Height loss. DIAGNOSIS  To make a diagnosis, your health care provider may:  Take a medical history.  Perform a physical exam.  Order tests, such as:  A bone mineral density test.  A dual-energy X-ray absorptiometry test. TREATMENT  The goal of osteoporosis treatment is to strengthen your bones to reduce your risk of a fracture. Treatment may involve:  Making lifestyle changes, such as:  Eating a diet rich in calcium.  Doing weight-bearing and muscle-strengthening exercises.  Stopping tobacco use.  Limiting alcohol intake.  Taking medicine to slow the process of bone loss or to increase bone density.  Monitoring your levels of calcium and vitamin D. HOME CARE INSTRUCTIONS  Include calcium and vitamin D in your diet. Calcium is important for bone health, and vitamin D helps the body absorb calcium.  Perform weight-bearing and muscle-strengthening  exercises as directed by your health care provider.  Do not use any tobacco products, including cigarettes, chewing tobacco, and electronic cigarettes. If you need help quitting, ask your health care provider.  Limit your alcohol intake.  Take medicines only as directed by your health care provider.  Keep all follow-up visits as directed by your health care provider. This is important.  Take precautions at home to lower your risk of falling, such as:  Keeping rooms well lit and clutter free.  Installing safety rails on stairs.  Using rubber mats in the bathroom and other areas that are often wet or slippery. SEEK IMMEDIATE MEDICAL CARE IF:  You fall or injure yourself.    This information is not intended to replace advice given to you by your health care provider. Make sure you discuss any questions you have with your health care provider.   Document Released: 03/02/2005 Document Revised: 06/13/2014 Document Reviewed: 10/31/2013 Elsevier Interactive Patient Education [employer]

## 2015-08-28 NOTE — Progress Notes (Signed)
Pre visit review using our clinic review tool, if applicable. No additional management support is needed unless otherwise documented below in the visit note. 

## 2015-08-28 NOTE — Progress Notes (Signed)
Subjective:    Patient ID: Jamie Cox, female    DOB: July 25, 1960, 55 y.o.   MRN: [id number]  Chief Complaint  Patient presents with  . Follow-up    HPI Patient is in today for follow up.  Patient has been having some chest discomfort ongoing for a few years now , last a short period of time every other night mainly during work has seen cardiologist, and hs no associated symptoms with the chest discomfort. She is under a great deal of stress also caring during the day for her 2 and 14 yo grandchildren despite working at night. 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 Denies palp/SOB/HA/congestion/fevers/GI or GU c/o. Taking meds as prescribed   Past Medical History  Diagnosis Date  . Rheumatoid arthritis(714.0)   . Personal history of other diseases of circulatory system   . Unspecified deficiency anemia     microcytic  . Chronic sinusitis     Followed by Dr. Jenne Pane  . Environmental allergies   . GERD (gastroesophageal reflux disease)   . History of chicken pox   . Osteoporosis, unspecified     steroid induced  . Hypercalcemia 07/19/2015    Past Surgical History  Procedure Laterality Date  . Nasal sinus surgery  06/2012    Family History  Problem Relation Age of Onset  . Liver cancer Maternal Grandmother   . Hypertension Maternal Grandmother   . Heart attack Maternal Grandmother   . Scleroderma Mother   . Heart disease Mother   . Kidney disease Mother   . Prostate cancer Father   . Diabetes Father   . Heart disease Father     CHF  . Arthritis Father     s/p hip replacement  . Lung cancer Paternal Uncle   . Coronary artery disease Paternal Grandmother   . Hypertension Paternal Grandmother   . Liver cancer Paternal Grandmother   . Colon cancer Neg Hx   . Hyperlipidemia Son   . Alzheimer's disease Paternal Grandfather   . Other Sister     Cardiomegaly  .  Arthritis Sister   . Heart disease Sister     rare  . Sleep apnea Brother   . Arthritis Daughter   . Lupus Cousin   . Arthritis Cousin     Social History   Social History  . Marital Status: Married    Spouse Name: N/A  . Number of Children: 2  . Years of Education: N/A   Occupational History  . Data processing manager    Social History Main Topics  . Smoking status: Never Smoker   . Smokeless tobacco: Never Used  . Alcohol Use: No  . Drug Use: No  . Sexual Activity: Not Currently   Other Topics Concern  . Not on file   Social History Narrative    Outpatient Prescriptions Prior to Visit  Medication Sig Dispense Refill  . acetaminophen (TYLENOL) 650 MG CR tablet Take 650 mg by mouth every 8 (eight) hours as needed. For pain    . albuterol (PROVENTIL HFA;VENTOLIN HFA) 108 (90 BASE) MCG/ACT inhaler Inhale 1-2 puffs into the lungs every 6 (six) hours as needed for wheezing or shortness of breath (cough). For shortness of breath/wheezing 3.7 g 1  . calcium-vitamin D (CALCIUM 500/D) 500-200 MG-UNIT per tablet 1 tablet.    . fluticasone (FLONASE) 50 MCG/ACT nasal spray Place 2 sprays into  both nostrils daily. 16 g 3  . HYDROcodone-acetaminophen (NORCO/VICODIN) 5-325 MG tablet Take 1 tablet by mouth every 6 (six) hours as needed for moderate pain. 40 tablet 0  . hydroxychloroquine (PLAQUENIL) 200 MG tablet Take 200 mg by mouth. Reported on 07/13/2015    . loratadine (CLARITIN) 10 MG tablet 10 mg.    . meloxicam (MOBIC) 7.5 MG tablet Take 1-2 tablets by mouth daily as needed for pain. 60 tablet 3  . montelukast (SINGULAIR) 10 MG tablet Take 1 tablet (10 mg total) by mouth at bedtime. 30 tablet 5  . Multiple Vitamin (MULTIVITAMIN) capsule Take 1 capsule by mouth daily.      . pantoprazole (PROTONIX) 40 MG tablet Take 1 tablet (40 mg total) by mouth daily. 90 tablet 1  . predniSONE (DELTASONE) 2.5 MG tablet TAKE ONE TABLET BY MOUTH ONCE DAILY WITH BREAKFAST 90 tablet 1  . ranitidine  (ZANTAC) 300 MG tablet Take 0.5 tablets (150 mg total) by mouth at bedtime. 90 tablet 1   No facility-administered medications prior to visit.    Allergies  Allergen Reactions  . Avelox [Moxifloxacin Hcl In Nacl] Diarrhea, Nausea And Vomiting and Other (See Comments)    Dizziness, Cold chills   . Bactrim [Sulfamethoxazole-Trimethoprim] Hives    Review of Systems  Constitutional: Positive for malaise/fatigue. Negative for fever.  HENT: Negative for congestion.   Eyes: Negative for blurred vision.  Respiratory: Negative for shortness of breath.   Cardiovascular: Positive for chest pain. Negative for palpitations and leg swelling.  Gastrointestinal: Negative for nausea, abdominal pain and blood in stool.  Genitourinary: Negative for dysuria and frequency.  Musculoskeletal: Negative for falls.  Skin: Negative for rash.  Neurological: Negative for dizziness, loss of consciousness and headaches.  Endo/Heme/Allergies: Negative for environmental allergies.  Psychiatric/Behavioral: Negative for depression. The patient is nervous/anxious.        Objective:    Physical Exam  Constitutional: She is oriented to person, place, and time. She appears well-developed and well-nourished. No distress.  HENT:  Head: Normocephalic and atraumatic.  Eyes: Conjunctivae are normal.  Neck: Neck supple. No thyromegaly present.  Cardiovascular: Normal rate, regular rhythm and normal heart sounds.   No murmur heard. Pulmonary/Chest: Effort normal and breath sounds normal. No respiratory distress.  Abdominal: Soft. Bowel sounds are normal. She exhibits no distension and no mass. There is no tenderness.  Musculoskeletal: She exhibits no edema.  Lymphadenopathy:    She has no cervical adenopathy.  Neurological: She is alert and oriented to person, place, and time.  Skin: Skin is warm and dry.  Psychiatric: She has a normal mood and affect. Her behavior is normal.    BP 118/78 mmHg  Pulse 88   Temp(Src) 98.3 F (36.8 C) (Oral)  Ht 5\' 1"  (1.549 m)  Wt 103 lb 2 oz (46.777 kg)  BMI 19.50 kg/m2  SpO2 99% Wt Readings from Last 3 Encounters:  08/28/15 103 lb 2 oz (46.777 kg)  07/13/15 101 lb 8 oz (46.04 kg)  12/19/14 96 lb 3.2 oz (43.636 kg)     Lab Results  Component Value Date   WBC 5.2 07/13/2015   HGB 10.9* 07/13/2015   HCT 35.5* 07/13/2015   PLT 235.0 07/13/2015   GLUCOSE 74 07/13/2015   CHOL 190 07/13/2015   TRIG 111.0 07/13/2015   HDL 73.90 07/13/2015   LDLCALC 94 07/13/2015   ALT 14 07/13/2015   AST 20 07/13/2015   NA 140 07/13/2015   K 3.7 07/13/2015   CL  104 07/13/2015   CREATININE 0.65 07/13/2015   BUN 12 07/13/2015   CO2 31 07/13/2015   TSH 0.49 07/13/2015    Lab Results  Component Value Date   TSH 0.49 07/13/2015   Lab Results  Component Value Date   WBC 5.2 07/13/2015   HGB 10.9* 07/13/2015   HCT 35.5* 07/13/2015   MCV 74.5* 07/13/2015   PLT 235.0 07/13/2015   Lab Results  Component Value Date   NA 140 07/13/2015   K 3.7 07/13/2015   CO2 31 07/13/2015   GLUCOSE 74 07/13/2015   BUN 12 07/13/2015   CREATININE 0.65 07/13/2015   BILITOT 0.2 07/13/2015   ALKPHOS 53 07/13/2015   AST 20 07/13/2015   ALT 14 07/13/2015   PROT 8.6* 07/13/2015   ALBUMIN 4.3 07/13/2015   CALCIUM  08/28/2015     Comment:       Interpretive Guide:                              Intact PTH               Calcium                              ----------               ------- Normal Parathyroid           Normal                   Normal Hypoparathyroidism           Low or Low Normal        Low Hyperparathyroidism      Primary                 Normal or High           High      Secondary               High                     Normal or Low      Tertiary                High                     High Non-Parathyroid   Hypercalcemia              Low or Low Normal        High    GFR 120.90 07/13/2015   Lab Results  Component Value Date   CHOL 190 07/13/2015    Lab Results  Component Value Date   HDL 73.90 07/13/2015   Lab Results  Component Value Date   LDLCALC 94 07/13/2015   Lab Results  Component Value Date   TRIG 111.0 07/13/2015   Lab Results  Component Value Date   CHOLHDL 3 07/13/2015   No results found for: HGBA1C     Assessment & Plan:   Problem List Items Addressed This Visit    Chronic cough    Has not started singulair, encouraged to take Zyrtec, Singulair and Flonase daily. To see if it helps her symptoms that occur at work only      Relevant Orders   Comprehensive metabolic panel   CBC   Deficiency anemia  Start Hemocyte F today and recehck CBC in 2 months      Relevant Medications   Ferrous Fumarate-Folic Acid (HEMOCYTE-F) 324-1 MG TABS   Other Relevant Orders   Comprehensive metabolic panel   CBC   Elevated blood pressure    Well controlled. Encouraged heart healthy diet such as the DASH diet and exercise as tolerated.       Relevant Orders   Comprehensive metabolic panel   CBC   GERD (gastroesophageal reflux disease)    Avoid offending foods, start probiotics. Do not eat large meals in late evening and consider raising head of bed.       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 Report worsening      Relevant Orders   Comprehensive metabolic panel   CBC   Osteoporosis    Took Boniva for over 5 years, repeat DEXA scan and consider      Relevant Orders   DG Bone Density    Comprehensive metabolic panel   CBC   Preventative health care   Relevant Orders   Hepatitis C Antibody (Completed)   HIV antibody (with reflex) (Completed)   Comprehensive metabolic panel   CBC   Rheumatoid arthritis (HCC)    Not taking her Plaquenil until she gets her eye exam. Encouraged to proceed and reestablish with rheumatology      Relevant Orders   Comprehensive metabolic panel   CBC    Other Visit Diagnoses    Postmenopausal estrogen deficiency    -  Primary    Relevant Orders    DG Bone Density    Chest pain, unspecified chest pain type        Relevant Orders    PTH, Intact and Calcium (Completed)    VITAMIN D 25 Hydroxy (Vit-D Deficiency, Fractures) (Completed)    Comprehensive metabolic panel    CBC    Need for hepatitis C screening test        Relevant Orders    Hepatitis C Antibody (Completed)    Comprehensive metabolic panel    CBC    Screening for HIV (human immunodeficiency virus)        Relevant Orders    HIV antibody (with reflex) (Completed)       I am having Ms. Tuft start on Ferrous Fumarate-Folic Acid. I am also having her maintain her acetaminophen, multivitamin, loratadine, calcium-vitamin D, albuterol, hydroxychloroquine, HYDROcodone-acetaminophen, montelukast, fluticasone, pantoprazole, predniSONE, meloxicam, and ranitidine.  Meds ordered this encounter  Medications  . ranitidine (ZANTAC) 300 MG tablet    Sig: Take 0.5 tablets (150 mg total) by mouth at bedtime.    Dispense:  90 tablet    Refill:  1  . Ferrous Fumarate-Folic Acid (HEMOCYTE-F) 324-1 MG TABS    Sig: Take 1 tablet by mouth daily.    Dispense:  30 each    Refill:  5     Danise Edge, MD

## 2015-08-28 NOTE — Assessment & Plan Note (Signed)
Avoid offending foods, start probiotics. Do not eat large meals in late evening and consider raising head of bed.  

## 2015-08-28 NOTE — Assessment & Plan Note (Signed)
RRR today 

## 2015-08-28 NOTE — Assessment & Plan Note (Signed)
Has not started singulair, encouraged to take Zyrtec, Singulair and Flonase daily. To see if it helps her symptoms that occur at work only

## 2015-08-31 LAB — PTH, INTACT AND CALCIUM
Calcium: 9.4 mg/dL (ref 8.4–10.5)
PTH: 44 pg/mL (ref 14–64)

## 2015-09-01 ENCOUNTER — Ambulatory Visit: Payer: BC Managed Care – PPO | Admitting: Family Medicine

## 2015-09-04 ENCOUNTER — Other Ambulatory Visit: Payer: Self-pay | Admitting: Family Medicine

## 2015-09-04 ENCOUNTER — Ambulatory Visit (HOSPITAL_BASED_OUTPATIENT_CLINIC_OR_DEPARTMENT_OTHER)
Admission: RE | Admit: 2015-09-04 | Discharge: 2015-09-04 | Disposition: A | Discharge status: Home / Self Care | Payer: BC Managed Care – PPO | Source: Ambulatory Visit | Attending: Family Medicine | Admitting: Family Medicine

## 2015-09-04 DIAGNOSIS — M8580 Other specified disorders of bone density and structure, unspecified site: Secondary | ICD-10-CM | POA: Insufficient documentation

## 2015-09-04 DIAGNOSIS — Z7952 Long term (current) use of systemic steroids: Secondary | ICD-10-CM | POA: Diagnosis not present

## 2015-09-04 DIAGNOSIS — M810 Age-related osteoporosis without current pathological fracture: Secondary | ICD-10-CM | POA: Diagnosis not present

## 2015-09-04 DIAGNOSIS — Z780 Asymptomatic menopausal state: Secondary | ICD-10-CM | POA: Insufficient documentation

## 2015-09-04 DIAGNOSIS — M069 Rheumatoid arthritis, unspecified: Secondary | ICD-10-CM | POA: Diagnosis not present

## 2015-09-04 NOTE — Telephone Encounter (Signed)
Requesting: Hydrocodone Contract  none UDS   none Last OV  08/28/2015 Last Refill  #40 with 0 refills on 07/13/2015  Please Advise

## 2015-09-04 NOTE — Telephone Encounter (Signed)
Pt is requesting a refill on her HYDROcodone Rx. Pt says that she is currently in the building having a bone density test. She would like to know if PCP could have a Rx ready for pick up before she leave. Pt says that she understand if not but if possible.   CB: [phone]

## 2015-09-07 MED ORDER — HYDROCODONE-ACETAMINOPHEN 5-325 MG PO TABS: 1.0000 | ORAL_TABLET | Freq: Four times a day (QID) | ORAL | Status: DC | PRN

## 2015-09-07 NOTE — Telephone Encounter (Signed)
Sorry -- didn't see this until after hours.

## 2015-09-07 NOTE — Telephone Encounter (Signed)
Called the patient informed to pickup hardcopy at the front desk. 

## 2015-10-07 ENCOUNTER — Telehealth: Payer: Self-pay | Admitting: Family Medicine

## 2015-10-07 NOTE — Telephone Encounter (Signed)
Pt called stating chest pain, left arm pain, and left should pain. Transferred to [facility] with [facility].

## 2015-10-07 NOTE — Telephone Encounter (Signed)
[facility] Day - Client TELEPHONE ADVICE RECORD [facility] Patient Name: Jamie Cox DOB: 1960-11-06 Initial Comment Caller states she has chest pain that comes and goes, feels like an ache , currently having pain in left shoulder and arm. Nurse Assessment Nurse: Debera Lat, RN, Tinnie Gens Date/Time Lamount Cohen Time): 10/07/2015 3:52:30 PM Confirm and document reason for call. If symptomatic, describe symptoms. You must click the next button to save text entered. ---Caller states she has chest pain that comes and goes, feels like an ache , currently having pain in left shoulder and arm. Pain in left arm and shoulder started last night. Has the patient traveled out of the country within the last 30 days? ---No Does the patient have any new or worsening symptoms? ---Yes Will a triage be completed? ---Yes Related visit to physician within the last 2 weeks? ---No Does the PT have any chronic conditions? (i.e. diabetes, asthma, etc.) ---No Is this a behavioral health or substance abuse call? ---No Guidelines Guideline Title Affirmed Question Affirmed Notes Chest Pain Pain also present in shoulder(s) or arm(s) or jaw (Exception: pain is clearly made worse by movement) Final Disposition User Go to ED Now Debera Lat, RN, Tinnie Gens Referrals [facility] - ED Disagree/Comply: Comply

## 2015-10-08 ENCOUNTER — Encounter: Payer: Self-pay | Admitting: Family Medicine

## 2015-10-08 ENCOUNTER — Ambulatory Visit (INDEPENDENT_AMBULATORY_CARE_PROVIDER_SITE_OTHER): Payer: BC Managed Care – PPO | Admitting: Family Medicine

## 2015-10-08 VITALS — BP 118/80 | HR 99 | Temp 97.7°F | Ht 61.0 in | Wt 100.6 lb

## 2015-10-08 DIAGNOSIS — M25512 Pain in left shoulder: Secondary | ICD-10-CM

## 2015-10-08 DIAGNOSIS — R0789 Other chest pain: Secondary | ICD-10-CM | POA: Diagnosis not present

## 2015-10-08 LAB — TROPONIN I: TNIDX: 0 ug/L (ref 0.00–0.06)

## 2015-10-08 NOTE — Telephone Encounter (Signed)
Called to follow up with patient.  Pt states she's better this morning.  She did not go to the ER last night.  States she is no longer having chest pain (chest pain comes and goes), but has neck/shoulder/back pain.  Rates pain 1/10 on 0-10 pain scale.  Pt feels that pain is more muscular pain than anything given the work she does and because she is currently taking care of her husband.  She is self treating with Meloxicam and Extra Strength Tylenol and they seem to be helping.   An appt was offered.   Appt scheduled with [facility] today at 1:45 pm.  She was advised if chest pain returns, new symptoms appear, or current symptoms worsen to go to the ER.

## 2015-10-08 NOTE — Progress Notes (Signed)
Pre visit review using our clinic tool,if applicable. No additional management support is needed unless otherwise documented below in the visit note.  

## 2015-10-08 NOTE — Patient Instructions (Signed)
Please go to the lab today for a blood test. If there is any problem with this test (a blood test for your heart) I will call you Start a baby aspirin daily now, and you will have an appt to see the heart doctor soon  If you have any recurrent chest pain in the meantime please seek care!

## 2015-10-08 NOTE — Progress Notes (Signed)
[facility] [address] [place], [place] [address] [phone]  Date:  10/08/2015   Name:  Jamie Cox   DOB:  08/28/1960   MRN:  [id number]  PCP:  Danise Edge, MD    Chief Complaint: Shoulder Pain   History of Present Illness:  Jamie Cox is a 55 y.o. very pleasant female patient who presents with the following:  History of RA. Here today with complaint of a "tight feeling" in her left arm, shoulder and chest that she noticed yesterday upon awakening in the morning.   She used some meloxicam and it helped.  The pain in her chest is now resolved, but she does feel some tightness in the left trapezius right now.    She has noted this sort of pain off on an for about 6 months or more.  She might notice it every couple of weeks. It can occur any time of day and, can happened with activity or at rest.    She does not have any history of CAD.  There is a family history however No SOB.   She did have a stress test (she thinks it was a stress test at least) several years ago which was negative. This was done for the same type of sx that brought her in today.   She takes a very low dose of prednisone for her RA. She is planning to have an eye test so she can get back on her plaquenil. She does have a rheumatologist in [place] but has not seen them in a little while - she plans to re-establish care and will let me know if she needs a referral or anything else in this regard She is a never smoker   Patient Active Problem List   Diagnosis Date Noted  . Hypercalcemia 07/19/2015  . History of chicken pox   . Intercostal muscle pain 09/20/2013  . Preventative health care 08/06/2013  . GERD (gastroesophageal reflux disease) 08/01/2012  . Tachycardia 07/28/2012  . Health care maintenance 01/31/2012  . Chronic sinusitis 01/31/2012  . Elevated blood pressure 01/31/2012  . 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 Chronic  cough 09/06/2010  . Rheumatoid arthritis (HCC) 05/17/2007  . Deficiency anemia 06/23/2006  . Osteoporosis 06/23/2006    Past Medical History  Diagnosis Date  . Rheumatoid arthritis(714.0)   . Personal history of other diseases of circulatory system   . Unspecified deficiency anemia     microcytic  . Chronic sinusitis     Followed by Dr. Jenne Pane  . Environmental allergies   . GERD (gastroesophageal reflux disease)   . History of chicken pox   . Osteoporosis, unspecified     steroid induced  . Hypercalcemia 07/19/2015    Past Surgical History  Procedure Laterality Date  . Nasal sinus surgery  06/2012    Social History  Substance Use Topics  . Smoking status: Never Smoker   . Smokeless tobacco: Never Used  . Alcohol Use: No    Family History  Problem Relation Age of Onset  . Liver cancer Maternal Grandmother   . Hypertension Maternal Grandmother   . Heart attack Maternal Grandmother   . Scleroderma Mother   . Heart disease Mother   . Kidney disease Mother   . Prostate cancer Father   . Diabetes Father   . Heart disease Father     CHF  . Arthritis Father     s/p hip  replacement  . Lung cancer Paternal Uncle   . Coronary artery disease Paternal Grandmother   . Hypertension Paternal Grandmother   . Liver cancer Paternal Grandmother   . Colon cancer Neg Hx   . Hyperlipidemia Son   . Alzheimer's disease Paternal Grandfather   . Other Sister     Cardiomegaly  . Arthritis Sister   . Heart disease Sister     rare  . Sleep apnea Brother   . Arthritis Daughter   . Lupus Cousin   . Arthritis Cousin     Allergies  Allergen Reactions  . Avelox [Moxifloxacin Hcl In Nacl] Diarrhea, Nausea And Vomiting and Other (See Comments)    Dizziness, Cold chills   . Bactrim [Sulfamethoxazole-Trimethoprim] Hives    Medication list has been reviewed and updated.  Current Outpatient Prescriptions on File Prior to Visit  Medication Sig Dispense Refill  . acetaminophen  (TYLENOL) 650 MG CR tablet Take 650 mg by mouth every 8 (eight) hours as needed. For pain    . albuterol (PROVENTIL HFA;VENTOLIN HFA) 108 (90 BASE) MCG/ACT inhaler Inhale 1-2 puffs into the lungs every 6 (six) hours as needed for wheezing or shortness of breath (cough). For shortness of breath/wheezing 3.7 g 1  . calcium-vitamin D (CALCIUM 500/D) 500-200 MG-UNIT per tablet 1 tablet.    . Ferrous Fumarate-Folic Acid (HEMOCYTE-F) 324-1 MG TABS Take 1 tablet by mouth daily. 30 each 5  . fluticasone (FLONASE) 50 MCG/ACT nasal spray Place 2 sprays into both nostrils daily. 16 g 3  . HYDROcodone-acetaminophen (NORCO/VICODIN) 5-325 MG tablet Take 1 tablet by mouth every 6 (six) hours as needed for moderate pain. 40 tablet 0  . loratadine (CLARITIN) 10 MG tablet 10 mg.    . meloxicam (MOBIC) 7.5 MG tablet Take 1-2 tablets by mouth daily as needed for pain. 60 tablet 3  . montelukast (SINGULAIR) 10 MG tablet Take 1 tablet (10 mg total) by mouth at bedtime. 30 tablet 5  . Multiple Vitamin (MULTIVITAMIN) capsule Take 1 capsule by mouth daily.      . pantoprazole (PROTONIX) 40 MG tablet Take 1 tablet (40 mg total) by mouth daily. 90 tablet 1  . predniSONE (DELTASONE) 2.5 MG tablet TAKE ONE TABLET BY MOUTH ONCE DAILY WITH BREAKFAST 90 tablet 1  . ranitidine (ZANTAC) 300 MG tablet Take 0.5 tablets (150 mg total) by mouth at bedtime. 90 tablet 1   No current facility-administered medications on file prior to visit.    Review of Systems:  As per HPI- otherwise negative.   Physical Examination: Filed Vitals:   10/08/15 1407  BP: 118/80  Pulse: 99  Temp: 97.7 F (36.5 C)   Filed Vitals:   10/08/15 1407  Height:  (1.549 m)  Weight: 100 lb 9.6 oz (45.632 kg)   Body mass index is 19.02 kg/(m^2). Ideal Body Weight: Weight in (lb) to have BMI = 25: 132  GEN: WDWN, NAD, Non-toxic, A & O x 3, slim build, looks well HEENT: Atraumatic, Normocephalic. Neck supple. No masses, No LAD.  Bilateral TM  wnl, oropharynx normal.  PEERL,EOMI.   Ears and Nose: No external deformity. CV: RRR, No M/G/R. No JVD. No thrill. No extra heart sounds. PULM: CTA B, no wheezes, crackles, rhonchi. No retractions. No resp. distress. No accessory muscle use. She has reproducible tenderness in the left trapezius muscle which she describes as "tight."  No rash on her skin.   ABD: S, NT, ND, +BS. No rebound. No HSM. EXTR: No c/c/e  NEURO Normal gait.  PSYCH: Normally interactive. Conversant. Not depressed or anxious appearing.  Calm demeanor.   EKG: compared with prior.  Mild sinus tach (not flutter), no acute or concerning change noted.   Spoke with Dr. Delton See who is DOD at [facility]- we plan for close follow-up with cardiology  Results for orders placed or performed in visit on 10/08/15  Troponin I  Result Value Ref Range   TNIDX 0.00 0.00 - 0.06 ug/l    Assessment and Plan: Pain in joint of left shoulder  Other chest pain - Plan: EKG 12-Lead, Troponin I  Here today with pain in the left shoulder and CP (now resolved) that she noted yesterday. She also describes intermittent chest pains over the last couple of months. Her CP is atypical and I suspect benign MSK pain but she does need further evaluation Appreciate input by Dr.Nelson, she advised me that [facility] cardiology will contact pt and schedule an appt - per Epic she has an appt this coming Monday Will also check troponin - negative today Asked her to start a baby asa pending her cardiology evaluation and to seek help if recurrent chest pain  Signed Abbe Amsterdam, MD

## 2015-10-12 ENCOUNTER — Encounter: Payer: Self-pay | Admitting: Nurse Practitioner

## 2015-10-12 ENCOUNTER — Telehealth: Payer: Self-pay | Admitting: *Deleted

## 2015-10-12 ENCOUNTER — Ambulatory Visit (INDEPENDENT_AMBULATORY_CARE_PROVIDER_SITE_OTHER): Payer: BC Managed Care – PPO | Admitting: Nurse Practitioner

## 2015-10-12 VITALS — BP 138/82 | HR 88 | Ht 61.0 in | Wt 101.0 lb

## 2015-10-12 DIAGNOSIS — R0789 Other chest pain: Secondary | ICD-10-CM

## 2015-10-12 NOTE — Progress Notes (Signed)
CARDIOLOGY OFFICE NOTE  Date:  10/12/2015    Jamie Cox Date of Birth: 1960/06/26 Medical Record #[id number]  PCP:  Jamie Edge, MD  Cardiologist:  Jamie Cox (NEW/DOD)    Chief Complaint  Patient presents with  . Chest Pain    New patient visit - seen with Dr. Katrinka Cox     History of Present Illness: Jamie Cox is a 55 y.o. female who presents today for a new patient visit. Seen with Dr. Katrinka Cox (DOD/NEW).   No known CAD and has never seen cardiology.   She has a history of RA, chronic sinusitis, GERD, anemia and osteoporosis. FH + for CAD. She is on chronic prednisone use.   Referred here by Dr. Patsy Cox with tightness in the left arm, shoulder and chest. Took Mobic and it helped. This has been off and on for the past 6 months. Apparently has had these symptoms before and has had negative stress test in the very remote past. EKG was not acute.   Comes back today. Here alone. Tells me about this tightness she can have - has had for several months - in the middle of her chest and sometimes in the left neck/shoulder region. Feels "tight" and is tender to touch. Comes and goes - nothing she can do to make it come and go. Will last for a few minutes. 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 She is easily fatigued. Never smoked. Lipids look good.    Past Medical History  Diagnosis Date  . Rheumatoid arthritis(714.0)   . Personal history of other diseases of circulatory system   . Unspecified deficiency anemia     microcytic  . Chronic sinusitis     Followed by Dr. Jenne Cox  . Environmental allergies   . GERD (gastroesophageal reflux disease)   . History of chicken pox   . Osteoporosis, unspecified     steroid induced  . Hypercalcemia 07/19/2015    Past Surgical History  Procedure Laterality Date  . Nasal sinus surgery  06/2012      Medications: Current Outpatient Prescriptions  Medication Sig Dispense Refill  . acetaminophen (TYLENOL) 650 MG CR tablet Take 650 mg by mouth every 8 (eight) hours as needed. For pain    . albuterol (PROVENTIL HFA;VENTOLIN HFA) 108 (90 BASE) MCG/ACT inhaler Inhale 1-2 puffs into the lungs every 6 (six) hours as needed for wheezing or shortness of breath (cough). For shortness of breath/wheezing 3.7 g 1  . calcium-vitamin D (CALCIUM 500/D) 500-200 MG-UNIT per tablet 1 tablet.    . Ferrous Fumarate-Folic Acid (HEMOCYTE-F) 324-1 MG TABS Take 1 tablet by mouth daily. 30 each 5  . fluticasone (FLONASE) 50 MCG/ACT nasal spray Place 2 sprays into both nostrils daily. 16 g 3  . HYDROcodone-acetaminophen (NORCO/VICODIN) 5-325 MG tablet Take 1 tablet by mouth every 6 (six) hours as needed for moderate pain. 40 tablet 0  . loratadine (CLARITIN) 10 MG tablet 10 mg.    . meloxicam (MOBIC) 7.5 MG tablet Take 1-2 tablets by mouth daily as needed for pain. 60 tablet 3  . montelukast (SINGULAIR) 10 MG tablet Take 1 tablet (10 mg total) by mouth at bedtime. 30 tablet 5  . Multiple Vitamin (MULTIVITAMIN) capsule Take 1 capsule by mouth daily.      . pantoprazole (PROTONIX) 40 MG tablet Take 1 tablet (40 mg  total) by mouth daily. 90 tablet 1  . predniSONE (DELTASONE) 2.5 MG tablet TAKE ONE TABLET BY MOUTH ONCE DAILY WITH BREAKFAST 90 tablet 1  . ranitidine (ZANTAC) 300 MG tablet Take 0.5 tablets (150 mg total) by mouth at bedtime. 90 tablet 1   No current facility-administered medications for this visit.    Allergies: Allergies  Allergen Reactions  . Avelox [Moxifloxacin Hcl In Nacl] Diarrhea, Nausea And Vomiting and Other (See Comments)    Dizziness, Cold chills   . Bactrim [Sulfamethoxazole-Trimethoprim] Hives    Social History: The patient  reports that she has never smoked. She has never used smokeless tobacco. She reports that she does not drink alcohol or use illicit drugs.   Family  History: The patient's family history includes Alzheimer's disease in her paternal grandfather; Arthritis in her cousin, daughter, father, and sister; Coronary artery disease in her paternal grandmother; Diabetes in her father; Heart attack in her maternal grandmother; Heart disease in her father, mother, and sister; Hyperlipidemia in her son; Hypertension in her maternal grandmother and paternal grandmother; Kidney disease in her mother; Liver cancer in her maternal grandmother and paternal grandmother; Lung cancer in her paternal uncle; Lupus in her cousin; Other in her sister; Prostate cancer in her father; Scleroderma in her mother; Sleep apnea in her brother. There is no history of Colon cancer.   Review of Systems: Please see the history of present illness.   Otherwise, the review of systems is positive for none.   All other systems are reviewed and negative.   Physical Exam: VS:  BP 138/82 mmHg  Pulse 88  Ht 5\' 1"  (1.549 m)  Wt 101 lb (45.813 kg)  BMI 19.09 kg/m2 .  BMI Body mass index is 19.09 kg/(m^2).  Wt Readings from Last 3 Encounters:  10/12/15 101 lb (45.813 kg)  10/08/15 100 lb 9.6 oz (45.632 kg)  08/28/15 103 lb 2 oz (46.777 kg)    General: Pleasant. Thin. Looks older than her stated age. She is alert and in no acute distress.  HEENT: Normal. Neck: Supple, no JVD, carotid bruits, or masses noted.  Cardiac: Regular rate and rhythm. No murmurs, rubs, or gallops. No edema. She has palpable chest/shoulder pain.  Respiratory:  Lungs are clear to auscultation bilaterally with normal work of breathing.  GI: Soft and nontender.  MS: No deformity or atrophy. Gait and ROM intact. Skin: Warm and dry. Color is normal.  Neuro:  Strength and sensation are intact and no gross focal deficits noted.  Psych: Alert, appropriate and with normal affect.   LABORATORY DATA:  EKG:  EKG is ordered today. This shows NSR with early repolarization - reviewed with Dr. 08/30/15   Lab Results   Component Value Date   WBC 5.2 07/13/2015   HGB 10.9* 07/13/2015   HCT 35.5* 07/13/2015   PLT 235.0 07/13/2015   GLUCOSE 74 07/13/2015   CHOL 190 07/13/2015   TRIG 111.0 07/13/2015   HDL 73.90 07/13/2015   LDLCALC 94 07/13/2015   ALT 14 07/13/2015   AST 20 07/13/2015   NA 140 07/13/2015   K 3.7 07/13/2015   CL 104 07/13/2015   CREATININE 0.65 07/13/2015   BUN 12 07/13/2015   CO2 31 07/13/2015   TSH 0.49 07/13/2015    BNP (last 3 results) No results for input(s): BNP in the last 8760 hours.  ProBNP (last 3 results) No results for input(s): PROBNP in the last 8760 hours.   Other Studies Reviewed Today:   Assessment/Plan:  1. Atypical chest pain - symptoms are reproducible with palpation. Risk factors include FH. Will arrange for GXT to further discern but she is felt to be at low risk. Will tentatively see back as needed unless her study is abnormal. Seen with Dr. Katrinka Cox who is in agreement with this plan.   2. +FH for CAD  3. RA - on chronic prednisone  4. Fatigue - most likely multifactorial. She has lots of issues going on in her life at this time.   5. Chronic anemia - followed by PCP  Current medicines are reviewed with the patient today.  The patient does not have concerns regarding medicines other than what has been noted above.  The following changes have been made:  See above.  Labs/ tests ordered today include:    Orders Placed This Encounter  Procedures  . Exercise Tolerance Test  . EKG 12-Lead     Disposition:   Further disposition to follow.  Patient is agreeable to this plan and will call if any problems develop in the interim.   Signed: Rosalio Macadamia, RN, ANP-C 10/12/2015 3:20 PM  [facility] [address] [place], [place]  [address] Phone: [phone] Fax: [phone]

## 2015-10-12 NOTE — Patient Instructions (Addendum)
We will be checking the following labs today - NONE   Medication Instructions:    Continue with your current medicines.     Testing/Procedures To Be Arranged:  N/A  Follow-Up:   See [place] back for a GXT - exercise stress test    Other Special Instructions:   N/A    If you need a refill on your cardiac medications before your next appointment, please call your pharmacy.   Call the [facility] office at [phone] if you have any questions, problems or concerns.

## 2015-10-12 NOTE — Telephone Encounter (Signed)
S/w pt to let pt know to come in early.

## 2015-10-16 ENCOUNTER — Ambulatory Visit (INDEPENDENT_AMBULATORY_CARE_PROVIDER_SITE_OTHER): Payer: BC Managed Care – PPO

## 2015-10-16 DIAGNOSIS — R0789 Other chest pain: Secondary | ICD-10-CM

## 2015-10-16 LAB — EXERCISE TOLERANCE TEST
Estimated workload: 8.5 METS
Exercise duration (min): 7 min
Exercise duration (sec): 0 s
MPHR: 163 {beats}/min
Peak HR: 153 {beats}/min
Percent HR: 95 %
Percent of predicted max HR: 93 %
RPE: 16
Rest HR: 88 {beats}/min
Stage 1 DBP: 78 mmHg
Stage 1 Grade: 0 %
Stage 1 HR: 85 {beats}/min
Stage 1 SBP: 109 mmHg
Stage 1 Speed: 0 mph
Stage 2 Grade: 0 %
Stage 2 HR: 92 {beats}/min
Stage 2 Speed: 1 mph
Stage 3 Grade: 0 %
Stage 3 HR: 93 {beats}/min
Stage 3 Speed: 1 mph
Stage 4 DBP: 81 mmHg
Stage 4 Grade: 10 %
Stage 4 HR: 123 {beats}/min
Stage 4 SBP: 149 mmHg
Stage 4 Speed: 1.7 mph
Stage 5 DBP: 79 mmHg
Stage 5 Grade: 12 %
Stage 5 HR: 142 {beats}/min
Stage 5 SBP: 169 mmHg
Stage 5 Speed: 2.5 mph
Stage 6 Grade: 14 %
Stage 6 HR: 153 {beats}/min
Stage 6 Speed: 3.4 mph
Stage 7 DBP: 78 mmHg
Stage 7 Grade: 0 %
Stage 7 HR: 122 {beats}/min
Stage 7 SBP: 158 mmHg
Stage 7 Speed: 0 mph
Stage 8 DBP: 76 mmHg
Stage 8 Grade: 0 %
Stage 8 HR: 98 {beats}/min
Stage 8 SBP: 120 mmHg
Stage 8 Speed: 0 mph

## 2015-12-03 ENCOUNTER — Telehealth: Payer: Self-pay | Admitting: Family Medicine

## 2015-12-03 DIAGNOSIS — M069 Rheumatoid arthritis, unspecified: Secondary | ICD-10-CM

## 2015-12-03 NOTE — Telephone Encounter (Signed)
Last refill on 09/07/2015 and last OV 08/28/2015

## 2015-12-03 NOTE — Telephone Encounter (Signed)
Relation to LO:VFIE Call back number:[phone]   Reason for call: patient requesting a refill HYDROcodone-acetaminophen (NORCO/VICODIN) 5-325 MG tablet

## 2015-12-03 NOTE — Telephone Encounter (Signed)
OK to refill hydrocodone

## 2015-12-04 MED ORDER — HYDROCODONE-ACETAMINOPHEN 5-325 MG PO TABS: 1.0000 | ORAL_TABLET | Freq: Four times a day (QID) | ORAL | Status: DC | PRN

## 2015-12-04 MED FILL — HYDROCODON-APAP 5-325: 5-325 | 10 days supply | Qty: 40 | Fill #0

## 2015-12-04 NOTE — Telephone Encounter (Signed)
Called the patient informed hardcopy is ready for pickup at the front desk. 

## 2016-01-08 ENCOUNTER — Encounter: Payer: Self-pay | Admitting: Family

## 2016-01-08 ENCOUNTER — Ambulatory Visit (INDEPENDENT_AMBULATORY_CARE_PROVIDER_SITE_OTHER): Payer: BC Managed Care – PPO | Admitting: Family

## 2016-01-08 VITALS — BP 102/58 | HR 103 | Temp 97.9°F | Resp 19 | Ht 61.0 in | Wt 100.6 lb

## 2016-01-08 DIAGNOSIS — B349 Viral infection, unspecified: Secondary | ICD-10-CM | POA: Diagnosis not present

## 2016-01-08 DIAGNOSIS — B002 Herpesviral gingivostomatitis and pharyngotonsillitis: Secondary | ICD-10-CM

## 2016-01-08 MED ORDER — BENZONATATE 100 MG PO CAPS: 100.0000 mg | ORAL_CAPSULE | Freq: Three times a day (TID) | ORAL | 0 refills | Status: DC | PRN

## 2016-01-08 MED ORDER — VALACYCLOVIR HCL 1 G PO TABS: ORAL_TABLET | ORAL | 0 refills | Status: DC

## 2016-01-08 NOTE — Patient Instructions (Signed)
For cough- you may use tessalon 3 times daily as needed. For cold sores- begin Valtrex.  Let us know if new/worsening symptoms or if symptoms are not improved by Monday.

## 2016-01-08 NOTE — Progress Notes (Signed)
Pre visit review using our clinic review tool, if applicable. No additional management support is needed unless otherwise documented below in the visit note. 

## 2016-01-08 NOTE — Progress Notes (Signed)
Subjective:    Patient ID: Jamie Cox, female    DOB: [birth year], 55 y.o.   MRN: [id number]  HPI  Jamie Cox is a 55 yr old female who presents today with chief complaint of cough.  She reports that cough began 3 days ago and is associated with rhinorrhea.  Also has some oral cold sores x 3 days. Reports that her cough has worsened. She had tactile temp last night.    Neck Pain- began on 01/02/16. Reports she has a "crook in my neck" on the right side, reports that this is improving.     Review of Systems See HPI  Past Medical History:  Diagnosis Date  . Chronic sinusitis    Followed by Dr. Jenne Pane  . Environmental allergies   . GERD (gastroesophageal reflux disease)   . History of chicken pox   . Hypercalcemia 07/19/2015  . Osteoporosis, unspecified    steroid induced  . Personal history of other diseases of circulatory system   . Rheumatoid arthritis(714.0)   . Unspecified deficiency anemia    microcytic     Social History   Social History  . Marital status: Married    Spouse name: N/A  . Number of children: 2  . Years of education: N/A   Occupational History  . Data processing manager    Social History Main Topics  . Smoking status: Never Smoker  . Smokeless tobacco: Never Used  . Alcohol use No  . Drug use: No  . Sexual activity: Not Currently   Other Topics Concern  . Not on file   Social History Narrative  . No narrative on file    Past Surgical History:  Procedure Laterality Date  . NASAL SINUS SURGERY  06/2012    Family History  Problem Relation Age of Onset  . Liver cancer Maternal Grandmother   . Hypertension Maternal Grandmother   . Heart attack Maternal Grandmother   . Scleroderma Mother   . Heart disease Mother   . Kidney disease Mother   . Prostate cancer Father   . Diabetes Father   . Heart disease Father     CHF  . Arthritis Father     s/p hip replacement  . Lung cancer Paternal Uncle   . Coronary artery disease Paternal  Grandmother   . Hypertension Paternal Grandmother   . Liver cancer Paternal Grandmother   . Colon cancer Neg Hx   . Hyperlipidemia Son   . Alzheimer's disease Paternal Grandfather   . Other Sister     Cardiomegaly  . Arthritis Sister   . Heart disease Sister     rare  . Sleep apnea Brother   . Arthritis Daughter   . Lupus Cousin   . Arthritis Cousin     Allergies  Allergen Reactions  . Avelox [Moxifloxacin Hcl In Nacl] Diarrhea, Nausea And Vomiting and Other (See Comments)    Dizziness, Cold chills   . Bactrim [Sulfamethoxazole-Trimethoprim] Hives    Current Outpatient Prescriptions on File Prior to Visit  Medication Sig Dispense Refill  . acetaminophen (TYLENOL) 650 MG CR tablet Take 650 mg by mouth every 8 (eight) hours as needed. For pain    . albuterol (PROVENTIL HFA;VENTOLIN HFA) 108 (90 BASE) MCG/ACT inhaler Inhale 1-2 puffs into the lungs every 6 (six) hours as needed for wheezing or shortness of breath (cough). For shortness of breath/wheezing 3.7 g 1  . calcium-vitamin D (CALCIUM 500/D) 500-200 MG-UNIT per tablet 1 tablet.    . Ferrous Fumarate-Folic  Acid (HEMOCYTE-F) 324-1 MG TABS Take 1 tablet by mouth daily. 30 each 5  . fluticasone (FLONASE) 50 MCG/ACT nasal spray Place 2 sprays into both nostrils daily. 16 g 3  . HYDROcodone-acetaminophen (NORCO/VICODIN) 5-325 MG tablet Take 1 tablet by mouth every 6 (six) hours as needed for moderate pain. 40 tablet 0  . loratadine (CLARITIN) 10 MG tablet 10 mg.    . meloxicam (MOBIC) 7.5 MG tablet Take 1-2 tablets by mouth daily as needed for pain. 60 tablet 3  . montelukast (SINGULAIR) 10 MG tablet Take 1 tablet (10 mg total) by mouth at bedtime. 30 tablet 5  . Multiple Vitamin (MULTIVITAMIN) capsule Take 1 capsule by mouth daily.      . pantoprazole (PROTONIX) 40 MG tablet Take 1 tablet (40 mg total) by mouth daily. 90 tablet 1  . predniSONE (DELTASONE) 2.5 MG tablet TAKE ONE TABLET BY MOUTH ONCE DAILY WITH BREAKFAST 90 tablet 1   . ranitidine (ZANTAC) 300 MG tablet Take 0.5 tablets (150 mg total) by mouth at bedtime. 90 tablet 1   No current facility-administered medications on file prior to visit.     BP (!) 102/58 (Cuff Size: Normal)   Pulse (!) 103   Temp 97.9 F (36.6 C) (Oral)   Resp 19   Ht 5\' 1"  (1.549 m)   Wt 100 lb 9.6 oz (45.6 kg)   SpO2 94%   BMI 19.01 kg/m       Objective:   Physical Exam  Constitutional: She is oriented to person, place, and time. She appears well-developed and well-nourished.  HENT:  Mouth/Throat: Oropharynx is clear and moist. No oropharyngeal exudate.  Multiple small blisters noted beneath left lower lip. bilateral cerumen impaction  Cardiovascular: Normal rate, regular rhythm and normal heart sounds.   No murmur heard. Pulmonary/Chest: Effort normal and breath sounds normal. No respiratory distress. She has no wheezes.  Musculoskeletal: She exhibits no edema.  Lymphadenopathy:    She has no cervical adenopathy.  Neurological: She is alert and oriented to person, place, and time.  Psychiatric: She has a normal mood and affect. Her behavior is normal. Judgment and thought content normal.          Assessment & Plan:  Acute viral illness- rx with tessalon prn cough. Advised pt to call if new/worsening symptoms or if not feeling better in 2-3 days.  Oral herpes- rx with valtrex.

## 2016-01-21 ENCOUNTER — Other Ambulatory Visit: Payer: Self-pay | Admitting: Family Medicine

## 2016-01-21 DIAGNOSIS — M069 Rheumatoid arthritis, unspecified: Secondary | ICD-10-CM

## 2016-01-21 MED ORDER — HYDROCODONE-ACETAMINOPHEN 5-325 MG PO TABS: 1.0000 | ORAL_TABLET | Freq: Four times a day (QID) | ORAL | 0 refills | Status: DC | PRN

## 2016-01-21 NOTE — Telephone Encounter (Signed)
Requesting:  Hydrocodone Contract   None UDS   None Last OV   08/28/2015 Last Refill   #40 with 0 refills on 12/04/2015  Please Advise

## 2016-01-21 NOTE — Telephone Encounter (Signed)
Relation to OI:BBCW Call back number:[phone]   Reason for call:  Patient requesting a refill HYDROcodone-acetaminophen (NORCO/VICODIN) 5-325 MG tablet

## 2016-01-21 NOTE — Telephone Encounter (Signed)
Patient informed to pickup hardcopy at the front desk. 

## 2016-02-04 ENCOUNTER — Other Ambulatory Visit: Payer: Self-pay | Admitting: Family Medicine

## 2016-03-04 ENCOUNTER — Ambulatory Visit (INDEPENDENT_AMBULATORY_CARE_PROVIDER_SITE_OTHER): Payer: BC Managed Care – PPO | Admitting: Family Medicine

## 2016-03-04 ENCOUNTER — Other Ambulatory Visit (HOSPITAL_COMMUNITY)
Admission: RE | Admit: 2016-03-04 | Discharge: 2016-03-04 | Disposition: A | Discharge status: Home / Self Care | Payer: BC Managed Care – PPO | Source: Ambulatory Visit | Attending: Family Medicine | Admitting: Family Medicine

## 2016-03-04 ENCOUNTER — Encounter: Payer: Self-pay | Admitting: Family Medicine

## 2016-03-04 VITALS — BP 120/72 | HR 96 | Temp 98.0°F | Wt 101.2 lb

## 2016-03-04 DIAGNOSIS — D539 Nutritional anemia, unspecified: Secondary | ICD-10-CM

## 2016-03-04 DIAGNOSIS — N760 Acute vaginitis: Secondary | ICD-10-CM | POA: Insufficient documentation

## 2016-03-04 DIAGNOSIS — Z01411 Encounter for gynecological examination (general) (routine) with abnormal findings: Secondary | ICD-10-CM | POA: Diagnosis present

## 2016-03-04 DIAGNOSIS — Z1151 Encounter for screening for human papillomavirus (HPV): Secondary | ICD-10-CM | POA: Diagnosis not present

## 2016-03-04 DIAGNOSIS — R05 Cough: Secondary | ICD-10-CM | POA: Diagnosis not present

## 2016-03-04 DIAGNOSIS — M069 Rheumatoid arthritis, unspecified: Secondary | ICD-10-CM

## 2016-03-04 DIAGNOSIS — Z124 Encounter for screening for malignant neoplasm of cervix: Secondary | ICD-10-CM

## 2016-03-04 DIAGNOSIS — R030 Elevated blood-pressure reading, without diagnosis of hypertension: Secondary | ICD-10-CM

## 2016-03-04 DIAGNOSIS — Z113 Encounter for screening for infections with a predominantly sexual mode of transmission: Secondary | ICD-10-CM | POA: Insufficient documentation

## 2016-03-04 DIAGNOSIS — R059 Cough, unspecified: Secondary | ICD-10-CM

## 2016-03-04 DIAGNOSIS — Z0000 Encounter for general adult medical examination without abnormal findings: Secondary | ICD-10-CM

## 2016-03-04 DIAGNOSIS — K219 Gastro-esophageal reflux disease without esophagitis: Secondary | ICD-10-CM

## 2016-03-04 DIAGNOSIS — IMO0001 Reserved for inherently not codable concepts without codable children: Secondary | ICD-10-CM

## 2016-03-04 DIAGNOSIS — R000 Tachycardia, unspecified: Secondary | ICD-10-CM

## 2016-03-04 DIAGNOSIS — M810 Age-related osteoporosis without current pathological fracture: Secondary | ICD-10-CM

## 2016-03-04 DIAGNOSIS — I10 Essential (primary) hypertension: Secondary | ICD-10-CM

## 2016-03-04 DIAGNOSIS — R053 Chronic cough: Secondary | ICD-10-CM

## 2016-03-04 MED ORDER — PANTOPRAZOLE SODIUM 40 MG PO TBEC: 40.0000 mg | DELAYED_RELEASE_TABLET | Freq: Every day | ORAL | 1 refills | Status: DC

## 2016-03-04 MED ORDER — MONTELUKAST SODIUM 10 MG PO TABS
10.0000 mg | ORAL_TABLET | Freq: Every day | ORAL | 5 refills | Status: DC
Start: 2016-03-04 — End: 2016-09-27

## 2016-03-04 MED ORDER — MELOXICAM 7.5 MG PO TABS: ORAL_TABLET | ORAL | 3 refills | Status: DC

## 2016-03-04 MED ORDER — HYOSCYAMINE SULFATE 0.125 MG PO TABS: 0.1250 mg | ORAL_TABLET | ORAL | 0 refills | Status: DC | PRN

## 2016-03-04 MED ORDER — METOPROLOL SUCCINATE ER 25 MG PO TB24: 25.0000 mg | ORAL_TABLET | Freq: Every day | ORAL | 3 refills | Status: DC

## 2016-03-04 MED ORDER — PREDNISONE 2.5 MG PO TABS: ORAL_TABLET | ORAL | 1 refills | Status: DC

## 2016-03-04 MED ORDER — FLUTICASONE PROPIONATE 50 MCG/ACT NA SUSP: 2.0000 | Freq: Every day | NASAL | 3 refills | Status: DC

## 2016-03-04 NOTE — Patient Instructions (Signed)
Preventive Care for Adults, Female A healthy lifestyle and preventive care can promote health and wellness. Preventive health guidelines for women include the following key practices.  A routine yearly physical is a good way to check with your health care provider about your health and preventive screening. It is a chance to share any concerns and updates on your health and to receive a thorough exam.  Visit your dentist for a routine exam and preventive care every 6 months. Brush your teeth twice a day and floss once a day. Good oral hygiene prevents tooth decay and gum disease.  The frequency of eye exams is based on your age, health, family medical history, use of contact lenses, and other factors. Follow your health care provider's recommendations for frequency of eye exams.  Eat a healthy diet. Foods like vegetables, fruits, whole grains, low-fat dairy products, and lean protein foods contain the nutrients you need without too many calories. Decrease your intake of foods high in solid fats, added sugars, and salt. Eat the right amount of calories for you.Get information about a proper diet from your health care provider, if necessary.  Regular physical exercise is one of the most important things you can do for your health. Most adults should get at least 150 minutes of moderate-intensity exercise (any activity that increases your heart rate and causes you to sweat) each week. In addition, most adults need muscle-strengthening exercises on 2 or more days a week.  Maintain a healthy weight. The body mass index (BMI) is a screening tool to identify possible weight problems. It provides an estimate of body fat based on height and weight. Your health care provider can find your BMI and can help you achieve or maintain a healthy weight.For adults 20 years and older:  A BMI below 18.5 is considered underweight.  A BMI of 18.5 to 24.9 is normal.  A BMI of 25 to 29.9 is considered overweight.  A  BMI of 30 and above is considered obese.  Maintain normal blood lipids and cholesterol levels by exercising and minimizing your intake of saturated fat. Eat a balanced diet with plenty of fruit and vegetables. Blood tests for lipids and cholesterol should begin at age 45 and be repeated every 5 years. If your lipid or cholesterol levels are high, you are over 50, or you are at high risk for heart disease, you may need your cholesterol levels checked more frequently.Ongoing high lipid and cholesterol levels should be treated with medicines if diet and exercise are not working.  If you smoke, find out from your health care provider how to quit. If you do not use tobacco, do not start.  Lung cancer screening is recommended for adults aged 45-80 years who are at high risk for developing lung cancer because of a history of smoking. A yearly low-dose CT scan of the lungs is recommended for people who have at least a 30-pack-year history of smoking and are a current smoker or have quit within the past 15 years. A pack year of smoking is smoking an average of 1 pack of cigarettes a day for 1 year (for example: 1 pack a day for 30 years or 2 packs a day for 15 years). Yearly screening should continue until the smoker has stopped smoking for at least 15 years. Yearly screening should be stopped for people who develop a health problem that would prevent them from having lung cancer treatment.  If you are pregnant, do not drink alcohol. If you are  breastfeeding, be very cautious about drinking alcohol. If you are not pregnant and choose to drink alcohol, do not have more than 1 drink per day. One drink is considered to be 12 ounces (355 mL) of beer, 5 ounces (148 mL) of wine, or 1.5 ounces (44 mL) of liquor.  Avoid use of street drugs. Do not share needles with anyone. Ask for help if you need support or instructions about stopping the use of drugs.  High blood pressure causes heart disease and increases the risk  of stroke. Your blood pressure should be checked at least every 1 to 2 years. Ongoing high blood pressure should be treated with medicines if weight loss and exercise do not work.  If you are 55-79 years old, ask your health care provider if you should take aspirin to prevent strokes.  Diabetes screening is done by taking a blood sample to check your blood glucose level after you have not eaten for a certain period of time (fasting). If you are not overweight and you do not have risk factors for diabetes, you should be screened once every 3 years starting at age 45. If you are overweight or obese and you are 40-70 years of age, you should be screened for diabetes every year as part of your cardiovascular risk assessment.  Breast cancer screening is essential preventive care for women. You should practice "breast self-awareness." This means understanding the normal appearance and feel of your breasts and may include breast self-examination. Any changes detected, no matter how small, should be reported to a health care provider. Women in their 20s and 30s should have a clinical breast exam (CBE) by a health care provider as part of a regular health exam every 1 to 3 years. After age 40, women should have a CBE every year. Starting at age 40, women should consider having a mammogram (breast X-ray test) every year. Women who have a family history of breast cancer should talk to their health care provider about genetic screening. Women at a high risk of breast cancer should talk to their health care providers about having an MRI and a mammogram every year.  Breast cancer gene (BRCA)-related cancer risk assessment is recommended for women who have family members with BRCA-related cancers. BRCA-related cancers include breast, ovarian, tubal, and peritoneal cancers. Having family members with these cancers may be associated with an increased risk for harmful changes (mutations) in the breast cancer genes BRCA1 and  BRCA2. Results of the assessment will determine the need for genetic counseling and BRCA1 and BRCA2 testing.  Your health care provider may recommend that you be screened regularly for cancer of the pelvic organs (ovaries, uterus, and vagina). This screening involves a pelvic examination, including checking for microscopic changes to the surface of your cervix (Pap test). You may be encouraged to have this screening done every 3 years, beginning at age 21.  For women ages 30-65, health care providers may recommend pelvic exams and Pap testing every 3 years, or they may recommend the Pap and pelvic exam, combined with testing for human papilloma virus (HPV), every 5 years. Some types of HPV increase your risk of cervical cancer. Testing for HPV may also be done on women of any age with unclear Pap test results.  Other health care providers may not recommend any screening for nonpregnant women who are considered low risk for pelvic cancer and who do not have symptoms. Ask your health care provider if a screening pelvic exam is right for   you.  If you have had past treatment for cervical cancer or a condition that could lead to cancer, you need Pap tests and screening for cancer for at least 20 years after your treatment. If Pap tests have been discontinued, your risk factors (such as having a new sexual partner) need to be reassessed to determine if screening should resume. Some women have medical problems that increase the chance of getting cervical cancer. In these cases, your health care provider may recommend more frequent screening and Pap tests.  Colorectal cancer can be detected and often prevented. Most routine colorectal cancer screening begins at the age of 50 years and continues through age 75 years. However, your health care provider may recommend screening at an earlier age if you have risk factors for colon cancer. On a yearly basis, your health care provider may provide home test kits to check  for hidden blood in the stool. Use of a small camera at the end of a tube, to directly examine the colon (sigmoidoscopy or colonoscopy), can detect the earliest forms of colorectal cancer. Talk to your health care provider about this at age 50, when routine screening begins. Direct exam of the colon should be repeated every 5-10 years through age 75 years, unless early forms of precancerous polyps or small growths are found.  People who are at an increased risk for hepatitis B should be screened for this virus. You are considered at high risk for hepatitis B if:  You were born in a country where hepatitis B occurs often. Talk with your health care provider about which countries are considered high risk.  Your parents were born in a high-risk country and you have not received a shot to protect against hepatitis B (hepatitis B vaccine).  You have HIV or AIDS.  You use needles to inject street drugs.  You live with, or have sex with, someone who has hepatitis B.  You get hemodialysis treatment.  You take certain medicines for conditions like cancer, organ transplantation, and autoimmune conditions.  Hepatitis C blood testing is recommended for all people born from 1945 through 1965 and any individual with known risks for hepatitis C.  Practice safe sex. Use condoms and avoid high-risk sexual practices to reduce the spread of sexually transmitted infections (STIs). STIs include gonorrhea, chlamydia, syphilis, trichomonas, herpes, HPV, and human immunodeficiency virus (HIV). Herpes, HIV, and HPV are viral illnesses that have no cure. They can result in disability, cancer, and death.  You should be screened for sexually transmitted illnesses (STIs) including gonorrhea and chlamydia if:  You are sexually active and are younger than 24 years.  You are older than 24 years and your health care provider tells you that you are at risk for this type of infection.  Your sexual activity has changed  since you were last screened and you are at an increased risk for chlamydia or gonorrhea. Ask your health care provider if you are at risk.  If you are at risk of being infected with HIV, it is recommended that you take a prescription medicine daily to prevent HIV infection. This is called preexposure prophylaxis (PrEP). You are considered at risk if:  You are sexually active and do not regularly use condoms or know the HIV status of your partner(s).  You take drugs by injection.  You are sexually active with a partner who has HIV.  Talk with your health care provider about whether you are at high risk of being infected with HIV. If   you choose to begin PrEP, you should first be tested for HIV. You should then be tested every 3 months for as long as you are taking PrEP.  Osteoporosis is a disease in which the bones lose minerals and strength with aging. This can result in serious bone fractures or breaks. The risk of osteoporosis can be identified using a bone density scan. Women ages 67 years and over and women at risk for fractures or osteoporosis should discuss screening with their health care providers. Ask your health care provider whether you should take a calcium supplement or vitamin D to reduce the rate of osteoporosis.  Menopause can be associated with physical symptoms and risks. Hormone replacement therapy is available to decrease symptoms and risks. You should talk to your health care provider about whether hormone replacement therapy is right for you.  Use sunscreen. Apply sunscreen liberally and repeatedly throughout the day. You should seek shade when your shadow is shorter than you. Protect yourself by wearing long sleeves, pants, a wide-brimmed hat, and sunglasses year round, whenever you are outdoors.  Once a month, do a whole body skin exam, using a mirror to look at the skin on your back. Tell your health care provider of new moles, moles that have irregular borders, moles that  are larger than a pencil eraser, or moles that have changed in shape or color.  Stay current with required vaccines (immunizations).  Influenza vaccine. All adults should be immunized every year.  Tetanus, diphtheria, and acellular pertussis (Td, Tdap) vaccine. Pregnant women should receive 1 dose of Tdap vaccine during each pregnancy. The dose should be obtained regardless of the length of time since the last dose. Immunization is preferred during the 27th-36th week of gestation. An adult who has not previously received Tdap or who does not know her vaccine status should receive 1 dose of Tdap. This initial dose should be followed by tetanus and diphtheria toxoids (Td) booster doses every 10 years. Adults with an unknown or incomplete history of completing a 3-dose immunization series with Td-containing vaccines should begin or complete a primary immunization series including a Tdap dose. Adults should receive a Td booster every 10 years.  Varicella vaccine. An adult without evidence of immunity to varicella should receive 2 doses or a second dose if she has previously received 1 dose. Pregnant females who do not have evidence of immunity should receive the first dose after pregnancy. This first dose should be obtained before leaving the health care facility. The second dose should be obtained 4-8 weeks after the first dose.  Human papillomavirus (HPV) vaccine. Females aged 13-26 years who have not received the vaccine previously should obtain the 3-dose series. The vaccine is not recommended for use in pregnant females. However, pregnancy testing is not needed before receiving a dose. If a female is found to be pregnant after receiving a dose, no treatment is needed. In that case, the remaining doses should be delayed until after the pregnancy. Immunization is recommended for any person with an immunocompromised condition through the age of 61 years if she did not get any or all doses earlier. During the  3-dose series, the second dose should be obtained 4-8 weeks after the first dose. The third dose should be obtained 24 weeks after the first dose and 16 weeks after the second dose.  Zoster vaccine. One dose is recommended for adults aged 30 years or older unless certain conditions are present.  Measles, mumps, and rubella (MMR) vaccine. Adults born  before 1957 generally are considered immune to measles and mumps. Adults born in 1957 or later should have 1 or more doses of MMR vaccine unless there is a contraindication to the vaccine or there is laboratory evidence of immunity to each of the three diseases. A routine second dose of MMR vaccine should be obtained at least 28 days after the first dose for students attending postsecondary schools, health care workers, or international travelers. People who received inactivated measles vaccine or an unknown type of measles vaccine during 1963-1967 should receive 2 doses of MMR vaccine. People who received inactivated mumps vaccine or an unknown type of mumps vaccine before 1979 and are at high risk for mumps infection should consider immunization with 2 doses of MMR vaccine. For females of childbearing age, rubella immunity should be determined. If there is no evidence of immunity, females who are not pregnant should be vaccinated. If there is no evidence of immunity, females who are pregnant should delay immunization until after pregnancy. Unvaccinated health care workers born before 1957 who lack laboratory evidence of measles, mumps, or rubella immunity or laboratory confirmation of disease should consider measles and mumps immunization with 2 doses of MMR vaccine or rubella immunization with 1 dose of MMR vaccine.  Pneumococcal 13-valent conjugate (PCV13) vaccine. When indicated, a person who is uncertain of his immunization history and has no record of immunization should receive the PCV13 vaccine. All adults 65 years of age and older should receive this  vaccine. An adult aged 19 years or older who has certain medical conditions and has not been previously immunized should receive 1 dose of PCV13 vaccine. This PCV13 should be followed with a dose of pneumococcal polysaccharide (PPSV23) vaccine. Adults who are at high risk for pneumococcal disease should obtain the PPSV23 vaccine at least 8 weeks after the dose of PCV13 vaccine. Adults older than 57 years of age who have normal immune system function should obtain the PPSV23 vaccine dose at least 1 year after the dose of PCV13 vaccine.  Pneumococcal polysaccharide (PPSV23) vaccine. When PCV13 is also indicated, PCV13 should be obtained first. All adults aged 65 years and older should be immunized. An adult younger than age 65 years who has certain medical conditions should be immunized. Any person who resides in a nursing home or long-term care facility should be immunized. An adult smoker should be immunized. People with an immunocompromised condition and certain other conditions should receive both PCV13 and PPSV23 vaccines. People with human immunodeficiency virus (HIV) infection should be immunized as soon as possible after diagnosis. Immunization during chemotherapy or radiation therapy should be avoided. Routine use of PPSV23 vaccine is not recommended for American Indians, [place] Natives, or people younger than 65 years unless there are medical conditions that require PPSV23 vaccine. When indicated, people who have unknown immunization and have no record of immunization should receive PPSV23 vaccine. One-time revaccination 5 years after the first dose of PPSV23 is recommended for people aged 19-64 years who have chronic kidney failure, nephrotic syndrome, asplenia, or immunocompromised conditions. People who received 1-2 doses of PPSV23 before age 65 years should receive another dose of PPSV23 vaccine at age 65 years or later if at least 5 years have passed since the previous dose. Doses of PPSV23 are not  needed for people immunized with PPSV23 at or after age 65 years.  Meningococcal vaccine. Adults with asplenia or persistent complement component deficiencies should receive 2 doses of quadrivalent meningococcal conjugate (MenACWY-D) vaccine. The doses should be obtained   at least 2 months apart. Microbiologists working with certain meningococcal bacteria, [place] recruits, people at risk during an outbreak, and people who travel to or live in countries with a high rate of meningitis should be immunized. A first-year college student up through age 34 years who is living in a residence hall should receive a dose if she did not receive a dose on or after her 16th birthday. Adults who have certain high-risk conditions should receive one or more doses of vaccine.  Hepatitis A vaccine. Adults who wish to be protected from this disease, have certain high-risk conditions, work with hepatitis A-infected animals, work in hepatitis A research labs, or travel to or work in countries with a high rate of hepatitis A should be immunized. Adults who were previously unvaccinated and who anticipate close contact with an international adoptee during the first 60 days after arrival in the [place] from a country with a high rate of hepatitis A should be immunized.  Hepatitis B vaccine. Adults who wish to be protected from this disease, have certain high-risk conditions, may be exposed to blood or other infectious body fluids, are household contacts or sex partners of hepatitis B positive people, are clients or workers in certain care facilities, or travel to or work in countries with a high rate of hepatitis B should be immunized.  Haemophilus influenzae type b (Hib) vaccine. A previously unvaccinated person with asplenia or sickle cell disease or having a scheduled splenectomy should receive 1 dose of Hib vaccine. Regardless of previous immunization, a recipient of a hematopoietic stem cell transplant should receive a  3-dose series 6-12 months after her successful transplant. Hib vaccine is not recommended for adults with HIV infection. Preventive Services / Frequency Ages 35 to 4 years  Blood pressure check.** / Every 3-5 years.  Lipid and cholesterol check.** / Every 5 years beginning at age 60.  Clinical breast exam.** / Every 3 years for women in their 71s and 10s.  BRCA-related cancer risk assessment.** / For women who have family members with a BRCA-related cancer (breast, ovarian, tubal, or peritoneal cancers).  Pap test.** / Every 2 years from ages 76 through 26. Every 3 years starting at age 61 through age 76 or 93 with a history of 3 consecutive normal Pap tests.  HPV screening.** / Every 3 years from ages 37 through ages 60 to 51 with a history of 3 consecutive normal Pap tests.  Hepatitis C blood test.** / For any individual with known risks for hepatitis C.  Skin self-exam. / Monthly.  Influenza vaccine. / Every year.  Tetanus, diphtheria, and acellular pertussis (Tdap, Td) vaccine.** / Consult your health care provider. Pregnant women should receive 1 dose of Tdap vaccine during each pregnancy. 1 dose of Td every 10 years.  Varicella vaccine.** / Consult your health care provider. Pregnant females who do not have evidence of immunity should receive the first dose after pregnancy.  HPV vaccine. / 3 doses over 6 months, if 93 and younger. The vaccine is not recommended for use in pregnant females. However, pregnancy testing is not needed before receiving a dose.  Measles, mumps, rubella (MMR) vaccine.** / You need at least 1 dose of MMR if you were born in 1957 or later. You may also need a 2nd dose. For females of childbearing age, rubella immunity should be determined. If there is no evidence of immunity, females who are not pregnant should be vaccinated. If there is no evidence of immunity, females who are  pregnant should delay immunization until after pregnancy.  Pneumococcal  13-valent conjugate (PCV13) vaccine.** / Consult your health care provider.  Pneumococcal polysaccharide (PPSV23) vaccine.** / 1 to 2 doses if you smoke cigarettes or if you have certain conditions.  Meningococcal vaccine.** / 1 dose if you are age 68 to 8 years and a Market researcher living in a residence hall, or have one of several medical conditions, you need to get vaccinated against meningococcal disease. You may also need additional booster doses.  Hepatitis A vaccine.** / Consult your health care provider.  Hepatitis B vaccine.** / Consult your health care provider.  Haemophilus influenzae type b (Hib) vaccine.** / Consult your health care provider. Ages 7 to 53 years  Blood pressure check.** / Every year.  Lipid and cholesterol check.** / Every 5 years beginning at age 25 years.  Lung cancer screening. / Every year if you are aged 11-80 years and have a 30-pack-year history of smoking and currently smoke or have quit within the past 15 years. Yearly screening is stopped once you have quit smoking for at least 15 years or develop a health problem that would prevent you from having lung cancer treatment.  Clinical breast exam.** / Every year after age 48 years.  BRCA-related cancer risk assessment.** / For women who have family members with a BRCA-related cancer (breast, ovarian, tubal, or peritoneal cancers).  Mammogram.** / Every year beginning at age 41 years and continuing for as long as you are in good health. Consult with your health care provider.  Pap test.** / Every 3 years starting at age 65 years through age 37 or 70 years with a history of 3 consecutive normal Pap tests.  HPV screening.** / Every 3 years from ages 72 years through ages 60 to 40 years with a history of 3 consecutive normal Pap tests.  Fecal occult blood test (FOBT) of stool. / Every year beginning at age 21 years and continuing until age 5 years. You may not need to do this test if you get  a colonoscopy every 10 years.  Flexible sigmoidoscopy or colonoscopy.** / Every 5 years for a flexible sigmoidoscopy or every 10 years for a colonoscopy beginning at age 35 years and continuing until age 48 years.  Hepatitis C blood test.** / For all people born from [birth year] through 1965 and any individual with known risks for hepatitis C.  Skin self-exam. / Monthly.  Influenza vaccine. / Every year.  Tetanus, diphtheria, and acellular pertussis (Tdap/Td) vaccine.** / Consult your health care provider. Pregnant women should receive 1 dose of Tdap vaccine during each pregnancy. 1 dose of Td every 10 years.  Varicella vaccine.** / Consult your health care provider. Pregnant females who do not have evidence of immunity should receive the first dose after pregnancy.  Zoster vaccine.** / 1 dose for adults aged 30 years or older.  Measles, mumps, rubella (MMR) vaccine.** / You need at least 1 dose of MMR if you were born in 1957 or later. You may also need a second dose. For females of childbearing age, rubella immunity should be determined. If there is no evidence of immunity, females who are not pregnant should be vaccinated. If there is no evidence of immunity, females who are pregnant should delay immunization until after pregnancy.  Pneumococcal 13-valent conjugate (PCV13) vaccine.** / Consult your health care provider.  Pneumococcal polysaccharide (PPSV23) vaccine.** / 1 to 2 doses if you smoke cigarettes or if you have certain conditions.  Meningococcal vaccine.** /  Consult your health care provider.  Hepatitis A vaccine.** / Consult your health care provider.  Hepatitis B vaccine.** / Consult your health care provider.  Haemophilus influenzae type b (Hib) vaccine.** / Consult your health care provider. Ages 64 years and over  Blood pressure check.** / Every year.  Lipid and cholesterol check.** / Every 5 years beginning at age 23 years.  Lung cancer screening. / Every year if you  are aged 16-80 years and have a 30-pack-year history of smoking and currently smoke or have quit within the past 15 years. Yearly screening is stopped once you have quit smoking for at least 15 years or develop a health problem that would prevent you from having lung cancer treatment.  Clinical breast exam.** / Every year after age 74 years.  BRCA-related cancer risk assessment.** / For women who have family members with a BRCA-related cancer (breast, ovarian, tubal, or peritoneal cancers).  Mammogram.** / Every year beginning at age 44 years and continuing for as long as you are in good health. Consult with your health care provider.  Pap test.** / Every 3 years starting at age 58 years through age 22 or 39 years with 3 consecutive normal Pap tests. Testing can be stopped between 65 and 70 years with 3 consecutive normal Pap tests and no abnormal Pap or HPV tests in the past 10 years.  HPV screening.** / Every 3 years from ages 64 years through ages 70 or 61 years with a history of 3 consecutive normal Pap tests. Testing can be stopped between 65 and 70 years with 3 consecutive normal Pap tests and no abnormal Pap or HPV tests in the past 10 years.  Fecal occult blood test (FOBT) of stool. / Every year beginning at age 40 years and continuing until age 27 years. You may not need to do this test if you get a colonoscopy every 10 years.  Flexible sigmoidoscopy or colonoscopy.** / Every 5 years for a flexible sigmoidoscopy or every 10 years for a colonoscopy beginning at age 7 years and continuing until age 32 years.  Hepatitis C blood test.** / For all people born from [birth year] through 1965 and any individual with known risks for hepatitis C.  Osteoporosis screening.** / A one-time screening for women ages 30 years and over and women at risk for fractures or osteoporosis.  Skin self-exam. / Monthly.  Influenza vaccine. / Every year.  Tetanus, diphtheria, and acellular pertussis (Tdap/Td)  vaccine.** / 1 dose of Td every 10 years.  Varicella vaccine.** / Consult your health care provider.  Zoster vaccine.** / 1 dose for adults aged 35 years or older.  Pneumococcal 13-valent conjugate (PCV13) vaccine.** / Consult your health care provider.  Pneumococcal polysaccharide (PPSV23) vaccine.** / 1 dose for all adults aged 46 years and older.  Meningococcal vaccine.** / Consult your health care provider.  Hepatitis A vaccine.** / Consult your health care provider.  Hepatitis B vaccine.** / Consult your health care provider.  Haemophilus influenzae type b (Hib) vaccine.** / Consult your health care provider. ** Family history and personal history of risk and conditions may change your health care provider's recommendations.   This information is not intended to replace advice given to you by your health care provider. Make sure you discuss any questions you have with your health care provider.   Document Released: 07/19/2001 Document Revised: 06/13/2014 Document Reviewed: 10/18/2010 Elsevier Interactive Patient Education [employer].

## 2016-03-04 NOTE — Assessment & Plan Note (Deleted)
Dating back to prior to 2010, had a nasal sinus surgery at that time and it improved her cough now it is worsening again. Declines referral to ENT at this time wants to resstart Neti pot which seemed to help in past.  

## 2016-03-04 NOTE — Assessment & Plan Note (Signed)
Dating back to prior to 2010, had a nasal sinus surgery at that time and it improved her cough now it is worsening again. Declines referral to ENT at this time wants to resstart Neti pot which seemed to help in past.

## 2016-03-04 NOTE — Assessment & Plan Note (Signed)
Pap today, no concerns on exam.  

## 2016-03-04 NOTE — Assessment & Plan Note (Signed)
Improved on recheck, started on Metoprolol XL 25 mg po daily recheck in 1 month

## 2016-03-04 NOTE — Assessment & Plan Note (Signed)
Increase leafy greens, consider increased lean red meat and using cast iron cookware. Continue to monitor, report any concerns 

## 2016-03-04 NOTE — Assessment & Plan Note (Signed)
Encouraged to get adequate exercise, calcium and vitamin d intake 

## 2016-03-04 NOTE — Assessment & Plan Note (Signed)
Patient encouraged to maintain heart healthy diet, regular exercise, adequate sleep. Consider daily probiotics. Take medications as prescribed 

## 2016-03-04 NOTE — Progress Notes (Signed)
Pre visit review using our clinic review tool, if applicable. No additional management support is needed unless otherwise documented below in the visit note. 

## 2016-03-05 NOTE — Progress Notes (Signed)
Patient ID: Jamie Cox, female   DOB: November 14, 1960, 55 y.o.   MRN: [id number]   Subjective:    Patient ID: Jamie Cox, female    DOB: 1960/10/21, 55 y.o.   MRN: [id number]  Chief Complaint  Patient presents with  . Annual Exam    with pap    HPI Patient is in today for annual preventative exam and follow up with chronic medical conditions. She has had a cough for many years. Back in 2010 she had a nasal sugery and the cough improved but the cough has returned in the past year. She previously has used a saline nasal flushes with good results. Now she is having post nasal drip which leads to her cough. No fevers, chills. No recent other physical concerns. Unfortunately her mother has recently died and her father is ill with stage 3 cancer. 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 Denies CP/palp/SOB/HA/fevers/GI or GU c/o. Taking meds as prescribed  Past Medical History:  Diagnosis Date  . Chronic sinusitis    Followed by Dr. Redmond Baseman  . Environmental allergies   . GERD (gastroesophageal reflux disease)   . History of chicken pox   . Hypercalcemia 07/19/2015  . Osteoporosis, unspecified    steroid induced  . Personal history of other diseases of circulatory system   . Rheumatoid arthritis(714.0)   . Unspecified deficiency anemia    microcytic    Past Surgical History:  Procedure Laterality Date  . NASAL SINUS SURGERY  06/2012    Family History  Problem Relation Age of Onset  . Liver cancer Maternal Grandmother   . Hypertension Maternal Grandmother   . Heart attack Maternal Grandmother   . Scleroderma Mother   . Heart disease Mother   . Kidney disease Mother   . Prostate cancer Father   . Diabetes Father   . Heart disease Father     CHF  . Arthritis Father     s/p hip replacement  . Cancer Father     lung cancer with mets, smoker  . Lung cancer Paternal Uncle   . Coronary artery disease Paternal Grandmother   . Hypertension Paternal  Grandmother   . Liver cancer Paternal Grandmother   . Hyperlipidemia Son   . Alzheimer's disease Paternal Grandfather   . Other Sister     Cardiomegaly  . Arthritis Sister   . Heart disease Sister     rare  . Sleep apnea Brother   . Arthritis Daughter   . Lupus Cousin   . Arthritis Cousin   . Colon cancer Neg Hx     Social History   Social History  . Marital status: Married    Spouse name: N/A  . Number of children: 2  . Years of education: N/A   Occupational History  . Building control surveyor    Social History Main Topics  . Smoking status: Never Smoker  . Smokeless tobacco: Never Used  . Alcohol use No  . Drug use: No  . Sexual activity: Not Currently   Other Topics Concern  . Not on file   Social History Narrative  . No narrative on file    Outpatient Medications Prior to Visit  Medication Sig Dispense Refill  . acetaminophen (TYLENOL) 650 MG CR tablet Take 650 mg by mouth every 8 (eight) hours as needed. For pain    . albuterol (PROVENTIL HFA;VENTOLIN HFA) 108 (90 BASE) MCG/ACT inhaler Inhale 1-2 puffs into the lungs every 6 (six)  hours as needed for wheezing or shortness of breath (cough). For shortness of breath/wheezing 3.7 g 1  . benzonatate (TESSALON) 100 MG capsule Take 1 capsule (100 mg total) by mouth 3 (three) times daily as needed. 20 capsule 0  . calcium-vitamin D (CALCIUM 500/D) 500-200 MG-UNIT per tablet 1 tablet.    . Ferrous Fumarate-Folic Acid (HEMOCYTE-F) 324-1 MG TABS Take 1 tablet by mouth daily. 30 each 5  . HYDROcodone-acetaminophen (NORCO/VICODIN) 5-325 MG tablet Take 1 tablet by mouth every 6 (six) hours as needed for moderate pain. 40 tablet 0  . loratadine (CLARITIN) 10 MG tablet 10 mg.    . Multiple Vitamin (MULTIVITAMIN) capsule Take 1 capsule by mouth daily.      . ranitidine (ZANTAC) 300 MG tablet Take 0.5 tablets (150 mg total) by mouth at bedtime. 90 tablet 1  . valACYclovir (VALTREX) 1000 MG tablet Take 2 tabs now and 2 tabs in 12  hours for cold sore. 4 tablet 0  . fluticasone (FLONASE) 50 MCG/ACT nasal spray Place 2 sprays into both nostrils daily. 16 g 3  . meloxicam (MOBIC) 7.5 MG tablet Take 1-2 tablets by mouth daily as needed for pain. 60 tablet 3  . montelukast (SINGULAIR) 10 MG tablet Take 1 tablet (10 mg total) by mouth at bedtime. 30 tablet 5  . pantoprazole (PROTONIX) 40 MG tablet Take 1 tablet (40 mg total) by mouth daily. 90 tablet 1  . predniSONE (DELTASONE) 2.5 MG tablet TAKE ONE TABLET BY MOUTH ONCE DAILY WITH BREAKFAST 90 tablet 1   No facility-administered medications prior to visit.     Allergies  Allergen Reactions  . Avelox [Moxifloxacin Hcl In Nacl] Diarrhea, Nausea And Vomiting and Other (See Comments)    Dizziness, Cold chills   . Bactrim [Sulfamethoxazole-Trimethoprim] Hives    Review of Systems  Constitutional: Negative for chills, fever and malaise/fatigue.  HENT: Positive for congestion. Negative for hearing loss.   Eyes: Negative for discharge.  Respiratory: Positive for cough. Negative for sputum production and shortness of breath.   Cardiovascular: Negative for chest pain, palpitations and leg swelling.  Gastrointestinal: Negative for abdominal pain, blood in stool, constipation, diarrhea, heartburn, nausea and vomiting.  Genitourinary: Negative for dysuria, frequency, hematuria and urgency.  Musculoskeletal: Negative for back pain, falls and myalgias.  Skin: Negative for rash.  Neurological: Negative for dizziness, sensory change, loss of consciousness, weakness and headaches.  Endo/Heme/Allergies: Negative for environmental allergies. Does not bruise/bleed easily.  Psychiatric/Behavioral: Negative for depression and suicidal ideas. The patient is not nervous/anxious and does not have insomnia.        Objective:    Physical Exam  Constitutional: She is oriented to person, place, and time. She appears well-developed and well-nourished. No distress.  HENT:  Head:  Normocephalic and atraumatic.  Eyes: Conjunctivae are normal.  Neck: Neck supple. No thyromegaly present.  Cardiovascular: Normal rate, regular rhythm and normal heart sounds.   No murmur heard. Pulmonary/Chest: Effort normal and breath sounds normal. No respiratory distress.  Abdominal: Soft. Bowel sounds are normal. She exhibits no distension and no mass. There is no tenderness.  Musculoskeletal: She exhibits no edema.  Lymphadenopathy:    She has no cervical adenopathy.  Neurological: She is alert and oriented to person, place, and time.  Skin: Skin is warm and dry.  Psychiatric: She has a normal mood and affect. Her behavior is normal.    BP 120/72 (BP Location: Left Arm, Patient Position: Sitting, Cuff Size: Normal)   Pulse 96  Temp 98 F (36.7 C) (Oral)   Wt 101 lb 3.2 oz (45.9 kg)   SpO2 94%   BMI 19.12 kg/m  Wt Readings from Last 3 Encounters:  03/04/16 101 lb 3.2 oz (45.9 kg)  01/08/16 100 lb 9.6 oz (45.6 kg)  10/12/15 101 lb (45.8 kg)     Lab Results  Component Value Date   WBC 5.2 07/13/2015   HGB 10.9 (L) 07/13/2015   HCT 35.5 (L) 07/13/2015   PLT 235.0 07/13/2015   GLUCOSE 74 07/13/2015   CHOL 190 07/13/2015   TRIG 111.0 07/13/2015   HDL 73.90 07/13/2015   LDLCALC 94 07/13/2015   ALT 14 07/13/2015   AST 20 07/13/2015   NA 140 07/13/2015   K 3.7 07/13/2015   CL 104 07/13/2015   CREATININE 0.65 07/13/2015   BUN 12 07/13/2015   CO2 31 07/13/2015   TSH 0.49 07/13/2015    Lab Results  Component Value Date   TSH 0.49 07/13/2015   Lab Results  Component Value Date   WBC 5.2 07/13/2015   HGB 10.9 (L) 07/13/2015   HCT 35.5 (L) 07/13/2015   MCV 74.5 (L) 07/13/2015   PLT 235.0 07/13/2015   Lab Results  Component Value Date   NA 140 07/13/2015   K 3.7 07/13/2015   CO2 31 07/13/2015   GLUCOSE 74 07/13/2015   BUN 12 07/13/2015   CREATININE 0.65 07/13/2015   BILITOT 0.2 07/13/2015   ALKPHOS 53 07/13/2015   AST 20 07/13/2015   ALT 14 07/13/2015    PROT 8.6 (H) 07/13/2015   ALBUMIN 4.3 07/13/2015   CALCIUM 9.4 08/28/2015   GFR 120.90 07/13/2015   Lab Results  Component Value Date   CHOL 190 07/13/2015   Lab Results  Component Value Date   HDL 73.90 07/13/2015   Lab Results  Component Value Date   LDLCALC 94 07/13/2015   Lab Results  Component Value Date   TRIG 111.0 07/13/2015   Lab Results  Component Value Date   CHOLHDL 3 07/13/2015   No results found for: HGBA1C     Assessment & Plan:   Problem List Items Addressed This Visit    Deficiency anemia    Increase leafy greens, consider increased lean red meat and using cast iron cookware. Continue to monitor, report any concerns      Relevant Orders   CBC   Rheumatoid arthritis (HCC)    On MTX      Relevant Medications   predniSONE (DELTASONE) 2.5 MG tablet   meloxicam (MOBIC) 7.5 MG tablet   Osteoporosis    Encouraged to get adequate exercise, calcium and vitamin d intake      Chronic cough    Dating back to prior to 2010, had a nasal sinus surgery at that time and it improved her cough now it is worsening again. Declines referral to ENT at this time wants to resstart Neti pot which seemed to help in past.       Relevant Medications   fluticasone (FLONASE) 50 MCG/ACT nasal spray   Elevated blood pressure    Well controlled meds. Encouraged heart healthy diet such as the DASH diet and exercise as tolerated.       Tachycardia    Improved on recheck, started on Metoprolol XL 25 mg po daily recheck in 1 month      GERD (gastroesophageal reflux disease)   Relevant Medications   pantoprazole (PROTONIX) 40 MG tablet   hyoscyamine (LEVSIN, ANASPAZ) 0.125 MG  tablet   Preventative health care    Patient encouraged to maintain heart healthy diet, regular exercise, adequate sleep. Consider daily probiotics. Take medications as prescribe.d      Relevant Orders   TSH   Comp Met (CMET)   Cervical cancer screening    Pap today, no concerns on exam.        Relevant Orders   Cytology - PAP   RESOLVED: Cough    Other Visit Diagnoses   None.     I am having Ms. Piech start on hyoscyamine and metoprolol succinate. I am also having her maintain her acetaminophen, multivitamin, loratadine, calcium-vitamin D, albuterol, ranitidine, Ferrous Fumarate-Folic Acid, benzonatate, valACYclovir, HYDROcodone-acetaminophen, predniSONE, montelukast, fluticasone, meloxicam, and pantoprazole.  Meds ordered this encounter  Medications  . predniSONE (DELTASONE) 2.5 MG tablet    Sig: TAKE ONE TABLET BY MOUTH ONCE DAILY WITH BREAKFAST    Dispense:  90 tablet    Refill:  1  . montelukast (SINGULAIR) 10 MG tablet    Sig: Take 1 tablet (10 mg total) by mouth at bedtime.    Dispense:  30 tablet    Refill:  5  . fluticasone (FLONASE) 50 MCG/ACT nasal spray    Sig: Place 2 sprays into both nostrils daily.    Dispense:  16 g    Refill:  3  . meloxicam (MOBIC) 7.5 MG tablet    Sig: Take 1-2 tablets by mouth daily as needed for pain.    Dispense:  60 tablet    Refill:  3  . pantoprazole (PROTONIX) 40 MG tablet    Sig: Take 1 tablet (40 mg total) by mouth daily.    Dispense:  90 tablet    Refill:  1  . hyoscyamine (LEVSIN, ANASPAZ) 0.125 MG tablet    Sig: Take 1 tablet (0.125 mg total) by mouth every 4 (four) hours as needed.    Dispense:  30 tablet    Refill:  0  . metoprolol succinate (TOPROL-XL) 25 MG 24 hr tablet    Sig: Take 1 tablet (25 mg total) by mouth daily.    Dispense:  30 tablet    Refill:  3     Penni Homans, MD

## 2016-03-05 NOTE — Assessment & Plan Note (Signed)
On MTX 

## 2016-03-05 NOTE — Assessment & Plan Note (Signed)
Well controlled meds. Encouraged heart healthy diet such as the DASH diet and exercise as tolerated.

## 2016-03-07 LAB — CYTOLOGY - PAP

## 2016-03-09 LAB — CERVICOVAGINAL ANCILLARY ONLY
BACTERIAL VAGINITIS: NEGATIVE
Candida vaginitis: NEGATIVE
Herpes: NEGATIVE

## 2016-03-31 ENCOUNTER — Ambulatory Visit (INDEPENDENT_AMBULATORY_CARE_PROVIDER_SITE_OTHER): Payer: BC Managed Care – PPO | Admitting: Family Medicine

## 2016-03-31 VITALS — BP 121/81 | HR 82

## 2016-03-31 DIAGNOSIS — R030 Elevated blood-pressure reading, without diagnosis of hypertension: Secondary | ICD-10-CM | POA: Diagnosis not present

## 2016-03-31 DIAGNOSIS — R000 Tachycardia, unspecified: Secondary | ICD-10-CM | POA: Diagnosis not present

## 2016-03-31 DIAGNOSIS — M069 Rheumatoid arthritis, unspecified: Secondary | ICD-10-CM

## 2016-03-31 MED ORDER — HYDROCODONE-ACETAMINOPHEN 5-325 MG PO TABS: 1.0000 | ORAL_TABLET | Freq: Four times a day (QID) | ORAL | 0 refills | Status: DC | PRN

## 2016-03-31 NOTE — Progress Notes (Signed)
Pre visit review using our clinic review tool, if applicable. No additional management support is needed unless otherwise documented below in the visit note.  Patient presents in office for blood pressure check per OV note 03/04/16. Reviewed medication & regimen with the patient. Today's reading was as follow: BP 121/81 P 82.  Per Dr. Abner Greenspan: Continue current medication & regimen. Keep follow-up appointment with PCP on 06/09/16 at 10:15 AM.  Informed patient of the provider's instructions. She verbalized understanding and did not have any further questions or concerns.   RN blood pressure check note reviewed. Agree with documention and plan.

## 2016-03-31 NOTE — Patient Instructions (Addendum)
Per Dr. Abner Greenspan: Continue current medication & regimen. Keep follow-up appointment with PCP on 06/09/16 at 10:15 AM.

## 2016-03-31 NOTE — Progress Notes (Signed)
RN blood pressure check note reviewed. Agree with documention and plan. 

## 2016-03-31 NOTE — Progress Notes (Deleted)
Pre visit review using our clinic review tool, if applicable. No additional management support is needed unless otherwise documented below in the visit note. 

## 2016-04-01 ENCOUNTER — Ambulatory Visit (HOSPITAL_BASED_OUTPATIENT_CLINIC_OR_DEPARTMENT_OTHER)
Admission: RE | Admit: 2016-04-01 | Discharge: 2016-04-01 | Disposition: A | Discharge status: Home / Self Care | Payer: BC Managed Care – PPO | Source: Ambulatory Visit | Attending: Medical | Admitting: Medical

## 2016-04-01 ENCOUNTER — Encounter: Payer: Self-pay | Admitting: Medical

## 2016-04-01 ENCOUNTER — Ambulatory Visit (INDEPENDENT_AMBULATORY_CARE_PROVIDER_SITE_OTHER): Payer: BC Managed Care – PPO | Admitting: Medical

## 2016-04-01 VITALS — BP 120/70 | HR 98 | Temp 97.7°F | Ht 61.0 in | Wt 98.8 lb

## 2016-04-01 DIAGNOSIS — R918 Other nonspecific abnormal finding of lung field: Secondary | ICD-10-CM | POA: Insufficient documentation

## 2016-04-01 DIAGNOSIS — R05 Cough: Secondary | ICD-10-CM | POA: Diagnosis not present

## 2016-04-01 DIAGNOSIS — R059 Cough, unspecified: Secondary | ICD-10-CM

## 2016-04-01 DIAGNOSIS — J0100 Acute maxillary sinusitis, unspecified: Secondary | ICD-10-CM | POA: Diagnosis not present

## 2016-04-01 DIAGNOSIS — R053 Chronic cough: Secondary | ICD-10-CM

## 2016-04-01 MED ORDER — ALBUTEROL SULFATE HFA 108 (90 BASE) MCG/ACT IN AERS: 1.0000 | INHALATION_SPRAY | Freq: Four times a day (QID) | RESPIRATORY_TRACT | 1 refills | Status: DC | PRN

## 2016-04-01 MED ORDER — AMOXICILLIN-POT CLAVULANATE 875-125 MG PO TABS: 1.0000 | ORAL_TABLET | Freq: Two times a day (BID) | ORAL | 0 refills | Status: DC

## 2016-04-01 NOTE — Patient Instructions (Addendum)
You appear to have a sinus infection. I am prescribing augmetin antibiotic for the infection. To help with the nasal congestion continue your flonase nasal steroid. For your associated cough, use delsym otc.  Since you have cough since august will get cxr today to assess.  For any wheezing will refill albuterol   Rest, hydrate, tylenol for fever.  Follow up in 7 days or as needed.

## 2016-04-01 NOTE — Progress Notes (Signed)
Pre visit review using our clinic review tool, if applicable. No additional management support is needed unless otherwise documented below in the visit note. 

## 2016-04-01 NOTE — Progress Notes (Signed)
   Subjective:    Patient ID: Jamie Cox, female    DOB: Jan 22, 1961, 55 y.o.   MRN: [id number]  HPI  Pt in states since august had cough that never cleared up post viral illness. She does feel some constant pnd.  But recently she feels nasal congested and recently blowing out  Yellow/brown colored mucous with blood tinged appearance.(over past 3 weeks)  Pt has some allergie and she uses zyrtec, flonase and montelukast.  No fever, no chills or sweats.  Here yesterday for  bp check. Told to come back today. Also pt pulse was normal.        Review of Systems  Constitutional: Negative for chills, fatigue and fever.  HENT: Positive for congestion, postnasal drip and sinus pressure. Negative for ear pain, sneezing and sore throat.   Respiratory: Positive for cough. Negative for chest tightness, shortness of breath and wheezing.   Cardiovascular: Negative for chest pain and palpitations.  Gastrointestinal: Negative for abdominal pain.  Musculoskeletal: Negative for back pain and joint swelling.  Neurological: Negative for dizziness, speech difficulty, weakness, numbness and headaches.  Hematological: Negative for adenopathy. Does not bruise/bleed easily.  Psychiatric/Behavioral: Negative for behavioral problems and confusion.       Objective:   Physical Exam  General  Mental Status - Alert. General Appearance - Well groomed. Not in acute distress.  Skin Rashes- No Rashes.  HEENT Head- Normal. Ear Auditory Canal - Left- Normal. Right - Normal.Tympanic Membrane- Left- Normal. Right- Normal. Eye Sclera/Conjunctiva- Left- Normal. Right- Normal. Nose & Sinuses Nasal Mucosa- Left-  Boggy and Congested. Right-  Boggy and  Congested.Bilateral no  maxillary and on  frontal sinus pressure.(in am feels sinus pressure) Mouth & Throat Lips: Upper Lip- Normal: no dryness, cracking, pallor, cyanosis, or vesicular eruption. Lower Lip-Normal: no dryness, cracking, pallor, cyanosis or  vesicular eruption. Buccal Mucosa- Bilateral- No Aphthous ulcers. Oropharynx- No Discharge or Erythema. +pnd. Tonsils: Characteristics- Bilateral- No Erythema or Congestion. Size/Enlargement- Bilateral- No enlargement. Discharge- bilateral-None.  Neck Neck- Supple. No Masses.   Chest and Lung Exam Auscultation: Breath Sounds:-Clear even and unlabored.  Cardiovascular Auscultation:Rythm- Regular, rate and rhythm. Murmurs & Other Heart Sounds:Ausculatation of the heart reveal- No Murmurs.  Lymphatic Head & Neck General Head & Neck Lymphatics: Bilateral: Description- No Localized lymphadenopathy.       Assessment & Plan:  You appear to have a sinus infection. I am prescribing augmentin  antibiotic for the infection. To help with the nasal congestion continue your flonase nasal steroid. For your associated cough, use delsym otc.  Since you have cough since august will get cxr today to assess.  For any wheezing will refill albuterol   Rest, hydrate, tylenol for fever.  Follow up in 7 days or as needed.  Jamie Cox, Ramon Dredge, PA-C

## 2016-05-04 ENCOUNTER — Encounter: Payer: Self-pay | Admitting: Family Medicine

## 2016-05-04 ENCOUNTER — Ambulatory Visit (INDEPENDENT_AMBULATORY_CARE_PROVIDER_SITE_OTHER): Payer: BC Managed Care – PPO | Admitting: Family Medicine

## 2016-05-04 VITALS — BP 90/60 | Temp 98.4°F | Ht 61.0 in | Wt 100.0 lb

## 2016-05-04 DIAGNOSIS — R05 Cough: Secondary | ICD-10-CM

## 2016-05-04 DIAGNOSIS — R053 Chronic cough: Secondary | ICD-10-CM

## 2016-05-04 MED ORDER — BECLOMETHASONE DIPROPIONATE 40 MCG/ACT IN AERS: 2.0000 | INHALATION_SPRAY | Freq: Two times a day (BID) | RESPIRATORY_TRACT | 12 refills | Status: DC

## 2016-05-04 NOTE — Patient Instructions (Signed)
Claritin (loratadine), Allegra (fexofenadine), Zyrtec (cetirizine); these are listed in order from weakest to strongest. Generic, and therefore cheaper, options are in the parentheses.   There are available OTC, and the generic versions, which may be cheaper, are in parentheses. Show this to a pharmacist if you have trouble finding any of these items.  

## 2016-05-04 NOTE — Progress Notes (Signed)
Pre visit review using our clinic review tool, if applicable. No additional management support is needed unless otherwise documented below in the visit note. 

## 2016-05-04 NOTE — Progress Notes (Signed)
Chief Complaint  Patient presents with  . Follow-up    on cough-product-soreness,h/a    Jamie Cox is 55 y.o. and is here for a cough.  Duration: 3 months Productive? sometimes Associated symptoms: nasal congestion, runny nose Denies: fever, night sweats, facial pain, dyspnea, wheezing, hemoptysis ACEi? No  She was seen 1 month ago and diagnosed with acute sinusitis and given a ten-day course of Augmentin. She also had a normal chest x-ray. Symptoms have not changed since then. She was given an albuterol inhaler which she states does help with her cough. She had a pulmonary function tests without methacholine challenge over 5 years ago. She's never been diagnosed with asthma or COPD before. She does have a significant history of secondhand smoke exposure, however was never a smoker herself. She has been using Claritin, Flonase, and Singulair for her history of allergies. Intermittently, she will feel like she has a postnasal drip causing a cough, but not always. She is having left-sided rib pain from coughing.  ROS:  Resp: +Cough MSK: +L rib pain  Past Medical History:  Diagnosis Date  . Chronic sinusitis    Followed by Dr. Jenne Pane  . Environmental allergies   . GERD (gastroesophageal reflux disease)   . History of chicken pox   . Hypercalcemia 07/19/2015  . Osteoporosis, unspecified    steroid induced  . Personal history of other diseases of circulatory system   . Rheumatoid arthritis(714.0)   . Unspecified deficiency anemia    microcytic   Family History  Problem Relation Age of Onset  . Liver cancer Maternal Grandmother   . Hypertension Maternal Grandmother   . Heart attack Maternal Grandmother   . Scleroderma Mother   . Heart disease Mother   . Kidney disease Mother   . Prostate cancer Father   . Diabetes Father   . Heart disease Father     CHF  . Arthritis Father     s/p hip replacement  . Cancer Father     lung cancer with mets, smoker  . Lung cancer Paternal  Uncle   . Coronary artery disease Paternal Grandmother   . Hypertension Paternal Grandmother   . Liver cancer Paternal Grandmother   . Hyperlipidemia Son   . Alzheimer's disease Paternal Grandfather   . Other Sister     Cardiomegaly  . Arthritis Sister   . Heart disease Sister     rare  . Sleep apnea Brother   . Arthritis Daughter   . Lupus Cousin   . Arthritis Cousin   . Colon cancer Neg Hx      Medication List       Accurate as of 05/04/16 11:46 AM. Always use your most recent med list.          acetaminophen 650 MG CR tablet Commonly known as:  TYLENOL Take 650 mg by mouth every 8 (eight) hours as needed. For pain   albuterol 108 (90 Base) MCG/ACT inhaler Commonly known as:  PROVENTIL HFA;VENTOLIN HFA Inhale 1-2 puffs into the lungs every 6 (six) hours as needed for wheezing or shortness of breath (cough). For shortness of breath/wheezing   beclomethasone 40 MCG/ACT inhaler Commonly known as:  QVAR Inhale 2 puffs into the lungs 2 (two) times daily. Rinse mouth out after each use.   CALCIUM 500/D 500-200 MG-UNIT tablet Generic drug:  calcium-vitamin D 1 tablet.   Ferrous Fumarate-Folic Acid 324-1 MG Tabs Commonly known as:  HEMOCYTE-F Take 1 tablet by mouth daily.   fluticasone 50  MCG/ACT nasal spray Commonly known as:  FLONASE Place 2 sprays into both nostrils daily.   HYDROcodone-acetaminophen 5-325 MG tablet Commonly known as:  NORCO/VICODIN Take 1 tablet by mouth every 6 (six) hours as needed for moderate pain.   hyoscyamine 0.125 MG tablet Commonly known as:  LEVSIN, ANASPAZ Take 1 tablet (0.125 mg total) by mouth every 4 (four) hours as needed.   loratadine 10 MG tablet Commonly known as:  CLARITIN 10 mg.   meloxicam 7.5 MG tablet Commonly known as:  MOBIC Take 1-2 tablets by mouth daily as needed for pain.   metoprolol succinate 25 MG 24 hr tablet Commonly known as:  TOPROL-XL Take 1 tablet (25 mg total) by mouth daily.   montelukast 10  MG tablet Commonly known as:  SINGULAIR Take 1 tablet (10 mg total) by mouth at bedtime.   multivitamin capsule Take 1 capsule by mouth daily.   pantoprazole 40 MG tablet Commonly known as:  PROTONIX Take 1 tablet (40 mg total) by mouth daily.   predniSONE 2.5 MG tablet Commonly known as:  DELTASONE TAKE ONE TABLET BY MOUTH ONCE DAILY WITH BREAKFAST   ranitidine 300 MG tablet Commonly known as:  ZANTAC Take 0.5 tablets (150 mg total) by mouth at bedtime.       BP 90/60 (BP Location: Left Arm, Patient Position: Sitting, Cuff Size: Normal)   Temp 98.4 F (36.9 C) (Oral)   Ht 5\' 1"  (1.549 m)   Wt 100 lb (45.4 kg)   BMI 18.89 kg/m  Gen: Awake, alert, appears stated age HEENT: Ears neg, nares patent without D/C, turbinates unremarkable, Pharynx pink without exudate, MMM Neck: Supple, no masses or asymmetry, no tenderness Heart: RRR, no murmurs, no LE edema, bruits Lungs: CTAB, normal effort, no accessory muscle use Psych: Age appropriate judgement and insight, normal mood and affect  Chronic cough - Plan: beclomethasone (QVAR) 40 MCG/ACT inhaler, Pulmonary function test  Orders as above. Reminded her to rinse mouth out after use. PFT ordered with methacholine challenge. F/u in 4 weeks with me or regular PCP- canceled to reschedule if she has not had her pulmonary function tests done. The patient voiced understanding and agreement to the plan.  [place], DO 05/04/16 11:46 AM

## 2016-05-06 ENCOUNTER — Ambulatory Visit (INDEPENDENT_AMBULATORY_CARE_PROVIDER_SITE_OTHER): Payer: BC Managed Care – PPO | Admitting: Internal Medicine

## 2016-05-06 DIAGNOSIS — R05 Cough: Secondary | ICD-10-CM

## 2016-05-06 DIAGNOSIS — R053 Chronic cough: Secondary | ICD-10-CM

## 2016-05-06 LAB — PULMONARY FUNCTION TEST
DL/VA % PRED: 87 %
DL/VA: 3.71 ml/min/mmHg/L
DLCO COR: 9.62 ml/min/mmHg
DLCO cor % pred: 51 %
DLCO unc % pred: 45 %
DLCO unc: 8.52 ml/min/mmHg
FEF 25-75 Post: 1.59 L/sec
FEF 25-75 Pre: 1.55 L/sec
FEF2575-%CHANGE-POST: 2 %
FEF2575-%PRED-PRE: 81 %
FEF2575-%Pred-Post: 83 %
FEV1-%CHANGE-POST: 3 %
FEV1-%PRED-PRE: 76 %
FEV1-%Pred-Post: 79 %
FEV1-POST: 1.43 L
FEV1-PRE: 1.38 L
FEV1FVC-%CHANGE-POST: -2 %
FEV1FVC-%Pred-Pre: 104 %
FEV6-%CHANGE-POST: 5 %
FEV6-%PRED-PRE: 75 %
FEV6-%Pred-Post: 79 %
FEV6-PRE: 1.65 L
FEV6-Post: 1.75 L
FEV6FVC-%PRED-PRE: 104 %
FEV6FVC-%Pred-Post: 104 %
FVC-%CHANGE-POST: 5 %
FVC-%PRED-POST: 76 %
FVC-%Pred-Pre: 72 %
FVC-POST: 1.75 L
FVC-Pre: 1.65 L
POST FEV6/FVC RATIO: 100 %
PRE FEV6/FVC RATIO: 100 %
Post FEV1/FVC ratio: 82 %
Pre FEV1/FVC ratio: 84 %
RV % PRED: 97 %
RV: 1.7 L
TLC % PRED: 73 %
TLC: 3.27 L

## 2016-05-08 ENCOUNTER — Other Ambulatory Visit: Payer: Self-pay | Admitting: Family Medicine

## 2016-05-08 DIAGNOSIS — R05 Cough: Secondary | ICD-10-CM

## 2016-05-08 DIAGNOSIS — R059 Cough, unspecified: Secondary | ICD-10-CM

## 2016-05-09 ENCOUNTER — Ambulatory Visit (INDEPENDENT_AMBULATORY_CARE_PROVIDER_SITE_OTHER): Payer: BC Managed Care – PPO | Admitting: Family Medicine

## 2016-05-09 ENCOUNTER — Encounter: Payer: Self-pay | Admitting: Family Medicine

## 2016-05-09 ENCOUNTER — Telehealth: Payer: Self-pay | Admitting: Family Medicine

## 2016-05-09 VITALS — BP 98/60 | Temp 98.0°F | Ht 61.0 in | Wt 101.2 lb

## 2016-05-09 DIAGNOSIS — R053 Chronic cough: Secondary | ICD-10-CM

## 2016-05-09 DIAGNOSIS — R05 Cough: Secondary | ICD-10-CM

## 2016-05-09 DIAGNOSIS — Z23 Encounter for immunization: Secondary | ICD-10-CM | POA: Diagnosis not present

## 2016-05-09 MED ORDER — PROMETHAZINE HCL 6.25 MG/5ML PO SYRP: 6.2500 mg | ORAL_SOLUTION | Freq: Every evening | ORAL | 0 refills | Status: DC | PRN

## 2016-05-09 NOTE — Telephone Encounter (Signed)
Patient called stating that nothing she has been given has helped with her cough. She stated that after having a coughing fit she could not breath and ended up calling 911 she did not go to the ED because she was able to breath after a bit. She would like to know if she could get a cough medicine prescribed to help her or if she needs to be seen again? Please advise.  Patient phone: [phone]

## 2016-05-09 NOTE — Telephone Encounter (Signed)
Please advise for further recommendtions. TL/CMA

## 2016-05-09 NOTE — Progress Notes (Signed)
Pre visit review using our clinic review tool, if applicable. No additional management support is needed unless otherwise documented below in the visit note. 

## 2016-05-09 NOTE — Telephone Encounter (Signed)
Did the inhalers I called in not help? If her breathing is that bad where she needs to call 911 and the inhalers are not working, I would like to see her to speed up the workup. Thanks.

## 2016-05-09 NOTE — Progress Notes (Signed)
Chief Complaint  Patient presents with  . Cough    Jamie Cox is 55 y.o. and is here for a cough.  Duration: 3.5 months Productive? No Associated symptoms: intermittent SOB Denies: fever, night sweats, nasal congestion, sore throat, weight loss Hx of GERD? Yes- She has been on several different PPIs ACEi? No  Had a normal chest x-ray after the cough started. She recently had pulmonary function tests done that did show interstitial changes. Of note, she does have a history of rheumatoid arthritis. She's not seen a rheumatologist in over a year. She is supposed via plaque no, however did not follow-up with her eye exam. She was started on an albuterol inhaler in addition to a daily inhaled corticosteroid. She's not noticed a difference, however is only been on the medication for about 3 days.  ROS:  Resp: +Cough Cardio: No chest pain  Past Medical History:  Diagnosis Date  . Chronic sinusitis    Followed by Dr. Jenne Pane  . Environmental allergies   . GERD (gastroesophageal reflux disease)   . History of chicken pox   . Hypercalcemia 07/19/2015  . Osteoporosis, unspecified    steroid induced  . Personal history of other diseases of circulatory system   . Rheumatoid arthritis(714.0)   . Unspecified deficiency anemia    microcytic   Family History  Problem Relation Age of Onset  . Liver cancer Maternal Grandmother   . Hypertension Maternal Grandmother   . Heart attack Maternal Grandmother   . Scleroderma Mother   . Heart disease Mother   . Kidney disease Mother   . Prostate cancer Father   . Diabetes Father   . Heart disease Father     CHF  . Arthritis Father     s/p hip replacement  . Cancer Father     lung cancer with mets, smoker  . Lung cancer Paternal Uncle   . Coronary artery disease Paternal Grandmother   . Hypertension Paternal Grandmother   . Liver cancer Paternal Grandmother   . Hyperlipidemia Son   . Alzheimer's disease Paternal Grandfather   . Other  Sister     Cardiomegaly  . Arthritis Sister   . Heart disease Sister     rare  . Sleep apnea Brother   . Arthritis Daughter   . Lupus Cousin   . Arthritis Cousin   . Colon cancer Neg Hx      Medication List       Accurate as of 05/09/16  4:09 PM. Always use your most recent med list.          acetaminophen 650 MG CR tablet Commonly known as:  TYLENOL Take 650 mg by mouth every 8 (eight) hours as needed. For pain   albuterol 108 (90 Base) MCG/ACT inhaler Commonly known as:  PROVENTIL HFA;VENTOLIN HFA Inhale 1-2 puffs into the lungs every 6 (six) hours as needed for wheezing or shortness of breath (cough). For shortness of breath/wheezing   beclomethasone 40 MCG/ACT inhaler Commonly known as:  QVAR Inhale 2 puffs into the lungs 2 (two) times daily. Rinse mouth out after each use.   CALCIUM 500/D 500-200 MG-UNIT tablet Generic drug:  calcium-vitamin D 1 tablet.   Ferrous Fumarate-Folic Acid 324-1 MG Tabs Commonly known as:  HEMOCYTE-F Take 1 tablet by mouth daily.   fluticasone 50 MCG/ACT nasal spray Commonly known as:  FLONASE Place 2 sprays into both nostrils daily.   HYDROcodone-acetaminophen 5-325 MG tablet Commonly known as:  NORCO/VICODIN Take 1 tablet by  mouth every 6 (six) hours as needed for moderate pain.   hyoscyamine 0.125 MG tablet Commonly known as:  LEVSIN, ANASPAZ Take 1 tablet (0.125 mg total) by mouth every 4 (four) hours as needed.   loratadine 10 MG tablet Commonly known as:  CLARITIN 10 mg.   meloxicam 7.5 MG tablet Commonly known as:  MOBIC Take 1-2 tablets by mouth daily as needed for pain.   metoprolol succinate 25 MG 24 hr tablet Commonly known as:  TOPROL-XL Take 1 tablet (25 mg total) by mouth daily.   montelukast 10 MG tablet Commonly known as:  SINGULAIR Take 1 tablet (10 mg total) by mouth at bedtime.   multivitamin capsule Take 1 capsule by mouth daily.   pantoprazole 40 MG tablet Commonly known as:  PROTONIX Take 1  tablet (40 mg total) by mouth daily.   predniSONE 2.5 MG tablet Commonly known as:  DELTASONE TAKE ONE TABLET BY MOUTH ONCE DAILY WITH BREAKFAST   promethazine 6.25 MG/5ML syrup Commonly known as:  PHENERGAN Take 5 mLs (6.25 mg total) by mouth at bedtime as needed (Cough).   ranitidine 300 MG tablet Commonly known as:  ZANTAC Take 0.5 tablets (150 mg total) by mouth at bedtime.       BP 98/60 (BP Location: Left Arm, Patient Position: Sitting, Cuff Size: Small)   Temp 98 F (36.7 C) (Oral)   Ht 5\' 1"  (1.549 m)   Wt 101 lb 3.2 oz (45.9 kg)   BMI 19.12 kg/m  Gen: Awake, alert, appears stated age HEENT: Ears neg, nares patent without D/C, turbinates unremarkable, Pharynx pink without exudate Neck: Supple, no masses or asymmetry, no tenderness Heart: RRR, no murmurs, no LE edema Lungs: CTAB, normal effort, no accessory muscle use Psych: Age appropriate judgement and insight, normal mood and affect  Chronic cough - Plan: promethazine (PHENERGAN) 6.25 MG/5ML syrup  Encounter for immunization - Plan: Flu Vaccine QUAD 36+ mos IM  Orders as above. Syrup to coat throat before bed. I am too tentative to add a narcotic cough syrup given her hydrocodone use. Continue inhalers for at least a few more weeks. Let know if she needs a referral for rheumatology. Wondering if there is an autoimmune effect for her lungs given the PFT's.  F/u pending her appts with specialists. The patient voiced understanding and agreement to the plan.  [place], DO 05/09/16 4:09 PM

## 2016-05-09 NOTE — Telephone Encounter (Signed)
I have scheduled patient to come in the office today to be seen. TL/CMA

## 2016-05-09 NOTE — Patient Instructions (Signed)
Wait for a call regarding your lung tests.  Make an appointment with your rheumatologist and eye doctor regarding Plaquenil. Let us know if you need a referral again. Remember to ask for Dr. Carmelia Roller, who will place the referral since we discussed it.

## 2016-05-27 ENCOUNTER — Encounter: Payer: Self-pay | Admitting: Internal Medicine

## 2016-05-27 ENCOUNTER — Ambulatory Visit (INDEPENDENT_AMBULATORY_CARE_PROVIDER_SITE_OTHER): Payer: BC Managed Care – PPO | Admitting: Internal Medicine

## 2016-05-27 VITALS — BP 118/78 | HR 99 | Ht 61.0 in | Wt 98.8 lb

## 2016-05-27 DIAGNOSIS — R05 Cough: Secondary | ICD-10-CM | POA: Diagnosis not present

## 2016-05-27 DIAGNOSIS — R053 Chronic cough: Secondary | ICD-10-CM

## 2016-05-27 MED ORDER — BUDESONIDE-FORMOTEROL FUMARATE 80-4.5 MCG/ACT IN AERO: 2.0000 | INHALATION_SPRAY | Freq: Two times a day (BID) | RESPIRATORY_TRACT | 3 refills | Status: DC

## 2016-05-27 MED ORDER — BUDESONIDE-FORMOTEROL FUMARATE 80-4.5 MCG/ACT IN AERO: 2.0000 | INHALATION_SPRAY | Freq: Two times a day (BID) | RESPIRATORY_TRACT | 0 refills | Status: DC

## 2016-05-27 MED ORDER — AMOXICILLIN-POT CLAVULANATE 875-125 MG PO TABS: 1.0000 | ORAL_TABLET | Freq: Two times a day (BID) | ORAL | 0 refills | Status: AC

## 2016-05-27 NOTE — Progress Notes (Signed)
Subjective:    Patient ID: Jamie Cox, female    DOB: March 04, 1961 .   MRN: [id number]   Brief patient profile:   40  yobf never smoker dx RA in her early 20's steroid dep x around 2000 but also mtx, plaquenil in past per Dr Coral Spikes and also seen [facility] and also at [place] with ? 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     History of Present Illness  August 10, 2010 1st pulmonary office eval cc for cough starting around 2006 but much worse x 4 months with constant need to clear the throat, mucus is clear, cough so hard looses breath. Much worse then ended in ER with dx of pna with cp on R rx as outpt with abx and cp now gone but still coughing. arthritis worse x 4 months and plans to go establish at [facility].  Imp was [place] plus/minus uacs  rec  1) Prednsione 10 mg 4 each am x 2days, 2x2days, 1x2days and stop and resume prednisone 2.5 mg daily  2) Stop boniva  3) Start Nexium Take one 30-60 min before first meal of the day and also Zantac 150 mg one at bedtime  4) GERD (REFLUX) diet   09/14/10  ov cc cough dry day = night,  pred at 2.5 mg per day cough was better on the higher doses but plan is to start plaquenil  per [facility].  Mostly dry.   >>Sinus CT pos+>Augmentin x 10d , zantac At bedtime        12/23/2010 ov/Tajanay Hurley  tapered per rheum to  2.5 prednisone per day,  One episode of cp post on R same location as before but not as intense and better since the z pack, not consistent with ppi ac but is holding boniva. Overall better but concerned cough still intermittently present. No purulent sputum. rec For cough take mucinex dm twice daily( 1200mg  every 12 hours) and if this fails use vicodin Protonix 40 mg Take 30-60 min before first meal of the day and Zantac at bedtime perfectly regularly for a month Please see patient coordinator before you leave today  to schedule for CT chest and sinus > improved  Sinus surgery in Jan 2014 > marked improvement         05/27/2016  Consultation/ re-establish  /[place] [place] re: recurrent cough in pt with RA pred 2.5 mg daily  Chief Complaint  Patient presents with  . Pulm Consult    Productive cough x4 months ago. Cough has been getting worse. Cloudy white phlegm.   on qvar 40 on avg 2puff daily and not using saba appropriately/ poor hfa  Onset was insidious x 4 m prior to OV  With progressively worse 24/7 cough / very congested with grey mucus  Doe = MMRC2 = can't walk a nl pace on a flat grade s sob but does fine slow and flat eg shopping Cough worse with exp to fumes/hot air not better on qvar  - better in cooler conditions  No obvious other patterns in  day to day or daytime variability or assoc  or mucus plugs or hemoptysis or cp or chest tightness, subjective wheeze or overt sinus or hb symptoms. No unusual exp hx or h/o childhood pna/ asthma or knowledge of premature birth.  Sleeping ok without nocturnal  or early am exacerbation  of respiratory  c/o's or need for noct saba. Also denies any obvious fluctuation of symptoms with weather or environmental changes or other aggravating or alleviating factors except  as outlined above   Current Medications, Allergies, Complete Past Medical History, Past Surgical History, Family History, and Social History were reviewed in [employer] record.  ROS  The following are not active complaints unless bolded sore throat, dysphagia, dental problems, itching, sneezing,  nasal congestion or excess/ purulent secretions, ear ache,   fever, chills, sweats, unintended wt loss, classically pleuritic or exertional cp,  orthopnea pnd or leg swelling, presyncope, palpitations, abdominal pain, anorexia, nausea, vomiting, diarrhea  or change in bowel or bladder habits, change in stools or urine, dysuria,hematuria,  rash, arthralgias, visual complaints, headache, numbness, weakness or ataxia or problems with walking or coordination,  change in mood/affect or memory.                     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  Wt Readings from Last 3 Encounters:  05/27/16 98 lb 12.8 oz (44.8 kg)  05/09/16 101 lb 3.2 oz (45.9 kg)  05/04/16 100 lb (45.4 kg)   Wt 105 12/23/2010    Vital signs reviewed - Note on arrival 02 sats  100% on  RA      HEENT: nl dentition, turbinates, and oropharynx. Nl external ear canals without cough reflex  NECK : without JVD/Nodes/TM/ nl carotid upstrokes bilaterally  LUNGS: no acc muscle use, junky bilateral insp and exp rhonchi  CV: RRR no s3 or murmur or increase in P2, no edema .  ABD: soft and nontender with nl excursion in the supine position. No bruits or organomegaly, bowel sounds nl  MS: warm with classic moderate RA changes both hands no calf tenderness, cyanosis or clubbing  Neuro:  Alert/ nl sensorium/ no motor def/ nl gait       I personally reviewed images and agree with radiology impression as follows:  CXR:   04/01/16 1.  No acute cardiopulmonary disease. 2. Similar findings of lung hyper expansion and bibasilar fibrosis/bronchiectasis without superimposed acute cardiopulmonary disease.     Assessment & Plan:

## 2016-05-27 NOTE — Patient Instructions (Addendum)
Stop qvar   Start symbicort 80 Take 2 puffs first thing in am and then another 2 puffs about 12 hours later.   Work on inhaler technique:  relax and gently blow all the way out then take a nice smooth deep breath back in, triggering the inhaler at same time you start breathing in.  Hold for up to 5 seconds if you can. Blow out thru nose. Rinse and gargle with water when done      Only use your albuterol as a rescue medication to be used if you can't catch your breath by resting or doing a relaxed purse lip breathing pattern.  - The less you use it, the better it will work when you need it. - Ok to use up to 2 puffs  every 4 hours if you must but call for immediate appointment if use goes up over your usual need - Don't leave home without it !!  (think of it like the spare tire for your car)   For cough > mucinex dm 1200 mg every 12 hours and the flutter valve as much as possible   Augmentin 875 mg take one pill twice daily  X 10 days - take at breakfast and supper with large glass of water.  It would help reduce the usual side effects (diarrhea and yeast infections) if you ate cultured yogurt at lunch.   Bronchiectasis =   you have scarring of your bronchial tubes which means that they don't function perfectly normally and mucus tends to pool in certain areas of your lung which can cause pneumonia and further scarring of your lung and bronchial tubes     Please schedule a follow up office visit in 4 weeks, sooner if needed with pfts and cxr on return

## 2016-05-28 ENCOUNTER — Encounter: Payer: Self-pay | Admitting: Internal Medicine

## 2016-05-28 NOTE — Assessment & Plan Note (Addendum)
CT sinus 09/14/10 Positive >10 d of Augmentin - Recheck sinus ct 12/24/10 > improved  - Improved p sinus surgery 06/2012  - PFT's  05/06/2016  FEV1 1.43 (79 % ) ratio 82  p 0 % improvement from saba p nothing prior to study with DLCO  45 % corrects to 51  % for alv volume   - 05/27/2016 re established rec augmentinx 10 days/ flutter valve training - 05/27/2016  After extensive coaching HFA effectiveness =    75% try symbicort 80 2bid   The most common causes of chronic cough in immunocompetent adults include the following: upper airway cough syndrome (UACS), previously referred to as postnasal drip syndrome (PNDS), which is caused by variety of rhinosinus conditions; (2) asthma; (3) GERD; (4) chronic bronchitis from cigarette smoking or other inhaled environmental irritants; (5) nonasthmatic eosinophilic bronchitis; and (6) bronchiectasis.   These conditions, singly or in combination, have accounted for up to 94% of the causes of chronic cough in prospective studies.   Other conditions have constituted no >6% of the causes in prospective studies These have included bronchogenic carcinoma, chronic interstitial pneumonia, sarcoidosis, left ventricular failure, ACEI-induced cough, and aspiration from a condition associated with pharyngeal dysfunction.    Chronic cough is often simultaneously caused by more than one condition. A single cause has been found from 38 to 82% of the time, multiple causes from 18 to 62%. Multiply caused cough has been the result of three diseases up to 42% of the time.   pfts rule against RA bronchiolitis/ILD  or asthma    Strongly suspect bronchiectasis/recurrent sinusitis  here but need hrct to confirm and in meantime need to make stronger effort at improving mc function with LABA/ low dose IBS/ add flutter and give just short course of augmentin  Also reviewed rx for GERD since it so frequently is assoc with chronic cough and bronchiectasis  Total time devoted to  counseling  > 50 % of 60 min office visit:  review case with pt/ discussion of options/alternatives/ personally creating written customized instructions  in presence of pt  then going over those specific  Instructions directly with the pt including how to use all of the meds but in particular covering each new medication in detail and the difference between the maintenance/automatic meds and the prns using an action plan format for the latter.  Please see AVS from this visit for a full list of these instructions

## 2016-06-01 ENCOUNTER — Telehealth: Payer: Self-pay | Admitting: *Deleted

## 2016-06-01 DIAGNOSIS — R053 Chronic cough: Secondary | ICD-10-CM

## 2016-06-01 DIAGNOSIS — R05 Cough: Secondary | ICD-10-CM

## 2016-06-01 NOTE — Telephone Encounter (Signed)
-----   Message from Nyoka Cowden, MD sent at 05/28/2016  7:27 AM EST ----- Needs limited sinus CT when come in for hrct chest  Dx for both is cough

## 2016-06-01 NOTE — Telephone Encounter (Signed)
Pt returning call.Jamie Cox ° °

## 2016-06-01 NOTE — Telephone Encounter (Signed)
Spoke with the pt and notified of recs per MW  She verbalized understanding  Orders sent to [facility] 

## 2016-06-01 NOTE — Telephone Encounter (Signed)
LMTCB

## 2016-06-09 ENCOUNTER — Encounter: Payer: Self-pay | Admitting: Family Medicine

## 2016-06-09 ENCOUNTER — Ambulatory Visit (INDEPENDENT_AMBULATORY_CARE_PROVIDER_SITE_OTHER): Payer: BC Managed Care – PPO | Admitting: Family Medicine

## 2016-06-09 DIAGNOSIS — D539 Nutritional anemia, unspecified: Secondary | ICD-10-CM | POA: Diagnosis not present

## 2016-06-09 DIAGNOSIS — R7989 Other specified abnormal findings of blood chemistry: Secondary | ICD-10-CM

## 2016-06-09 DIAGNOSIS — R000 Tachycardia, unspecified: Secondary | ICD-10-CM

## 2016-06-09 DIAGNOSIS — M069 Rheumatoid arthritis, unspecified: Secondary | ICD-10-CM | POA: Diagnosis not present

## 2016-06-09 HISTORY — DX: Other specified abnormal findings of blood chemistry: R79.89

## 2016-06-09 LAB — COMPREHENSIVE METABOLIC PANEL
ALBUMIN: 4.2 g/dL (ref 3.5–5.2)
ALT: 15 U/L (ref 0–35)
AST: 19 U/L (ref 0–37)
Alkaline Phosphatase: 53 U/L (ref 39–117)
BUN: 17 mg/dL (ref 6–23)
CHLORIDE: 102 meq/L (ref 96–112)
CO2: 31 mEq/L (ref 19–32)
CREATININE: 0.8 mg/dL (ref 0.40–1.20)
Calcium: 10.1 mg/dL (ref 8.4–10.5)
GFR: 94.83 mL/min (ref >60.00)
Glucose, Bld: 86 mg/dL (ref 70–99)
POTASSIUM: 3.7 meq/L (ref 3.5–5.1)
SODIUM: 138 meq/L (ref 135–145)
Total Bilirubin: 0.2 mg/dL (ref 0.2–1.2)
Total Protein: 8.5 g/dL — ABNORMAL HIGH (ref 6.0–8.3)

## 2016-06-09 LAB — CBC
HEMATOCRIT: 35.1 % — AB (ref 36.0–46.0)
Hemoglobin: 11 g/dL — ABNORMAL LOW (ref 12.0–15.0)
MCHC: 31.4 g/dL (ref 30.0–36.0)
MCV: 72.4 fl — ABNORMAL LOW (ref 78.0–100.0)
Platelets: 282 10*3/uL (ref 150.0–400.0)
RBC: 4.85 Mil/uL (ref 3.87–5.11)
RDW: 18 % — AB (ref 11.5–15.5)
WBC: 9.6 10*3/uL (ref 4.0–10.5)

## 2016-06-09 LAB — VITAMIN D 25 HYDROXY (VIT D DEFICIENCY, FRACTURES): VITD: 37.95 ng/mL (ref 30.00–100.00)

## 2016-06-09 LAB — TSH: TSH: 1.47 u[IU]/mL (ref 0.35–4.50)

## 2016-06-09 MED ORDER — HYDROCODONE-ACETAMINOPHEN 5-325 MG PO TABS: 1.0000 | ORAL_TABLET | Freq: Four times a day (QID) | ORAL | 0 refills | Status: DC | PRN

## 2016-06-09 NOTE — Assessment & Plan Note (Signed)
Mild elevation, improved with recheck

## 2016-06-09 NOTE — Progress Notes (Signed)
Pre visit review using our clinic review tool, if applicable. No additional management support is needed unless otherwise documented below in the visit note. 

## 2016-06-09 NOTE — Progress Notes (Signed)
Subjective:    Patient ID: Jamie Cox, female    DOB: [birth year], 56 y.o.   MRN: [id number]  Chief Complaint  Patient presents with  . Follow-up    HPI Patient is in today for follow up. She is feeling well today and notes after recent antibiotics and the initiation of Symbicort her cough is much better. No fevers and congestion is improved. She continues to struggle with chronic joint pain secondary to her RA and as a result struggles at work. She gets weak and easily fatigued at work but continues to work nonetheless. Denies CP/palp/SOB/HA/congestion/fevers/GI or GU c/o. Taking meds as prescribed  Past Medical History:  Diagnosis Date  . Chronic sinusitis    Followed by Dr. Jenne Pane  . Environmental allergies   . GERD (gastroesophageal reflux disease)   . 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 History of chicken pox   . Hypercalcemia 07/19/2015  . Osteoporosis, unspecified    steroid induced  . Personal history of other diseases of circulatory system   . Rheumatoid arthritis(714.0)   . Unspecified deficiency anemia    microcytic    Past Surgical History:  Procedure Laterality Date  . NASAL SINUS SURGERY  06/2012    Family History  Problem Relation Age of Onset  . Liver cancer Maternal Grandmother   . Hypertension Maternal Grandmother   . Heart attack Maternal Grandmother   . Scleroderma Mother   . Heart disease Mother   . Kidney disease Mother   . Prostate cancer Father   . Diabetes Father   . Heart disease Father     CHF  . Arthritis Father     s/p hip replacement  . Cancer Father     lung cancer with mets, smoker  . Lung cancer Paternal Uncle   . Coronary artery disease Paternal Grandmother   . Hypertension Paternal Grandmother   . Liver cancer Paternal Grandmother   . Hyperlipidemia Son   . Alzheimer's disease Paternal Grandfather   . Other Sister     Cardiomegaly  . Arthritis Sister   . Heart disease Sister     rare  . Sleep apnea Brother    . Arthritis Daughter   . Lupus Cousin   . Arthritis Cousin   . Colon cancer Neg Hx     Social History   Social History  . Marital status: Married    Spouse name: N/A  . Number of children: 2  . Years of education: N/A   Occupational History  . Data processing manager    Social History Main Topics  . Smoking status: Never Smoker  . Smokeless tobacco: Never Used  . Alcohol use No  . Drug use: No  . Sexual activity: Not Currently   Other Topics Concern  . Not on file   Social History Narrative  . No narrative on file    Outpatient Medications Prior to Visit  Medication Sig Dispense Refill  . acetaminophen (TYLENOL) 650 MG CR tablet Take 650 mg by mouth every 8 (eight) hours as needed. For pain    . albuterol (PROVENTIL HFA;VENTOLIN HFA) 108 (90 Base) MCG/ACT inhaler Inhale 1-2 puffs into the lungs every 6 (six) hours as needed for wheezing or shortness of breath (cough). For shortness of breath/wheezing 3.7 g 1  . budesonide-formoterol (SYMBICORT) 80-4.5 MCG/ACT inhaler Inhale 2 puffs into the lungs 2 (two) times daily. 1 Inhaler 0  . budesonide-formoterol (SYMBICORT) 80-4.5 MCG/ACT inhaler Inhale 2 puffs into the lungs 2 (two) times  daily. 1 Inhaler 3  . calcium-vitamin D (CALCIUM 500/D) 500-200 MG-UNIT per tablet 1 tablet.    . Ferrous Fumarate-Folic Acid (HEMOCYTE-F) 324-1 MG TABS Take 1 tablet by mouth daily. 30 each 5  . fluticasone (FLONASE) 50 MCG/ACT nasal spray Place 2 sprays into both nostrils daily. 16 g 3  . hyoscyamine (LEVSIN, ANASPAZ) 0.125 MG tablet Take 1 tablet (0.125 mg total) by mouth every 4 (four) hours as needed. 30 tablet 0  . loratadine (CLARITIN) 10 MG tablet 10 mg.    . meloxicam (MOBIC) 7.5 MG tablet Take 1-2 tablets by mouth daily as needed for pain. 60 tablet 3  . metoprolol succinate (TOPROL-XL) 25 MG 24 hr tablet Take 1 tablet (25 mg total) by mouth daily. 30 tablet 3  . montelukast (SINGULAIR) 10 MG tablet Take 1 tablet (10 mg total) by mouth at  bedtime. 30 tablet 5  . Multiple Vitamin (MULTIVITAMIN) capsule Take 1 capsule by mouth daily.      . pantoprazole (PROTONIX) 40 MG tablet Take 1 tablet (40 mg total) by mouth daily. 90 tablet 1  . predniSONE (DELTASONE) 2.5 MG tablet TAKE ONE TABLET BY MOUTH ONCE DAILY WITH BREAKFAST 90 tablet 1  . promethazine (PHENERGAN) 6.25 MG/5ML syrup Take 5 mLs (6.25 mg total) by mouth at bedtime as needed (Cough). 120 mL 0  . ranitidine (ZANTAC) 300 MG tablet Take 0.5 tablets (150 mg total) by mouth at bedtime. 90 tablet 1  . HYDROcodone-acetaminophen (NORCO/VICODIN) 5-325 MG tablet Take 1 tablet by mouth every 6 (six) hours as needed for moderate pain. 40 tablet 0   No facility-administered medications prior to visit.     Allergies  Allergen Reactions  . Avelox [Moxifloxacin Hcl In Nacl] Diarrhea, Nausea And Vomiting and Other (See Comments)    Dizziness, Cold chills   . Bactrim [Sulfamethoxazole-Trimethoprim] Hives    Review of Systems  Constitutional: Positive for malaise/fatigue. Negative for fever.  HENT: Negative for congestion.   Eyes: Negative for blurred vision.  Respiratory: Negative for cough and shortness of breath.   Cardiovascular: Negative for chest pain, palpitations and leg swelling.  Gastrointestinal: Negative for vomiting.  Musculoskeletal: Positive for back pain and myalgias.  Skin: Negative for rash.  Neurological: Negative for loss of consciousness and headaches.       Objective:    Physical Exam  Constitutional: She is oriented to person, place, and time. She appears well-developed and well-nourished. No distress.  HENT:  Head: Normocephalic and atraumatic.  Eyes: Conjunctivae are normal.  Neck: Normal range of motion. No thyromegaly present.  Cardiovascular: Normal rate and regular rhythm.   Pulmonary/Chest: Effort normal and breath sounds normal. She has no wheezes.  Abdominal: Soft. Bowel sounds are normal. She exhibits no mass. There is no tenderness.    Musculoskeletal: Normal range of motion. She exhibits no edema or deformity.  Neurological: She is alert and oriented to person, place, and time.  Skin: Skin is warm and dry. She is not diaphoretic.  Psychiatric: She has a normal mood and affect.    BP 100/68 (BP Location: Left Arm, Patient Position: Sitting, Cuff Size: Normal)   Pulse 84   Temp 98.1 F (36.7 C) (Oral)   Wt 100 lb 3.2 oz (45.5 kg)   SpO2 97%   BMI 18.93 kg/m  Wt Readings from Last 3 Encounters:  06/09/16 100 lb 3.2 oz (45.5 kg)  05/27/16 98 lb 12.8 oz (44.8 kg)  05/09/16 101 lb 3.2 oz (45.9 kg)  Lab Results  Component Value Date   WBC 9.6 06/09/2016   HGB 11.0 (L) 06/09/2016   HCT 35.1 (L) 06/09/2016   PLT 282.0 06/09/2016   GLUCOSE 86 06/09/2016   CHOL 190 07/13/2015   TRIG 111.0 07/13/2015   HDL 73.90 07/13/2015   LDLCALC 94 07/13/2015   ALT 15 06/09/2016   AST 19 06/09/2016   NA 138 06/09/2016   K 3.7 06/09/2016   CL 102 06/09/2016   CREATININE 0.80 06/09/2016   BUN 17 06/09/2016   CO2 31 06/09/2016   TSH 1.47 06/09/2016    Lab Results  Component Value Date   TSH 1.47 06/09/2016   Lab Results  Component Value Date   WBC 9.6 06/09/2016   HGB 11.0 (L) 06/09/2016   HCT 35.1 (L) 06/09/2016   MCV 72.4 (L) 06/09/2016   PLT 282.0 06/09/2016   Lab Results  Component Value Date   NA 138 06/09/2016   K 3.7 06/09/2016   CO2 31 06/09/2016   GLUCOSE 86 06/09/2016   BUN 17 06/09/2016   CREATININE 0.80 06/09/2016   BILITOT 0.2 06/09/2016   ALKPHOS 53 06/09/2016   AST 19 06/09/2016   ALT 15 06/09/2016   PROT 8.5 (H) 06/09/2016   ALBUMIN 4.2 06/09/2016   CALCIUM 10.1 06/09/2016   CALCIUM 9.8 06/09/2016   GFR 94.83 06/09/2016   Lab Results  Component Value Date   CHOL 190 07/13/2015   Lab Results  Component Value Date   HDL 73.90 07/13/2015   Lab Results  Component Value Date   LDLCALC 94 07/13/2015   Lab Results  Component Value Date   TRIG 111.0 07/13/2015   Lab Results   Component Value Date   CHOLHDL 3 07/13/2015   No results found for: HGBA1C     Assessment & Plan:   Problem List Items Addressed This Visit    Deficiency anemia    Check CBC today      Relevant Orders   CBC (Completed)   TSH (Completed)   Rheumatoid arthritis (HCC)    Given refill on Hydrocodone. Uses for severe joint pain at work on occasion roughly 1-4 tabs weekly. Tries tylenol first if pain is mild. Referred back to Rheumatology at this time.       Relevant Medications   HYDROcodone-acetaminophen (NORCO/VICODIN) 5-325 MG tablet   Other Relevant Orders   Ambulatory referral to Rheumatology   Tachycardia    Mild elevation, improved with recheck      Relevant Orders   Comprehensive metabolic panel (Completed)   CBC (Completed)   Hypercalcemia - Primary    Encouraged tid calcium intake. Check cmp, pth and vitamin D today      Relevant Orders   VITAMIN D 25 Hydroxy (Vit-D Deficiency, Fractures) (Completed)   Comprehensive metabolic panel (Completed)   High serum parathyroid hormone (PTH)   Relevant Orders   PTH, Intact and Calcium (Completed)      I am having Ms. Pingley maintain her acetaminophen, multivitamin, loratadine, calcium-vitamin D, ranitidine, Ferrous Fumarate-Folic Acid, predniSONE, montelukast, fluticasone, meloxicam, pantoprazole, hyoscyamine, metoprolol succinate, albuterol, promethazine, budesonide-formoterol, budesonide-formoterol, and HYDROcodone-acetaminophen.  Meds ordered this encounter  Medications  . HYDROcodone-acetaminophen (NORCO/VICODIN) 5-325 MG tablet    Sig: Take 1 tablet by mouth every 6 (six) hours as needed for moderate pain.    Dispense:  40 tablet    Refill:  0    CMA served as scribe during this visit. History, Physical and Plan performed by medical provider. Documentation and orders  reviewed and attested to.  Penni Homans, MD

## 2016-06-09 NOTE — Assessment & Plan Note (Signed)
Check CBC today.  

## 2016-06-09 NOTE — Patient Instructions (Signed)
Rheumatoid Arthritis Introduction Rheumatoid arthritis (RA) is a long-term (chronic) disease. RA causes inflammation in your joints. Your joints may feel painful, stiff, swollen, warm, or tender. RA may start slowly. Usually, it affects the small joints of the hands and feet. It can also affect other parts of the body, even the heart, eyes, or lungs. Symptoms of RA often come and go. Sometimes, symptoms get worse for a while. These are called flares. There is no cure for RA, but your doctor will work with you to find the best treatment option for you. This will depend on how the disease is changing in your body. Follow these instructions at home:  Take over-the-counter and prescription medicines only as told by your doctor. Your doctor may change (adjust) your medicines every 3 months.  Start an exercise program as told by your doctor.  Rest when you have a flare.  Return to your normal activities as told by your doctor. Ask your doctor what activities are safe for you.  Keep all follow-up visits as told by your doctor. This is important. Contact a doctor if:  You have a flare.  You have a fever.  You have problems (side effects) because of your medicines. Get help right away if:  You have chest pain.  You have trouble breathing.  You have a hot, painful joint all of a sudden, and it is worse than your usual joint aches. This information is not intended to replace advice given to you by your health care provider. Make sure you discuss any questions you have with your health care provider. Document Released: 08/15/2011 Document Revised: 10/29/2015 Document Reviewed: 03/05/2015  2017 Elsevier  

## 2016-06-09 NOTE — Assessment & Plan Note (Signed)
Given refill on Hydrocodone. Uses for severe joint pain at work on occasion roughly 1-4 tabs weekly. Tries tylenol first if pain is mild. Referred back to Rheumatology at this time.

## 2016-06-09 NOTE — Assessment & Plan Note (Signed)
Is now established with pulmonology after antibiotics and on Symbicort has appt in January

## 2016-06-09 NOTE — Assessment & Plan Note (Signed)
Encouraged tid calcium intake. Check cmp, pth and vitamin D today

## 2016-06-09 NOTE — Assessment & Plan Note (Signed)
Encouraged to get adequate exercise, calcium and vitamin d intake 

## 2016-06-10 ENCOUNTER — Telehealth: Payer: Self-pay | Admitting: Family Medicine

## 2016-06-10 LAB — PTH, INTACT AND CALCIUM
CALCIUM: 9.8 mg/dL (ref 8.6–10.4)
PTH: 30 pg/mL (ref 14–64)

## 2016-06-10 NOTE — Telephone Encounter (Signed)
Patient informed of results.  

## 2016-06-10 NOTE — Telephone Encounter (Signed)
Relation to TE:LMRA Call back number:[phone]   Reason for call:  Patient returning call regarding lab results

## 2016-06-17 ENCOUNTER — Telehealth: Payer: Self-pay | Admitting: Internal Medicine

## 2016-06-17 MED ORDER — AMOXICILLIN-POT CLAVULANATE 875-125 MG PO TABS: 1.0000 | ORAL_TABLET | Freq: Two times a day (BID) | ORAL | 0 refills | Status: DC

## 2016-06-17 NOTE — Telephone Encounter (Signed)
Augmentin 875 mg take one pill twice daily  X 10 days - take at breakfast and supper with large glass of water.  It would help reduce the usual side effects (diarrhea and yeast infections) if you ate cultured yogurt at lunch.   Keep appt for ct's

## 2016-06-17 NOTE — Telephone Encounter (Signed)
Spoke with pt. States her cough has returned. Cough is producing green/gray. Denies chest tightness, wheezing, SOB or fever. She finished the antibiotic that was prescribed. Would like MW's recommendations.  MW - please advise. Thanks.

## 2016-06-17 NOTE — Telephone Encounter (Signed)
Spoke with pt. She is aware of MW's recommendations. Rx has been called in. Nothing further was needed.

## 2016-06-28 ENCOUNTER — Ambulatory Visit (INDEPENDENT_AMBULATORY_CARE_PROVIDER_SITE_OTHER)
Admission: RE | Admit: 2016-06-28 | Discharge: 2016-06-28 | Disposition: A | Discharge status: Home / Self Care | Payer: BC Managed Care – PPO | Source: Ambulatory Visit | Attending: Internal Medicine | Admitting: Internal Medicine

## 2016-06-28 ENCOUNTER — Other Ambulatory Visit: Payer: Self-pay | Admitting: Internal Medicine

## 2016-06-28 ENCOUNTER — Ambulatory Visit (INDEPENDENT_AMBULATORY_CARE_PROVIDER_SITE_OTHER): Payer: BC Managed Care – PPO | Admitting: Internal Medicine

## 2016-06-28 ENCOUNTER — Encounter: Payer: Self-pay | Admitting: Internal Medicine

## 2016-06-28 ENCOUNTER — Encounter: Payer: Self-pay | Admitting: *Deleted

## 2016-06-28 VITALS — BP 118/72 | HR 70 | Ht 61.0 in | Wt 98.4 lb

## 2016-06-28 DIAGNOSIS — R05 Cough: Secondary | ICD-10-CM

## 2016-06-28 DIAGNOSIS — J479 Bronchiectasis, uncomplicated: Secondary | ICD-10-CM | POA: Diagnosis not present

## 2016-06-28 DIAGNOSIS — R053 Chronic cough: Secondary | ICD-10-CM

## 2016-06-28 DIAGNOSIS — J328 Other chronic sinusitis: Secondary | ICD-10-CM

## 2016-06-28 LAB — PULMONARY FUNCTION TEST
DL/VA % pred: 106 %
DL/VA: 4.53 ml/min/mmHg/L
DLCO COR % PRED: 69 %
DLCO COR: 13.14 ml/min/mmHg
DLCO UNC: 13.41 ml/min/mmHg
DLCO unc % pred: 71 %
FEF 25-75 POST: 1.54 L/s
FEF 25-75 PRE: 1.53 L/s
FEF2575-%Change-Post: 0 %
FEF2575-%PRED-PRE: 81 %
FEF2575-%Pred-Post: 81 %
FEV1-%Change-Post: 2 %
FEV1-%PRED-POST: 90 %
FEV1-%PRED-PRE: 88 %
FEV1-POST: 1.62 L
FEV1-Pre: 1.59 L
FEV1FVC-%Change-Post: 0 %
FEV1FVC-%Pred-Pre: 103 %
FEV6-%CHANGE-POST: 2 %
FEV6-%PRED-POST: 89 %
FEV6-%PRED-PRE: 87 %
FEV6-POST: 1.96 L
FEV6-Pre: 1.92 L
FEV6FVC-%PRED-POST: 104 %
FEV6FVC-%Pred-Pre: 104 %
FVC-%Change-Post: 2 %
FVC-%Pred-Post: 85 %
FVC-%Pred-Pre: 84 %
FVC-Post: 1.96 L
FVC-Pre: 1.92 L
POST FEV6/FVC RATIO: 100 %
PRE FEV1/FVC RATIO: 83 %
Post FEV1/FVC ratio: 83 %
Pre FEV6/FVC Ratio: 100 %
RV % pred: 75 %
RV: 1.31 L
TLC % PRED: 74 %
TLC: 3.31 L

## 2016-06-28 MED ORDER — AMOXICILLIN-POT CLAVULANATE 875-125 MG PO TABS: 1.0000 | ORAL_TABLET | Freq: Two times a day (BID) | ORAL | 0 refills | Status: DC

## 2016-06-28 MED ORDER — AMOXICILLIN-POT CLAVULANATE 875-125 MG PO TABS: 1.0000 | ORAL_TABLET | Freq: Two times a day (BID) | ORAL | 0 refills | Status: AC

## 2016-06-28 NOTE — Progress Notes (Signed)
Subjective:    Patient ID: Jamie Cox, female    DOB: [birth year] .   MRN: [id number]   Brief patient profile:   64  yobf never smoker dx RA in her early 20's steroid dep x around 2000 but also mtx, plaquenil in past per Jamie Cox and also seen [facility] and also at [facility] with ? 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     History of Present Illness  August 10, 2010 1st pulmonary office eval cc for cough starting around 2006 but much worse x 4 months with constant need to clear the throat, mucus is clear, cough so hard looses breath. Much worse then ended in ER with dx of pna with cp on R rx as outpt with abx and cp now gone but still coughing. arthritis worse x 4 months and plans to go establish at [facility].  Imp was [place] plus/minus uacs  rec  1) Prednsione 10 mg 4 each am x 2days, 2x2days, 1x2days and stop and resume prednisone 2.5 mg daily  2) Stop boniva  3) Start Nexium Take one 30-60 min before first meal of the day and also Zantac 150 mg one at bedtime  4) GERD (REFLUX) diet   09/14/10  ov cc cough dry day = night,  pred at 2.5 mg per day cough was better on the higher doses but plan is to start plaquenil  per [facility].  Mostly dry.   >>Sinus CT pos+>Augmentin x 10d , zantac At bedtime        12/23/2010 ov/Jamie Cox  tapered per rheum to  2.5 prednisone per day,  One episode of cp post on R same location as before but not as intense and better since the z pack, not consistent with ppi ac but is holding boniva. Overall better but concerned cough still intermittently present. No purulent sputum. rec For cough take mucinex dm twice daily( 1200mg  every 12 hours) and if this fails use vicodin Protonix 40 mg Take 30-60 min before first meal of the day and Zantac at bedtime perfectly regularly for a month Please see patient coordinator before you leave today  to schedule for CT chest and sinus > improved  Sinus surgery in Jan 2014 > marked improvement         05/27/2016  Consultation/ re-establish  /Jamie Cox re: recurrent cough in pt with RA pred 2.5 mg daily  Chief Complaint  Patient presents with  . Pulm Consult    Productive cough x4 months ago. Cough has been getting worse. Cloudy white phlegm.   on qvar 40 on avg 2puff daily and not using saba appropriately/ poor hfa  Onset was insidious x 4 m prior to OV  With progressively worse 24/7 cough / very congested with grey mucus  Doe = MMRC2 = can't walk a nl pace on a flat grade s sob but does fine slow and flat eg shopping Cough worse with exp to fumes/hot air not better on qvar  - better in cooler conditions rec Stop qvar  Start symbicort 80 Take 2 puffs first thing in am and then another 2 puffs about 12 hours later.  Work on inhaler technique: Only use your albuterol as a rescue medication For cough > mucinex dm 1200 mg every 12 hours and the flutter valve as much as possible  Augmentin 875 mg take one pill twice daily  X 10 days -    06/28/2016  f/u ov/Jamie Cox re: bronchiectasis s significant airflow obst by pfts  And sinusitis by  ct  Chief Complaint  Patient presents with  . Follow-up    4 week follow up with PFT results. Still coughing.    worst time of cough at hs - overall at least 50% better, aware of purulent nasal discharge improves only while on augmentin and has not seen Jamie Cox in years / not needing any saba on symb 80 2bid     No obvious other patterns in  day to day or daytime variability or assoc  or mucus plugs or hemoptysis or cp or chest tightness, subjective wheeze or overt sinus or hb symptoms. No unusual exp hx or h/o childhood pna/ asthma or knowledge of premature birth.  Sleeping ok without nocturnal  or early am exacerbation  of respiratory  c/o's or need for noct saba. Also denies any obvious fluctuation of symptoms with weather or environmental changes or other aggravating or alleviating factors except as outlined above   Current Medications, Allergies, Complete Past Medical History, Past Surgical History,  Family History, and Social History were reviewed in [employer] record.  ROS  The following are not active complaints unless bolded sore throat, dysphagia, dental problems, itching, sneezing,  nasal congestion or excess/ purulent secretions, ear ache,   fever, chills, sweats, unintended wt loss, classically pleuritic or exertional cp,  orthopnea pnd or leg swelling, presyncope, palpitations, abdominal pain, anorexia, nausea, vomiting, diarrhea  or change in bowel or bladder habits, change in stools or urine, dysuria,hematuria,  rash, arthralgias, visual complaints, headache, numbness, weakness or ataxia or problems with walking or coordination,  change in mood/affect or memory.                     Objective:   Physical Exam  Chronically ill amb bf nad mod cushingnoid.  06/28/2016       98    05/27/16 98 lb 12.8 oz (44.8 kg)  05/09/16 101 lb 3.2 oz (45.9 kg)  05/04/16 100 lb (45.4 kg)   Wt 105 12/23/2010    Vital signs reviewed - Note on arrival 02 sats  96% on  RA      HEENT: nl dentition, turbinates, and oropharynx. Nl external ear canals without cough reflex  NECK : without JVD/Nodes/TM/ nl carotid upstrokes bilaterally  LUNGS: no acc muscle use,  Minimal insp and exp rhonchi bilaterally   CV: RRR no s3 or murmur or increase in P2, no edema .  ABD: soft and nontender with nl excursion in the supine position. 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 In the areas of greatest involvement  there is associated thickening of the peribronchovascular interstitium with some adjacent septal thickening, architectural distortion and peribronchovascular micro and macronodularity. Relative sparing of the mid to upper lungs      Assessment & Plan:

## 2016-06-28 NOTE — Progress Notes (Signed)
Spoke with pt and notified of results per Dr. Wert. Pt verbalized understanding and denied any questions. 

## 2016-06-28 NOTE — Patient Instructions (Addendum)
Augmentin 875 mg take one pill twice daily  X 20 days - take at breakfast and supper with large glass of water.  It would help reduce the usual side effects (diarrhea and yeast infections) if you ate cultured yogurt at lunch.   Please see patient coordinator before you leave today  to schedule  Evaluation by ENT   Dr Jenne Pane in 21 days, no sooner   Return here if not happy after you finish with Dr Jenne Pane recommendations

## 2016-06-29 DIAGNOSIS — J471 Bronchiectasis with (acute) exacerbation: Secondary | ICD-10-CM | POA: Insufficient documentation

## 2016-06-29 DIAGNOSIS — J479 Bronchiectasis, uncomplicated: Secondary | ICD-10-CM | POA: Insufficient documentation

## 2016-06-29 NOTE — Assessment & Plan Note (Signed)
See HRCT 06/28/2016 assoc with pansinusitis > referred to [facility]  - PFT's  06/28/2016   Nl obstruction p am symbicort 80 x 2   Considering the severity of her sinus dz she's actually doing well from a pulmonary perspective and based on previous response to sinus surgery would start back with Dr Jenne Pane and return here when rx is complete but in meantime no changes needed

## 2016-06-29 NOTE — Assessment & Plan Note (Signed)
CT sinus 09/14/10 Positive >10 d of Augmentin - Recheck sinus ct 12/24/10 > improved  - Improved p sinus surgery 06/2012  - PFT's  05/06/2016  FEV1 1.43 (79 % ) ratio 82  p 0 % improvement from saba p nothing prior to study with DLCO  45 % corrects to 51  % for alv volume   - 05/27/2016 re established rec augmentinx 10 days/ flutter valve training - 05/27/2016  After extensive coaching HFA effectiveness =    75% try symbicort 80 2bid  - CT sinus 06/28/2016 Short air-fluid levels within the maxillary sinuses bilaterally as well as sphenoid sinuses compatible with acute sinusitis rec augmentin x 20 days then  ent at 21 days Jenne Pane)  - PFT's  06/28/2016  FEV1 1.62 (90 % ) ratio 83  p 2 % improvement from saba p symbicort 80 x2 pffs prior to study with DLCO  71/69 % corrects to 106  % for alv volume    Difficult to tease out sinus dz from bronchiectasis but both are likely related to need fro chronic immunosuppression for RA and of the two I would address the sinusitis first.  I had an extended discussion with the patient reviewing all relevant studies completed to date and  lasting 15 to 20 minutes of a 25 minute visit    Each maintenance medication was reviewed in detail including most importantly the difference between maintenance and prns and under what circumstances the prns are to be triggered using an action plan format that is not reflected in the computer generated alphabetically organized AVS.    Please see AVS for specific instructions unique to this visit that I personally wrote and verbalized to the the pt in detail and then reviewed with pt  by my nurse highlighting any  changes in therapy recommended at today's visit to their plan of care.

## 2016-06-30 ENCOUNTER — Other Ambulatory Visit: Payer: Self-pay | Admitting: Family Medicine

## 2016-06-30 MED ORDER — METOPROLOL SUCCINATE ER 25 MG PO TB24: 25.0000 mg | ORAL_TABLET | Freq: Every day | ORAL | 3 refills | Status: DC

## 2016-06-30 NOTE — Telephone Encounter (Signed)
Patient is requesting a refill of metoprolol succinate (TOPROL-XL) 25 MG 24 hr tablet  Please advise  Pharmacy: [facility] [address] [place] - [address]

## 2016-06-30 NOTE — Telephone Encounter (Signed)
Last filled: 03/04/16 Amt: 30,3 Last OV: 06/09/16 Next appt: 09/19/16 Rx sent to pharmacy.

## 2016-07-01 ENCOUNTER — Other Ambulatory Visit: Payer: Self-pay | Admitting: Internal Medicine

## 2016-07-01 DIAGNOSIS — J328 Other chronic sinusitis: Secondary | ICD-10-CM

## 2016-07-12 ENCOUNTER — Telehealth: Payer: Self-pay | Admitting: Internal Medicine

## 2016-07-12 MED ORDER — AMOXICILLIN-POT CLAVULANATE 875-125 MG PO TABS: 1.0000 | ORAL_TABLET | Freq: Two times a day (BID) | ORAL | 0 refills | Status: DC

## 2016-07-12 NOTE — Telephone Encounter (Signed)
Spoke with patient, states that he recent abx that was sent in was only sent for 10 days and not 20 days as directed in last OV with MW. When reviewing chart, noticed that the last Rx was sent for the Augmentin 875 but was only sent with 10 days and instructions in AVS stated that take 1 twice daily for 20 days. Pt is requesting the other 10 days be sent to [facility]. This has been sent to [facility] and pt is aware per AVS notes to contact [facility] about referral to ENT the day after she completes the abx. Nothing further needed.

## 2016-07-20 ENCOUNTER — Inpatient Hospital Stay: Admission: RE | Admit: 2016-07-20 | Payer: BC Managed Care – PPO | Source: Ambulatory Visit

## 2016-07-22 ENCOUNTER — Other Ambulatory Visit: Payer: Self-pay | Admitting: Family Medicine

## 2016-08-03 DIAGNOSIS — J320 Chronic maxillary sinusitis: Secondary | ICD-10-CM | POA: Insufficient documentation

## 2016-08-03 DIAGNOSIS — T7840XA Allergy, unspecified, initial encounter: Secondary | ICD-10-CM | POA: Insufficient documentation

## 2016-08-15 ENCOUNTER — Telehealth: Payer: Self-pay | Admitting: Family Medicine

## 2016-08-15 DIAGNOSIS — M069 Rheumatoid arthritis, unspecified: Secondary | ICD-10-CM

## 2016-08-15 MED ORDER — MELOXICAM 7.5 MG PO TABS: ORAL_TABLET | ORAL | 3 refills | Status: DC

## 2016-08-15 MED ORDER — HYDROCODONE-ACETAMINOPHEN 5-325 MG PO TABS: 1.0000 | ORAL_TABLET | Freq: Four times a day (QID) | ORAL | 0 refills | Status: DC | PRN

## 2016-08-15 NOTE — Telephone Encounter (Signed)
Requesting:   Hydrocodone and Mobic Contract    03/31/2016 UDS     Low risk next is due 09/29/2016 Last OV   06/09/2016----next scheduled appt is on 09/19/2016 ast refill    #40 on 06/09/2016  Please Advise

## 2016-08-15 NOTE — Telephone Encounter (Signed)
meloxicam sent in Patient called to inform to pickup hardcopy at the front desk.

## 2016-08-15 NOTE — Telephone Encounter (Signed)
Caller name: Relationship to patient: Self Can be reached: [phone]  Pharmacy:  [facility] [address] [place] - [address] [phone] (Phone) [phone] (Fax)     Reason for call: Refill HYDROcodone-acetaminophen (NORCO/VICODIN) 5-325 MG tablet [[id number] meloxicam (MOBIC) 7.5 MG tablet [[id number]

## 2016-08-15 NOTE — Telephone Encounter (Signed)
OK to refill both meds requested 

## 2016-08-18 MED FILL — HYDROCODON-APAP 5-325: 5-325 | 10 days supply | Qty: 40 | Fill #0

## 2016-08-25 ENCOUNTER — Other Ambulatory Visit: Payer: Self-pay | Admitting: Family Medicine

## 2016-09-08 ENCOUNTER — Ambulatory Visit: Payer: Self-pay | Admitting: Allergy & Immunology

## 2016-09-19 ENCOUNTER — Ambulatory Visit: Payer: BC Managed Care – PPO | Admitting: Family Medicine

## 2016-09-27 ENCOUNTER — Other Ambulatory Visit: Payer: Self-pay | Admitting: Family Medicine

## 2016-09-27 ENCOUNTER — Telehealth: Payer: Self-pay | Admitting: Family Medicine

## 2016-09-27 DIAGNOSIS — M069 Rheumatoid arthritis, unspecified: Secondary | ICD-10-CM

## 2016-09-27 MED ORDER — HYDROCODONE-ACETAMINOPHEN 5-325 MG PO TABS: 1.0000 | ORAL_TABLET | Freq: Four times a day (QID) | ORAL | 0 refills | Status: DC | PRN

## 2016-09-27 NOTE — Telephone Encounter (Signed)
Printed /PCP signed and called the patient to inform to pickup hardcopy at the front desk.

## 2016-09-27 NOTE — Telephone Encounter (Signed)
°  Relation to MG:[id number] Call back [phone] Pharmacy:  Reason for call:  Patient requesting a refill HYDROcodone-acetaminophen (NORCO/VICODIN) 5-325 MG tablet

## 2016-09-27 NOTE — Telephone Encounter (Signed)
Requesting:  Hydrocodone Contract   03/31/2016 UDS   Low risk next is due on 09/29/2016 Last OV      06/09/2016-----future appt is on 10/21/2016 Last Refill       #40 no refills on 08/15/2016  Please Advise

## 2016-09-27 NOTE — Telephone Encounter (Signed)
OK to refill requested med 

## 2016-10-19 ENCOUNTER — Encounter (INDEPENDENT_AMBULATORY_CARE_PROVIDER_SITE_OTHER): Payer: Self-pay

## 2016-10-19 ENCOUNTER — Encounter: Payer: Self-pay | Admitting: Allergy & Immunology

## 2016-10-19 ENCOUNTER — Ambulatory Visit (INDEPENDENT_AMBULATORY_CARE_PROVIDER_SITE_OTHER): Payer: BC Managed Care – PPO | Admitting: Allergy & Immunology

## 2016-10-19 VITALS — BP 116/68 | HR 93 | Temp 98.0°F | Resp 18 | Ht 60.0 in | Wt 102.6 lb

## 2016-10-19 DIAGNOSIS — M069 Rheumatoid arthritis, unspecified: Secondary | ICD-10-CM

## 2016-10-19 DIAGNOSIS — J4540 Moderate persistent asthma, uncomplicated: Secondary | ICD-10-CM | POA: Diagnosis not present

## 2016-10-19 DIAGNOSIS — L239 Allergic contact dermatitis, unspecified cause: Secondary | ICD-10-CM | POA: Diagnosis not present

## 2016-10-19 DIAGNOSIS — J310 Chronic rhinitis: Secondary | ICD-10-CM

## 2016-10-19 MED ORDER — TRIAMCINOLONE ACETONIDE 0.1 % EX OINT
1.0000 | TOPICAL_OINTMENT | Freq: Two times a day (BID) | CUTANEOUS | 5 refills | Status: DC
Start: 2016-10-19 — End: 2017-08-29

## 2016-10-19 NOTE — Patient Instructions (Addendum)
1. Chronic rhinitis - We cannot do testing today because you have take Claritin.  - Stop the fluticasone and start Dymista two sprays per nostril daily. - Use nasal saline rinses prior to using the Dymista.  - Start the prednisone pack provided today: 10mg  twice daily for four days and then 10mg  once on the fifth day - Once we do testing, we can discuss further management strategies.   2. Moderate persistent asthma, uncomplicated - Lung testing showed  - We will defer to Dr. for management of your asthma and cough.  - Continue with the Symbicort two puffs twice daily. - Use a spacer to improve delivery of the Symbicort into your lungs.   3. Allergic contact dermatitis - Continue to use gloves with the cleaning products.  - We can do patch testing in the future if you are interested in figuring out which chemical is causing your reaction. - In the meantime, we will send in a prescription for triamcinolone ointment to use twice daily during flares.   4. Return in about 5 days (around 10/24/2016) at 1:30pm for skin testing.   Please inform Sherene Sires of any Emergency Department visits, hospitalizations, or changes in symptoms. Call 10/26/2016 before going to the ED for breathing or allergy symptoms since we might be able to fit you in for a sick visit. Feel free to contact us anytime with any questions, problems, or concerns.  It was a pleasure to meet you today! Happy spring!   Websites that have reliable patient information: 1. American Academy of Asthma, Allergy, and Immunology: www.aaaai.org 2. Food Allergy Research and Education (FARE): foodallergy.org 3. Mothers of Asthmatics: http://www.asthmacommunitynetwork.org 4. American College of Allergy, Asthma, and Immunology: www.acaai.org d

## 2016-10-19 NOTE — Progress Notes (Signed)
NEW PATIENT  Date of Service/Encounter:  10/19/16  Referring provider: [name], MD   Assessment:   Chronic rhinitis  Moderate persistent asthma, uncomplicated  Allergic contact dermatitis  Rheumatoid arthritis    Asthma Reportables:  Severity: moderate persistent  Risk: high Control: not well controlled    Plan/Recommendations:   1. Chronic rhinitis - We cannot do testing today because you have take Claritin.  - Stop the fluticasone and start Dymista two sprays per nostril daily. - Use nasal saline rinses prior to using the Dymista.  - Start the prednisone pack provided today: 10mg  twice daily for four days and then 10mg  once on the fifth day - Once we do testing, we can discuss further management strategies.   2. Moderate persistent asthma, uncomplicated - Lung testing showed  - We will defer to Dr. for management of your asthma and cough.  - Continue with the Symbicort two puffs twice daily. - Use a spacer to improve delivery of the Symbicort into your lungs.   3. Allergic contact dermatitis - Continue to use gloves with the cleaning products.  - We can do patch testing in the future if you are interested in figuring out which chemical is causing your reaction. - In the meantime, we will send in a prescription for triamcinolone ointment to use twice daily during flares.   4. Return in about 5 days (around 10/24/2016) at 1:30pm for skin testing.    Subjective:   Jamie Cox is a 56 y.o. female presenting today for evaluation of  Chief Complaint  Patient presents with  . Cough  . Urticaria    Jamie Cox has a history of the following: Patient Active Problem List   Diagnosis Date Noted  . Bronchiectasis without acute exacerbation (HCC) 06/29/2016  . 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 Cervical cancer screening 03/04/2016  . Hypercalcemia 07/19/2015  . History of chicken pox   . Intercostal muscle pain 09/20/2013  .  Preventative health care 08/06/2013  . GERD (gastroesophageal reflux disease) 08/01/2012  . Tachycardia 07/28/2012  . Health care maintenance 01/31/2012  . Chronic sinusitis 01/31/2012  . Elevated blood pressure 01/31/2012  . 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 Chronic cough 09/06/2010  . Rheumatoid arthritis (HCC) 05/17/2007  . Deficiency anemia 06/23/2006  . Osteoporosis 06/23/2006    History obtained from: chart review and patient.  Jamie Cox was referred by 06/25/2006, MD.     Jamie Cox is a 56 y.o. female presenting for an allergy evaluation. She initially developed sinus issues with post nasal drip in the early 2000s. She did have sinus surgery in 2010 that improved the symptoms, but then her symptoms slowly worsened. Her ENT is Dr. 07-[birth year]-1973. Her last appointment with him was two months ago. He recommended that she have allergy testing performed. [name] grew up in [place] but has been here for 24 years.    [name] reports that she has had symptoms since moving here 24 years ago. She endorses runny nose, coughing, and itchy watery eyes. She was on Claritin but now uses Allegra. She is on fluticasone two sprays per nostril daily. Despite the medications, she continues to have problems. This year has been particularly bad. She is having full blown symptoms at this time. Last year she did have some coughing but it was not as bad as this year. She has never had allergy testing. She has recurrent courses of antibiotics for sinusitis.  . She has  had a cough for years. She does see Dr. Sherene Sires who started her on Symbicort within the last six months. She uses two puffs twice daily but she is not good about using it regularly. She does feel better when she uses it more routinely. She does not use a spacer at all. She has been to the ED once for breathing treatments but she has never been hospitalized.    She also reports a history of a rash. 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 She is exposed to a large number of chemicals in this line of work. Certain chemicals seem to cause her to have dermatitis flares. She describes it as red, irritated, and pruritic.  She uses OTC hydrocortisone which does help somewhat.   [name] does have a history of RA and is on prednisone 2.5mg  daily as well as methotrexate. She is followed by Dr. Dillard Cannon. [place]. Her last appointment with Dr. Maple Hudson was one month ago. 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 She has a history of anemia.   Otherwise, there is no history of other atopic diseases, including drug allergies, food allergies, stinging insect allergies, or urticaria. There is no significant infectious history aside from recurrent sinusitis. Vaccinations are up to date.    Past Medical History: Patient Active Problem List   Diagnosis Date Noted  . Bronchiectasis without acute exacerbation (HCC) 06/29/2016  . 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 Cervical cancer screening 03/04/2016  . Hypercalcemia 07/19/2015  . History of chicken pox   . Intercostal muscle pain 09/20/2013  . Preventative health care 08/06/2013  . GERD (gastroesophageal reflux disease) 08/01/2012  . Tachycardia 07/28/2012  . Health care maintenance 01/31/2012  . Chronic sinusitis 01/31/2012  . Elevated blood pressure 01/31/2012  . 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 Chronic cough 09/06/2010  . Rheumatoid arthritis (HCC) 05/17/2007  . Deficiency anemia 06/23/2006  . Osteoporosis 06/23/2006    Medication List:  Allergies as of 10/19/2016      Reactions   Avelox [moxifloxacin Hcl In Nacl] Diarrhea, Nausea And Vomiting, Other (See Comments)   Dizziness, Cold chills    Bactrim [sulfamethoxazole-trimethoprim] Hives   Other Diarrhea, Nausea And Vomiting, Other (See Comments)   Dizziness, Cold chills    Sulfamethoxazole-trimethoprim Hives      Medication List         Accurate as of 10/19/16  1:39 PM. Always use your most recent med list.          acetaminophen 650 MG CR tablet Commonly known as:  TYLENOL Take 650 mg by mouth every 8 (eight) hours as needed. For pain   albuterol 108 (90 Base) MCG/ACT inhaler Commonly known as:  PROVENTIL HFA;VENTOLIN HFA Inhale 1-2 puffs into the lungs every 6 (six) hours as needed for wheezing or shortness of breath (cough). For shortness of breath/wheezing   budesonide-formoterol 80-4.5 MCG/ACT inhaler Commonly known as:  SYMBICORT Inhale 2 puffs into the lungs 2 (two) times daily.   CALCIUM 500/D 500-200 MG-UNIT tablet Generic drug:  calcium-vitamin D 1 tablet.   fluticasone 50 MCG/ACT nasal spray Commonly known as:  FLONASE Place 2 sprays into both nostrils daily.   folic acid 1 MG tablet Commonly known as:  FOLVITE   HEMOCYTE-F 324-1 MG Tabs Generic drug:  Ferrous Fumarate-Folic Acid TAKE ONE TABLET BY MOUTH ONCE DAILY   HYDROcodone-acetaminophen 5-325 MG tablet Commonly known as:  NORCO/VICODIN Take 1 tablet  by mouth every 6 (six) hours as needed for moderate pain.   hyoscyamine 0.125 MG tablet Commonly known as:  LEVSIN, ANASPAZ Take 1 tablet (0.125 mg total) by mouth every 4 (four) hours as needed.   loratadine 10 MG tablet Commonly known as:  CLARITIN 10 mg.   meloxicam 7.5 MG tablet Commonly known as:  MOBIC Take 1-2 tablets by mouth daily as needed for pain.   methotrexate 2.5 MG tablet   metoprolol succinate 25 MG 24 hr tablet Commonly known as:  TOPROL-XL Take 1 tablet (25 mg total) by mouth daily.   montelukast 10 MG tablet Commonly known as:  SINGULAIR TAKE ONE TABLET BY MOUTH AT BEDTIME   multivitamin capsule Take 1 capsule by mouth daily.   pantoprazole 40 MG tablet Commonly known as:  PROTONIX Take 1 tablet (40 mg total) by mouth daily.   predniSONE 2.5 MG tablet Commonly known as:  DELTASONE TAKE ONE TABLET BY MOUTH ONCE DAILY WITH BREAKFAST   ranitidine  300 MG tablet Commonly known as:  ZANTAC TAKE ONE-HALF TABLET BY MOUTH AT BEDTIME       Birth History: non-contributory.    Developmental History: non-contributory.    Past Surgical History: Past Surgical History:  Procedure Laterality Date  . NASAL SINUS SURGERY  06/2012     Family History: Family History  Problem Relation Age of Onset  . Liver cancer Maternal Grandmother   . Hypertension Maternal Grandmother   . Heart attack Maternal Grandmother   . Scleroderma Mother   . Heart disease Mother   . Kidney disease Mother   . Prostate cancer Father   . Diabetes Father   . Heart disease Father        CHF  . Arthritis Father        s/p hip replacement  . Cancer Father        lung cancer with mets, smoker  . Lung cancer Paternal Uncle   . Coronary artery disease Paternal Grandmother   . Hypertension Paternal Grandmother   . Liver cancer Paternal Grandmother   . Hyperlipidemia Son   . Alzheimer's disease Paternal Grandfather   . Other Sister        Cardiomegaly  . Arthritis Sister   . Heart disease Sister        rare  . Sleep apnea Brother   . Arthritis Daughter   . Lupus Cousin   . Arthritis Cousin   . Colon cancer Neg Hx      Social History: Rekha lives at home with her husband, two older children, and two grandchildren. She lives in a rental house. There is mild throughout the home. She has electric heating and central cooling. There are no animals inside or outside of the home. She does not have dust mite coverings on her bedding. There is no tobacco exposure. She has never been a smoker at all.     Review of Systems: a 14-point review of systems is pertinent for what is mentioned in HPI.  Otherwise, all other systems were negative. Constitutional: negative other than that listed in the HPI Eyes: negative other than that listed in the HPI Ears, nose, mouth, throat, and face: negative other than that listed in the HPI Respiratory: negative other than  that listed in the HPI Cardiovascular: negative other than that listed in the HPI Gastrointestinal: negative other than that listed in the HPI Genitourinary: negative other than that listed in the HPI Integument: negative other than that listed in the  HPI Hematologic: negative other than that listed in the HPI Musculoskeletal: negative other than that listed in the HPI Neurological: negative other than that listed in the HPI Allergy/Immunologic: negative other than that listed in the HPI    Objective:   Blood pressure 116/68, pulse 93, temperature 98 F (36.7 C), temperature source Oral, resp. rate 18, height 5' (1.524 m), weight 102 lb 9.6 oz (46.5 kg), SpO2 99 %. Body mass index is 20.04 kg/m.   Physical Exam:  General: Alert, interactive, in mild acute distress. Thin.  Eyes: No conjunctival injection present on the right, No conjunctival injection present on the left, PERRL bilaterally, No discharge on the right, No discharge on the left and No Horner-Trantas dots present Ears: Right TM unable to be visualized due to cerumen impaction and Left TM unable to be visualized due to cerumen impaction.  Nose/Throat: External nose within normal limits, nasal crease present and septum midline, turbinates markedly edematous and pale with clear discharge, post-pharynx markedly erythematous with cobblestoning in the posterior oropharynx. Tonsils 2+ without exudates Neck: Supple without thyromegaly.  Adenopathy: no enlarged lymph nodes appreciated in the anterior cervical, occipital, axillary, epitrochlear, inguinal, or popliteal regions Lungs: Decreased breath sounds with expiratory wheezing bilaterally. Increased work of breathing. CV: Normal S1/S2, no murmurs. Capillary refill <2 seconds.  Abdomen: Nondistended, nontender. No guarding or rebound tenderness. Bowel sounds faint and present in all fields  Skin: Warm and dry, without lesions or rashes. Extremities:  No clubbing, cyanosis or  edema. Neuro:   Grossly intact. No focal deficits appreciated. Responsive to questions.  Diagnostic studies:  Spirometry: results abnormal (FEV1: 1.33/74%, FVC: 1.65/75%, FEV1/FVC: 80%).    Spirometry consistent with possible restrictive disease. Albuterol/Atrovent nebulizer treatment given in clinic with significant improvement in the FVC of (12%).   Allergy Studies: deferred because patient took antihistamines      Malachi Bonds, MD [facility]

## 2016-10-21 ENCOUNTER — Encounter: Payer: Self-pay | Admitting: Family Medicine

## 2016-10-21 ENCOUNTER — Ambulatory Visit (INDEPENDENT_AMBULATORY_CARE_PROVIDER_SITE_OTHER): Payer: BC Managed Care – PPO | Admitting: Family Medicine

## 2016-10-21 DIAGNOSIS — M069 Rheumatoid arthritis, unspecified: Secondary | ICD-10-CM

## 2016-10-21 DIAGNOSIS — M0510 Rheumatoid lung disease with rheumatoid arthritis of unspecified site: Secondary | ICD-10-CM

## 2016-10-21 DIAGNOSIS — M810 Age-related osteoporosis without current pathological fracture: Secondary | ICD-10-CM | POA: Diagnosis not present

## 2016-10-21 DIAGNOSIS — R000 Tachycardia, unspecified: Secondary | ICD-10-CM

## 2016-10-21 MED ORDER — HYDROCODONE-ACETAMINOPHEN 5-325 MG PO TABS: 1.0000 | ORAL_TABLET | Freq: Four times a day (QID) | ORAL | 0 refills | Status: DC | PRN

## 2016-10-21 MED ORDER — METOPROLOL SUCCINATE ER 25 MG PO TB24: 25.0000 mg | ORAL_TABLET | Freq: Every day | ORAL | 5 refills | Status: DC

## 2016-10-21 MED ORDER — METOPROLOL SUCCINATE ER 25 MG PO TB24: 25.0000 mg | ORAL_TABLET | Freq: Every day | ORAL | 3 refills | Status: DC

## 2016-10-21 NOTE — Progress Notes (Signed)
Subjective:  I acted as a Neurosurgeon for Dr. Abner Greenspan. [place], [place]  Patient ID: Jamie Cox, female    DOB: 1960/06/18, 56 y.o.   MRN: [id number]  No chief complaint on file.   HPI  Patient is in today for a 3 month follow up. She denies anyt recent febrile illness Or hospitalizations. She continues to struggle with significant congestion and cough. Is currently following with pulmonology, ENT and allergist. Is due for further testing next week. She continues to work long hours on her feet and gets pain relief from her medications. Denies CP/palp/SOB/HA/congestion/fevers/GI or GU c/o. Taking meds as prescribed  Patient Care Team: [name], MD as PCP - General (Family Medicine) Mardella Layman, MD as Consulting Physician (Gastroenterology)   Past Medical History:  Diagnosis Date  . Asthma   . Chronic sinusitis    Followed by Dr. Jenne Pane  . Environmental allergies   . GERD (gastroesophageal reflux disease)   . 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 History of chicken pox   . Hypercalcemia 07/19/2015  . Osteoporosis, unspecified    steroid induced  . Personal history of other diseases of circulatory system   . Rheumatoid arthritis(714.0)   . Unspecified deficiency anemia    microcytic  . Urticaria     Past Surgical History:  Procedure Laterality Date  . NASAL SINUS SURGERY  06/2012    Family History  Problem Relation Age of Onset  . Liver cancer Maternal Grandmother   . Hypertension Maternal Grandmother   . Heart attack Maternal Grandmother   . Scleroderma Mother   . Heart disease Mother   . Kidney disease Mother   . Prostate cancer Father   . Diabetes Father   . Heart disease Father        CHF  . Arthritis Father        s/p hip replacement  . Cancer Father        lung cancer with mets, smoker  . Lung cancer Paternal Uncle   . Coronary artery disease Paternal Grandmother   . Hypertension Paternal Grandmother   . Liver cancer Paternal Grandmother    . Hyperlipidemia Son   . Alzheimer's disease Paternal Grandfather   . Other Sister        Cardiomegaly  . Arthritis Sister   . Heart disease Sister        rare  . Sleep apnea Brother   . Arthritis Daughter   . Lupus Cousin   . Arthritis Cousin   . Colon cancer Neg Hx     Social History   Social History  . Marital status: Married    Spouse name: N/A  . Number of children: 2  . Years of education: N/A   Occupational History  . Data processing manager    Social History Main Topics  . Smoking status: Never Smoker  . Smokeless tobacco: Never Used  . Alcohol use No  . Drug use: No  . Sexual activity: Not Currently   Other Topics Concern  . Not on file   Social History Narrative  . No narrative on file    Outpatient Medications Prior to Visit  Medication Sig Dispense Refill  . acetaminophen (TYLENOL) 650 MG CR tablet Take 650 mg by mouth every 8 (eight) hours as needed. For pain    . albuterol (PROVENTIL HFA;VENTOLIN HFA) 108 (90 Base) MCG/ACT inhaler Inhale 1-2 puffs into the lungs every 6 (six) hours as needed for wheezing or shortness of breath (  cough). For shortness of breath/wheezing 3.7 g 1  . budesonide-formoterol (SYMBICORT) 80-4.5 MCG/ACT inhaler Inhale 2 puffs into the lungs 2 (two) times daily. 1 Inhaler 3  . calcium-vitamin D (CALCIUM 500/D) 500-200 MG-UNIT per tablet 1 tablet.    . fluticasone (FLONASE) 50 MCG/ACT nasal spray Place 2 sprays into both nostrils daily. 16 g 3  . folic acid (FOLVITE) 1 MG tablet     . HEMOCYTE-F 324-1 MG TABS TAKE ONE TABLET BY MOUTH ONCE DAILY 30 each 5  . hyoscyamine (LEVSIN, ANASPAZ) 0.125 MG tablet Take 1 tablet (0.125 mg total) by mouth every 4 (four) hours as needed. 30 tablet 0  . loratadine (CLARITIN) 10 MG tablet 10 mg.    . meloxicam (MOBIC) 7.5 MG tablet Take 1-2 tablets by mouth daily as needed for pain. 60 tablet 3  . methotrexate 2.5 MG tablet     . montelukast (SINGULAIR) 10 MG tablet TAKE ONE TABLET BY MOUTH AT  BEDTIME 30 tablet 5  . Multiple Vitamin (MULTIVITAMIN) capsule Take 1 capsule by mouth daily.      . pantoprazole (PROTONIX) 40 MG tablet Take 1 tablet (40 mg total) by mouth daily. 90 tablet 1  . predniSONE (DELTASONE) 2.5 MG tablet TAKE ONE TABLET BY MOUTH ONCE DAILY WITH BREAKFAST 90 tablet 1  . ranitidine (ZANTAC) 300 MG tablet TAKE ONE-HALF TABLET BY MOUTH AT BEDTIME 90 tablet 1  . triamcinolone ointment (KENALOG) 0.1 % Apply 1 application topically 2 (two) times daily. 30 g 5  . HYDROcodone-acetaminophen (NORCO/VICODIN) 5-325 MG tablet Take 1 tablet by mouth every 6 (six) hours as needed for moderate pain. 40 tablet 0  . metoprolol succinate (TOPROL-XL) 25 MG 24 hr tablet Take 1 tablet (25 mg total) by mouth daily. 30 tablet 3   No facility-administered medications prior to visit.     Allergies  Allergen Reactions  . Avelox [Moxifloxacin Hcl In Nacl] Diarrhea, Nausea And Vomiting and Other (See Comments)    Dizziness, Cold chills   . Bactrim [Sulfamethoxazole-Trimethoprim] Hives  . Other Diarrhea, Nausea And Vomiting and Other (See Comments)    Dizziness, Cold chills   . Sulfamethoxazole-Trimethoprim Hives    Review of Systems  Constitutional: Negative for fever and malaise/fatigue.  HENT: Positive for congestion.   Eyes: Negative for blurred vision.  Respiratory: Positive for cough. Negative for shortness of breath.   Cardiovascular: Negative for chest pain, palpitations and leg swelling.  Gastrointestinal: Negative for vomiting.  Musculoskeletal: Positive for back pain and joint pain.  Skin: Negative for rash.  Neurological: Negative for loss of consciousness and headaches.       Objective:    Physical Exam  Constitutional: She is oriented to person, place, and time. She appears well-developed and well-nourished. No distress.  HENT:  Head: Normocephalic and atraumatic.  Eyes: Conjunctivae are normal.  Neck: Normal range of motion. No thyromegaly present.    Cardiovascular: Normal rate and regular rhythm.   Pulmonary/Chest: Effort normal and breath sounds normal. She has no wheezes.  Abdominal: Soft. Bowel sounds are normal. There is no tenderness.  Musculoskeletal: Normal range of motion. She exhibits no edema or deformity.  Neurological: She is alert and oriented to person, place, and time.  Skin: Skin is warm and dry. She is not diaphoretic.  Psychiatric: She has a normal mood and affect.    BP (!) 102/92 (BP Location: Left Arm, Patient Position: Sitting, Cuff Size: Normal)   Pulse 60   Temp 98 F (36.7 C) (Other (Comment))  Resp 18   Wt 103 lb 9.6 oz (47 kg)   BMI 20.23 kg/m  Wt Readings from Last 3 Encounters:  10/21/16 103 lb 9.6 oz (47 kg)  10/19/16 102 lb 9.6 oz (46.5 kg)  06/28/16 98 lb 6.4 oz (44.6 kg)   BP Readings from Last 3 Encounters:  10/21/16 (!) 102/92  10/19/16 116/68  06/28/16 118/72     Immunization History  Administered Date(s) Administered  . Influenza Split 04/05/2011  . Influenza Whole 04/10/2008, 05/15/2009, 05/21/2010, 02/05/2012  . Influenza,inj,Quad PF,36+ Mos 08/06/2013, 05/09/2016  . Pneumococcal Polysaccharide-23 07/01/2010  . Td 05/15/2009    Health Maintenance  Topic Date Due  . INFLUENZA VACCINE  01/04/2017  . MAMMOGRAM  08/06/2017  . PAP SMEAR  03/05/2019  . TETANUS/TDAP  05/16/2019  . COLONOSCOPY  10/16/2022  . Hepatitis C Screening  Completed  . HIV Screening  Completed    Lab Results  Component Value Date   WBC 9.6 06/09/2016   HGB 11.0 (L) 06/09/2016   HCT 35.1 (L) 06/09/2016   PLT 282.0 06/09/2016   GLUCOSE 86 06/09/2016   CHOL 190 07/13/2015   TRIG 111.0 07/13/2015   HDL 73.90 07/13/2015   LDLCALC 94 07/13/2015   ALT 15 06/09/2016   AST 19 06/09/2016   NA 138 06/09/2016   K 3.7 06/09/2016   CL 102 06/09/2016   CREATININE 0.80 06/09/2016   BUN 17 06/09/2016   CO2 31 06/09/2016   TSH 1.47 06/09/2016    Lab Results  Component Value Date   TSH 1.47 06/09/2016    Lab Results  Component Value Date   WBC 9.6 06/09/2016   HGB 11.0 (L) 06/09/2016   HCT 35.1 (L) 06/09/2016   MCV 72.4 (L) 06/09/2016   PLT 282.0 06/09/2016   Lab Results  Component Value Date   NA 138 06/09/2016   K 3.7 06/09/2016   CO2 31 06/09/2016   GLUCOSE 86 06/09/2016   BUN 17 06/09/2016   CREATININE 0.80 06/09/2016   BILITOT 0.2 06/09/2016   ALKPHOS 53 06/09/2016   AST 19 06/09/2016   ALT 15 06/09/2016   PROT 8.5 (H) 06/09/2016   ALBUMIN 4.2 06/09/2016   CALCIUM 10.1 06/09/2016   CALCIUM 9.8 06/09/2016   GFR 94.83 06/09/2016   Lab Results  Component Value Date   CHOL 190 07/13/2015   Lab Results  Component Value Date   HDL 73.90 07/13/2015   Lab Results  Component Value Date   LDLCALC 94 07/13/2015   Lab Results  Component Value Date   TRIG 111.0 07/13/2015   Lab Results  Component Value Date   CHOLHDL 3 07/13/2015   No results found for: HGBA1C       Assessment & Plan:   Problem List Items Addressed This Visit    Rheumatoid arthritis (HCC)    Continues to work full time and has to spemd many hours on her feet each shift she gets relief from occasional Hydrocodone use, allowed a refill      Relevant Medications   HYDROcodone-acetaminophen (NORCO/VICODIN) 5-325 MG tablet   HYDROcodone-acetaminophen (NORCO/VICODIN) 5-325 MG tablet   HYDROcodone-acetaminophen (NORCO) 5-325 MG tablet   Osteoporosis    Encouraged to get adequate exercise, calcium and vitamin d intake      Rheumatoid lung disease (HCC)    Follows closely with pulmonology and continues to struggle. She has been seen by ENT, DR Jenne Pane and was referred to Allergist Dr Dellis Anes she has an appt next week with allergist.  Then she will follow up with pulmonology.      Tachycardia    RRR today         I have changed Ms. Yaklin HYDROcodone-acetaminophen and HYDROcodone-acetaminophen. I am also having her start on HYDROcodone-acetaminophen. Additionally, I am having her  maintain her acetaminophen, multivitamin, loratadine, calcium-vitamin D, predniSONE, fluticasone, pantoprazole, hyoscyamine, albuterol, budesonide-formoterol, HEMOCYTE-F, meloxicam, ranitidine, montelukast, folic acid, methotrexate, triamcinolone ointment, and metoprolol succinate.  Meds ordered this encounter  Medications  . DISCONTD: metoprolol succinate (TOPROL-XL) 25 MG 24 hr tablet    Sig: Take 1 tablet (25 mg total) by mouth daily.    Dispense:  30 tablet    Refill:  3  . metoprolol succinate (TOPROL-XL) 25 MG 24 hr tablet    Sig: Take 1 tablet (25 mg total) by mouth daily.    Dispense:  30 tablet    Refill:  5  . HYDROcodone-acetaminophen (NORCO/VICODIN) 5-325 MG tablet    Sig: Take 1 tablet by mouth every 6 (six) hours as needed for moderate pain. June 2018    Dispense:  40 tablet    Refill:  0  . HYDROcodone-acetaminophen (NORCO/VICODIN) 5-325 MG tablet    Sig: Take 1 tablet by mouth every 6 (six) hours as needed for moderate pain. July 2018    Dispense:  40 tablet    Refill:  0  . HYDROcodone-acetaminophen (NORCO) 5-325 MG tablet    Sig: Take 1 tablet by mouth every 6 (six) hours as needed for moderate pain. August 2018    Dispense:  40 tablet    Refill:  0    CMA served as Neurosurgeon during this visit. History, Physical and Plan performed by medical provider. Documentation and orders reviewed and attested to.  Danise Edge, MD

## 2016-10-21 NOTE — Patient Instructions (Signed)
Elderberry liquid for cough  Allergies, Adult An allergy is when your body's defense system (immune system) overreacts to an otherwise harmless substance (allergen) that you breathe in or eat or something that touches your skin. When you come into contact with something that you are allergic to, your immune system produces certain proteins (antibodies). These proteins cause cells to release chemicals (histamines) that trigger the symptoms of an allergic reaction. Allergies often affect the nasal passages (allergic rhinitis), eyes (allergic conjunctivitis), skin (atopic dermatitis), and stomach. Allergies can be mild or severe. Allergies cannot spread from person to person (are not contagious). They can develop at any age and may be outgrown. What are the causes? Allergies can be caused by any substance that your immune system mistakenly targets as harmful. These may include:  Outdoor allergens, such as pollen, grass, weeds, car exhaust, and mold spores.  Indoor allergens, such as dust, smoke, mold, and pet dander.  Foods, especially peanuts, milk, eggs, fish, shellfish, soy, nuts, and wheat.  Medicines, such as penicillin.  Skin irritants, such as detergents, chemicals, and latex.  Perfume.  Insect bites or stings. What increases the risk? You may be at greater risk of allergies if other people in your family have allergies. What are the signs or symptoms? Symptoms depend on what type of allergy you have. They may include:  Runny, stuffy nose.  Sneezing.  Itchy mouth, ears, or throat.  Postnasal drip.  Sore throat.  Itchy, red, watery, or puffy eyes.  Skin rash or hives.  Stomach pain.  Vomiting.  Diarrhea.  Bloating.  Wheezing or coughing. People with a severe allergy to food, medicine, or an insect bite may have a life-threatening allergic reaction (anaphylaxis). Symptoms of anaphylaxis include:  Hives.  Itching.  Flushed face.  Swollen lips, tongue, or  mouth.  Tight or swollen throat.  Chest pain or tightness in the chest.  Trouble breathing or shortness of breath.  Rapid heartbeat.  Dizziness or fainting.  Vomiting.  Diarrhea.  Pain in the abdomen. How is this diagnosed? This condition is diagnosed based on:  Your symptoms.  Your family and medical history.  A physical exam. You may need to see a health care provider who specializes in treating allergies (allergist). You may also have tests, including:  Skin tests to see which allergens are causing your symptoms, such as:  Skin prick test. In this test, your skin is pricked with a tiny needle and exposed to small amounts of possible allergens to see if your skin reacts.  Intradermal skin test. In this test, a small amount of allergen is injected under your skin to see if your skin reacts.  Patch test. In this test, a small amount of allergen is placed on your skin and then your skin is covered with a bandage. Your health care provider will check your skin after a couple of days to see if a rash has developed.  Blood tests.  Challenges tests. In this test, you inhale a small amount of allergen by mouth to see if you have an allergic reaction. You may also be asked to:  Keep a food diary. A food diary is a record of all the foods and drinks you have in a day and any symptoms you experience.  Practice an elimination diet. An elimination diet involves eliminating specific foods from your diet and then adding them back in one by one to find out if a certain food causes an allergic reaction. How is this treated? Treatment for  allergies depends on your symptoms. Treatment may include:  Cold compresses to soothe itching and swelling.  Eye drops.  Nasal sprays.  Using a saline spray or container (neti pot) to flush out the nose (nasal irrigation). These methods can help clear away mucus and keep the nasal passages moist.  Using a humidifier.  Oral antihistamines or  other medicines to block allergic reaction and inflammation.  Skin creams to treat rashes or itching.  Diet changes to eliminate food allergy triggers.  Repeated exposure to tiny amounts of allergens to build up a tolerance and prevent future allergic reactions (immunotherapy). These include:  Allergy shots.  Oral treatment. This involves taking small doses of an allergen under the tongue (sublingual immunotherapy).  Emergency epinephrine injection (auto-injector) in case of an allergic emergency. This is a self-injectable, pre-measured medicine that must be given within the first few minutes of a serious allergic reaction. Follow these instructions at home:  Avoid known allergens whenever possible.  If you suffer from airborne allergens, wash out your nose daily. You can do this with a saline spray or a neti pot to flush out your nose (nasal irrigation).  Take over-the-counter and prescription medicines only as told by your health care provider.  Keep all follow-up visits as told by your health care provider. This is important.  If you are at risk of a severe allergic reaction (anaphylaxis), keep your auto-injector with you at all times.  If you have ever had anaphylaxis, wear a medical alert bracelet or necklace that states you have a severe allergy. Contact a health care provider if:  Your symptoms do not improve with treatment. Get help right away if:  You have symptoms of anaphylaxis, such as:  Swollen mouth, tongue, or throat.  Pain or tightness in your chest.  Trouble breathing or shortness of breath.  Dizziness or fainting.  Severe abdominal pain, vomiting, or diarrhea. This information is not intended to replace advice given to you by your health care provider. Make sure you discuss any questions you have with your health care provider. Document Released: 08/16/2002 Document Revised: 01/21/2016 Document Reviewed: 12/09/2015 Elsevier Interactive Patient Education   2017 [employer].

## 2016-10-23 NOTE — Assessment & Plan Note (Signed)
RRR today 

## 2016-10-23 NOTE — Assessment & Plan Note (Signed)
Continues to work full time and has to spemd many hours on her feet each shift she gets relief from occasional Hydrocodone use, allowed a refill

## 2016-10-23 NOTE — Assessment & Plan Note (Signed)
Follows closely with pulmonology and continues to struggle. She has been seen by ENT, DR Jenne Pane and was referred to Allergist Dr Dellis Anes she has an appt next week with allergist. Then she will follow up with pulmonology.

## 2016-10-23 NOTE — Assessment & Plan Note (Signed)
Encouraged to get adequate exercise, calcium and vitamin d intake 

## 2016-10-24 ENCOUNTER — Ambulatory Visit (INDEPENDENT_AMBULATORY_CARE_PROVIDER_SITE_OTHER): Payer: BC Managed Care – PPO | Admitting: Allergy & Immunology

## 2016-10-24 ENCOUNTER — Encounter: Payer: Self-pay | Admitting: Allergy & Immunology

## 2016-10-24 VITALS — BP 122/76 | HR 76 | Resp 16

## 2016-10-24 DIAGNOSIS — J310 Chronic rhinitis: Secondary | ICD-10-CM

## 2016-10-24 NOTE — Patient Instructions (Addendum)
1. Chronic rhinitis - Testing was difficult to interpret because your histamine was negative.  - We will get blood work to look for evidence of environmental allergens.  - Continue with Dymista two sprays per nostril daily. - Continue with Allegra 180mg  daily. - Once we have the lab results, we can discuss more options to treat your symptoms, including allergy shots.   2. Return in about 3 months (around 01/24/2017).  Please inform 01/26/2017 of any Emergency Department visits, hospitalizations, or changes in symptoms. Call us before going to the ED for breathing or allergy symptoms since we might be able to fit you in for a sick visit. Feel free to contact us anytime with any questions, problems, or concerns.  It was a pleasure to see you again today! Happy spring!   Websites that have reliable patient information: 1. American Academy of Asthma, Allergy, and Immunology: www.aaaai.org 2. Food Allergy Research and Education (FARE): foodallergy.org 3. Mothers of Asthmatics: http://www.asthmacommunitynetwork.org 4. American College of Allergy, Asthma, and Immunology: www.acaai.org

## 2016-10-24 NOTE — Progress Notes (Signed)
FOLLOW UP  Date of Service/Encounter:  10/24/16   Assessment:   Chronic rhinitis - with uninterpretable testing  Moderate persistent asthma - managed by Dr. Sherene Sires   Asthma Reportables:  Severity: moderate persistent  Risk: low Control: well controlled   Plan/Recommendations:   1. Chronic rhinitis - Testing was difficult to interpret because your histamine was negative.  - We will get blood work to look for evidence of environmental allergens.  - Continue with Dymista two sprays per nostril daily. - Continue with Allegra 180mg  daily. - Once we have the lab results, we can discuss more options to treat your symptoms, including allergy shots.   2. Return in about 3 months (around 01/24/2017).   Subjective:   Jamie Cox is a 56 y.o. female presenting today for follow up of  Chief Complaint  Patient presents with  . Allergy Testing    Jamie Cox has a history of the following: Patient Active Problem List   Diagnosis Date Noted  . Bronchiectasis without acute exacerbation (HCC) 06/29/2016  . 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 Cervical cancer screening 03/04/2016  . Hypercalcemia 07/19/2015  . History of chicken pox   . Intercostal muscle pain 09/20/2013  . Preventative health care 08/06/2013  . GERD (gastroesophageal reflux disease) 08/01/2012  . Tachycardia 07/28/2012  . Health care maintenance 01/31/2012  . Chronic sinusitis 01/31/2012  . 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 Chronic cough 09/06/2010  . Rheumatoid arthritis (HCC) 05/17/2007  . Deficiency anemia 06/23/2006  . Osteoporosis 06/23/2006    History obtained from: chart review and patient.  Jamie Cox was referred by Jamie Panther, MD.     Jamie Cox is a 56 y.o. female presenting for a skin testing. She was last seen approximately one week ago. However at that time, she had taken her Claritin and we could not do skin testing. I recommended starting Dymista two  sprays per nostril daily. I also recommended nasal saline rinses and I started a low dose prednisone burst to help with her symptoms. She does have asthma which is managed by Dr. 41. I did recommend the use of a spacer to improve inhaled medications in the lungs.    Since the last visit, she has done well. She is on the Dymista and she feels that this is working better than the fluticasone. She did take the prednisone pack with good results. She does endorse "wheezing" today as well as postnasal drip and cough. She denies symptoms of food allergies and tolerates all of the major allergens without a problem.    Otherwise, there have been no changes to her past medical history, surgical history, family history, or social history.    Review of Systems: a 14-point review of systems is pertinent for what is mentioned in HPI.  Otherwise, all other systems were negative. Constitutional: negative other than that listed in the HPI Eyes: negative other than that listed in the HPI Ears, nose, mouth, throat, and face: negative other than that listed in the HPI Respiratory: negative other than that listed in the HPI Cardiovascular: negative other than that listed in the HPI Gastrointestinal: negative other than that listed in the HPI Genitourinary: negative other than that listed in the HPI Integument: negative other than that listed in the HPI Hematologic: negative other than that listed in the HPI Musculoskeletal: negative other than that listed in the HPI Neurological: negative other than that listed in the HPI Allergy/Immunologic: negative other than that listed in the  HPI    Objective:   Blood pressure 122/76, pulse 76, resp. rate 16. There is no height or weight on file to calculate BMI.   Physical Exam:  General: Alert, interactive, in no acute distress. Pleasant.  Eyes: No conjunctival injection present on the right, No conjunctival injection present on the left, PERRL bilaterally, No  discharge on the right, No discharge on the left, No Horner-Trantas dots present and allergic shiners present bilaterally Nose/Throat: External nose within normal limits, nasal crease present and septum midline, turbinates edematous with clear discharge, post-pharynx mildly erythematous without cobblestoning in the posterior oropharynx. Tonsils 2+ without exudates Lungs: Clear to auscultation without wheezing, rhonchi or rales. No increased work of breathing. CV: Normal S1/S2, no murmurs. Capillary refill <2 seconds.  Skin: Warm and dry, without lesions or rashes. Neuro:   Grossly intact. No focal deficits appreciated. Responsive to questions.   Diagnostic studies:   Allergy Studies:   Indoor/Outdoor Percutaneous Adult Environmental Panel: difficult to interpret due to a non-reactive histamine.  controls.    Jamie Bonds, MD FAAAAI [facility]

## 2016-10-30 LAB — ALLERGENS, ZONE 3
Alternaria Alternata IgE: <0.1 kU/L
Cat Dander IgE: <0.1 kU/L
Cladosporium Herbarum IgE: <0.1 kU/L
Cockroach, American IgE: <0.1 kU/L
D Farinae IgE: <0.1 kU/L
D Pteronyssinus IgE: <0.1 kU/L
Dog Dander IgE: <0.1 kU/L
Elm, American IgE: <0.1 kU/L
Hickory, White IgE: <0.1 kU/L
Kentucky Bluegrass IgE: <0.1 kU/L
Oak, White IgE: <0.1 kU/L
Penicillium Chrysogen IgE: <0.1 kU/L
Plantain, English IgE: <0.1 kU/L
White Mulberry IgE: <0.1 kU/L

## 2016-11-21 ENCOUNTER — Encounter: Payer: Self-pay | Admitting: Family Medicine

## 2016-11-21 ENCOUNTER — Ambulatory Visit (INDEPENDENT_AMBULATORY_CARE_PROVIDER_SITE_OTHER): Payer: BC Managed Care – PPO | Admitting: Family Medicine

## 2016-11-21 ENCOUNTER — Ambulatory Visit (HOSPITAL_BASED_OUTPATIENT_CLINIC_OR_DEPARTMENT_OTHER)
Admission: RE | Admit: 2016-11-21 | Discharge: 2016-11-21 | Disposition: A | Discharge status: Home / Self Care | Payer: BC Managed Care – PPO | Source: Ambulatory Visit | Attending: Family Medicine | Admitting: Family Medicine

## 2016-11-21 VITALS — BP 124/74 | HR 76 | Temp 97.7°F | Resp 18 | Wt 104.6 lb

## 2016-11-21 DIAGNOSIS — M069 Rheumatoid arthritis, unspecified: Secondary | ICD-10-CM

## 2016-11-21 DIAGNOSIS — R05 Cough: Secondary | ICD-10-CM

## 2016-11-21 DIAGNOSIS — K219 Gastro-esophageal reflux disease without esophagitis: Secondary | ICD-10-CM | POA: Diagnosis not present

## 2016-11-21 DIAGNOSIS — D539 Nutritional anemia, unspecified: Secondary | ICD-10-CM | POA: Diagnosis not present

## 2016-11-21 DIAGNOSIS — J328 Other chronic sinusitis: Secondary | ICD-10-CM

## 2016-11-21 DIAGNOSIS — J449 Chronic obstructive pulmonary disease, unspecified: Secondary | ICD-10-CM | POA: Insufficient documentation

## 2016-11-21 DIAGNOSIS — R059 Cough, unspecified: Secondary | ICD-10-CM

## 2016-11-21 DIAGNOSIS — R053 Chronic cough: Secondary | ICD-10-CM

## 2016-11-21 DIAGNOSIS — J479 Bronchiectasis, uncomplicated: Secondary | ICD-10-CM

## 2016-11-21 LAB — CBC WITH DIFFERENTIAL/PLATELET
Basophils Absolute: 0.2 10*3/uL — ABNORMAL HIGH (ref 0.0–0.1)
Basophils Relative: 3.1 % — ABNORMAL HIGH (ref 0.0–3.0)
Eosinophils Absolute: 0 10*3/uL (ref 0.0–0.7)
Eosinophils Relative: 0.6 % (ref 0.0–5.0)
HEMATOCRIT: 32 % — AB (ref 36.0–46.0)
Hemoglobin: 9.9 g/dL — ABNORMAL LOW (ref 12.0–15.0)
LYMPHS ABS: 0.9 10*3/uL (ref 0.7–4.0)
Lymphocytes Relative: 14.2 % (ref 12.0–46.0)
MCHC: 31 g/dL (ref 30.0–36.0)
MCV: 72.8 fl — AB (ref 78.0–100.0)
MONO ABS: 0.5 10*3/uL (ref 0.1–1.0)
Monocytes Relative: 7.7 % (ref 3.0–12.0)
NEUTROS ABS: 5 10*3/uL (ref 1.4–7.7)
Neutrophils Relative %: 74.4 % (ref 43.0–77.0)
Platelets: 254 10*3/uL (ref 150.0–400.0)
RBC: 4.4 Mil/uL (ref 3.87–5.11)
RDW: 17.5 % — AB (ref 11.5–15.5)
WBC: 6.7 10*3/uL (ref 4.0–10.5)

## 2016-11-21 LAB — COMPREHENSIVE METABOLIC PANEL
ALBUMIN: 3.9 g/dL (ref 3.5–5.2)
ALK PHOS: 60 U/L (ref 39–117)
ALT: 11 U/L (ref 0–35)
AST: 18 U/L (ref 0–37)
BUN: 10 mg/dL (ref 6–23)
CALCIUM: 9.7 mg/dL (ref 8.4–10.5)
CHLORIDE: 104 meq/L (ref 96–112)
CO2: 28 mEq/L (ref 19–32)
Creatinine, Ser: 0.68 mg/dL (ref 0.40–1.20)
GFR: 114.21 mL/min (ref >60.00)
Glucose, Bld: 77 mg/dL (ref 70–99)
POTASSIUM: 4.1 meq/L (ref 3.5–5.1)
Sodium: 138 mEq/L (ref 135–145)
TOTAL PROTEIN: 7.5 g/dL (ref 6.0–8.3)
Total Bilirubin: 0.2 mg/dL (ref 0.2–1.2)

## 2016-11-21 MED ORDER — HYDROCODONE-HOMATROPINE 5-1.5 MG/5ML PO SYRP: 5.0000 mL | ORAL_SOLUTION | Freq: Three times a day (TID) | ORAL | 0 refills | Status: DC | PRN

## 2016-11-21 MED ORDER — BENZONATATE 100 MG PO CAPS: 200.0000 mg | ORAL_CAPSULE | Freq: Three times a day (TID) | ORAL | 1 refills | Status: DC | PRN

## 2016-11-21 NOTE — Assessment & Plan Note (Signed)
Has been diagnosed with asthma by otolaryngoloist but has not returned to pulmonology to confirm diagnosis. She is encouraged to make an appt and to continue Symbicort, Singulair and Albuterol prn

## 2016-11-21 NOTE — Assessment & Plan Note (Signed)
Is following with Rheumatology now and has had a good response to MTX with decreased pain

## 2016-11-21 NOTE — Assessment & Plan Note (Signed)
Worse over past 3 weeks. No obvious fevers or sign of acute infection. Worse with exertion and lying down. Bad enough to cause gagging, wheezing, SOB and chest pain, check a CXR and given rx for Tessalon perles for day time use and Hydromet for qhs. Check CBC with diff and treat  accordingly

## 2016-11-21 NOTE — Assessment & Plan Note (Signed)
With worsening cough, keeps her up at night, will refer to Gastroenterology for further consideration

## 2016-11-21 NOTE — Progress Notes (Signed)
Subjective:  I acted as a Neurosurgeon for Dr. Abner Greenspan. [place], [place]  Patient ID: Jamie Cox, female    DOB: [birth year], 58 y.o.   MRN: [id number]  No chief complaint on file.   HPI  Patient is in today for a follow up. Patient states she continues to cough and it is hard for her to stop. She states the cough wakes her up at night. No recent febrile illness or acute hospitalizations. Denies palp/HA/fevers/GI or GU c/o. Taking meds as prescribed. Worse over past 3 weeks. No obvious fevers or sign of acute infection. Worse with exertion and lying down. Bad enough to cause gagging, wheezing, SOB and sharp, fleeting chest pain with coughing spells.    Patient Care Team: [name], MD as PCP - General (Family Medicine) Mardella Layman, MD as Consulting Physician (Gastroenterology)   Past Medical History:  Diagnosis Date  . Asthma   . Chronic sinusitis    Followed by Dr. Jenne Pane  . Environmental allergies   . GERD (gastroesophageal reflux disease)   . 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 History of chicken pox   . Hypercalcemia 07/19/2015  . Osteoporosis, unspecified    steroid induced  . Personal history of other diseases of circulatory system   . Rheumatoid arthritis(714.0)   . Unspecified deficiency anemia    microcytic  . Urticaria     Past Surgical History:  Procedure Laterality Date  . NASAL SINUS SURGERY  06/2012    Family History  Problem Relation Age of Onset  . Liver cancer Maternal Grandmother   . Hypertension Maternal Grandmother   . Heart attack Maternal Grandmother   . Scleroderma Mother   . Heart disease Mother   . Kidney disease Mother   . Prostate cancer Father   . Diabetes Father   . Heart disease Father        CHF  . Arthritis Father        s/p hip replacement  . Cancer Father        lung cancer with mets, smoker  . Lung cancer Paternal Uncle   . Coronary artery disease Paternal Grandmother   . Hypertension Paternal Grandmother     . Liver cancer Paternal Grandmother   . Hyperlipidemia Son   . Alzheimer's disease Paternal Grandfather   . Other Sister        Cardiomegaly  . Arthritis Sister   . Heart disease Sister        rare  . Sleep apnea Brother   . Arthritis Daughter   . Lupus Cousin   . Arthritis Cousin   . Colon cancer Neg Hx     Social History   Social History  . Marital status: Married    Spouse name: N/A  . Number of children: 2  . Years of education: N/A   Occupational History  . Data processing manager    Social History Main Topics  . Smoking status: Never Smoker  . Smokeless tobacco: Never Used  . Alcohol use No  . Drug use: No  . Sexual activity: Not Currently   Other Topics Concern  . Not on file   Social History Narrative  . No narrative on file    Outpatient Medications Prior to Visit  Medication Sig Dispense Refill  . acetaminophen (TYLENOL) 650 MG CR tablet Take 650 mg by mouth every 8 (eight) hours as needed. For pain    . albuterol (PROVENTIL HFA;VENTOLIN HFA) 108 (90 Base) MCG/ACT inhaler Inhale  1-2 puffs into the lungs every 6 (six) hours as needed for wheezing or shortness of breath (cough). For shortness of breath/wheezing 3.7 g 1  . budesonide-formoterol (SYMBICORT) 80-4.5 MCG/ACT inhaler Inhale 2 puffs into the lungs 2 (two) times daily. 1 Inhaler 3  . calcium-vitamin D (CALCIUM 500/D) 500-200 MG-UNIT per tablet 1 tablet.    . fluticasone (FLONASE) 50 MCG/ACT nasal spray Place 2 sprays into both nostrils daily. 16 g 3  . folic acid (FOLVITE) 1 MG tablet     . HEMOCYTE-F 324-1 MG TABS TAKE ONE TABLET BY MOUTH ONCE DAILY 30 each 5  . HYDROcodone-acetaminophen (NORCO) 5-325 MG tablet Take 1 tablet by mouth every 6 (six) hours as needed for moderate pain. August 2018 40 tablet 0  . HYDROcodone-acetaminophen (NORCO/VICODIN) 5-325 MG tablet Take 1 tablet by mouth every 6 (six) hours as needed for moderate pain. June 2018 40 tablet 0  . HYDROcodone-acetaminophen (NORCO/VICODIN)  5-325 MG tablet Take 1 tablet by mouth every 6 (six) hours as needed for moderate pain. July 2018 40 tablet 0  . hyoscyamine (LEVSIN, ANASPAZ) 0.125 MG tablet Take 1 tablet (0.125 mg total) by mouth every 4 (four) hours as needed. 30 tablet 0  . loratadine (CLARITIN) 10 MG tablet 10 mg.    . meloxicam (MOBIC) 7.5 MG tablet Take 1-2 tablets by mouth daily as needed for pain. 60 tablet 3  . methotrexate 2.5 MG tablet     . metoprolol succinate (TOPROL-XL) 25 MG 24 hr tablet Take 1 tablet (25 mg total) by mouth daily. 30 tablet 5  . montelukast (SINGULAIR) 10 MG tablet TAKE ONE TABLET BY MOUTH AT BEDTIME 30 tablet 5  . Multiple Vitamin (MULTIVITAMIN) capsule Take 1 capsule by mouth daily.      . pantoprazole (PROTONIX) 40 MG tablet Take 1 tablet (40 mg total) by mouth daily. 90 tablet 1  . predniSONE (DELTASONE) 2.5 MG tablet TAKE ONE TABLET BY MOUTH ONCE DAILY WITH BREAKFAST 90 tablet 1  . ranitidine (ZANTAC) 300 MG tablet TAKE ONE-HALF TABLET BY MOUTH AT BEDTIME 90 tablet 1  . triamcinolone ointment (KENALOG) 0.1 % Apply 1 application topically 2 (two) times daily. 30 g 5   No facility-administered medications prior to visit.     Allergies  Allergen Reactions  . Avelox [Moxifloxacin Hcl In Nacl] Diarrhea, Nausea And Vomiting and Other (See Comments)    Dizziness, Cold chills   . Bactrim [Sulfamethoxazole-Trimethoprim] Hives  . Other Diarrhea, Nausea And Vomiting and Other (See Comments)    Dizziness, Cold chills   . Sulfamethoxazole-Trimethoprim Hives    Review of Systems  Constitutional: Positive for malaise/fatigue. Negative for fever.  HENT: Negative for congestion.   Eyes: Negative for blurred vision.  Respiratory: Positive for cough, sputum production, shortness of breath and wheezing.   Cardiovascular: Positive for chest pain. Negative for palpitations and leg swelling.  Gastrointestinal: Negative for vomiting.  Musculoskeletal: Negative for back pain.  Skin: Negative for rash.   Neurological: Negative for loss of consciousness and headaches.       Objective:    Physical Exam  Constitutional: She is oriented to person, place, and time. She appears well-developed and well-nourished. No distress.  HENT:  Head: Normocephalic and atraumatic.  Nose: Nose normal.  Eyes: Conjunctivae are normal. Right eye exhibits no discharge. Left eye exhibits no discharge.  Neck: Normal range of motion. Neck supple. No thyromegaly present.  Cardiovascular: Normal rate and regular rhythm.   No murmur heard. Pulmonary/Chest: Effort normal and  breath sounds normal. She has no wheezes.  Abdominal: Soft. Bowel sounds are normal. There is no tenderness.  Musculoskeletal: Normal range of motion. She exhibits no edema or deformity.  Neurological: She is alert and oriented to person, place, and time.  Skin: Skin is warm and dry. She is not diaphoretic.  Psychiatric: She has a normal mood and affect.  Nursing note and vitals reviewed.   BP 124/74 (BP Location: Left Arm, Patient Position: Sitting, Cuff Size: Normal)   Pulse 76   Temp 97.7 F (36.5 C) (Oral)   Resp 18   Wt 104 lb 9.6 oz (47.4 kg)   SpO2 (!) 78%   BMI 20.43 kg/m  Wt Readings from Last 3 Encounters:  11/21/16 104 lb 9.6 oz (47.4 kg)  10/21/16 103 lb 9.6 oz (47 kg)  10/19/16 102 lb 9.6 oz (46.5 kg)   BP Readings from Last 3 Encounters:  11/21/16 124/74  10/24/16 122/76  10/21/16 (!) 102/92     Immunization History  Administered Date(s) Administered  . Influenza Split 04/05/2011  . Influenza Whole 04/10/2008, 05/15/2009, 05/21/2010, 02/05/2012  . Influenza,inj,Quad PF,36+ Mos 08/06/2013, 05/09/2016  . Pneumococcal Polysaccharide-23 07/01/2010  . Td 05/15/2009    Health Maintenance  Topic Date Due  . INFLUENZA VACCINE  01/04/2017  . MAMMOGRAM  08/06/2017  . PAP SMEAR  03/05/2019  . TETANUS/TDAP  05/16/2019  . COLONOSCOPY  10/16/2022  . Hepatitis C Screening  Completed  . HIV Screening  Completed      Lab Results  Component Value Date   WBC 9.6 06/09/2016   HGB 11.0 (L) 06/09/2016   HCT 35.1 (L) 06/09/2016   PLT 282.0 06/09/2016   GLUCOSE 86 06/09/2016   CHOL 190 07/13/2015   TRIG 111.0 07/13/2015   HDL 73.90 07/13/2015   LDLCALC 94 07/13/2015   ALT 15 06/09/2016   AST 19 06/09/2016   NA 138 06/09/2016   K 3.7 06/09/2016   CL 102 06/09/2016   CREATININE 0.80 06/09/2016   BUN 17 06/09/2016   CO2 31 06/09/2016   TSH 1.47 06/09/2016    Lab Results  Component Value Date   TSH 1.47 06/09/2016   Lab Results  Component Value Date   WBC 9.6 06/09/2016   HGB 11.0 (L) 06/09/2016   HCT 35.1 (L) 06/09/2016   MCV 72.4 (L) 06/09/2016   PLT 282.0 06/09/2016   Lab Results  Component Value Date   NA 138 06/09/2016   K 3.7 06/09/2016   CO2 31 06/09/2016   GLUCOSE 86 06/09/2016   BUN 17 06/09/2016   CREATININE 0.80 06/09/2016   BILITOT 0.2 06/09/2016   ALKPHOS 53 06/09/2016   AST 19 06/09/2016   ALT 15 06/09/2016   PROT 8.5 (H) 06/09/2016   ALBUMIN 4.2 06/09/2016   CALCIUM 10.1 06/09/2016   CALCIUM 9.8 06/09/2016   GFR 94.83 06/09/2016   Lab Results  Component Value Date   CHOL 190 07/13/2015   Lab Results  Component Value Date   HDL 73.90 07/13/2015   Lab Results  Component Value Date   LDLCALC 94 07/13/2015   Lab Results  Component Value Date   TRIG 111.0 07/13/2015   Lab Results  Component Value Date   CHOLHDL 3 07/13/2015   No results found for: HGBA1C       Assessment & Plan:   Problem List Items Addressed This Visit    Deficiency anemia    Improved with last blood draw but still present. Increase leafy greens, consider  increased lean red meat and using cast iron cookware. Continue to monitor, report any concerns      Rheumatoid arthritis (HCC)    Is following with Rheumatology now and has had a good response to MTX with decreased pain      Chronic cough    Worse over past 3 weeks. No obvious fevers or sign of acute infection. Worse with  exertion and lying down. Bad enough to cause gagging, wheezing, SOB and chest pain, check a CXR and given rx for Tessalon perles for day time use and Hydromet for qhs. Check CBC with diff and treat  accordingly      Chronic sinusitis    Was seen by ENT and allergy testing was negative but a scope was not performed      Relevant Medications   benzonatate (TESSALON PERLES) 100 MG capsule   HYDROcodone-homatropine (HYCODAN) 5-1.5 MG/5ML syrup   GERD (gastroesophageal reflux disease)    With worsening cough, keeps her up at night, will refer to Gastroenterology for further consideration      Relevant Orders   Ambulatory referral to Gastroenterology   CBC w/Diff   Comprehensive metabolic panel   Bronchiectasis without acute exacerbation (HCC)    Has been diagnosed with asthma by otolaryngoloist but has not returned to pulmonology to confirm diagnosis. She is encouraged to make an appt and to continue Symbicort, Singulair and Albuterol prn       Other Visit Diagnoses    Cough    -  Primary   Relevant Orders   DG Chest 2 View   Ambulatory referral to Gastroenterology   CBC w/Diff   Comprehensive metabolic panel      I am having Ms. Abundis start on benzonatate and HYDROcodone-homatropine. I am also having her maintain her acetaminophen, multivitamin, loratadine, calcium-vitamin D, predniSONE, fluticasone, pantoprazole, hyoscyamine, albuterol, budesonide-formoterol, HEMOCYTE-F, meloxicam, ranitidine, montelukast, folic acid, methotrexate, triamcinolone ointment, metoprolol succinate, HYDROcodone-acetaminophen, HYDROcodone-acetaminophen, and HYDROcodone-acetaminophen.  Meds ordered this encounter  Medications  . benzonatate (TESSALON PERLES) 100 MG capsule    Sig: Take 2 capsules (200 mg total) by mouth 3 (three) times daily as needed for cough.    Dispense:  60 capsule    Refill:  1  . HYDROcodone-homatropine (HYCODAN) 5-1.5 MG/5ML syrup    Sig: Take 5 mLs by mouth every 8 (eight)  hours as needed for cough.    Dispense:  120 mL    Refill:  0    CMA served as scribe during this visit. History, Physical and Plan performed by medical provider. Documentation and orders reviewed and attested to.  Danise Edge, MD

## 2016-11-21 NOTE — Patient Instructions (Signed)
Cough, Adult Coughing is a reflex that clears your throat and your airways. Coughing helps to heal and protect your lungs. It is normal to cough occasionally, but a cough that happens with other symptoms or lasts a long time may be a sign of a condition that needs treatment. A cough may last only 2-3 weeks (acute), or it may last longer than 8 weeks (chronic). What are the causes? Coughing is commonly caused by:  Breathing in substances that irritate your lungs.  A viral or bacterial respiratory infection.  Allergies.  Asthma.  Postnasal drip.  Smoking.  Acid backing up from the stomach into the esophagus (gastroesophageal reflux).  Certain medicines.  Chronic lung problems, including COPD (or rarely, lung cancer).  Other medical conditions such as heart failure.  Follow these instructions at home: Pay attention to any changes in your symptoms. Take these actions to help with your discomfort:  Take medicines only as told by your health care provider. ? If you were prescribed an antibiotic medicine, take it as told by your health care provider. Do not stop taking the antibiotic even if you start to feel better. ? Talk with your health care provider before you take a cough suppressant medicine.  Drink enough fluid to keep your urine clear or pale yellow.  If the air is dry, use a cold steam vaporizer or humidifier in your bedroom or your home to help loosen secretions.  Avoid anything that causes you to cough at work or at home.  If your cough is worse at night, try sleeping in a semi-upright position.  Avoid cigarette smoke. If you smoke, quit smoking. If you need help quitting, ask your health care provider.  Avoid caffeine.  Avoid alcohol.  Rest as needed.  Contact a health care provider if:  You have new symptoms.  You cough up pus.  Your cough does not get better after 2-3 weeks, or your cough gets worse.  You cannot control your cough with suppressant  medicines and you are losing sleep.  You develop pain that is getting worse or pain that is not controlled with pain medicines.  You have a fever.  You have unexplained weight loss.  You have night sweats. Get help right away if:  You cough up blood.  You have difficulty breathing.  Your heartbeat is very fast. This information is not intended to replace advice given to you by your health care provider. Make sure you discuss any questions you have with your health care provider. Document Released: 11/19/2010 Document Revised: 10/29/2015 Document Reviewed: 07/30/2014 Elsevier Interactive Patient Education  2017 [employer]  

## 2016-11-21 NOTE — Assessment & Plan Note (Signed)
Was seen by ENT and allergy testing was negative but a scope was not performed

## 2016-11-21 NOTE — Assessment & Plan Note (Signed)
Improved with last blood draw but still present. Increase leafy greens, consider increased lean red meat and using cast iron cookware. Continue to monitor, report any concerns

## 2016-11-22 ENCOUNTER — Other Ambulatory Visit: Payer: Self-pay | Admitting: Family Medicine

## 2016-11-22 DIAGNOSIS — K625 Hemorrhage of anus and rectum: Secondary | ICD-10-CM

## 2016-11-22 MED ORDER — AMOXICILLIN-POT CLAVULANATE 875-125 MG PO TABS: 1.0000 | ORAL_TABLET | Freq: Two times a day (BID) | ORAL | 0 refills | Status: DC

## 2016-11-22 MED ORDER — PREDNISONE 20 MG PO TABS: ORAL_TABLET | ORAL | 0 refills | Status: DC

## 2016-11-26 ENCOUNTER — Other Ambulatory Visit: Payer: Self-pay | Admitting: Family Medicine

## 2016-12-02 ENCOUNTER — Ambulatory Visit: Payer: BC Managed Care – PPO

## 2016-12-02 VITALS — BP 143/87 | HR 91

## 2016-12-02 DIAGNOSIS — R7981 Abnormal blood-gas level: Secondary | ICD-10-CM

## 2016-12-02 NOTE — Progress Notes (Signed)
Pre visit review using our clinic tool,if applicable. No additional management support is needed unless otherwise documented below in the visit note.   Patient in to recheck O2 level per order from Dr. Rogelia Rohrer dated 11/21/16. Last O2 = 78%.   Patient states her cough is better and she does have mild chest pains occaisionally. Currently not having chest pains or sob.  Patients O2 level today = 100%. BP = 143/87 P = 79  Patient states she did not take BP medication prior to coming to [place] today    Per Dr. Abner Greenspan patient to take BP medication as ordered. Return for office visit if her symptoms reoccur. Patient agreed.

## 2017-01-03 ENCOUNTER — Encounter (INDEPENDENT_AMBULATORY_CARE_PROVIDER_SITE_OTHER): Payer: Self-pay

## 2017-01-03 ENCOUNTER — Ambulatory Visit (INDEPENDENT_AMBULATORY_CARE_PROVIDER_SITE_OTHER): Payer: BC Managed Care – PPO | Admitting: Gastroenterology

## 2017-01-03 ENCOUNTER — Encounter: Payer: Self-pay | Admitting: Gastroenterology

## 2017-01-03 VITALS — BP 100/68 | HR 80 | Ht 61.0 in | Wt 101.8 lb

## 2017-01-03 DIAGNOSIS — J479 Bronchiectasis, uncomplicated: Secondary | ICD-10-CM | POA: Diagnosis not present

## 2017-01-03 DIAGNOSIS — K219 Gastro-esophageal reflux disease without esophagitis: Secondary | ICD-10-CM | POA: Diagnosis not present

## 2017-01-03 DIAGNOSIS — J328 Other chronic sinusitis: Secondary | ICD-10-CM

## 2017-01-03 DIAGNOSIS — R05 Cough: Secondary | ICD-10-CM

## 2017-01-03 DIAGNOSIS — R053 Chronic cough: Secondary | ICD-10-CM

## 2017-01-03 NOTE — Progress Notes (Addendum)
Post Gastroenterology Consult Note:  History: Jamie Cox 01/03/2017  Referring physician: [name], MD  Reason for consult/chief complaint: Cough (chronic x 2-3 years)   Subjective  HPI:  This woman was referred by Dr. Abner Greenspan a primary care for a chronic cough and recent concerns of GERD symptoms. Ashvika reports at least a decade of a chronic cough that has undergone multiple evaluations and appears due to Minasian of chronic sinusitis and bronchiectasis. I reviewed the recent primary care, ENT and pulmonary clinic notes. CT reports from the sinus and chest earlier this year are also reviewed. Shawnessy came to Dr. Rogelia Rohrer last month complaining of several weeks of worsening cough both daytime and night. She did not seem to have an increase in her reflux symptoms of occasional pyrosis. Nevertheless, there was concern for GERD possibly contribute into the worsened cough lately. Manali tries not the shortly before going to bed, but this is difficult due to the work hours that she keeps. 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 She denies dysphagia, odynophagia, early satiety, nausea or vomiting. Jontue was here in clinic for a screening colonoscopy with Dr. Jarold Motto in 2014.  ROS:  Review of Systems  Constitutional: Negative for appetite change and unexpected weight change.  HENT: Negative for mouth sores and voice change.   Eyes: Negative for pain and redness.  Respiratory: Negative for cough and shortness of breath.   Cardiovascular: Negative for chest pain and palpitations.  Genitourinary: Negative for dysuria and hematuria.  Musculoskeletal: Negative for arthralgias and myalgias.  Skin: Negative for pallor and rash.  Neurological: Negative for weakness and headaches.  Hematological: Negative for adenopathy.     Past Medical History: Past Medical History:  Diagnosis Date  . Asthma   . Chronic sinusitis    Followed  by Dr. Jenne Pane  . Environmental allergies   . GERD (gastroesophageal reflux disease)   . 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 History of chicken pox   . Hypercalcemia 07/19/2015  . Osteoporosis, unspecified    steroid induced  . Personal history of other diseases of circulatory system   . Rheumatoid arthritis(714.0)   . Unspecified deficiency anemia    microcytic  . Urticaria      Past Surgical History: Past Surgical History:  Procedure Laterality Date  . NASAL SINUS SURGERY  06/2012     Family History: Family History  Problem Relation Age of Onset  . Liver cancer Maternal Grandmother   . Hypertension Maternal Grandmother   . Heart attack Maternal Grandmother   . Scleroderma Mother   . Heart disease Mother   . Kidney disease Mother   . Prostate cancer Father   . Diabetes Father   . Heart disease Father        CHF  . Arthritis Father        s/p hip replacement  . Cancer Father        lung cancer with mets, smoker  . Lung cancer Paternal Uncle   . Coronary artery disease Paternal Grandmother   . Hypertension Paternal Grandmother   . Liver cancer Paternal Grandmother   . Hyperlipidemia Son   . Alzheimer's disease Paternal Grandfather   . Other Sister        Cardiomegaly  . Arthritis Sister   . Heart disease Sister        rare  . Sleep apnea Brother   . Arthritis Daughter   . Lupus Cousin   .  Arthritis Cousin   . Colon cancer Neg Hx     Social History: Social History   Social History  . Marital status: Married    Spouse name: N/A  . Number of children: 2  . Years of education: N/A   Occupational History  . Data processing manager    Social History Main Topics  . Smoking status: Never Smoker  . Smokeless tobacco: Never Used  . Alcohol use No  . Drug use: No  . Sexual activity: Not Currently   Other Topics Concern  . None   Social History Narrative  . None    Allergies: Allergies  Allergen Reactions  . Avelox [Moxifloxacin Hcl In  Nacl] Diarrhea, Nausea And Vomiting and Other (See Comments)    Dizziness, Cold chills   . Bactrim [Sulfamethoxazole-Trimethoprim] Hives  . Other Diarrhea, Nausea And Vomiting and Other (See Comments)    Dizziness, Cold chills   . Sulfamethoxazole-Trimethoprim Hives    Outpatient Meds: Current Outpatient Prescriptions  Medication Sig Dispense Refill  . acetaminophen (TYLENOL) 650 MG CR tablet Take 650 mg by mouth every 8 (eight) hours as needed. For pain    . albuterol (PROVENTIL HFA;VENTOLIN HFA) 108 (90 Base) MCG/ACT inhaler Inhale 1-2 puffs into the lungs every 6 (six) hours as needed for wheezing or shortness of breath (cough). For shortness of breath/wheezing 3.7 g 1  . benzonatate (TESSALON PERLES) 100 MG capsule Take 2 capsules (200 mg total) by mouth 3 (three) times daily as needed for cough. 60 capsule 1  . budesonide-formoterol (SYMBICORT) 80-4.5 MCG/ACT inhaler Inhale 2 puffs into the lungs 2 (two) times daily. 1 Inhaler 3  . calcium-vitamin D (CALCIUM 500/D) 500-200 MG-UNIT per tablet 1 tablet.    . fluticasone (FLONASE) 50 MCG/ACT nasal spray Place 2 sprays into both nostrils daily. 16 g 3  . folic acid (FOLVITE) 1 MG tablet     . HEMOCYTE-F 324-1 MG TABS TAKE ONE TABLET BY MOUTH ONCE DAILY 30 each 5  . HYDROcodone-acetaminophen (NORCO) 5-325 MG tablet Take 1 tablet by mouth every 6 (six) hours as needed for moderate pain. August 2018 40 tablet 0  . HYDROcodone-acetaminophen (NORCO/VICODIN) 5-325 MG tablet Take 1 tablet by mouth every 6 (six) hours as needed for moderate pain. June 2018 40 tablet 0  . HYDROcodone-acetaminophen (NORCO/VICODIN) 5-325 MG tablet Take 1 tablet by mouth every 6 (six) hours as needed for moderate pain. July 2018 40 tablet 0  . HYDROcodone-homatropine (HYCODAN) 5-1.5 MG/5ML syrup Take 5 mLs by mouth every 8 (eight) hours as needed for cough. 120 mL 0  . hyoscyamine (LEVSIN, ANASPAZ) 0.125 MG tablet Take 1 tablet (0.125 mg total) by mouth every 4 (four)  hours as needed. 30 tablet 0  . loratadine (CLARITIN) 10 MG tablet 10 mg.    . meloxicam (MOBIC) 7.5 MG tablet Take 1-2 tablets by mouth daily as needed for pain. 60 tablet 3  . methotrexate 2.5 MG tablet     . metoprolol succinate (TOPROL-XL) 25 MG 24 hr tablet TAKE 1 TABLET BY MOUTH EVERY DAY 30 tablet 0  . montelukast (SINGULAIR) 10 MG tablet TAKE ONE TABLET BY MOUTH AT BEDTIME 30 tablet 5  . Multiple Vitamin (MULTIVITAMIN) capsule Take 1 capsule by mouth daily.      . pantoprazole (PROTONIX) 40 MG tablet Take 1 tablet (40 mg total) by mouth daily. 90 tablet 1  . predniSONE (DELTASONE) 2.5 MG tablet TAKE ONE TABLET BY MOUTH ONCE DAILY WITH BREAKFAST 90 tablet 1  .  ranitidine (ZANTAC) 300 MG tablet TAKE ONE-HALF TABLET BY MOUTH AT BEDTIME 90 tablet 1  . triamcinolone ointment (KENALOG) 0.1 % Apply 1 application topically 2 (two) times daily. 30 g 5   No current facility-administered medications for this visit.       ___________________________________________________________________ Objective   Exam:  BP 100/68   Pulse 80   Ht 5\' 1"  (1.549 m)   Wt 101 lb 12.8 oz (46.2 kg)   BMI 19.23 kg/m    General: this is a(n) Thin middle-aged woman with a slightly raspy voice.   Eyes: sclera anicteric, no redness  ENT: oral mucosa moist without lesions, no cervical or supraclavicular lymphadenopathy, good dentition  CV: RRR without murmur, S1/S2, no JVD, no peripheral edema  Resp: clear to auscultation bilaterally, normal RR and effort noted  GI: soft, no tenderness, with active bowel sounds. No guarding or palpable organomegaly noted.  Skin; warm and dry, no rash or jaundice noted  Neuro: awake, alert and oriented x 3. Normal gross motor function and fluent speech  Radiologic Studies:  1/18 CT chest - bronchiestasis CT sinus - sinusitis Had ENT eval , after she had undergone a treatment of antibiotics. Fiberoptic exam revealed patent sinus drainage from previous surgery and no  apparent ongoing infection.- suggested allergist evaluation  Assessment: Encounter Diagnoses  Name Primary?  . Chronic cough Yes  . Bronchiectasis without complication (HCC)   . Other chronic sinusitis   . Gastroesophageal reflux disease, esophagitis presence not specified     Cindy's chronic cough appears to be a combination of allergic nasal drip/sinusitis and bronchiectasis, and she reports that she has not always been completely adherent to her treatments for this due to her busy work schedule and caring for her grandchildren. . She has some chronic mild GERD symptoms that seem stable lately. Nevertheless, there is been concern about GERD and possibly precipitating the worsening cough of late. Where this has been a chronic and frustrating situation, I am agreeable to doing an upper endoscopy to see if we can shed some light on this.  Plan:  EGD, she is agreeable  The benefits and risks of the planned procedure were described in detail with the patient or (when appropriate) their health care proxy.  Risks were outlined as including, but not limited to, bleeding, infection, perforation, adverse medication reaction leading to cardiac or pulmonary decompensation, or pancreatitis (if ERCP).  The limitation of incomplete mucosal visualization was also discussed.  No guarantees or warranties were given.  I would not make any change in her PPI therapy at this point.   Thank you for the courtesy of this consult.  Please call me with any questions or concerns.  2/18 III  CC: Charlie Pitter, MD

## 2017-01-03 NOTE — Patient Instructions (Signed)
If you are age 58 or older, your body mass index should be between 23-30. Your Body mass index is 19.23 kg/m. If this is out of the aforementioned range listed, please consider follow up with your Primary Care Provider.  If you are age 32 or younger, your body mass index should be between 19-25. Your Body mass index is 19.23 kg/m. If this is out of the aformentioned range listed, please consider follow up with your Primary Care Provider.   You have been scheduled for an endoscopy. Please follow written instructions given to you at your visit today. If you use inhalers (even only as needed), please bring them with you on the day of your procedure. Your physician has requested that you go to www.startemmi.com and enter the access code given to you at your visit today. This web site gives a general overview about your procedure. However, you should still follow specific instructions given to you by our office regarding your preparation for the procedure.  Thank you for choosing [facility]  Dr Amada Jupiter III

## 2017-01-04 ENCOUNTER — Encounter: Payer: Self-pay | Admitting: Gastroenterology

## 2017-01-10 ENCOUNTER — Encounter: Payer: Self-pay | Admitting: Gastroenterology

## 2017-01-10 ENCOUNTER — Ambulatory Visit (AMBULATORY_SURGERY_CENTER): Payer: BC Managed Care – PPO | Admitting: Gastroenterology

## 2017-01-10 VITALS — BP 112/64 | HR 94 | Temp 97.5°F | Resp 17 | Ht 61.0 in | Wt 101.0 lb

## 2017-01-10 DIAGNOSIS — R05 Cough: Secondary | ICD-10-CM

## 2017-01-10 DIAGNOSIS — K219 Gastro-esophageal reflux disease without esophagitis: Secondary | ICD-10-CM

## 2017-01-10 DIAGNOSIS — R053 Chronic cough: Secondary | ICD-10-CM

## 2017-01-10 MED ORDER — SODIUM CHLORIDE 0.9 % IV SOLN: 500.0000 mL | INTRAVENOUS | Status: DC

## 2017-01-10 NOTE — Patient Instructions (Signed)
YOU HAD AN ENDOSCOPIC PROCEDURE TODAY AT THE [facility]:   Refer to the procedure report that was given to you for any specific questions about what was found during the examination.  If the procedure report does not answer your questions, please call your gastroenterologist to clarify.  If you requested that your care partner not be given the details of your procedure findings, then the procedure report has been included in a sealed envelope for you to review at your convenience later.  YOU SHOULD EXPECT: Some feelings of bloating in the abdomen. Passage of more gas than usual.  Walking can help get rid of the air that was put into your GI tract during the procedure and reduce the bloating. If you had a lower endoscopy (such as a colonoscopy or flexible sigmoidoscopy) you may notice spotting of blood in your stool or on the toilet paper. If you underwent a bowel prep for your procedure, you may not have a normal bowel movement for a few days.  Please Note:  You might notice some irritation and congestion in your nose or some drainage.  This is from the oxygen used during your procedure.  There is no need for concern and it should clear up in a day or so.  SYMPTOMS TO REPORT IMMEDIATELY:    Following upper endoscopy (EGD)  Vomiting of blood or coffee ground material  New chest pain or pain under the shoulder blades  Painful or persistently difficult swallowing  New shortness of breath  Fever of 100F or higher  Black, tarry-looking stools  For urgent or emergent issues, a gastroenterologist can be reached at any hour by calling [phone].    DIET:  We do recommend a small meal at first, but then you may proceed to your regular diet.  Drink plenty of fluids but you should avoid alcoholic beverages for 24 hours.  ACTIVITY:  You should plan to take it easy for the rest of today and you should NOT DRIVE or use heavy machinery until tomorrow (because of the sedation medicines  used during the test).    FOLLOW UP: Our staff will call the number listed on your records the next business day following your procedure to check on you and address any questions or concerns that you may have regarding the information given to you following your procedure. If we do not reach you, we will leave a message.  However, if you are feeling well and you are not experiencing any problems, there is no need to return our call.  We will assume that you have returned to your regular daily activities without incident.  If any biopsies were taken you will be contacted by phone or by letter within the next 1-3 weeks.  Please call us at [phone] if you have not heard about the biopsies in 3 weeks.    SIGNATURES/CONFIDENTIALITY: You and/or your care partner have signed paperwork which will be entered into your electronic medical record.  These signatures attest to the fact that that the information above on your After Visit Summary has been reviewed and is understood.  Full responsibility of the confidentiality of this discharge information lies with you and/or your care-partner.  Thank you for letting us take care of your healthcare needs today. 

## 2017-01-10 NOTE — Progress Notes (Signed)
Patient with wet sounding cough throughout admissions process. Only stopped coughing when patient is not talking and HOB elevated. HOB elevated 30 degrees for patient comfort.

## 2017-01-10 NOTE — Op Note (Signed)
[facility] Patient Name: Jamie Cox Procedure Date: 01/10/2017 10:47 AM MRN: [id number] Endoscopist: Sherilyn Cooter L. Myrtie Neither , MD Age: 56 Referring MD:  Date of Birth: [birth year] Gender: Female Account #: [id number] Procedure:                Upper GI endoscopy Indications:              Chronic cough Medicines:                Monitored Anesthesia Care Procedure:                Pre-Anesthesia Assessment:                           - Prior to the procedure, a History and Physical                            was performed, and patient medications and                            allergies were reviewed. The patient's tolerance of                            previous anesthesia was also reviewed. The risks                            and benefits of the procedure and the sedation                            options and risks were discussed with the patient.                            All questions were answered, and informed consent                            was obtained. Prior Anticoagulants: The patient has                            taken no previous anticoagulant or antiplatelet                            agents. ASA Grade Assessment: III - A patient with                            severe systemic disease. After reviewing the risks                            and benefits, the patient was deemed in                            satisfactory condition to undergo the procedure.                           After obtaining informed consent, the endoscope was  passed under direct vision. Throughout the                            procedure, the patient's blood pressure, pulse, and                            oxygen saturations were monitored continuously. The                            Endoscope was introduced through the mouth, and                            advanced to the second part of duodenum. The upper                            GI endoscopy was accomplished without  difficulty.                            The patient tolerated the procedure well. Scope In: Scope Out: Findings:                 The larynx was normal.                           A small hiatal hernia was present.                           The stomach was normal.                           The cardia and gastric fundus were normal on                            retroflexion.                           The examined duodenum was normal. Complications:            No immediate complications. Estimated Blood Loss:     Estimated blood loss: none. Impression:               - Normal larynx.                           - Small hiatal hernia.                           - Normal stomach.                           - Normal examined duodenum.                           - No specimens collected.                           GERD does not appear to be contributing to chronic  cough. Recommendation:           - Patient has a contact number available for                            emergencies. The signs and symptoms of potential                            delayed complications were discussed with the                            patient. Return to normal activities tomorrow.                            Written discharge instructions were provided to the                            patient.                           - Resume previous diet.                           - Continue present medications.  L. Myrtie Neither, MD 01/10/2017 11:03:52 AM This report has been signed electronically.

## 2017-01-10 NOTE — Progress Notes (Signed)
Spontaneous respirations throughout. VSS. Resting comfortably. To PACU on room air. Report to  Sara RN. 

## 2017-01-11 ENCOUNTER — Telehealth: Payer: Self-pay | Admitting: *Deleted

## 2017-01-11 NOTE — Telephone Encounter (Signed)
Message left

## 2017-02-13 ENCOUNTER — Ambulatory Visit (INDEPENDENT_AMBULATORY_CARE_PROVIDER_SITE_OTHER): Payer: BC Managed Care – PPO | Admitting: Family Medicine

## 2017-02-13 ENCOUNTER — Encounter: Payer: Self-pay | Admitting: Family Medicine

## 2017-02-13 DIAGNOSIS — J4540 Moderate persistent asthma, uncomplicated: Secondary | ICD-10-CM

## 2017-02-13 DIAGNOSIS — R05 Cough: Secondary | ICD-10-CM

## 2017-02-13 DIAGNOSIS — J45901 Unspecified asthma with (acute) exacerbation: Secondary | ICD-10-CM | POA: Insufficient documentation

## 2017-02-13 DIAGNOSIS — J45909 Unspecified asthma, uncomplicated: Secondary | ICD-10-CM | POA: Insufficient documentation

## 2017-02-13 DIAGNOSIS — M069 Rheumatoid arthritis, unspecified: Secondary | ICD-10-CM

## 2017-02-13 DIAGNOSIS — D539 Nutritional anemia, unspecified: Secondary | ICD-10-CM | POA: Diagnosis not present

## 2017-02-13 DIAGNOSIS — R053 Chronic cough: Secondary | ICD-10-CM

## 2017-02-13 LAB — CBC WITH DIFFERENTIAL/PLATELET
BASOS PCT: 1.8 % (ref 0.0–3.0)
Basophils Absolute: 0.1 10*3/uL (ref 0.0–0.1)
EOS ABS: 0 10*3/uL (ref 0.0–0.7)
EOS PCT: 0.7 % (ref 0.0–5.0)
HCT: 34 % — ABNORMAL LOW (ref 36.0–46.0)
Hemoglobin: 10.3 g/dL — ABNORMAL LOW (ref 12.0–15.0)
LYMPHS PCT: 19.3 % (ref 12.0–46.0)
Lymphs Abs: 0.9 10*3/uL (ref 0.7–4.0)
MCHC: 30.4 g/dL (ref 30.0–36.0)
MCV: 76.6 fl — AB (ref 78.0–100.0)
MONOS PCT: 11.8 % (ref 3.0–12.0)
Monocytes Absolute: 0.5 10*3/uL (ref 0.1–1.0)
Neutro Abs: 3.1 10*3/uL (ref 1.4–7.7)
Neutrophils Relative %: 66.4 % (ref 43.0–77.0)
Platelets: 214 10*3/uL (ref 150.0–400.0)
RBC: 4.43 Mil/uL (ref 3.87–5.11)
RDW: 16.1 % — AB (ref 11.5–15.5)
WBC: 4.6 10*3/uL (ref 4.0–10.5)

## 2017-02-13 MED ORDER — METHYLPREDNISOLONE ACETATE 40 MG/ML IJ SUSP
40.0000 mg | Freq: Once | INTRAMUSCULAR | Status: AC
  Administered 2017-02-13: 40 mg via INTRAMUSCULAR

## 2017-02-13 MED ORDER — HYDROCODONE-HOMATROPINE 5-1.5 MG/5ML PO SYRP: 5.0000 mL | ORAL_SOLUTION | Freq: Three times a day (TID) | ORAL | 0 refills | Status: DC | PRN

## 2017-02-13 MED ORDER — METHYLPREDNISOLONE 4 MG PO TABS: ORAL_TABLET | ORAL | 0 refills | Status: DC

## 2017-02-13 MED ORDER — AMOXICILLIN 500 MG PO CAPS: 500.0000 mg | ORAL_CAPSULE | Freq: Three times a day (TID) | ORAL | 0 refills | Status: DC

## 2017-02-13 NOTE — Patient Instructions (Signed)
Encouraged increased rest and hydration, add probiotics, zinc such as Coldeze or Xicam. Treat fevers as needed. Elderberry liquid, vitamin c Asthma, Adult Asthma is a condition of the lungs in which the airways tighten and narrow. Asthma can make it hard to breathe. Asthma cannot be cured, but medicine and lifestyle changes can help control it. Asthma may be started (triggered) by:  Animal skin flakes (dander).  Dust.  Cockroaches.  Pollen.  Mold.  Smoke.  Cleaning products.  Hair sprays or aerosol sprays.  Paint fumes or strong smells.  Cold air, weather changes, and winds.  Crying or laughing hard.  Stress.  Certain medicines or drugs.  Foods, such as dried fruit, potato chips, and sparkling grape juice.  Infections or conditions (colds, flu).  Exercise.  Certain medical conditions or diseases.  Exercise or tiring activities.  Follow these instructions at home:  Take medicine as told by your doctor.  Use a peak flow meter as told by your doctor. A peak flow meter is a tool that measures how well the lungs are working.  Record and keep track of the peak flow meter's readings.  Understand and use the asthma action plan. An asthma action plan is a written plan for taking care of your asthma and treating your attacks.  To help prevent asthma attacks: ? Do not smoke. Stay away from secondhand smoke. ? Change your heating and air conditioning filter often. ? Limit your use of fireplaces and wood stoves. ? Get rid of pests (such as roaches and mice) and their droppings. ? Throw away plants if you see mold on them. ? Clean your floors. Dust regularly. Use cleaning products that do not smell. ? Have someone vacuum when you are not home. Use a vacuum cleaner with a HEPA filter if possible. ? Replace carpet with wood, tile, or vinyl flooring. Carpet can trap animal skin flakes and dust. ? Use allergy-proof pillows, mattress covers, and box spring covers. ? Wash bed  sheets and blankets every week in hot water and dry them in a dryer. ? Use blankets that are made of polyester or cotton. ? Clean bathrooms and kitchens with bleach. If possible, have someone repaint the walls in these rooms with mold-resistant paint. Keep out of the rooms that are being cleaned and painted. ? Wash hands often. Contact a doctor if:  You have make a whistling sound when breaking (wheeze), have shortness of breath, or have a cough even if taking medicine to prevent attacks.  The colored mucus you cough up (sputum) is thicker than usual.  The colored mucus you cough up changes from clear or white to yellow, green, gray, or bloody.  You have problems from the medicine you are taking such as: ? A rash. ? Itching. ? Swelling. ? Trouble breathing.  You need reliever medicines more than 2-3 times a week.  Your peak flow measurement is still at 50-79% of your personal best after following the action plan for 1 hour.  You have a fever. Get help right away if:  You seem to be worse and are not responding to medicine during an asthma attack.  You are short of breath even at rest.  You get short of breath when doing very little activity.  You have trouble eating, drinking, or talking.  You have chest pain.  You have a fast heartbeat.  Your lips or fingernails start to turn blue.  You are light-headed, dizzy, or faint.  Your peak flow is less than 50%  of your personal best. This information is not intended to replace advice given to you by your health care provider. Make sure you discuss any questions you have with your health care provider. Document Released: 11/09/2007 Document Revised: 10/29/2015 Document Reviewed: 12/20/2012 Elsevier Interactive Patient Education  2017 [employer].

## 2017-02-13 NOTE — Assessment & Plan Note (Signed)
Stable on low dose Prednisone. Follows with rheumatology

## 2017-02-13 NOTE — Assessment & Plan Note (Signed)
Has been seen by allergist and diagnosed with moderate persistent asthma. Has not been using her Symbicort regularly, will use regularly and Albuterol liberally this week. Referred to pulmonology for further consideration

## 2017-02-13 NOTE — Assessment & Plan Note (Signed)
Worse over past 3 weeks. Given a shot of Depo Medrol IM and then start Medrol dosepak. hydromet prn. Amoxicillin if worsens out of work til 9/13. Encouraged increased rest and hydration, add probiotics, zinc such as Coldeze or Xicam. Treat fevers as needed

## 2017-02-13 NOTE — Assessment & Plan Note (Signed)
Worse with last blood draw. Repeat cbc today. Increase leafy greens, consider increased lean red meat and using cast iron cookware. Continue to monitor, report any concerns

## 2017-02-13 NOTE — Progress Notes (Signed)
Subjective:  I acted as a Neurosurgeon for Dr. Abner Greenspan. [place], [place]  Patient ID: Jamie Cox, female    DOB: 12/24/1960, 56 y.o.   MRN: [id number]  Chief Complaint  Patient presents with  . Cough    HPI  Patient is in today for a cough she states it has been getting worse over the past few weeks.The cough is productive with clear mucous. She has no fever but her cough is currently keeping her up at night and causing excessive fatigue. Her cough is day and night but worse with exertion and eating. Responds to Albuterol some. She is not using her Symbicort regularly unfortunately. Denies pal/HA/congestion/fevers/GI or GU c/o. Taking meds as prescribed. Notes some chest pain with coughing.   Patient Care Team: [name], MD as PCP - General (Family Medicine) Mardella Layman, MD as Consulting Physician (Gastroenterology)   Past Medical History:  Diagnosis Date  . Allergy   . Asthma   . Chronic sinusitis    Followed by Dr. Jenne Pane  . Environmental allergies   . GERD (gastroesophageal reflux disease)   . 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 History of chicken pox   . Hypercalcemia 07/19/2015  . Osteoporosis, unspecified    steroid induced  . Personal history of other diseases of circulatory system   . Rheumatoid arthritis(714.0)   . Unspecified deficiency anemia    microcytic  . Urticaria     Past Surgical History:  Procedure Laterality Date  . NASAL SINUS SURGERY  06/2012    Family History  Problem Relation Age of Onset  . Liver cancer Maternal Grandmother   . Hypertension Maternal Grandmother   . Heart attack Maternal Grandmother   . Scleroderma Mother   . Heart disease Mother   . Kidney disease Mother   . Prostate cancer Father   . Diabetes Father   . Heart disease Father        CHF  . Arthritis Father        s/p hip replacement  . Cancer Father        lung cancer with mets, smoker  . Lung cancer Paternal Uncle   . Coronary artery disease  Paternal Grandmother   . Hypertension Paternal Grandmother   . Liver cancer Paternal Grandmother   . Hyperlipidemia Son   . Alzheimer's disease Paternal Grandfather   . Other Sister        Cardiomegaly  . Arthritis Sister   . Heart disease Sister        rare  . Sleep apnea Brother   . Arthritis Daughter   . Lupus Cousin   . Arthritis Cousin   . Colon cancer Neg Hx     Social History   Social History  . Marital status: Married    Spouse name: N/A  . Number of children: 2  . Years of education: N/A   Occupational History  . Data processing manager    Social History Main Topics  . Smoking status: Never Smoker  . Smokeless tobacco: Never Used  . Alcohol use No  . Drug use: No  . Sexual activity: Not Currently   Other Topics Concern  . Not on file   Social History Narrative  . No narrative on file    Outpatient Medications Prior to Visit  Medication Sig Dispense Refill  . acetaminophen (TYLENOL) 650 MG CR tablet Take 650 mg by mouth every 8 (eight) hours as needed. For pain    . albuterol (PROVENTIL  HFA;VENTOLIN HFA) 108 (90 Base) MCG/ACT inhaler Inhale 1-2 puffs into the lungs every 6 (six) hours as needed for wheezing or shortness of breath (cough). For shortness of breath/wheezing 3.7 g 1  . benzonatate (TESSALON PERLES) 100 MG capsule Take 2 capsules (200 mg total) by mouth 3 (three) times daily as needed for cough. 60 capsule 1  . budesonide-formoterol (SYMBICORT) 80-4.5 MCG/ACT inhaler Inhale 2 puffs into the lungs 2 (two) times daily. 1 Inhaler 3  . calcium-vitamin D (CALCIUM 500/D) 500-200 MG-UNIT per tablet 1 tablet.    . fluticasone (FLONASE) 50 MCG/ACT nasal spray Place 2 sprays into both nostrils daily. 16 g 3  . folic acid (FOLVITE) 1 MG tablet     . HEMOCYTE-F 324-1 MG TABS TAKE ONE TABLET BY MOUTH ONCE DAILY 30 each 5  . HYDROcodone-acetaminophen (NORCO/VICODIN) 5-325 MG tablet Take 1 tablet by mouth every 6 (six) hours as needed for moderate pain. July 2018 40  tablet 0  . HYDROcodone-homatropine (HYCODAN) 5-1.5 MG/5ML syrup Take 5 mLs by mouth every 8 (eight) hours as needed for cough. 120 mL 0  . hyoscyamine (LEVSIN, ANASPAZ) 0.125 MG tablet Take 1 tablet (0.125 mg total) by mouth every 4 (four) hours as needed. 30 tablet 0  . loratadine (CLARITIN) 10 MG tablet 10 mg.    . meloxicam (MOBIC) 7.5 MG tablet Take 1-2 tablets by mouth daily as needed for pain. 60 tablet 3  . methotrexate 2.5 MG tablet     . metoprolol succinate (TOPROL-XL) 25 MG 24 hr tablet TAKE 1 TABLET BY MOUTH EVERY DAY 30 tablet 0  . montelukast (SINGULAIR) 10 MG tablet TAKE ONE TABLET BY MOUTH AT BEDTIME 30 tablet 5  . Multiple Vitamin (MULTIVITAMIN) capsule Take 1 capsule by mouth daily.      . pantoprazole (PROTONIX) 40 MG tablet Take 1 tablet (40 mg total) by mouth daily. 90 tablet 1  . predniSONE (DELTASONE) 2.5 MG tablet TAKE ONE TABLET BY MOUTH ONCE DAILY WITH BREAKFAST 90 tablet 1  . ranitidine (ZANTAC) 300 MG tablet TAKE ONE-HALF TABLET BY MOUTH AT BEDTIME 90 tablet 1  . triamcinolone ointment (KENALOG) 0.1 % Apply 1 application topically 2 (two) times daily. 30 g 5  . 0.9 %  sodium chloride infusion      No facility-administered medications prior to visit.     Allergies  Allergen Reactions  . Avelox [Moxifloxacin Hcl In Nacl] Diarrhea, Nausea And Vomiting and Other (See Comments)    Dizziness, Cold chills   . Bactrim [Sulfamethoxazole-Trimethoprim] Hives  . Other Diarrhea, Nausea And Vomiting and Other (See Comments)    Dizziness, Cold chills   . Sulfamethoxazole-Trimethoprim Hives    Review of Systems  Constitutional: Positive for malaise/fatigue. Negative for fever.  HENT: Negative for congestion.   Eyes: Negative for blurred vision.  Respiratory: Positive for cough, sputum production, shortness of breath and wheezing.   Cardiovascular: Negative for chest pain, palpitations and leg swelling.  Gastrointestinal: Negative for vomiting.  Musculoskeletal:  Positive for myalgias. Negative for back pain.  Skin: Negative for rash.  Neurological: Negative for loss of consciousness and headaches.       Objective:    Physical Exam  Constitutional: She is oriented to person, place, and time. She appears well-developed and well-nourished. No distress.  HENT:  Head: Normocephalic and atraumatic.  Dry mucus membranes.   Eyes: Conjunctivae are normal.  Neck: Normal range of motion. No thyromegaly present.  Cardiovascular: Normal rate and regular rhythm.   Pulmonary/Chest: Effort  normal and breath sounds normal. She has no wheezes.  Decreased breath sounds b/l bases  Abdominal: Soft. Bowel sounds are normal. There is no tenderness.  Musculoskeletal: Normal range of motion. She exhibits no edema or deformity.  Neurological: She is alert and oriented to person, place, and time.  Skin: Skin is warm and dry. She is not diaphoretic.  Psychiatric: She has a normal mood and affect.    BP (!) 142/8 (BP Location: Left Arm, Patient Position: Sitting, Cuff Size: Normal)   Pulse 78   Temp 97.6 F (36.4 C) (Oral)   Wt 102 lb (46.3 kg)   SpO2 92%   BMI 19.27 kg/m  Wt Readings from Last 3 Encounters:  02/13/17 102 lb (46.3 kg)  01/10/17 101 lb (45.8 kg)  01/03/17 101 lb 12.8 oz (46.2 kg)   BP Readings from Last 3 Encounters:  02/13/17 (!) 142/8  01/10/17 112/64  01/03/17 100/68     Immunization History  Administered Date(s) Administered  . Influenza Split 04/05/2011  . Influenza Whole 04/10/2008, 05/15/2009, 05/21/2010, 02/05/2012  . Influenza,inj,Quad PF,6+ Mos 08/06/2013, 05/09/2016  . Pneumococcal Polysaccharide-23 07/01/2010  . Td 05/15/2009    Health Maintenance  Topic Date Due  . INFLUENZA VACCINE  01/04/2017  . MAMMOGRAM  08/06/2017  . PAP SMEAR  03/05/2019  . TETANUS/TDAP  05/16/2019  . COLONOSCOPY  10/16/2022  . Hepatitis C Screening  Completed  . HIV Screening  Completed    Lab Results  Component Value Date   WBC 6.7  11/21/2016   HGB 9.9 (L) 11/21/2016   HCT 32.0 (L) 11/21/2016   PLT 254.0 11/21/2016   GLUCOSE 77 11/21/2016   CHOL 190 07/13/2015   TRIG 111.0 07/13/2015   HDL 73.90 07/13/2015   LDLCALC 94 07/13/2015   ALT 11 11/21/2016   AST 18 11/21/2016   NA 138 11/21/2016   K 4.1 11/21/2016   CL 104 11/21/2016   CREATININE 0.68 11/21/2016   BUN 10 11/21/2016   CO2 28 11/21/2016   TSH 1.47 06/09/2016    Lab Results  Component Value Date   TSH 1.47 06/09/2016   Lab Results  Component Value Date   WBC 6.7 11/21/2016   HGB 9.9 (L) 11/21/2016   HCT 32.0 (L) 11/21/2016   MCV 72.8 (L) 11/21/2016   PLT 254.0 11/21/2016   Lab Results  Component Value Date   NA 138 11/21/2016   K 4.1 11/21/2016   CO2 28 11/21/2016   GLUCOSE 77 11/21/2016   BUN 10 11/21/2016   CREATININE 0.68 11/21/2016   BILITOT 0.2 11/21/2016   ALKPHOS 60 11/21/2016   AST 18 11/21/2016   ALT 11 11/21/2016   PROT 7.5 11/21/2016   ALBUMIN 3.9 11/21/2016   CALCIUM 9.7 11/21/2016   GFR 114.21 11/21/2016   Lab Results  Component Value Date   CHOL 190 07/13/2015   Lab Results  Component Value Date   HDL 73.90 07/13/2015   Lab Results  Component Value Date   LDLCALC 94 07/13/2015   Lab Results  Component Value Date   TRIG 111.0 07/13/2015   Lab Results  Component Value Date   CHOLHDL 3 07/13/2015   No results found for: HGBA1C       Assessment & Plan:   Problem List Items Addressed This Visit    Deficiency anemia    Worse with last blood draw. Repeat cbc today. Increase leafy greens, consider increased lean red meat and using cast iron cookware. Continue to monitor, report any  concerns      Rheumatoid arthritis (HCC)    Stable on low dose Prednisone. Follows with rheumatology      Relevant Medications   methylPREDNISolone (MEDROL) 4 MG tablet   methylPREDNISolone acetate (DEPO-MEDROL) injection 40 mg (Completed) (Start on 02/13/2017  8:30 AM)   Chronic cough    Worse over past 3 weeks.  Given a shot of Depo Medrol IM and then start Medrol dosepak. hydromet prn. Amoxicillin if worsens out of work til 9/13. Encouraged increased rest and hydration, add probiotics, zinc such as Coldeze or Xicam. Treat fevers as needed       Relevant Medications   methylPREDNISolone acetate (DEPO-MEDROL) injection 40 mg (Completed) (Start on 02/13/2017  8:30 AM)   Other Relevant Orders   Ambulatory referral to Pulmonology   CBC with Differential/Platelet   Asthma    Has been seen by allergist and diagnosed with moderate persistent asthma. Has not been using her Symbicort regularly, will use regularly and Albuterol liberally this week. Referred to pulmonology for further consideration      Relevant Medications   methylPREDNISolone (MEDROL) 4 MG tablet   methylPREDNISolone acetate (DEPO-MEDROL) injection 40 mg (Completed) (Start on 02/13/2017  8:30 AM)      I am having Ms. Rios start on methylPREDNISolone, HYDROcodone-homatropine, and amoxicillin. I am also having her maintain her acetaminophen, multivitamin, loratadine, calcium-vitamin D, predniSONE, fluticasone, pantoprazole, hyoscyamine, albuterol, budesonide-formoterol, HEMOCYTE-F, meloxicam, ranitidine, montelukast, folic acid, methotrexate, triamcinolone ointment, HYDROcodone-acetaminophen, benzonatate, HYDROcodone-homatropine, and metoprolol succinate. We will stop administering sodium chloride. Additionally, we administered methylPREDNISolone acetate.  Meds ordered this encounter  Medications  . methylPREDNISolone (MEDROL) 4 MG tablet    Sig: 5 tab po qd X 1d then 4 tab po qd X 1d then 3 tab po qd X 1d then 2 tab po qd then 1 tab po qd    Dispense:  15 tablet    Refill:  0  . HYDROcodone-homatropine (HYCODAN) 5-1.5 MG/5ML syrup    Sig: Take 5 mLs by mouth every 8 (eight) hours as needed for cough.    Dispense:  120 mL    Refill:  0  . methylPREDNISolone acetate (DEPO-MEDROL) injection 40 mg  . amoxicillin (AMOXIL) 500 MG capsule     Sig: Take 1 capsule (500 mg total) by mouth 3 (three) times daily.    Dispense:  30 capsule    Refill:  0    CMA served as scribe during this visit. History, Physical and Plan performed by medical provider. Documentation and orders reviewed and attested to.  Danise Edge, MD

## 2017-02-21 ENCOUNTER — Ambulatory Visit: Payer: BC Managed Care – PPO | Admitting: Internal Medicine

## 2017-03-06 ENCOUNTER — Ambulatory Visit (INDEPENDENT_AMBULATORY_CARE_PROVIDER_SITE_OTHER): Payer: BC Managed Care – PPO | Admitting: Internal Medicine

## 2017-03-06 ENCOUNTER — Other Ambulatory Visit: Payer: Self-pay | Admitting: Family Medicine

## 2017-03-06 ENCOUNTER — Encounter: Payer: Self-pay | Admitting: Internal Medicine

## 2017-03-06 ENCOUNTER — Encounter: Payer: Self-pay | Admitting: *Deleted

## 2017-03-06 VITALS — BP 98/60 | HR 92 | Ht 61.0 in | Wt 101.8 lb

## 2017-03-06 DIAGNOSIS — R053 Chronic cough: Secondary | ICD-10-CM

## 2017-03-06 DIAGNOSIS — R05 Cough: Secondary | ICD-10-CM | POA: Diagnosis not present

## 2017-03-06 DIAGNOSIS — J479 Bronchiectasis, uncomplicated: Secondary | ICD-10-CM | POA: Diagnosis not present

## 2017-03-06 DIAGNOSIS — K219 Gastro-esophageal reflux disease without esophagitis: Secondary | ICD-10-CM

## 2017-03-06 NOTE — Patient Instructions (Signed)
Pantoprazole (protonix) 40 mg   Take  30-60 min before first meal of the day and zantac 300 mg one @  bedtime until return to office - this is the best way to tell whether stomach acid is contributing to your problem.    Please see patient coordinator before you leave today  to schedule methacholine challenge test in 2 weeks no sooner    Please schedule a follow up office visit in 4 weeks, sooner if needed

## 2017-03-06 NOTE — Progress Notes (Signed)
Subjective:    Patient ID: Jamie Cox, female    DOB: 1960-08-07 .   MRN: [id number]   Brief patient profile:   85  yobf never smoker dx RA in her early 20's steroid dep x around 2000 but also mtx, plaquenil in past per Dr Coral Spikes and also seen [facility] and also at [facility] with ? 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     History of Present Illness  August 10, 2010 1st pulmonary office eval cc for cough starting around 2006 but much worse x 4 months with constant need to clear the throat, mucus is clear, cough so hard looses breath. Much worse then ended in ER with dx of pna with cp on R rx as outpt with abx and cp now gone but still coughing. arthritis worse x 4 months and plans to go establish at [facility].  Imp was [place] plus/minus uacs  rec  1) Prednsione 10 mg 4 each am x 2days, 2x2days, 1x2days and stop and resume prednisone 2.5 mg daily  2) Stop boniva  3) Start Nexium Take one 30-60 min before first meal of the day and also Zantac 150 mg one at bedtime  4) GERD (REFLUX) diet   09/14/10  ov cc cough dry day = night,  pred at 2.5 mg per day cough was better on the higher doses but plan is to start plaquenil  per [facility].  Mostly dry.   >>Sinus CT pos+>Augmentin x 10d , zantac At bedtime       Sinus surgery in Jan 2014 > marked improvement         05/27/2016  Consultation/ re-establish /Jamie Cox re: recurrent cough in pt with RA pred 2.5 mg daily  Chief Complaint  Patient presents with  . Pulm Consult    Productive cough x4 months ago. Cough has been getting worse. Cloudy white phlegm.   on qvar 40 on avg 2puff daily and not using saba appropriately/ poor hfa  Onset was insidious x 4 m prior to OV  With progressively worse 24/7 cough / very congested with grey mucus  Doe = MMRC2 = can't walk a nl pace on a flat grade s sob but does fine slow and flat eg shopping Cough worse with exp to fumes/hot air not better on qvar  - better in cooler conditions rec Stop qvar  Start symbicort 80  Take 2 puffs first thing in am and then another 2 puffs about 12 hours later.  Work on inhaler technique: Only use your albuterol as a rescue medication For cough > mucinex dm 1200 mg every 12 hours and the flutter valve as much as possible  Augmentin 875 mg take one pill twice daily  X 10 days -    06/28/2016  f/u ov/Jamie Cox re: bronchiectasis s significant airflow obst by pfts  And sinusitis by ct  Chief Complaint  Patient presents with  . Follow-up    4 week follow up with PFT results. Still coughing.    worst time of cough at hs - overall at least 50% better, aware of purulent nasal discharge improves only while on augmentin and has not seen Dr Jenne Pane in years / not needing any saba on symb 80 2bid  > Jenne Pane 08/18/16 neg for infection > rec allergy eval   Allergy eval >  10/19/16  [place]  1. Chronic rhinitis - We cannot do testing today because you have take Claritin.  - Stop the fluticasone and start Dymista two sprays per nostril daily. -  Use nasal saline rinses prior to using the Dymista.  - Start the prednisone pack provided today: 10mg  twice daily for four days and then 10mg  once on the fifth day - Once we do testing, we can discuss further management strategies > neg histamine controls > neg RAST  2. Moderate persistent asthma, uncomplicated - Lung testing showed  - We will defer to Dr. Sherene Sires for management of your asthma and cough.  - Continue with the Symbicort two puffs twice daily. - Use a spacer to improve delivery of the Symbicort into your lungs.        03/06/2017  f/u ov/Jamie Cox re: bronchiectasis/ sinusitis/ chronic cough  Chief Complaint  Patient presents with  . Follow-up    Pt states had been coughing more- better since she took round of amoxicillin.  She denies any co's today. She uses her albuterol 2 x per wk on average.    Albuterol seems to help uses twice a week  No noct need for saba and really minimal noct or early am cough  Temp changes triggers cough as  well as exp to fumes but says cough is presently day > noct x "forever" even when she stays in same temp away from fumes No longer on GERD Rx Not limited by breathing from desired activities    No obvious day to day or daytime variability or assoc excess/ purulent sputum or mucus plugs or hemoptysis or cp or chest tightness, subjective wheeze or overt sinus or hb symptoms. No unusual exp hx or h/o childhood pna/ asthma or knowledge of premature birth.  Sleeping ok flat without nocturnal  or early am exacerbation  of respiratory  c/o's or need for noct saba. Also denies any obvious fluctuation of symptoms with weather or environmental changes or other aggravating or alleviating factors except as outlined above   Current Allergies, Complete Past Medical History, Past Surgical History, Family History, and Social History were reviewed in [employer] record.  ROS  The following are not active complaints unless bolded Hoarseness, sore throat, dysphagia, dental problems, itching, sneezing,  nasal congestion or discharge of excess mucus or purulent secretions, ear ache,   fever, chills, sweats, unintended wt loss or wt gain, classically pleuritic or exertional cp,  orthopnea pnd or leg swelling, presyncope, palpitations, abdominal pain, anorexia, nausea, vomiting, diarrhea  or change in bowel habits or change in bladder habits, change in stools or change in urine, dysuria, hematuria,  rash, arthralgias, visual complaints, headache, numbness, weakness or ataxia or problems with walking or coordination,  change in mood/affect or memory.        Current Meds - unable to verify accuracy of exactly what she takes   Medication Sig  . acetaminophen (TYLENOL) 650 MG CR tablet Take 650 mg by mouth every 8 (eight) hours as needed. For pain  . albuterol (PROVENTIL HFA;VENTOLIN HFA) 108 (90 Base) MCG/ACT inhaler Inhale 1-2 puffs into the lungs every 6 (six) hours as needed for wheezing or  shortness of breath (cough). For shortness of breath/wheezing  . budesonide-formoterol (SYMBICORT) 80-4.5 MCG/ACT inhaler Inhale 2 puffs into the lungs 2 (two) times daily.  . calcium-vitamin D (CALCIUM 500/D) 500-200 MG-UNIT per tablet 1 tablet.  . fluticasone (FLONASE) 50 MCG/ACT nasal spray Place 2 sprays into both nostrils daily.  Marland Kitchen HEMOCYTE-F 324-1 MG TABS TAKE ONE TABLET BY MOUTH ONCE DAILY  . HYDROcodone-acetaminophen (NORCO/VICODIN) 5-325 MG tablet Take 1 tablet by mouth every 6 (six) hours as needed for moderate pain.  July 2018  . hyoscyamine (LEVSIN, ANASPAZ) 0.125 MG tablet Take 1 tablet (0.125 mg total) by mouth every 4 (four) hours as needed.  . loratadine (CLARITIN) 10 MG tablet 10 mg.  . meloxicam (MOBIC) 7.5 MG tablet Take 1-2 tablets by mouth daily as needed for pain.  . meloxicam (MOBIC) 7.5 MG tablet TAKE ONE TO TWO TABLETS BY MOUTH ONCE DAILY AS NEEDED FOR PAIN  . methotrexate 2.5 MG tablet   . metoprolol succinate (TOPROL-XL) 25 MG 24 hr tablet TAKE 1 TABLET BY MOUTH EVERY DAY  . montelukast (SINGULAIR) 10 MG tablet TAKE ONE TABLET BY MOUTH AT BEDTIME  . Multiple Vitamin (MULTIVITAMIN) capsule Take 1 capsule by mouth daily.    . pantoprazole (PROTONIX) 40 MG tablet TAKE ONE TABLET BY MOUTH ONCE DAILY  . predniSONE (DELTASONE) 2.5 MG tablet TAKE ONE TABLET BY MOUTH ONCE DAILY WITH BREAKFAST  . ranitidine (ZANTAC) 300 MG tablet TAKE ONE-HALF TABLET BY MOUTH AT BEDTIME  . triamcinolone ointment (KENALOG) 0.1 % Apply 1 application topically 2 (two) times daily.  . [DISCONTINUED] benzonatate (TESSALON PERLES) 100 MG capsule Take 2 capsules (200 mg total) by mouth 3 (three) times daily as needed for cough.  . [DISCONTINUED] HYDROcodone-homatropine (HYCODAN) 5-1.5 MG/5ML syrup Take 5 mLs by mouth every 8 (eight) hours as needed for cough.                 Objective:   Physical Exam  Chronically ill amb bf with cough on exp    .03/06/2017    101  06/28/2016       98     05/27/16 98 lb 12.8 oz (44.8 kg)  05/09/16 101 lb 3.2 oz (45.9 kg)  05/04/16 100 lb (45.4 kg)   Wt 105 12/23/2010    Vital signs reviewed  - Note on arrival 02 sats  98% on RA   HEENT: nl dentition,  bilaterally, and oropharynx. Nl external ear canals without cough reflex - moderate bilateral non-specific turbinate edema  - no excess secretions    NECK :  without JVD/Nodes/TM/ nl carotid upstrokes bilaterally   LUNGS: no acc muscle use,  Nl contour chest which is clear to A and P bilaterally without cough on insp or exp maneuvers   CV:  RRR  no s3 or murmur or increase in P2, and no edema   ABD:  soft and nontender with nl inspiratory excursion in the supine position. No bruits or organomegaly appreciated, bowel sounds nl  MS:  Nl gait/ ext warm without deformities, calf tenderness, cyanosis or clubbing RA changes moderate both hands/ MCPs s ulnar deviation    SKIN: warm and dry without lesions    NEURO:  alert, approp, nl sensorium with  no motor or cerebellar deficits apparent.          I personally reviewed images and agree with radiology impression as follows:  HRCT  Chest  06/28/2016  progression of widespread areas of cylindrical bronchiectasis throughout the lung bases bilaterally, most severe in the lower lobes but also involving the right middle lobe and inferior segment of the lingula. In the areas of greatest involvement there is associated thickening of the peribronchovascular interstitium with some adjacent septal thickening, architectural distortion and peribronchovascular micro and macronodularity. Relative sparing of the mid to upper lungs      Assessment & Plan:

## 2017-03-07 NOTE — Assessment & Plan Note (Signed)
See HRCT 06/28/2016 assoc with pansinusitis > referred to [facility] > 08/18/16 no evidence infection > rec allergy eval > neg RAST  - PFT's  06/28/2016   Nl obstruction p am symbicort 80 x 2   Presently only evidence there is an airway problem is cough on expiration and better on saba  despite reported rx with symbicort so next step is MCT to see if higher dose singulair needed / no further abx indicated

## 2017-03-07 NOTE — Assessment & Plan Note (Signed)
CT sinus 09/14/10 Positive >10 d of Augmentin - Recheck sinus ct 12/24/10 > improved  - Improved p sinus surgery 06/2012  - PFT's  05/06/2016  FEV1 1.43 (79 % ) ratio 82  p 0 % improvement from saba p nothing prior to study with DLCO  45 % corrects to 51  % for alv volume   - 05/27/2016 re established rec augmentinx 10 days/ flutter valve training - 05/27/2016  After extensive coaching HFA effectiveness =    75% try symbicort 80 2bid  - CT sinus 06/28/2016 Short air-fluid levels within the maxillary sinuses bilaterally as well as sphenoid sinuses compatible with acute sinusitis rec augmentin x 20 days then  ent at 21 days Jenne Pane)  - PFT's  06/28/2016  FEV1 1.62 (90 % ) ratio 83  p 2 % improvement from saba p symbicort 80 x2 pffs prior to study with DLCO  71/69 % corrects to 106  % for alv volume   - 03/06/2017 rec max gerd rx then MCT in 2 weeks  The most common causes of chronic cough in immunocompetent adults include the following: upper airway cough syndrome (UACS), previously referred to as postnasal drip syndrome (PNDS), which is caused by variety of rhinosinus conditions; (2) asthma; (3) GERD; (4) chronic bronchitis from cigarette smoking or other inhaled environmental irritants; (5) nonasthmatic eosinophilic bronchitis; and (6) bronchiectasis.   These conditions, singly or in combination, have accounted for up to 94% of the causes of chronic cough in prospective studies.   Other conditions have constituted no >6% of the causes in prospective studies These have included bronchogenic carcinoma, chronic interstitial pneumonia, sarcoidosis, left ventricular failure, ACEI-induced cough, and aspiration from a condition associated with pharyngeal dysfunction.    Chronic cough is often simultaneously caused by more than one condition. A single cause has been found from 38 to 82% of the time, multiple causes from 18 to 62%. Multiply caused cough has been the result of three diseases up to 42% of the time.        This cough has been present for years c/w sinusitis/ bronchiectasis but also has features of asthma and UACS so need to try to sort out the ddx better starting with a MCT to be done on max gerd rx then return with all meds in hand using a trust but verify approach to confirm accurate Medication  Reconciliation The principal here is that until we are certain that the  patients are doing what we've asked, it makes no sense to ask them to do more.    .I had an extended discussion with the patient reviewing all relevant studies (extensive records review since last ov with ent/allergy notes in EPIC) completed to date and  lasting 25  minutes of a 40 minute visit  To re- establish with me re dx and management of chronic cough   Each maintenance medication was reviewed in detail including most importantly the difference between maintenance and prns and under what circumstances the prns are to be triggered using an action plan format that is not reflected in the computer generated alphabetically organized AVS.    Please see AVS for specific instructions unique to this visit that I personally wrote and verbalized to the the pt in detail and then reviewed with pt  by my nurse highlighting any  changes in therapy recommended at today's visit to their plan of care.

## 2017-03-21 ENCOUNTER — Ambulatory Visit (HOSPITAL_COMMUNITY)
Admission: RE | Admit: 2017-03-21 | Discharge: 2017-03-21 | Disposition: A | Discharge status: Home / Self Care | Payer: BC Managed Care – PPO | Source: Ambulatory Visit | Attending: Internal Medicine | Admitting: Internal Medicine

## 2017-03-21 DIAGNOSIS — R05 Cough: Secondary | ICD-10-CM

## 2017-03-21 DIAGNOSIS — R053 Chronic cough: Secondary | ICD-10-CM

## 2017-03-27 ENCOUNTER — Other Ambulatory Visit: Payer: Self-pay | Admitting: Family Medicine

## 2017-03-29 ENCOUNTER — Ambulatory Visit (HOSPITAL_COMMUNITY)
Admission: RE | Admit: 2017-03-29 | Discharge: 2017-03-29 | Disposition: A | Discharge status: Home / Self Care | Payer: BC Managed Care – PPO | Source: Ambulatory Visit | Attending: Internal Medicine | Admitting: Internal Medicine

## 2017-03-29 NOTE — Progress Notes (Signed)
Patient here to do Methacholine challenge.  Attempted to do test; patient unable to perform due to uncontrollable cough.  Advised patient to call MD, and to reschedule at her convienence.

## 2017-03-30 ENCOUNTER — Other Ambulatory Visit: Payer: Self-pay

## 2017-03-30 ENCOUNTER — Telehealth: Payer: Self-pay | Admitting: Internal Medicine

## 2017-03-30 ENCOUNTER — Other Ambulatory Visit: Payer: Self-pay | Admitting: *Deleted

## 2017-03-30 DIAGNOSIS — M069 Rheumatoid arthritis, unspecified: Secondary | ICD-10-CM

## 2017-03-30 LAB — PULMONARY FUNCTION TEST
FEF 25-75 Pre: 0.97 L/s
FEF2575-%Pred-Pre: 50 %
FEV1-%Pred-Pre: 87 %
FEV1-Pre: 1.63 L
FEV1FVC-%Pred-Pre: 89 %
FEV6-%Pred-Pre: 95 %
FEV6-Pre: 2.18 L
FEV6FVC-%Pred-Pre: 99 %
FVC-%Pred-Pre: 96 %
FVC-Pre: 2.28 L
Pre FEV1/FVC ratio: 71 %
Pre FEV6/FVC Ratio: 96 %

## 2017-03-30 MED ORDER — HYDROCODONE-ACETAMINOPHEN 5-325 MG PO TABS: 1.0000 | ORAL_TABLET | Freq: Four times a day (QID) | ORAL | 0 refills | Status: DC | PRN

## 2017-03-30 MED ORDER — BENZONATATE 100 MG PO CAPS: 200.0000 mg | ORAL_CAPSULE | Freq: Three times a day (TID) | ORAL | 1 refills | Status: DC | PRN

## 2017-03-30 NOTE — Telephone Encounter (Signed)
Patient made rx wa ready for pick up

## 2017-03-30 NOTE — Telephone Encounter (Signed)
Copied from CRM #[id number]. Topic: Quick Communication - See Telephone Encounter >> Mar 30, 2017  9:04 AM Gigi Gin, NT wrote: CRM for notification. See Telephone encounter for:  03/30/17.  Patient needs a refill of her hydrocodone, she also states she would like a refill of a cough syrup she use to take. Patient is complaining of having a cough and can't complete a asthma test.

## 2017-03-30 NOTE — Telephone Encounter (Signed)
lmomtcb x 1 for the pt 

## 2017-03-30 NOTE — Telephone Encounter (Signed)
Please advise on cough medication as well   Requesting:Norco Contract:Yes ZTI:WPYK screen 09/29/16 Last OV:02/13/17 Next OV: 05/08/17 Last Refill:11/21/16   #[id number]   Please advise

## 2017-03-31 NOTE — Telephone Encounter (Signed)
Called and spoke with pt and she stated that she went to have the methacholine challenge and she could not finish tthe test due to her cough.  She stated that Dr. Rogelia Rohrer called her in some tessalon to see if that would help and she will try to complete the test before her appt with MW on 11/1.

## 2017-04-06 ENCOUNTER — Ambulatory Visit: Payer: BC Managed Care – PPO | Admitting: Internal Medicine

## 2017-04-06 MED FILL — HYDROCODON-APAP 5-325: 5-325 | 10 days supply | Qty: 40 | Fill #0

## 2017-04-13 ENCOUNTER — Telehealth: Payer: Self-pay | Admitting: Family Medicine

## 2017-04-13 MED ORDER — RANITIDINE HCL 300 MG PO TABS: 300.0000 mg | ORAL_TABLET | Freq: Every day | ORAL | 1 refills | Status: DC

## 2017-04-13 NOTE — Telephone Encounter (Signed)
rx sent to [facility]    Patient has 2 pharmacies listed

## 2017-04-13 NOTE — Telephone Encounter (Signed)
RANITIDINE 300 MG INCREASE TO ONE TAB AT BEDTIME pt states pulmonologist Dr Sherene Sires recommend increase from half tab at bedtime to one whole tab at bedtime. Please call in 90 day supply.

## 2017-04-17 ENCOUNTER — Ambulatory Visit: Payer: BC Managed Care – PPO | Admitting: Internal Medicine

## 2017-04-25 ENCOUNTER — Other Ambulatory Visit: Payer: Self-pay | Admitting: Family Medicine

## 2017-04-25 DIAGNOSIS — R05 Cough: Secondary | ICD-10-CM

## 2017-04-25 DIAGNOSIS — R053 Chronic cough: Secondary | ICD-10-CM

## 2017-05-04 ENCOUNTER — Telehealth: Payer: Self-pay | Admitting: Family Medicine

## 2017-05-04 NOTE — Telephone Encounter (Signed)
error 

## 2017-05-05 ENCOUNTER — Ambulatory Visit: Payer: BC Managed Care – PPO | Admitting: Internal Medicine

## 2017-05-05 ENCOUNTER — Ambulatory Visit: Payer: BC Managed Care – PPO | Admitting: Family Medicine

## 2017-05-05 ENCOUNTER — Encounter: Payer: Self-pay | Admitting: Internal Medicine

## 2017-05-05 VITALS — BP 116/70 | HR 104 | Ht 61.0 in | Wt 102.0 lb

## 2017-05-05 DIAGNOSIS — B351 Tinea unguium: Secondary | ICD-10-CM | POA: Diagnosis not present

## 2017-05-05 DIAGNOSIS — K219 Gastro-esophageal reflux disease without esophagitis: Secondary | ICD-10-CM

## 2017-05-05 DIAGNOSIS — D539 Nutritional anemia, unspecified: Secondary | ICD-10-CM | POA: Diagnosis not present

## 2017-05-05 DIAGNOSIS — J45991 Cough variant asthma: Secondary | ICD-10-CM | POA: Diagnosis not present

## 2017-05-05 DIAGNOSIS — D649 Anemia, unspecified: Secondary | ICD-10-CM

## 2017-05-05 DIAGNOSIS — J479 Bronchiectasis, uncomplicated: Secondary | ICD-10-CM | POA: Diagnosis not present

## 2017-05-05 DIAGNOSIS — M810 Age-related osteoporosis without current pathological fracture: Secondary | ICD-10-CM

## 2017-05-05 DIAGNOSIS — M069 Rheumatoid arthritis, unspecified: Secondary | ICD-10-CM

## 2017-05-05 MED ORDER — AMOXICILLIN-POT CLAVULANATE 875-125 MG PO TABS
ORAL_TABLET | ORAL | 0 refills | Status: DC
Start: 2017-05-05 — End: 2017-07-17

## 2017-05-05 MED ORDER — PREDNISONE 10 MG PO TABS: ORAL_TABLET | ORAL | 0 refills | Status: DC

## 2017-05-05 NOTE — Progress Notes (Signed)
Subjective:    Patient ID: Jamie Cox, female    DOB: June 03, 1961 .   MRN: [id number]   Brief patient profile:   59  yobf never smoker dx RA in her early 20's steroid dep x around 2000 but also mtx, plaquenil in past per Dr Coral Spikes and also seen [facility] and also at [place] with ? ILD/bronchiecatasis or bronchiolitis related to RA.     History of Present Illness  August 10, 2010 1st pulmonary office eval cc for cough starting around 2006 but much worse x 4 months with constant need to clear the throat, mucus is clear, cough so hard looses breath. Much worse then ended in ER with dx of pna with cp on R rx as outpt with abx and cp now gone but still coughing. arthritis worse x 4 months and plans to go establish at [facility].  Imp was [place] plus/minus uacs  rec  1) Prednsione 10 mg 4 each am x 2days, 2x2days, 1x2days and stop and resume prednisone 2.5 mg daily  2) Stop boniva  3) Start Nexium Take one 30-60 min before first meal of the day and also Zantac 150 mg one at bedtime  4) GERD (REFLUX) diet   09/14/10  ov cc cough dry day = night,  pred at 2.5 mg per day cough was better on the higher doses but plan is to start plaquenil  per [facility].  Mostly dry.   >>Sinus CT pos+>Augmentin x 10d , zantac At bedtime       Sinus surgery in Jan 2014 > marked improvement         05/27/2016  Consultation/ re-establish /Jamie Cox re: recurrent cough in pt with RA pred 2.5 mg daily  Chief Complaint  Patient presents with  . Pulm Consult    Productive cough x4 months ago. Cough has been getting worse. Cloudy white phlegm.   on qvar 40 on avg 2puff daily and not using saba appropriately/ poor hfa  Onset was insidious x 4 m prior to OV  With progressively worse 24/7 cough / very congested with grey mucus  Doe = MMRC2 = can't walk a nl pace on a flat grade s sob but does fine slow and flat eg shopping Cough worse with exp to fumes/hot air not better on qvar  - better in cooler  conditions rec Stop qvar  Start symbicort 80 Take 2 puffs first thing in am and then another 2 puffs about 12 hours later.  Work on inhaler technique: Only use your albuterol as a rescue medication For cough > mucinex dm 1200 mg every 12 hours and the flutter valve as much as possible  Augmentin 875 mg take one pill twice daily  X 10 days -    06/28/2016  f/u ov/Edu On re: bronchiectasis s significant airflow obst by pfts  And sinusitis by ct  Chief Complaint  Patient presents with  . Follow-up    4 week follow up with PFT results. Still coughing.    worst time of cough at hs - overall at least 50% better, aware of purulent nasal discharge improves only while on augmentin and has not seen Dr Jenne Pane in years / not needing any saba on symb 80 2bid  > Jenne Pane 08/18/16 neg for infection > rec allergy eval   Allergy eval >  10/19/16  [place]  1. Chronic rhinitis - We cannot do testing today because you have take Claritin.  - Stop the fluticasone and start Dymista two sprays per  nostril daily. - Use nasal saline rinses prior to using the Dymista.  - Start the prednisone pack provided today: 10mg  twice daily for four days and then 10mg  once on the fifth day - Once we do testing, we can discuss further management strategies > neg histamine controls > neg RAST  2. Moderate persistent asthma, uncomplicated - Lung testing showed  - We will defer to Dr. Sherene Sires for management of your asthma and cough.  - Continue with the Symbicort two puffs twice daily. - Use a spacer to improve delivery of the Symbicort into your lungs.        03/06/2017  f/u ov/Jamie Cox re: bronchiectasis/ sinusitis/ chronic cough  Chief Complaint  Patient presents with  . Follow-up    Pt states had been coughing more- better since she took round of amoxicillin.  She denies any co's today. She uses her albuterol 2 x per wk on average.   Albuterol seems to help uses twice a week  No noct need for saba and really minimal noct or  early am cough  Temp changes triggers cough as well as exp to fumes but says cough is presently day > noct x "forever" even when she stays in same temp away from fumes No longer on GERD Rx Not limited by breathing from desired activities   rec Pantoprazole (protonix) 40 mg   Take  30-60 min before first meal of the day and zantac 300 mg one @  bedtime     05/05/2017  f/u ov/Jamie Cox re:  Brochiectasis/ AB/ on pred 2.5 for RA  Chief Complaint  Patient presents with  . Follow-up    Cough is progressively worse since she was last seen. She states she is coughing up beige to grey colored sputum. She went to go have MCT done and they would not perform the test due to excessive coughing.   cough has gradually worse since last ov and now  Day = noct /has become more productive Has flutter not using  pred boost has helped in past  Not limited by breathing from desired activities  But not very active   No obvious day to day or daytime variability or assoc  mucus plugs or hemoptysis or cp or chest tightness, subjective wheeze or overt sinus or hb symptoms. No unusual exposure hx or h/o childhood pna/ asthma or knowledge of premature birth.   . Also denies any obvious fluctuation of symptoms with weather or environmental changes or other aggravating or alleviating factors except as outlined above   Current Allergies, Complete Past Medical History, Past Surgical History, Family History, and Social History were reviewed in [employer] record.  ROS  The following are not active complaints unless bolded Hoarseness, sore throat, dysphagia, dental problems, itching, sneezing,  nasal congestion or discharge of excess mucus or purulent secretions, ear ache,   fever, chills, sweats, unintended wt loss or wt gain, classically pleuritic or exertional cp,  orthopnea pnd or leg swelling, presyncope, palpitations, abdominal pain, anorexia, nausea, vomiting, diarrhea  or change in bowel habits or  change in bladder habits, change in stools or change in urine, dysuria, hematuria,  rash, arthralgias, visual complaints, headache, numbness, weakness or ataxia or problems with walking or coordination,  change in mood/affect or memory.        Current Meds - not able to confirm   Medication Sig  . acetaminophen (TYLENOL) 650 MG CR tablet Take 650 mg by mouth every 8 (eight) hours as needed. For  pain  . albuterol (PROVENTIL HFA;VENTOLIN HFA) 108 (90 Base) MCG/ACT inhaler Inhale 1-2 puffs into the lungs every 6 (six) hours as needed for wheezing or shortness of breath (cough). For shortness of breath/wheezing  . benzonatate (TESSALON PERLES) 100 MG capsule Take 2 capsules (200 mg total) by mouth 3 (three) times daily as needed for cough.  . budesonide-formoterol (SYMBICORT) 80-4.5 MCG/ACT inhaler Inhale 2 puffs into the lungs 2 (two) times daily.  . calcium-vitamin D (CALCIUM 500/D) 500-200 MG-UNIT per tablet 1 tablet.  . fexofenadine (ALLEGRA) 180 MG tablet Take 180 mg by mouth daily.  . fluticasone (FLONASE) 50 MCG/ACT nasal spray USE TWO SPRAY(S) IN EACH NOSTRIL ONCE DAILY  . HEMOCYTE-F 324-1 MG TABS TAKE ONE TABLET BY MOUTH ONCE DAILY  . HYDROcodone-acetaminophen (NORCO/VICODIN) 5-325 MG tablet Take 1 tablet by mouth every 6 (six) hours as needed for moderate pain. July 2018  . hyoscyamine (LEVSIN, ANASPAZ) 0.125 MG tablet Take 1 tablet (0.125 mg total) by mouth every 4 (four) hours as needed.  . meloxicam (MOBIC) 7.5 MG tablet Take 1-2 tablets by mouth daily as needed for pain.  . meloxicam (MOBIC) 7.5 MG tablet TAKE ONE TO TWO TABLETS BY MOUTH ONCE DAILY AS NEEDED FOR PAIN  . methotrexate 2.5 MG tablet   . metoprolol succinate (TOPROL-XL) 25 MG 24 hr tablet TAKE 1 TABLET BY MOUTH EVERY DAY  . montelukast (SINGULAIR) 10 MG tablet TAKE 1 TABLET BY MOUTH AT BEDTIME  . Multiple Vitamin (MULTIVITAMIN) capsule Take 1 capsule by mouth daily.    . pantoprazole (PROTONIX) 40 MG tablet TAKE ONE TABLET  BY MOUTH ONCE DAILY  . predniSONE (DELTASONE) 2.5 MG tablet TAKE ONE TABLET BY MOUTH ONCE DAILY WITH  BREAKFAST  . ranitidine (ZANTAC) 300 MG tablet Take 1 tablet (300 mg total) at bedtime by mouth.  . triamcinolone ointment (KENALOG) 0.1 % Apply 1 application topically 2 (two) times daily.                   Objective:   Physical Exam  Chronically ill appearing amb bf/ nad at rest  05/05/2017    102  03/06/2017     101  06/28/2016       98    05/27/16 98 lb 12.8 oz (44.8 kg)  05/09/16 101 lb 3.2 oz (45.9 kg)  05/04/16 100 lb (45.4 kg)   Wt 105 12/23/2010    Vital signs reviewed - Note on arrival 02 sats  95% on RA        HEENT: nl dentition,   and oropharynx. Nl external ear canals without cough reflex - moderate bilateral non-specific turbinate edema     NECK :  without JVD/Nodes/TM/ nl carotid upstrokes bilaterally   LUNGS: no acc muscle use,  Nl contour chest with coarse bilateral insp and exp rhonchi and cough on exp maneuvers    CV:  RRR  no s3 or murmur or increase in P2, and no edema   ABD:  soft and nontender with nl inspiratory excursion in the supine position. No bruits or organomegaly appreciated, bowel sounds nl  MS:  Nl gait/ ext warm without deformities, calf tenderness, cyanosis or clubbing No obvious joint restrictions   SKIN: warm and dry without lesions    NEURO:  alert, approp, nl sensorium with  no motor or cerebellar deficits apparent.           Assessment & Plan:

## 2017-05-05 NOTE — Patient Instructions (Addendum)
Augmentin 875 mg take one pill twice daily  X 20 days - take at breakfast and supper with large glass of water.  It would help reduce the usual side effects (diarrhea and yeast infections) if you ate cultured yogurt at lunch.   Prednisone 10 mg take  4 each am x 2 days,   2 each am x 2 days,  1 each am x 2 days and then resume previous dose  For cough > mucinex dm 1200 mg every 12 hours and use the flutter as much as you can and supplement with vicodin  Work on inhaler technique:  relax and gently blow all the way out then take a nice smooth deep breath back in, triggering the inhaler at same time you start breathing in.  Hold for up to 5 seconds if you can. Blow out thru nose. Rinse and gargle with water when done      See Tammy NP 3 weeks with all your medications inhalers and flutter valve , even over the counter meds, separated in two separate bags, the ones you take no matter what vs the ones you stop once you feel better and take only as needed when you feel you need them.   Tammy  will generate for you a new user friendly medication calendar that will put [place] all on the same page re: your medication use.     Without this process, it simply isn't possible to assure that we are providing  your outpatient care  with  the attention to detail we feel you deserve.   If we cannot assure that you're getting that kind of care,  then we cannot manage your problem effectively from this clinic.  Once you have seen Tammy and we are sure that we're all on the same page with your medication use she will arrange follow up with me.

## 2017-05-07 ENCOUNTER — Encounter: Payer: Self-pay | Admitting: Internal Medicine

## 2017-05-07 NOTE — Assessment & Plan Note (Signed)
See HRCT 06/28/2016 assoc with pansinusitis > referred to [facility] > 08/18/16 no evidence infection > rec allergy eval > neg RAST  - PFT's  06/28/2016   Nl obstruction p am symbicort 80 x 2  - Spirometry 03/21/17   Minimal airflow obst off inhalers overnight   - Flutter valve use reviewed 05/05/2017  - see avs for instructions unique to this ov

## 2017-05-07 NOTE — Assessment & Plan Note (Addendum)
05/05/2017  After extensive coaching HFA effectiveness =    75% from a basline of < 10% effectiveness    DDX of  difficult airways management almost all start with A and  include Adherence, Ace Inhibitors, Acid Reflux, Active Sinus Disease, Alpha 1 Antitripsin deficiency, Anxiety masquerading as Airways dz,  ABPA,  Allergy(esp in young), Aspiration (esp in elderly), Adverse effects of meds,  Active smokers, A bunch of PE's (a small clot burden can't cause this syndrome unless there is already severe underlying pulm or vascular dz with poor reserve) plus two Bs  = Bronchiectasis and Beta blocker use..and one C= CHF   Adherence is always the initial "prime suspect" and is a multilayered concern that requires a "trust but verify" approach in every patient - starting with knowing how to use medications, especially inhalers, correctly, keeping up with refills and understanding the fundamental difference between maintenance and prns vs those medications only taken for a very short course and then stopped and not refilled.  - see hfa teaching - return with all meds in hand using a trust but verify approach to confirm accurate Medication  Reconciliation The principal here is that until we are certain that the  patients are doing what we've asked, it makes no sense to ask them to do more.   ? Acid (or non-acid) GERD > always difficult to exclude as up to 75% of pts in some series report no assoc GI/ Heartburn symptoms> rec continue max (24h)  acid suppression and diet restrictions/ reviewed     ? Active sinus dz > augmentin x 20 days then ent eval if not clear   ? Allergy > note neg w/u and pred at 2.5 mg per day should help but since acutely worse > Prednisone 10 mg take  4 each am x 2 days,   2 each am x 2 days,  1 each am x 2 days and then resume 2.5   Bronchiectasis (see separate a/p)    I had an extended discussion with the patient reviewing all relevant studies completed to date and  lasting 15 to 20  minutes of a 25 minute acute office  visit    Each maintenance medication was reviewed in detail including most importantly the difference between maintenance and prns and under what circumstances the prns are to be triggered using an action plan format that is not reflected in the computer generated alphabetically organized AVS.    Please see AVS for specific instructions unique to this visit that I personally wrote and verbalized to the the pt in detail and then reviewed with pt  by my nurse highlighting any  changes in therapy recommended at today's visit to their plan of care.

## 2017-05-08 ENCOUNTER — Encounter: Payer: Self-pay | Admitting: Family Medicine

## 2017-05-08 ENCOUNTER — Ambulatory Visit: Payer: BC Managed Care – PPO | Admitting: Family Medicine

## 2017-05-08 VITALS — BP 130/70 | HR 75 | Temp 98.3°F | Resp 18 | Wt 106.4 lb

## 2017-05-08 DIAGNOSIS — B351 Tinea unguium: Secondary | ICD-10-CM

## 2017-05-08 DIAGNOSIS — R05 Cough: Secondary | ICD-10-CM

## 2017-05-08 DIAGNOSIS — R7989 Other specified abnormal findings of blood chemistry: Secondary | ICD-10-CM | POA: Diagnosis not present

## 2017-05-08 DIAGNOSIS — M7918 Myalgia, other site: Secondary | ICD-10-CM

## 2017-05-08 DIAGNOSIS — D509 Iron deficiency anemia, unspecified: Secondary | ICD-10-CM

## 2017-05-08 DIAGNOSIS — R053 Chronic cough: Secondary | ICD-10-CM

## 2017-05-08 DIAGNOSIS — M069 Rheumatoid arthritis, unspecified: Secondary | ICD-10-CM

## 2017-05-08 DIAGNOSIS — K219 Gastro-esophageal reflux disease without esophagitis: Secondary | ICD-10-CM

## 2017-05-08 DIAGNOSIS — Z79899 Other long term (current) drug therapy: Secondary | ICD-10-CM | POA: Diagnosis not present

## 2017-05-08 DIAGNOSIS — D539 Nutritional anemia, unspecified: Secondary | ICD-10-CM | POA: Diagnosis not present

## 2017-05-08 DIAGNOSIS — M810 Age-related osteoporosis without current pathological fracture: Secondary | ICD-10-CM | POA: Diagnosis not present

## 2017-05-08 LAB — TSH: TSH: 0.4 u[IU]/mL (ref 0.35–4.50)

## 2017-05-08 LAB — VITAMIN D 25 HYDROXY (VIT D DEFICIENCY, FRACTURES): VITD: 35.19 ng/mL (ref 30.00–100.00)

## 2017-05-08 MED ORDER — HYDROCODONE-ACETAMINOPHEN 5-325 MG PO TABS: 1.0000 | ORAL_TABLET | Freq: Four times a day (QID) | ORAL | 0 refills | Status: DC | PRN

## 2017-05-08 MED ORDER — PANTOPRAZOLE SODIUM 40 MG PO TBEC: 40.0000 mg | DELAYED_RELEASE_TABLET | Freq: Every day | ORAL | 1 refills | Status: DC

## 2017-05-08 MED ORDER — FERROUS FUMARATE-FOLIC ACID 324-1 MG PO TABS: 1.0000 | ORAL_TABLET | Freq: Every day | ORAL | 5 refills | Status: DC

## 2017-05-08 MED FILL — HYDROCODON-APAP 5-325: 5-325 | 15 days supply | Qty: 60 | Fill #0

## 2017-05-08 NOTE — Assessment & Plan Note (Addendum)
Follows with Dr Azucena Fallen of rheumatology, is considering coming off of the prednisone is on the MTX now will discuss with rheumatology

## 2017-05-08 NOTE — Assessment & Plan Note (Signed)
Follows with Dr Azucena Fallen and she will discuss with her about quitting the Prednisone now that she is on MTX

## 2017-05-08 NOTE — Assessment & Plan Note (Signed)
Check PTH. °

## 2017-05-08 NOTE — Addendum Note (Signed)
Addended by: Danise Edge A on: 05/08/2017 09:41 AM   Modules accepted: Orders

## 2017-05-08 NOTE — Assessment & Plan Note (Signed)
Increase leafy greens, consider increased lean red meat and using cast iron cookware. Continue to monitor, report any concerns 

## 2017-05-08 NOTE — Assessment & Plan Note (Signed)
Check cmp and vitamin d today and PTH,

## 2017-05-08 NOTE — Assessment & Plan Note (Signed)
Consider Prolia, had to stop Boniva due to reflux and cough

## 2017-05-08 NOTE — Assessment & Plan Note (Signed)
Follows with pulmonology and awaiting further testing no changes

## 2017-05-08 NOTE — Progress Notes (Signed)
Subjective:  I acted as a Neurosurgeon for Dr. Abner Greenspan. Jamie Cox, [place]  Patient ID: Jamie Cox, female    DOB: 08-13-1960, 56 y.o.   MRN: [id number]  No chief complaint on file.   HPI  Patient is in today for a 3 month follow up and is stable. She has chronic cough day and night but continues to work full time. Is now on an antibiotic and increased Prednisone for now. They are trying to get her cough down so she can proceed with asthma testing. She continues to have shoulder pain, back pain, joint pain telling. Is having good pain relief with her Hydrocodone and no concerning side effects. Is following with rheumatology for her RA and thinks the MTX is helping some. Denies CP/palp/HA/fevers/GI or GU c/o. Taking meds as prescribed. Struggling with pain from her thick toenails  Patient Care Team: [name], MD as PCP - General (Family Medicine) Mardella Layman, MD as Consulting Physician (Gastroenterology)   Past Medical History:  Diagnosis Date  . Allergy   . Asthma   . Chronic sinusitis    Followed by Dr. Jenne Pane  . Environmental allergies   . GERD (gastroesophageal reflux disease)   . 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 History of chicken pox   . Hypercalcemia 07/19/2015  . Osteoporosis, unspecified    steroid induced  . Personal history of other diseases of circulatory system   . Rheumatoid arthritis(714.0)   . Unspecified deficiency anemia    microcytic  . Urticaria     Past Surgical History:  Procedure Laterality Date  . NASAL SINUS SURGERY  06/2012    Family History  Problem Relation Age of Onset  . Liver cancer Maternal Grandmother   . Hypertension Maternal Grandmother   . Heart attack Maternal Grandmother   . Scleroderma Mother   . Heart disease Mother   . Kidney disease Mother   . Prostate cancer Father   . Diabetes Father   . Heart disease Father        CHF  . Arthritis Father        s/p hip replacement  . Cancer Father        lung cancer  with mets, smoker  . Lung cancer Paternal Uncle   . Coronary artery disease Paternal Grandmother   . Hypertension Paternal Grandmother   . Liver cancer Paternal Grandmother   . Hyperlipidemia Son   . Alzheimer's disease Paternal Grandfather   . Other Sister        Cardiomegaly  . Arthritis Sister   . Heart disease Sister        rare  . Sleep apnea Brother   . Arthritis Daughter   . Lupus Cousin   . Arthritis Cousin   . Colon cancer Neg Hx     Social History   Socioeconomic History  . Marital status: Married    Spouse name: Not on file  . Number of children: 2  . Years of education: Not on file  . Highest education level: Not on file  Social Needs  . Financial resource strain: Not on file  . Food insecurity - worry: Not on file  . Food insecurity - inability: Not on file  . Transportation needs - medical: Not on file  . Transportation needs - non-medical: Not on file  Occupational History  . Occupation: Data processing manager  Tobacco Use  . Smoking status: Never Smoker  . Smokeless tobacco: Never Used  Substance and Sexual Activity  .  Alcohol use: No    Alcohol/week: 0.0 oz  . Drug use: No  . Sexual activity: Not Currently  Other Topics Concern  . Not on file  Social History Narrative  . Not on file    Outpatient Medications Prior to Visit  Medication Sig Dispense Refill  . acetaminophen (TYLENOL) 650 MG CR tablet Take 650 mg by mouth every 8 (eight) hours as needed. For pain    . albuterol (PROVENTIL HFA;VENTOLIN HFA) 108 (90 Base) MCG/ACT inhaler Inhale 1-2 puffs into the lungs every 6 (six) hours as needed for wheezing or shortness of breath (cough). For shortness of breath/wheezing 3.7 g 1  . amoxicillin-clavulanate (AUGMENTIN) 875-125 MG tablet Augmentin 875 mg take one pill twice daily  X 20 days 40 tablet 0  . benzonatate (TESSALON PERLES) 100 MG capsule Take 2 capsules (200 mg total) by mouth 3 (three) times daily as needed for cough. 60 capsule 1  .  budesonide-formoterol (SYMBICORT) 80-4.5 MCG/ACT inhaler Inhale 2 puffs into the lungs 2 (two) times daily. 1 Inhaler 3  . calcium-vitamin D (CALCIUM 500/D) 500-200 MG-UNIT per tablet 1 tablet.    . fexofenadine (ALLEGRA) 180 MG tablet Take 180 mg by mouth daily.    . fluticasone (FLONASE) 50 MCG/ACT nasal spray USE TWO SPRAY(S) IN EACH NOSTRIL ONCE DAILY 16 g 3  . hyoscyamine (LEVSIN, ANASPAZ) 0.125 MG tablet Take 1 tablet (0.125 mg total) by mouth every 4 (four) hours as needed. 30 tablet 0  . meloxicam (MOBIC) 7.5 MG tablet Take 1-2 tablets by mouth daily as needed for pain. 60 tablet 3  . meloxicam (MOBIC) 7.5 MG tablet TAKE ONE TO TWO TABLETS BY MOUTH ONCE DAILY AS NEEDED FOR PAIN 60 tablet 3  . methotrexate 2.5 MG tablet     . metoprolol succinate (TOPROL-XL) 25 MG 24 hr tablet TAKE 1 TABLET BY MOUTH EVERY DAY 30 tablet 0  . montelukast (SINGULAIR) 10 MG tablet TAKE 1 TABLET BY MOUTH AT BEDTIME 30 tablet 5  . Multiple Vitamin (MULTIVITAMIN) capsule Take 1 capsule by mouth daily.      . predniSONE (DELTASONE) 10 MG tablet Take  4 each am x 2 days,   2 each am x 2 days,  1 each am x 2 days and then resume the 2.5mg  dose 14 tablet 0  . predniSONE (DELTASONE) 2.5 MG tablet TAKE ONE TABLET BY MOUTH ONCE DAILY WITH  BREAKFAST 90 tablet 1  . ranitidine (ZANTAC) 300 MG tablet Take 1 tablet (300 mg total) at bedtime by mouth. 90 tablet 1  . triamcinolone ointment (KENALOG) 0.1 % Apply 1 application topically 2 (two) times daily. 30 g 5  . HEMOCYTE-F 324-1 MG TABS TAKE ONE TABLET BY MOUTH ONCE DAILY 30 each 5  . HYDROcodone-acetaminophen (NORCO/VICODIN) 5-325 MG tablet Take 1 tablet by mouth every 6 (six) hours as needed for moderate pain. July 2018 40 tablet 0  . pantoprazole (PROTONIX) 40 MG tablet TAKE ONE TABLET BY MOUTH ONCE DAILY 90 tablet 1   No facility-administered medications prior to visit.     Allergies  Allergen Reactions  . Avelox [Moxifloxacin Hcl In Nacl] Diarrhea, Nausea And  Vomiting and Other (See Comments)    Dizziness, Cold chills   . Bactrim [Sulfamethoxazole-Trimethoprim] Hives  . Sulfamethoxazole-Trimethoprim Hives    Review of Systems  Constitutional: Positive for malaise/fatigue. Negative for fever.  HENT: Positive for congestion.   Eyes: Negative for blurred vision.  Respiratory: Positive for cough and sputum production. Negative for shortness  of breath.   Cardiovascular: Positive for chest pain. Negative for palpitations and leg swelling.  Gastrointestinal: Negative for abdominal pain, blood in stool and nausea.  Genitourinary: Negative for dysuria and frequency.  Musculoskeletal: Positive for joint pain. Negative for falls.  Skin: Negative for rash.  Neurological: Negative for dizziness, loss of consciousness and headaches.  Endo/Heme/Allergies: Negative for environmental allergies.  Psychiatric/Behavioral: Negative for depression. The patient is not nervous/anxious.        Objective:    Physical Exam  Constitutional: She is oriented to person, place, and time. She appears well-developed and well-nourished. No distress.  HENT:  Head: Normocephalic and atraumatic.  Nose: Nose normal.  Oropharynx is erythematous  Eyes: Right eye exhibits no discharge. Left eye exhibits no discharge.  Neck: Normal range of motion. Neck supple.  Cardiovascular: Normal rate and regular rhythm.  No murmur heard. Pulmonary/Chest: Effort normal and breath sounds normal.  Abdominal: Soft. Bowel sounds are normal. There is no tenderness.  Musculoskeletal: She exhibits no edema.  Neurological: She is alert and oriented to person, place, and time.  Skin: Skin is warm and dry.  Psychiatric: She has a normal mood and affect.  Nursing note and vitals reviewed.   BP 130/70 (BP Location: Left Arm, Patient Position: Sitting, Cuff Size: Normal)   Pulse 75   Temp 98.3 F (36.8 C) (Oral)   Resp 18   Wt 106 lb 6.4 oz (48.3 kg)   SpO2 98%   BMI 20.10 kg/m  Wt  Readings from Last 3 Encounters:  05/08/17 106 lb 6.4 oz (48.3 kg)  05/05/17 102 lb (46.3 kg)  03/06/17 101 lb 12.8 oz (46.2 kg)   BP Readings from Last 3 Encounters:  05/08/17 130/70  05/05/17 116/70  03/06/17 98/60     Immunization History  Administered Date(s) Administered  . Influenza Split 04/05/2011  . Influenza Whole 04/10/2008, 05/15/2009, 05/21/2010, 02/05/2012  . Influenza,inj,Quad PF,6+ Mos 08/06/2013, 05/09/2016  . Pneumococcal Polysaccharide-23 07/01/2010  . Td 05/15/2009    Health Maintenance  Topic Date Due  . INFLUENZA VACCINE  09/03/2017 (Originally 01/04/2017)  . MAMMOGRAM  08/06/2017  . PAP SMEAR  03/05/2019  . TETANUS/TDAP  05/16/2019  . COLONOSCOPY  10/16/2022  . Hepatitis C Screening  Completed  . HIV Screening  Completed    Lab Results  Component Value Date   WBC 4.6 02/13/2017   HGB 10.3 (L) 02/13/2017   HCT 34.0 (L) 02/13/2017   PLT 214.0 02/13/2017   GLUCOSE 77 11/21/2016   CHOL 190 07/13/2015   TRIG 111.0 07/13/2015   HDL 73.90 07/13/2015   LDLCALC 94 07/13/2015   ALT 11 11/21/2016   AST 18 11/21/2016   NA 138 11/21/2016   K 4.1 11/21/2016   CL 104 11/21/2016   CREATININE 0.68 11/21/2016   BUN 10 11/21/2016   CO2 28 11/21/2016   TSH 1.47 06/09/2016    Lab Results  Component Value Date   TSH 1.47 06/09/2016   Lab Results  Component Value Date   WBC 4.6 02/13/2017   HGB 10.3 (L) 02/13/2017   HCT 34.0 (L) 02/13/2017   MCV 76.6 (L) 02/13/2017   PLT 214.0 02/13/2017   Lab Results  Component Value Date   NA 138 11/21/2016   K 4.1 11/21/2016   CO2 28 11/21/2016   GLUCOSE 77 11/21/2016   BUN 10 11/21/2016   CREATININE 0.68 11/21/2016   BILITOT 0.2 11/21/2016   ALKPHOS 60 11/21/2016   AST 18 11/21/2016   ALT 11  11/21/2016   PROT 7.5 11/21/2016   ALBUMIN 3.9 11/21/2016   CALCIUM 9.7 11/21/2016   GFR 114.21 11/21/2016   Lab Results  Component Value Date   CHOL 190 07/13/2015   Lab Results  Component Value Date   HDL  73.90 07/13/2015   Lab Results  Component Value Date   LDLCALC 94 07/13/2015   Lab Results  Component Value Date   TRIG 111.0 07/13/2015   Lab Results  Component Value Date   CHOLHDL 3 07/13/2015   No results found for: HGBA1C       Assessment & Plan:   Problem List Items Addressed This Visit    Deficiency anemia    Increase leafy greens, consider increased lean red meat and using cast iron cookware. Continue to monitor, report any concerns      Relevant Medications   Ferrous Fumarate-Folic Acid (HEMOCYTE-F) 324-1 MG TABS   Rheumatoid arthritis (HCC)    Follows with Dr Azucena FallenMichelle Young and she will discuss with her about quitting the Prednisone now that she is on MTX      Relevant Medications   HYDROcodone-acetaminophen (NORCO/VICODIN) 5-325 MG tablet   Osteoporosis    Consider Prolia, had to stop Boniva due to reflux and cough      Relevant Orders   Comprehensive metabolic panel   VITAMIN D 25 Hydroxy (Vit-D Deficiency, Fractures)   TSH   Chronic cough    Follows with pulmonology and awaiting further testing no changes      GERD (gastroesophageal reflux disease)    Avoid offending foods, start probiotics. Do not eat large meals in late evening and consider raising head of bed. Asymptomatic on Ranitdiine      Relevant Medications   pantoprazole (PROTONIX) 40 MG tablet   Other Relevant Orders   TSH   Intercostal muscle pain    Shoulder, arm and chest pain with working uses hydrocodone with good relief, UDS and contract updated, refill given      Hypercalcemia    Check cmp and vitamin d today and PTH,       Relevant Orders   Comprehensive metabolic panel   VITAMIN D 25 Hydroxy (Vit-D Deficiency, Fractures)   PTH, Intact and Calcium   High serum parathyroid hormone (PTH)    Check PTH      Onychomycosis   Relevant Orders   Ambulatory referral to Podiatry    Other Visit Diagnoses    High risk medication use    -  Primary   Relevant Orders   Pain  Mgmt, Profile 8 w/Conf, U   Iron deficiency anemia, unspecified iron deficiency anemia type       Relevant Medications   Ferrous Fumarate-Folic Acid (HEMOCYTE-F) 324-1 MG TABS   Other Relevant Orders   CBC with Differential/Platelet      I have changed Madolin Tramble's HEMOCYTE-F to Ferrous Fumarate-Folic Acid. I have also changed her pantoprazole. I am also having her maintain her acetaminophen, multivitamin, calcium-vitamin D, hyoscyamine, albuterol, budesonide-formoterol, meloxicam, methotrexate, triamcinolone ointment, metoprolol succinate, meloxicam, predniSONE, benzonatate, ranitidine, montelukast, fluticasone, fexofenadine, amoxicillin-clavulanate, predniSONE, and HYDROcodone-acetaminophen.  Meds ordered this encounter  Medications  . Ferrous Fumarate-Folic Acid (HEMOCYTE-F) 324-1 MG TABS    Sig: Take 1 tablet by mouth daily.    Dispense:  30 each    Refill:  5    Please consider 90 day supplies to promote better adherence  . pantoprazole (PROTONIX) 40 MG tablet    Sig: Take 1 tablet (40 mg total) by mouth  daily.    Dispense:  90 tablet    Refill:  1  . HYDROcodone-acetaminophen (NORCO/VICODIN) 5-325 MG tablet    Sig: Take 1 tablet by mouth every 6 (six) hours as needed for moderate pain. July 2018    Dispense:  60 tablet    Refill:  0    CMA served as Neurosurgeon during this visit. History, Physical and Plan performed by medical provider. Documentation and orders reviewed and attested to.  Danise Edge, MD

## 2017-05-08 NOTE — Assessment & Plan Note (Signed)
Avoid offending foods, start probiotics. Do not eat large meals in late evening and consider raising head of bed. Asymptomatic on Ranitdiine

## 2017-05-08 NOTE — Assessment & Plan Note (Signed)
Shoulder, arm and chest pain with working uses hydrocodone with good relief, UDS and contract updated, refill given

## 2017-05-08 NOTE — Patient Instructions (Addendum)
Try Elderberry liquid  Talk to Dr Maple Hudson about the Prednisone Cough, Adult A cough helps to clear your throat and lungs. A cough may last only 2-3 weeks (acute), or it may last longer than 8 weeks (chronic). Many different things can cause a cough. A cough may be a sign of an illness or another medical condition. Follow these instructions at home:  Pay attention to any changes in your cough.  Take medicines only as told by your doctor. ? If you were prescribed an antibiotic medicine, take it as told by your doctor. Do not stop taking it even if you start to feel better. ? Talk with your doctor before you try using a cough medicine.  Drink enough fluid to keep your pee (urine) clear or pale yellow.  If the air is dry, use a cold steam vaporizer or humidifier in your home.  Stay away from things that make you cough at work or at home.  If your cough is worse at night, try using extra pillows to raise your head up higher while you sleep.  Do not smoke, and try not to be around smoke. If you need help quitting, ask your doctor.  Do not have caffeine.  Do not drink alcohol.  Rest as needed. Contact a doctor if:  You have new problems (symptoms).  You cough up yellow fluid (pus).  Your cough does not get better after 2-3 weeks, or your cough gets worse.  Medicine does not help your cough and you are not sleeping well.  You have pain that gets worse or pain that is not helped with medicine.  You have a fever.  You are losing weight and you do not know why.  You have night sweats. Get help right away if:  You cough up blood.  You have trouble breathing.  Your heartbeat is very fast. This information is not intended to replace advice given to you by your health care provider. Make sure you discuss any questions you have with your health care provider. Document Released: 02/03/2011 Document Revised: 10/29/2015 Document Reviewed: 07/30/2014 Elsevier Interactive Patient  Education  [employer].

## 2017-05-09 LAB — COMPREHENSIVE METABOLIC PANEL
ALT: 19 U/L (ref 0–35)
AST: 25 U/L (ref 0–37)
Albumin: 4.2 g/dL (ref 3.5–5.2)
Alkaline Phosphatase: 55 U/L (ref 39–117)
BUN: 13 mg/dL (ref 6–23)
CALCIUM: 10.3 mg/dL (ref 8.4–10.5)
CHLORIDE: 107 meq/L (ref 96–112)
CO2: 26 meq/L (ref 19–32)
Creatinine, Ser: 0.67 mg/dL (ref 0.40–1.20)
GFR: 116 mL/min (ref >60.00)
Glucose, Bld: 86 mg/dL (ref 70–99)
Potassium: 4.9 mEq/L (ref 3.5–5.1)
Sodium: 140 mEq/L (ref 135–145)
Total Bilirubin: 0.2 mg/dL (ref 0.2–1.2)
Total Protein: 8 g/dL (ref 6.0–8.3)

## 2017-05-09 LAB — CBC WITH DIFFERENTIAL/PLATELET
Basophils Absolute: 0 10*3/uL (ref 0.0–0.1)
Basophils Relative: 0.8 % (ref 0.0–3.0)
EOS ABS: 0 10*3/uL (ref 0.0–0.7)
Eosinophils Relative: 0.2 % (ref 0.0–5.0)
HEMATOCRIT: 35.8 % — AB (ref 36.0–46.0)
Hemoglobin: 11.2 g/dL — ABNORMAL LOW (ref 12.0–15.0)
LYMPHS ABS: 1.2 10*3/uL (ref 0.7–4.0)
Lymphocytes Relative: 23.6 % (ref 12.0–46.0)
MCHC: 31.3 g/dL (ref 30.0–36.0)
MCV: 76.9 fl — ABNORMAL LOW (ref 78.0–100.0)
MONO ABS: 0.3 10*3/uL (ref 0.1–1.0)
Monocytes Relative: 6.3 % (ref 3.0–12.0)
Neutro Abs: 3.6 10*3/uL (ref 1.4–7.7)
PLATELETS: 260 10*3/uL (ref 150.0–400.0)
RBC: 4.66 Mil/uL (ref 3.87–5.11)
RDW: 17.8 % — AB (ref 11.5–15.5)
WBC: 5.2 10*3/uL (ref 4.0–10.5)

## 2017-05-09 LAB — PAIN MGMT, PROFILE 8 W/CONF, U
6 Acetylmorphine: NEGATIVE ng/mL (ref <10)
ALCOHOL METABOLITES: NEGATIVE ng/mL (ref <500)
Amphetamines: NEGATIVE ng/mL (ref <500)
BENZODIAZEPINES: NEGATIVE ng/mL (ref <100)
Buprenorphine, Urine: NEGATIVE ng/mL (ref <5)
COCAINE METABOLITE: NEGATIVE ng/mL (ref <150)
CREATININE: 82.7 mg/dL
MDMA: NEGATIVE ng/mL (ref <500)
Marijuana Metabolite: NEGATIVE ng/mL (ref <20)
Opiates: NEGATIVE ng/mL (ref <100)
Oxidant: NEGATIVE ug/mL (ref <200)
Oxycodone: NEGATIVE ng/mL (ref <100)
PH: 6.88 (ref 4.5–9.0)

## 2017-05-09 LAB — PTH, INTACT AND CALCIUM
CALCIUM: 10.3 mg/dL (ref 8.6–10.4)
PTH: 48 pg/mL (ref 14–64)

## 2017-05-26 ENCOUNTER — Ambulatory Visit (INDEPENDENT_AMBULATORY_CARE_PROVIDER_SITE_OTHER)
Admission: RE | Admit: 2017-05-26 | Discharge: 2017-05-26 | Disposition: A | Discharge status: Home / Self Care | Payer: BC Managed Care – PPO | Source: Ambulatory Visit | Attending: Adult Health | Admitting: Adult Health

## 2017-05-26 ENCOUNTER — Encounter: Payer: Self-pay | Admitting: Adult Health

## 2017-05-26 ENCOUNTER — Ambulatory Visit: Payer: BC Managed Care – PPO | Admitting: Adult Health

## 2017-05-26 ENCOUNTER — Other Ambulatory Visit: Payer: BC Managed Care – PPO

## 2017-05-26 VITALS — BP 104/62 | HR 93 | Ht 61.0 in | Wt 102.0 lb

## 2017-05-26 DIAGNOSIS — J479 Bronchiectasis, uncomplicated: Secondary | ICD-10-CM | POA: Diagnosis not present

## 2017-05-26 DIAGNOSIS — R0989 Other specified symptoms and signs involving the circulatory and respiratory systems: Secondary | ICD-10-CM | POA: Diagnosis not present

## 2017-05-26 DIAGNOSIS — R053 Chronic cough: Secondary | ICD-10-CM

## 2017-05-26 DIAGNOSIS — J328 Other chronic sinusitis: Secondary | ICD-10-CM

## 2017-05-26 DIAGNOSIS — R05 Cough: Secondary | ICD-10-CM

## 2017-05-26 DIAGNOSIS — R059 Cough, unspecified: Secondary | ICD-10-CM

## 2017-05-26 DIAGNOSIS — M0510 Rheumatoid lung disease with rheumatoid arthritis of unspecified site: Secondary | ICD-10-CM

## 2017-05-26 NOTE — Patient Instructions (Addendum)
Refer to ENT , Dr. Jenne Pane.  Chest xray today .  Add mucinex Twice daily  As needed  Cough/congestion  Use flutter valve several times a day .  Sputum culture today .  Delsym 2 tsp Twice daily  As needed  Cough .  follow up Dr. Sherene Sires  In 6 weeks and  As needed   Please contact office for sooner follow up if symptoms do not improve or worsen or seek emergency care

## 2017-05-26 NOTE — Progress Notes (Signed)
Chart and office note reviewed in detail  > agree with a/p as outlined    

## 2017-05-26 NOTE — Assessment & Plan Note (Signed)
Slow to resolve flare  Hold on further abx at this time  Check Sputum cx and Sputum AFB  Check cxr today .   Plan  Patient Instructions  Refer to ENT , Dr. Jenne Pane.  Chest xray today .  Add mucinex Twice daily  As needed  Cough/congestion  Use flutter valve several times a day .  Sputum culture today .  Delsym 2 tsp Twice daily  As needed  Cough .  follow up Dr. Sherene Sires  In 6 weeks and  As needed   Please contact office for sooner follow up if symptoms do not improve or worsen or seek emergency care

## 2017-05-26 NOTE — Progress Notes (Signed)
 @Patient  ID: Jamie Cox, female    DOB: 09/12/1960, 56 y.o.   MRN: [id number]  Chief Complaint  Patient presents with  . Follow-up    cough     Referring provider: Bradd CanaryBlyth, Stacey A, MD  HPI: 56 year old female never smoker with rheumatoid arthritis steroid dependent since 2000.  She remains on methotrexate.  She has followed for RA -ILD\bronchiectasis .    05/26/2017 Follow up;: Bronchiectasis /AB/ILD  Patient returns for a one-month follow-up.  Patient was seen last visit for asthmatic bronchitic exacerbation along with probable sinusitis.  She was given Augmentin times 20 days along with a prednisone burst.  Patient says she is only had minimal improvement of symptoms she continues to have ongoing cough with thick mucus.  Has sinus congestion and drainage.  She is followed by ENT but is not seeing them in some time.  Previous CT sinus earlier this year showed sinusitis. Patient is using flutter several times a day.  Patient brought all her medicines and today.  The appeared to be correct with our MAR.  Patient education given.  Allergies  Allergen Reactions  . Avelox [Moxifloxacin Hcl In Nacl] Diarrhea, Nausea And Vomiting and Other (See Comments)    Dizziness, Cold chills   . Bactrim [Sulfamethoxazole-Trimethoprim] Hives  . Sulfamethoxazole-Trimethoprim Hives    Immunization History  Administered Date(s) Administered  . Influenza Split 04/05/2011  . Influenza Whole 04/10/2008, 05/15/2009, 05/21/2010, 02/05/2012  . Influenza,inj,Quad PF,6+ Mos 08/06/2013, 05/09/2016  . Pneumococcal Polysaccharide-23 07/01/2010  . Td 05/15/2009    Past Medical History:  Diagnosis Date  . Allergy   . Asthma   . Chronic sinusitis    Followed by Dr. Jenne PaneBates  . Environmental allergies   . GERD (gastroesophageal reflux disease)   . 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 History of chicken pox   . Hypercalcemia 07/19/2015  . Osteoporosis, unspecified    steroid induced  .  Personal history of other diseases of circulatory system   . Rheumatoid arthritis(714.0)   . Unspecified deficiency anemia    microcytic  . Urticaria     Tobacco History: Social History   Tobacco Use  Smoking Status Never Smoker  Smokeless Tobacco Never Used   Counseling given: Not Answered   Outpatient Encounter Medications as of 05/26/2017  Medication Sig  . acetaminophen (TYLENOL) 650 MG CR tablet Take 650 mg by mouth every 8 (eight) hours as needed. For pain  . albuterol (PROVENTIL HFA;VENTOLIN HFA) 108 (90 Base) MCG/ACT inhaler Inhale 1-2 puffs into the lungs every 6 (six) hours as needed for wheezing or shortness of breath (cough). For shortness of breath/wheezing  . amoxicillin-clavulanate (AUGMENTIN) 875-125 MG tablet Augmentin 875 mg take one pill twice daily  X 20 days  . benzonatate (TESSALON PERLES) 100 MG capsule Take 2 capsules (200 mg total) by mouth 3 (three) times daily as needed for cough.  . budesonide-formoterol (SYMBICORT) 80-4.5 MCG/ACT inhaler Inhale 2 puffs into the lungs 2 (two) times daily.  . calcium-vitamin D (CALCIUM 500/D) 500-200 MG-UNIT per tablet 1 tablet.  . Ferrous Fumarate-Folic Acid (HEMOCYTE-F) 324-1 MG TABS Take 1 tablet by mouth daily.  . fexofenadine (ALLEGRA) 180 MG tablet Take 180 mg by mouth daily.  . fluticasone (FLONASE) 50 MCG/ACT nasal spray USE TWO SPRAY(S) IN EACH NOSTRIL ONCE DAILY  . HYDROcodone-acetaminophen (NORCO/VICODIN) 5-325 MG tablet Take 1 tablet by mouth every 6 (six) hours as needed for moderate pain. July 2018  . hyoscyamine (LEVSIN, ANASPAZ) 0.125 MG tablet Take  1 tablet (0.125 mg total) by mouth every 4 (four) hours as needed.  . meloxicam (MOBIC) 7.5 MG tablet Take 1-2 tablets by mouth daily as needed for pain.  . meloxicam (MOBIC) 7.5 MG tablet TAKE ONE TO TWO TABLETS BY MOUTH ONCE DAILY AS NEEDED FOR PAIN  . methotrexate 2.5 MG tablet   . metoprolol succinate (TOPROL-XL) 25 MG 24 hr tablet TAKE 1 TABLET BY MOUTH  EVERY DAY  . montelukast (SINGULAIR) 10 MG tablet TAKE 1 TABLET BY MOUTH AT BEDTIME  . Multiple Vitamin (MULTIVITAMIN) capsule Take 1 capsule by mouth daily.    . pantoprazole (PROTONIX) 40 MG tablet Take 1 tablet (40 mg total) by mouth daily.  . predniSONE (DELTASONE) 10 MG tablet Take  4 each am x 2 days,   2 each am x 2 days,  1 each am x 2 days and then resume the 2.5mg  dose  . predniSONE (DELTASONE) 2.5 MG tablet TAKE ONE TABLET BY MOUTH ONCE DAILY WITH  BREAKFAST  . ranitidine (ZANTAC) 300 MG tablet Take 1 tablet (300 mg total) at bedtime by mouth.  . triamcinolone ointment (KENALOG) 0.1 % Apply 1 application topically 2 (two) times daily.   No facility-administered encounter medications on file as of 05/26/2017.      Review of Systems  Constitutional:   No  weight loss, night sweats,  Fevers, chills, fatigue, or  lassitude.  HEENT:   No headaches,  Difficulty swallowing,  Tooth/dental problems, or  Sore throat,                No sneezing, itching, ear ache,  +nasal congestion, post nasal drip,   CV:  No chest pain,  Orthopnea, PND, swelling in lower extremities, anasarca, dizziness, palpitations, syncope.   GI  No heartburn, indigestion, abdominal pain, nausea, vomiting, diarrhea, change in bowel habits, loss of appetite, bloody stools.   Resp:   No chest wall deformity  Skin: no rash or lesions.  GU: no dysuria, change in color of urine, no urgency or frequency.  No flank pain, no hematuria   MS:  No joint pain or swelling.  No decreased range of motion.  No back pain.    Physical Exam  BP 104/62 (BP Location: Right Arm, Cuff Size: Normal)   Pulse 93   Ht 5\' 1"  (1.549 m)   Wt 102 lb (46.3 kg)   SpO2 97%   BMI 19.27 kg/m   GEN: A/Ox3; pleasant , NAD,    HEENT:  [place]/AT,  EACs-clear, TMs-wnl, NOSE-clear drainage.  Dilating tHROAT-clear, no lesions, no postnasal drip or exudate noted.   NECK:  Supple w/ fair ROM; no JVD; normal carotid impulses w/o bruits; no  thyromegaly or nodules palpated; no lymphadenopathy.    RESP few scattered rhonchi known  no dullness to percussion  CARD:  RRR, no m/r/g, no peripheral edema, pulses intact, no cyanosis or clubbing.  GI:   Soft & nt; nml bowel sounds; no organomegaly or masses detected.   Musco: Warm bil, no deformities or joint swelling noted.   Neuro: alert, no focal deficits noted.    Skin: Warm, no lesions or rashes    Lab Results:   BNP No results found for: BNP  ProBNP No results found for: PROBNP  Imaging: No results found.   Assessment & Plan:   Bronchiectasis without acute exacerbation (HCC) Slow to resolve flare  Hold on further abx at this time  Check Sputum cx and Sputum AFB  Check cxr today .  Plan  Patient Instructions  Refer to ENT , Dr. Jenne Pane.  Chest xray today .  Add mucinex Twice daily  As needed  Cough/congestion  Use flutter valve several times a day .  Sputum culture today .  Delsym 2 tsp Twice daily  As needed  Cough .  follow up Dr. Sherene Sires  In 6 weeks and  As needed   Please contact office for sooner follow up if symptoms do not improve or worsen or seek emergency care        Chronic sinusitis Ongoing sx despite 3 weeks of abx.  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

## 2017-05-26 NOTE — Assessment & Plan Note (Signed)
Cont on current regimen  Check cxr today  

## 2017-05-26 NOTE — Assessment & Plan Note (Signed)
Ongoing sx despite 3 weeks of abx.  Refer to ENT  Saline nasal rinses  mucinex

## 2017-06-11 ENCOUNTER — Encounter (HOSPITAL_COMMUNITY): Payer: Self-pay | Admitting: Emergency Medicine

## 2017-06-11 ENCOUNTER — Ambulatory Visit (HOSPITAL_COMMUNITY)
Admission: EM | Admit: 2017-06-11 | Discharge: 2017-06-11 | Disposition: A | Discharge status: Home / Self Care | Payer: BC Managed Care – PPO | Attending: Urgent Care | Admitting: Urgent Care

## 2017-06-11 DIAGNOSIS — R0789 Other chest pain: Secondary | ICD-10-CM | POA: Diagnosis not present

## 2017-06-11 DIAGNOSIS — R05 Cough: Secondary | ICD-10-CM

## 2017-06-11 DIAGNOSIS — J45909 Unspecified asthma, uncomplicated: Secondary | ICD-10-CM

## 2017-06-11 DIAGNOSIS — R053 Chronic cough: Secondary | ICD-10-CM

## 2017-06-11 NOTE — ED Triage Notes (Signed)
PT C/O: intermittent CP onset 3 months  Reports hx of chronic cough   DENIES: blurred vision, diaphoresis, n/v, dyspnea/SOB  TAKING MEDS: acetaminophen   A&O x4... NAD... Ambulatory

## 2017-06-11 NOTE — ED Provider Notes (Signed)
MRN: [id number] DOB: 09-04-1960  Subjective:   Jamie Cox is a 57 y.o. female presenting for 3 month history of intermittent chest pain, worse in the past day. Reports that the pain is sharp, comes in waves. Also has had 6 month history of cough, is currently being managed for this with her pulmonologist. She was given course of Augmentin for 20 days along with steroids, had a chest x-ray done on 05/26/2017, that reported "Mild lingular infiltrate, pneumonia cannot be excluded." Per Dr. Sherene Sires patient was to hold off on additional antibiotics until she completed a sputum culture. Has tried APAP, hydrocodone as needed, meloxicam. Takes Protonix and Zantac for GERD. Takes prednisone daily.  No current facility-administered medications for this encounter.   Current Outpatient Medications:  .  acetaminophen (TYLENOL) 650 MG CR tablet, Take 650 mg by mouth every 8 (eight) hours as needed. For pain, Disp: , Rfl:  .  calcium-vitamin D (CALCIUM 500/D) 500-200 MG-UNIT per tablet, 1 tablet., Disp: , Rfl:  .  Ferrous Fumarate-Folic Acid (HEMOCYTE-F) 324-1 MG TABS, Take 1 tablet by mouth daily., Disp: 30 each, Rfl: 5 .  fexofenadine (ALLEGRA) 180 MG tablet, Take 180 mg by mouth daily., Disp: , Rfl:  .  fluticasone (FLONASE) 50 MCG/ACT nasal spray, USE TWO SPRAY(S) IN EACH NOSTRIL ONCE DAILY, Disp: 16 g, Rfl: 3 .  hyoscyamine (LEVSIN, ANASPAZ) 0.125 MG tablet, Take 1 tablet (0.125 mg total) by mouth every 4 (four) hours as needed., Disp: 30 tablet, Rfl: 0 .  meloxicam (MOBIC) 7.5 MG tablet, Take 1-2 tablets by mouth daily as needed for pain., Disp: 60 tablet, Rfl: 3 .  meloxicam (MOBIC) 7.5 MG tablet, TAKE ONE TO TWO TABLETS BY MOUTH ONCE DAILY AS NEEDED FOR PAIN, Disp: 60 tablet, Rfl: 3 .  methotrexate 2.5 MG tablet, , Disp: , Rfl:  .  metoprolol succinate (TOPROL-XL) 25 MG 24 hr tablet, TAKE 1 TABLET BY MOUTH EVERY DAY, Disp: 30 tablet, Rfl: 0 .  montelukast (SINGULAIR) 10 MG tablet, TAKE 1 TABLET BY MOUTH  AT BEDTIME, Disp: 30 tablet, Rfl: 5 .  Multiple Vitamin (MULTIVITAMIN) capsule, Take 1 capsule by mouth daily.  , Disp: , Rfl:  .  pantoprazole (PROTONIX) 40 MG tablet, Take 1 tablet (40 mg total) by mouth daily., Disp: 90 tablet, Rfl: 1 .  predniSONE (DELTASONE) 10 MG tablet, Take  4 each am x 2 days,   2 each am x 2 days,  1 each am x 2 days and then resume the 2.5mg  dose, Disp: 14 tablet, Rfl: 0 .  predniSONE (DELTASONE) 2.5 MG tablet, TAKE ONE TABLET BY MOUTH ONCE DAILY WITH  BREAKFAST, Disp: 90 tablet, Rfl: 1 .  triamcinolone ointment (KENALOG) 0.1 %, Apply 1 application topically 2 (two) times daily., Disp: 30 g, Rfl: 5 .  albuterol (PROVENTIL HFA;VENTOLIN HFA) 108 (90 Base) MCG/ACT inhaler, Inhale 1-2 puffs into the lungs every 6 (six) hours as needed for wheezing or shortness of breath (cough). For shortness of breath/wheezing, Disp: 3.7 g, Rfl: 1 .  amoxicillin-clavulanate (AUGMENTIN) 875-125 MG tablet, Augmentin 875 mg take one pill twice daily  X 20 days, Disp: 40 tablet, Rfl: 0 .  benzonatate (TESSALON PERLES) 100 MG capsule, Take 2 capsules (200 mg total) by mouth 3 (three) times daily as needed for cough., Disp: 60 capsule, Rfl: 1 .  budesonide-formoterol (SYMBICORT) 80-4.5 MCG/ACT inhaler, Inhale 2 puffs into the lungs 2 (two) times daily., Disp: 1 Inhaler, Rfl: 3 .  HYDROcodone-acetaminophen (NORCO/VICODIN) 5-325 MG tablet, Take  1 tablet by mouth every 6 (six) hours as needed for moderate pain. July 2018, Disp: 60 tablet, Rfl: 0 .  ranitidine (ZANTAC) 300 MG tablet, Take 1 tablet (300 mg total) at bedtime by mouth., Disp: 90 tablet, Rfl: 1   Stefannie is allergic to avelox [moxifloxacin hcl in nacl]; bactrim [sulfamethoxazole-trimethoprim]; and sulfamethoxazole-trimethoprim.  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 Also  has a past surgical history that includes Nasal sinus surgery (06/2012).  Objective:   Vitals: BP 128/75 (BP Location: Left Arm)   Pulse 84   Temp 98.2 F (36.8 C) (Oral)   Resp 20   SpO2 100%   Physical Exam  Constitutional: She is oriented to person, place, and time. She appears well-developed and well-nourished.  HENT:  Mouth/Throat: Oropharynx is clear and moist.  Cardiovascular: Normal rate, regular rhythm and intact distal pulses. Exam reveals no gallop and no friction rub.  No murmur heard. Pulmonary/Chest: No respiratory distress. She has no wheezes. She has no rales.  Abdominal: Soft. Bowel sounds are normal. She exhibits no distension and no mass. There is no tenderness. There is no guarding.  Neurological: She is alert and oriented to person, place, and time.  Skin: Skin is warm and dry.  Psychiatric: She has a normal mood and affect.   ED ECG REPORT   Date: 06/11/2017  Rate: 82bpm  Rhythm: normal sinus rhythm  QRS Axis: normal  Intervals: normal  ST/T Wave abnormalities: normal  Conduction Disutrbances:none  Narrative Interpretation: sinus rhythm without acute changes compared to ecg from 10/12/2015, LVH noted.  Old EKG Reviewed: unchanged  I have personally reviewed the EKG tracing and agree with the computerized printout as noted.   Assessment and Plan :   Atypical chest pain  Chronic cough  Uncomplicated asthma, unspecified asthma severity, unspecified whether persistent   Follow up with Dr. Sherene Sires for continued work on chronic cough. Patient is to f/u with PCP for referral to cardiology. Maintain all medications as prescribed by pulmonologist. Maintain GERD medications. Return-to-clinic precautions discussed, patient verbalized understanding.    Wallis Bamberg,  [place] 06/11/17 2017

## 2017-06-11 NOTE — Discharge Instructions (Signed)
Please make sure you follow up with Dr. Sherene Sires for continued work on your chronic cough. Make sure you touch base with your PCP as well for a referral to a heart doctor to look into your chest pain further.

## 2017-06-14 ENCOUNTER — Telehealth: Payer: Self-pay | Admitting: Internal Medicine

## 2017-06-14 NOTE — Telephone Encounter (Signed)
Spoke with patient. She stated that she wanted to let MW know that she was seen at urgent care for chest pain. EKG only showed an enlarged heart. She is not having SOB or chest pains, just wanted to let MW know.   Advised patient that I would send a message to MW.   Nothing else needed at time of call.

## 2017-06-16 ENCOUNTER — Telehealth: Payer: Self-pay | Admitting: Family Medicine

## 2017-06-16 NOTE — Telephone Encounter (Signed)
Just an FYI

## 2017-06-16 NOTE — Telephone Encounter (Signed)
Copied from CRM [id number]. Topic: Quick Communication - See Telephone Encounter >> Jun 16, 2017 11:28 AM Arlyss Gandy, NT wrote: CRM for notification. See Telephone encounter for: Pt wanted to make Dr. Abner Greenspan aware that she went to Urgent care on Sunday with chest pains and the EKG showed she has an enlarge heart but nothing to be concerned with at this time. He also suggested to make an appt to follow-up with Dr. Abner Greenspan. Appt being made now.   06/16/17.

## 2017-06-27 ENCOUNTER — Encounter: Payer: Self-pay | Admitting: Podiatry

## 2017-06-27 ENCOUNTER — Ambulatory Visit (INDEPENDENT_AMBULATORY_CARE_PROVIDER_SITE_OTHER): Payer: BC Managed Care – PPO

## 2017-06-27 ENCOUNTER — Ambulatory Visit: Payer: BC Managed Care – PPO | Admitting: Podiatry

## 2017-06-27 VITALS — BP 132/78 | HR 88 | Resp 18

## 2017-06-27 DIAGNOSIS — M79674 Pain in right toe(s): Secondary | ICD-10-CM

## 2017-06-27 DIAGNOSIS — Q828 Other specified congenital malformations of skin: Secondary | ICD-10-CM | POA: Diagnosis not present

## 2017-06-27 DIAGNOSIS — B351 Tinea unguium: Secondary | ICD-10-CM

## 2017-06-27 DIAGNOSIS — M2042 Other hammer toe(s) (acquired), left foot: Secondary | ICD-10-CM

## 2017-06-27 DIAGNOSIS — M2041 Other hammer toe(s) (acquired), right foot: Secondary | ICD-10-CM

## 2017-06-27 DIAGNOSIS — M79675 Pain in left toe(s): Secondary | ICD-10-CM | POA: Diagnosis not present

## 2017-06-27 NOTE — Progress Notes (Signed)
Subjective:    Patient ID: Jamie Cox, female    DOB: Aug 08, 1960, 57 y.o.   MRN: [id number]  HPI  Chrys presents the office today for concerns of thick, painful, elongated toenails that she cannot trim herself.  She denies any redness or drainage or any swelling to the toenail sites.  She states that she does not take good care of her feet.  She also states that she has hammertoes which do not cause significant pain with wearing shoes but she does have to wear certain shoes to help take pressure off the areas.  Denies any redness or drainage or any swelling to the toes.  She denies any recent injury or trauma.  She has no other concerns today.    Review of Systems  All other systems reviewed and are negative.  Past Medical History:  Diagnosis Date  . Allergy   . Asthma   . Chronic sinusitis    Followed by Dr. Jenne Pane  . Environmental allergies   . GERD (gastroesophageal reflux disease)   . 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 History of chicken pox   . Hypercalcemia 07/19/2015  . Osteoporosis, unspecified    steroid induced  . Personal history of other diseases of circulatory system   . Rheumatoid arthritis(714.0)   . Unspecified deficiency anemia    microcytic  . Urticaria     Past Surgical History:  Procedure Laterality Date  . NASAL SINUS SURGERY  06/2012     Current Outpatient Medications:  .  acetaminophen (TYLENOL) 650 MG CR tablet, Take 650 mg by mouth every 8 (eight) hours as needed. For pain, Disp: , Rfl:  .  albuterol (PROVENTIL HFA;VENTOLIN HFA) 108 (90 Base) MCG/ACT inhaler, Inhale 1-2 puffs into the lungs every 6 (six) hours as needed for wheezing or shortness of breath (cough). For shortness of breath/wheezing, Disp: 3.7 g, Rfl: 1 .  amoxicillin-clavulanate (AUGMENTIN) 875-125 MG tablet, Augmentin 875 mg take one pill twice daily  X 20 days, Disp: 40 tablet, Rfl: 0 .  benzonatate (TESSALON PERLES) 100 MG capsule, Take 2 capsules (200 mg total)  by mouth 3 (three) times daily as needed for cough., Disp: 60 capsule, Rfl: 1 .  budesonide-formoterol (SYMBICORT) 80-4.5 MCG/ACT inhaler, Inhale 2 puffs into the lungs 2 (two) times daily., Disp: 1 Inhaler, Rfl: 3 .  calcium-vitamin D (CALCIUM 500/D) 500-200 MG-UNIT per tablet, 1 tablet., Disp: , Rfl:  .  Ferrous Fumarate-Folic Acid (HEMOCYTE-F) 324-1 MG TABS, Take 1 tablet by mouth daily., Disp: 30 each, Rfl: 5 .  fexofenadine (ALLEGRA) 180 MG tablet, Take 180 mg by mouth daily., Disp: , Rfl:  .  fluticasone (FLONASE) 50 MCG/ACT nasal spray, USE TWO SPRAY(S) IN EACH NOSTRIL ONCE DAILY, Disp: 16 g, Rfl: 3 .  HYDROcodone-acetaminophen (NORCO/VICODIN) 5-325 MG tablet, Take 1 tablet by mouth every 6 (six) hours as needed for moderate pain. July 2018, Disp: 60 tablet, Rfl: 0 .  hyoscyamine (LEVSIN, ANASPAZ) 0.125 MG tablet, Take 1 tablet (0.125 mg total) by mouth every 4 (four) hours as needed., Disp: 30 tablet, Rfl: 0 .  meloxicam (MOBIC) 7.5 MG tablet, Take 1-2 tablets by mouth daily as needed for pain., Disp: 60 tablet, Rfl: 3 .  meloxicam (MOBIC) 7.5 MG tablet, TAKE ONE TO TWO TABLETS BY MOUTH ONCE DAILY AS NEEDED FOR PAIN, Disp: 60 tablet, Rfl: 3 .  methotrexate 2.5 MG tablet, , Disp: , Rfl:  .  metoprolol succinate (TOPROL-XL) 25 MG 24 hr tablet, TAKE 1  TABLET BY MOUTH EVERY DAY, Disp: 30 tablet, Rfl: 0 .  montelukast (SINGULAIR) 10 MG tablet, TAKE 1 TABLET BY MOUTH AT BEDTIME, Disp: 30 tablet, Rfl: 5 .  Multiple Vitamin (MULTIVITAMIN) capsule, Take 1 capsule by mouth daily.  , Disp: , Rfl:  .  pantoprazole (PROTONIX) 40 MG tablet, Take 1 tablet (40 mg total) by mouth daily., Disp: 90 tablet, Rfl: 1 .  predniSONE (DELTASONE) 10 MG tablet, Take  4 each am x 2 days,   2 each am x 2 days,  1 each am x 2 days and then resume the 2.5mg  dose, Disp: 14 tablet, Rfl: 0 .  predniSONE (DELTASONE) 2.5 MG tablet, TAKE ONE TABLET BY MOUTH ONCE DAILY WITH  BREAKFAST, Disp: 90 tablet, Rfl: 1 .  ranitidine  (ZANTAC) 300 MG tablet, Take 1 tablet (300 mg total) at bedtime by mouth., Disp: 90 tablet, Rfl: 1 .  triamcinolone ointment (KENALOG) 0.1 %, Apply 1 application topically 2 (two) times daily., Disp: 30 g, Rfl: 5  Allergies  Allergen Reactions  . Avelox [Moxifloxacin Hcl In Nacl] Diarrhea, Nausea And Vomiting and Other (See Comments)    Dizziness, Cold chills   . Bactrim [Sulfamethoxazole-Trimethoprim] Hives  . Sulfamethoxazole-Trimethoprim Hives    Social History   Socioeconomic History  . Marital status: Married    Spouse name: Not on file  . Number of children: 2  . Years of education: Not on file  . Highest education level: Not on file  Social Needs  . Financial resource strain: Not on file  . Food insecurity - worry: Not on file  . Food insecurity - inability: Not on file  . Transportation needs - medical: Not on file  . Transportation needs - non-medical: Not on file  Occupational History  . Occupation: Data processing manager  Tobacco Use  . Smoking status: Never Smoker  . Smokeless tobacco: Never Used  Substance and Sexual Activity  . Alcohol use: No    Alcohol/week: 0.0 oz  . Drug use: No  . Sexual activity: Not Currently  Other Topics Concern  . Not on file  Social History Narrative  . Not on file        Objective:   Physical Exam General: AAO x3, NAD  Dermatological: Nails are hypertrophic, dystrophic, brittle, discolored, elongated 10. No surrounding redness or drainage. Tenderness nails 1-5 bilaterally.  Hyperkeratotic lesions submetatarsal 2 bilaterally as well as dorsal second toes along the PIPJ bilaterally.  Upon debridement there is no underlying ulceration, drainage or any signs of infection noted.  No open lesions or pre-ulcerative lesions are identified today.  Vascular: Dorsalis Pedis artery and Posterior Tibial artery pedal pulses are 2/4 bilateral with immedate capillary fill time. . There is no pain with calf compression, swelling, warmth,  erythema.   Neruologic: Grossly intact via light touch bilateral.  Protective threshold with Semmes Wienstein monofilament intact to all pedal sites bilateral.   Musculoskeletal: Hammertoes are present and there is plantar flexion of the metatarsal heads plantarly.  No pinpoint bony tenderness identified otherwise.  There is no overlying edema, erythema, increase in warmth bilaterally.  Muscular strength 5/5 in all groups tested bilateral.  Gait: Unassisted, Nonantalgic.      Assessment & Plan:   57 year old female with symptomatic onychomycosis, hammertoe deformity -Treatment options discussed including all alternatives, risks, and complications -Etiology of symptoms were discussed -Nails debrided 10 without complications or bleeding.  We discussed treatment options for nail fungus.  She is in start with Fungi-Nail.  Discussed  other prescription topicals. -Hyperkeratotic lesion Sharpy debrided x4 without any complications or bleeding -Dispensed pads to help with the hammertoes.  We discussed the change in shoes as well as the material to the shoes. -Daily foot inspection -Follow-up in 3 months or sooner if any problems arise. In the meantime, encouraged to call the office with any questions, concerns, change in symptoms.   Ovid Curd, DPM

## 2017-06-27 NOTE — Patient Instructions (Signed)
You can get fungi-nail at the drug store for the nails  ---  Hammer Toe Hammer toe is a change in the shape (a deformity) of your second, third, or fourth toe. The deformity causes the middle joint of your toe to stay bent. This causes pain, especially when you are wearing shoes. Hammer toe starts gradually. At first, the toe can be straightened. Gradually over time, the deformity becomes stiff and permanent. Early treatments to keep the toe straight may relieve pain. As the deformity becomes stiff and permanent, surgery may be needed to straighten the toe. What are the causes? Hammer toe is caused by abnormal bending of the toe joint that is closest to your foot. It happens gradually over time. This pulls on the muscles and connections (tendons) of the toe joint, making them weak and stiff. It is often related to wearing shoes that are too short or narrow and do not let your toes straighten. What increases the risk? You may be at greater risk for hammer toe if you:  Are female.  Are older.  Wear shoes that are too small.  Wear high-heeled shoes that pinch your toes.  Are a Advertising account planner.  Have a second toe that is longer than your big toe (first toe).  Injure your foot or toe.  Have arthritis.  Have a family history of hammer toe.  Have a nerve or muscle disorder.  What are the signs or symptoms? The main symptoms of this condition are pain and deformity of the toe. The pain is worse when wearing shoes, walking, or running. Other symptoms may include:  Corns or calluses over the bent part of the toe or between the toes.  Redness and a burning feeling on the toe.  An open sore that forms on the top of the toe.  Not being able to straighten the toe.  How is this diagnosed? This condition is diagnosed based on your symptoms and a physical exam. During the exam, your health care provider will try to straighten your toe to see how stiff the deformity is. You may also have  tests, such as:  A blood test to check for rheumatoid arthritis.  An X-ray to show how severe the deformity is.  How is this treated? Treatment for this condition will depend on how stiff the deformity is. Surgery is often needed. However, sometimes a hammer toe can be straightened without surgery. Treatments that do not involve surgery include:  Taping the toe into a straightened position.  Using pads and cushions to protect the toe (orthotics).  Wearing shoes that provide enough room for the toes.  Doing toe-stretching exercises at home.  Taking an NSAID to reduce pain and swelling.  If these treatments do not help or the toe cannot be straightened, surgery is the next option. The most common surgeries used to straighten a hammer toe include:  Arthroplasty. In this procedure, part of the joint is removed, and that allows the toe to straighten.  Fusion. In this procedure, cartilage between the two bones of the joint is taken out and the bones are fused together into one longer bone.  Implantation. In this procedure, part of the bone is removed and replaced with an implant to let the toe move again.  Flexor tendon transfer. In this procedure, the tendons that curl the toes down (flexor tendons) are repositioned.  Follow these instructions at home:  Take over-the-counter and prescription medicines only as told by your health care provider.  Do toe  straightening and stretching exercises as told by your health care provider.  Keep all follow-up visits as told by your health care provider. This is important. How is this prevented?  Wear shoes that give your toes enough room and do not cause pain.  Do not wear high-heeled shoes. Contact a health care provider if:  Your pain gets worse.  Your toe becomes red or swollen.  You develop an open sore on your toe. This information is not intended to replace advice given to you by your health care provider. Make sure you discuss  any questions you have with your health care provider. Document Released: 05/20/2000 Document Revised: 12/11/2015 Document Reviewed: 09/16/2015 Elsevier Interactive Patient Education  [employer].

## 2017-07-06 ENCOUNTER — Other Ambulatory Visit: Payer: Self-pay | Admitting: Family Medicine

## 2017-07-06 DIAGNOSIS — M069 Rheumatoid arthritis, unspecified: Secondary | ICD-10-CM

## 2017-07-06 MED ORDER — HYDROCODONE-ACETAMINOPHEN 5-325 MG PO TABS: 1.0000 | ORAL_TABLET | Freq: Four times a day (QID) | ORAL | 0 refills | Status: DC | PRN

## 2017-07-06 NOTE — Telephone Encounter (Signed)
Copied from CRM [id number]. Topic: Quick Communication - Rx Refill/Question >> Jul 06, 2017 10:05 AM Lelon Frohlich, RMA wrote: Medication: norco 5-325   Has the patient contacted their pharmacy?  yes   (Agent: If no, request that the patient contact the pharmacy for the refill.)   Preferred Pharmacy (with phone number or street name): [facility] in [place]   Agent: Please be advised that RX refills may take up to 3 business days. We ask that you follow-up with your pharmacy.

## 2017-07-06 NOTE — Telephone Encounter (Signed)
Requesting:Norco Contract: yes UDS:low risk nxt screen 11/07/17 Last OV:05/08/17 Next OV:08/08/17 Last Refill:05/08/17  #[id number]   Please advise

## 2017-07-07 ENCOUNTER — Ambulatory Visit: Payer: BC Managed Care – PPO | Admitting: Internal Medicine

## 2017-07-17 ENCOUNTER — Ambulatory Visit: Payer: BC Managed Care – PPO | Admitting: Internal Medicine

## 2017-07-17 ENCOUNTER — Ambulatory Visit (INDEPENDENT_AMBULATORY_CARE_PROVIDER_SITE_OTHER)
Admission: RE | Admit: 2017-07-17 | Discharge: 2017-07-17 | Disposition: A | Discharge status: Home / Self Care | Payer: BC Managed Care – PPO | Source: Ambulatory Visit | Attending: Internal Medicine | Admitting: Internal Medicine

## 2017-07-17 ENCOUNTER — Encounter: Payer: Self-pay | Admitting: Internal Medicine

## 2017-07-17 VITALS — BP 116/70 | HR 87 | Ht 61.0 in | Wt 105.0 lb

## 2017-07-17 DIAGNOSIS — J479 Bronchiectasis, uncomplicated: Secondary | ICD-10-CM

## 2017-07-17 NOTE — Progress Notes (Signed)
Subjective:    Patient ID: Jamie Cox, female    DOB: June 03, 1961 .   MRN: [id number]   Brief patient profile:   59  yobf never smoker dx RA in her early 20's steroid dep x around 2000 but also mtx, plaquenil in past per Dr Coral Spikes and also seen [facility] and also at [place] with ? ILD/bronchiecatasis or bronchiolitis related to RA.     History of Present Illness  August 10, 2010 1st pulmonary office eval cc for cough starting around 2006 but much worse x 4 months with constant need to clear the throat, mucus is clear, cough so hard looses breath. Much worse then ended in ER with dx of pna with cp on R rx as outpt with abx and cp now gone but still coughing. arthritis worse x 4 months and plans to go establish at [facility].  Imp was [place] plus/minus uacs  rec  1) Prednsione 10 mg 4 each am x 2days, 2x2days, 1x2days and stop and resume prednisone 2.5 mg daily  2) Stop boniva  3) Start Nexium Take one 30-60 min before first meal of the day and also Zantac 150 mg one at bedtime  4) GERD (REFLUX) diet   09/14/10  ov cc cough dry day = night,  pred at 2.5 mg per day cough was better on the higher doses but plan is to start plaquenil  per [facility].  Mostly dry.   >>Sinus CT pos+>Augmentin x 10d , zantac At bedtime       Sinus surgery in Jan 2014 > marked improvement         05/27/2016  Consultation/ re-establish /Daren Doswell re: recurrent cough in pt with RA pred 2.5 mg daily  Chief Complaint  Patient presents with  . Pulm Consult    Productive cough x4 months ago. Cough has been getting worse. Cloudy white phlegm.   on qvar 40 on avg 2puff daily and not using saba appropriately/ poor hfa  Onset was insidious x 4 m prior to OV  With progressively worse 24/7 cough / very congested with grey mucus  Doe = MMRC2 = can't walk a nl pace on a flat grade s sob but does fine slow and flat eg shopping Cough worse with exp to fumes/hot air not better on qvar  - better in cooler  conditions rec Stop qvar  Start symbicort 80 Take 2 puffs first thing in am and then another 2 puffs about 12 hours later.  Work on inhaler technique: Only use your albuterol as a rescue medication For cough > mucinex dm 1200 mg every 12 hours and the flutter valve as much as possible  Augmentin 875 mg take one pill twice daily  X 10 days -    06/28/2016  f/u ov/Kenith Trickel re: bronchiectasis s significant airflow obst by pfts  And sinusitis by ct  Chief Complaint  Patient presents with  . Follow-up    4 week follow up with PFT results. Still coughing.    worst time of cough at hs - overall at least 50% better, aware of purulent nasal discharge improves only while on augmentin and has not seen Dr Jenne Pane in years / not needing any saba on symb 80 2bid  > Jenne Pane 08/18/16 neg for infection > rec allergy eval   Allergy eval >  10/19/16  [place]  1. Chronic rhinitis - We cannot do testing today because you have take Claritin.  - Stop the fluticasone and start Dymista two sprays per  nostril daily. - Use nasal saline rinses prior to using the Dymista.  - Start the prednisone pack provided today: 10mg  twice daily for four days and then 10mg  once on the fifth day - Once we do testing, we can discuss further management strategies > neg histamine controls > neg RAST  2. Moderate persistent asthma, uncomplicated - Lung testing showed  - We will defer to Dr. Sherene Sires for management of your asthma and cough.  - Continue with the Symbicort two puffs twice daily. - Use a spacer to improve delivery of the Symbicort into your lungs.        03/06/2017  f/u ov/Liliana Dang re: bronchiectasis/ sinusitis/ chronic cough  Chief Complaint  Patient presents with  . Follow-up    Pt states had been coughing more- better since she took round of amoxicillin.  She denies any co's today. She uses her albuterol 2 x per wk on average.   Albuterol seems to help uses twice a week  No noct need for saba and really minimal noct or  early am cough  Temp changes triggers cough as well as exp to fumes but says cough is presently day > noct x "forever" even when she stays in same temp away from fumes No longer on GERD Rx Not limited by breathing from desired activities   rec Pantoprazole (protonix) 40 mg   Take  30-60 min before first meal of the day and zantac 300 mg one @  bedtime     05/05/2017  f/u ov/Aerilyn Slee re:  Brochiectasis/ AB/ on pred 2.5 for RA  Chief Complaint  Patient presents with  . Follow-up    Cough is progressively worse since she was last seen. She states she is coughing up beige to grey colored sputum. She went to go have MCT done and they would not perform the test due to excessive coughing.   cough has gradually worse since last ov and now  Day = noct /has become more productive Has flutter not using pred boost has helped in past  rec Augmentin 875 mg take one pill twice daily  X 20 days    Prednisone 10 mg take  4 each am x 2 days,   2 each am x 2 days,  1 each am x 2 days and then resume previous dose For cough > mucinex dm 1200 mg every 12 hours and use the flutter as much as you can and supplement with vicodin Work on inhaler technique:     NP eval recs 05/26/17 Refer to ENT , Dr. Jenne Pane.   . Add mucinex Twice daily  As needed  Cough/congestion  Use flutter valve several times a day .  Sputum culture today .  Delsym 2 tsp Twice daily  As needed  Cough .     07/17/2017  f/u ov/Arletta Lumadue re:  RA with bronchiectasis/? Bronchiolitis  Chief Complaint  Patient presents with  . Follow-up    Breathing is doing well.  She is using her albuterol inhaler 1 x per wk on average.   Dyspnea:  Ok as long as not coughing Cough: comes in spells during the day > noct,  Min mucoid sputum  Sleep: interupted by cough occastionally  but not typically flairing in am On gabapentin 300 tid x 3 weeks  Has not used symb yet am of ov at 11am    No obvious patterns in  day to day or daytime variability or assoc truly   excess/ purulent sputum or mucus plugs  or hemoptysis or cp or chest tightness, subjective wheeze or overt sinus or hb symptoms. No unusual exposure hx or h/o childhood pna/ asthma or knowledge of premature birth.   Also denies any obvious fluctuation of symptoms with weather or environmental changes or other aggravating or alleviating factors except as outlined above   Current Allergies, Complete Past Medical History, Past Surgical History, Family History, and Social History were reviewed in [employer] record.  ROS  The following are not active complaints unless bolded Hoarseness, sore throat, dysphagia, dental problems, itching, sneezing,  nasal congestion or discharge of excess mucus or purulent secretions, ear ache,   fever, chills, sweats, unintended wt loss or wt gain, classically pleuritic or exertional cp,  orthopnea pnd or leg swelling, presyncope, palpitations, abdominal pain, anorexia, nausea, vomiting, diarrhea  or change in bowel habits or change in bladder habits, change in stools or change in urine, dysuria, hematuria,  rash, arthralgias, visual complaints, headache, numbness, weakness or ataxia or problems with walking or coordination,  change in mood/affect or memory.        Current Meds  Medication Sig  . acetaminophen (TYLENOL) 650 MG CR tablet Take 650 mg by mouth every 8 (eight) hours as needed. For pain  . albuterol (PROVENTIL HFA;VENTOLIN HFA) 108 (90 Base) MCG/ACT inhaler Inhale 1-2 puffs into the lungs every 6 (six) hours as needed for wheezing or shortness of breath (cough). For shortness of breath/wheezing  . benzonatate (TESSALON PERLES) 100 MG capsule Take 2 capsules (200 mg total) by mouth 3 (three) times daily as needed for cough.  . budesonide-formoterol (SYMBICORT) 80-4.5 MCG/ACT inhaler Inhale 2 puffs into the lungs 2 (two) times daily.  . calcium-vitamin D (CALCIUM 500/D) 500-200 MG-UNIT per tablet 1 tablet.  . Ferrous Fumarate-Folic Acid  (HEMOCYTE-F) 324-1 MG TABS Take 1 tablet by mouth daily.  . fexofenadine (ALLEGRA) 180 MG tablet Take 180 mg by mouth daily.  . fluticasone (FLONASE) 50 MCG/ACT nasal spray USE TWO SPRAY(S) IN EACH NOSTRIL ONCE DAILY  . gabapentin (NEURONTIN) 300 MG capsule Take 1 capsule by mouth 3 (three) times daily.  Marland Kitchen HYDROcodone-acetaminophen (NORCO/VICODIN) 5-325 MG tablet Take 1 tablet by mouth every 6 (six) hours as needed for moderate pain. July 2018  . hyoscyamine (LEVSIN, ANASPAZ) 0.125 MG tablet Take 1 tablet (0.125 mg total) by mouth every 4 (four) hours as needed.  . meloxicam (MOBIC) 7.5 MG tablet Take 1-2 tablets by mouth daily as needed for pain.  . methotrexate 2.5 MG tablet   . metoprolol succinate (TOPROL-XL) 25 MG 24 hr tablet TAKE 1 TABLET BY MOUTH EVERY DAY  . montelukast (SINGULAIR) 10 MG tablet TAKE 1 TABLET BY MOUTH AT BEDTIME  . Multiple Vitamin (MULTIVITAMIN) capsule Take 1 capsule by mouth daily.    . pantoprazole (PROTONIX) 40 MG tablet Take 1 tablet (40 mg total) by mouth daily.  . predniSONE (DELTASONE) 2.5 MG tablet TAKE ONE TABLET BY MOUTH ONCE DAILY WITH  BREAKFAST  . ranitidine (ZANTAC) 300 MG tablet Take 1 tablet (300 mg total) at bedtime by mouth.  . triamcinolone ointment (KENALOG) 0.1 % Apply 1 application topically 2 (two) times daily.                       Objective:   Physical Exam  Chronically ill bf severe coughing fits to point of not being able to carry on a conversation    07/17/2017     105  05/05/2017  102  03/06/2017     101  06/28/2016       98    05/27/16 98 lb 12.8 oz (44.8 kg)  05/09/16 101 lb 3.2 oz (45.9 kg)  05/04/16 100 lb (45.4 kg)   Wt 105 12/23/2010    HEENT: nl dentition, turbinates bilaterally, and oropharynx. Nl external ear canals without cough reflex   NECK :  without JVD/Nodes/TM/ nl carotid upstrokes bilaterally   LUNGS: no acc muscle use,  Nl contour chest which is clear to A and P bilaterally without cough on insp  or exp maneuvers   CV:  RRR  no s3 or murmur or increase in P2, and no edema   ABD:  soft and nontender with nl inspiratory excursion in the supine position. No bruits or organomegaly appreciated, bowel sounds nl  MS:  Nl gait/ ext warm without deformities, calf tenderness, cyanosis or clubbing No obvious joint restrictions   SKIN: warm and dry without lesions    NEURO:  alert, approp, nl sensorium with  no motor or cerebellar deficits apparent.         CXR PA and Lateral:   07/17/2017 :    I personally reviewed images and agree with radiology impression as follows:    The lungs are hyperinflated with mild hemidiaphragm flattening. The interstitial markings are coarse but stable. There is no alveolar infiltrate or pleural effusion. The heart and pulmonary vascularity are normal. There is calcification in the wall of the aortic arch. The bony thorax exhibits no acute abnormality. The trachea is midline.   Assessment & Plan:

## 2017-07-17 NOTE — Patient Instructions (Addendum)
Use the flutter valve as much as you can   Work on inhaler technique:  relax and gently blow all the way out then take a nice smooth deep breath back in, triggering the inhaler at same time you start breathing in.  Hold for up to 5 seconds if you can. Blow out thru nose. Rinse and gargle with water when done    Please remember to go to the  x-ray department downstairs in the basement  for your tests - we will call you with the results when they are available.   See Tammy NP  In 4  weeks with all your medications, even over the counter meds, separated in two separate bags, the ones you take no matter what vs the ones you stop once you feel better and take only as needed when you feel you need them.   Tammy  will generate for you a new user friendly medication calendar that will put [place] all on the same page re: your medication use.     Without this process, it simply isn't possible to assure that we are providing  your outpatient care  with  the attention to detail we feel you deserve.   If we cannot assure that you're getting that kind of care,  then we cannot manage your problem effectively from this clinic.  Once you have seen Tammy and we are sure that we're all on the same page with your medication use she will arrange follow up with me.

## 2017-07-18 ENCOUNTER — Encounter: Payer: Self-pay | Admitting: Internal Medicine

## 2017-07-18 NOTE — Assessment & Plan Note (Signed)
See HRCT 06/28/2016 assoc with pansinusitis > referred to [facility] > 08/18/16 no evidence infection > rec allergy eval > neg RAST  - PFT's  06/28/2016   Nl obstruction p am symbicort 80 x 2  - Spirometry 03/21/17   Minimal airflow obst off inhalers overnight  - Flutter valve use reviewed 05/05/2017  - 07/17/2017  After extensive coaching inhaler device  effectiveness =    75% from a baseline of near 0   Cough has upper airway features now despite the addition of gabapentin 300 tid and I strongly doubt she's taking her meds as instructed either consistently (did not yet take am symb) or effectively (poor hfa) so needs to return .with all meds in hand using a trust but verify approach to confirm accurate Medication  Reconciliation The principal here is that until we are certain that the  patients are doing what we've asked, it makes no sense to ask them to do more.    To keep things simple, I have asked the patient to first separate medicines that are perceived as maintenance, that is to be taken daily "no matter what", from those medicines that are taken on only on an as-needed basis and I have given the patient examples of both, and then return to see our NP to generate a  detailed  medication calendar which should be followed until the next physician sees the patient and updates it.    I had an extended discussion with the patient reviewing all relevant studies completed to date and  lasting 15 to 20 minutes of a 25 minute visit    Each maintenance medication was reviewed in detail including most importantly the difference between maintenance and prns and under what circumstances the prns are to be triggered using an action plan format that is not reflected in the computer generated alphabetically organized AVS.    Please see AVS for specific instructions unique to this visit that I personally wrote and verbalized to the the pt in detail and then reviewed with pt  by my nurse highlighting any  changes in  therapy recommended at today's visit to their plan of care.

## 2017-07-18 NOTE — Progress Notes (Signed)
lmom with results

## 2017-07-20 IMAGING — CT CT PARANASAL SINUSES LIMITED
1 of 2 series · 7 of 10 positions shown, 9 images · non-contrast
Comparison: 07/25/2012

CLINICAL DATA: Chronic sinus drainage

EXAM:
CT PARANASAL SINUS LIMITED WITHOUT CONTRAST
TECHNIQUE: Non-contiguous multidetector CT images of the paranasal sinuses were
obtained in a single plane without contrast.

[Series 4: limited sinus st · axial · 0.25mm/px · z∈[+131,+191]mm · 7 of 9 slices shown, 9 images]
[im 2/9  brain]
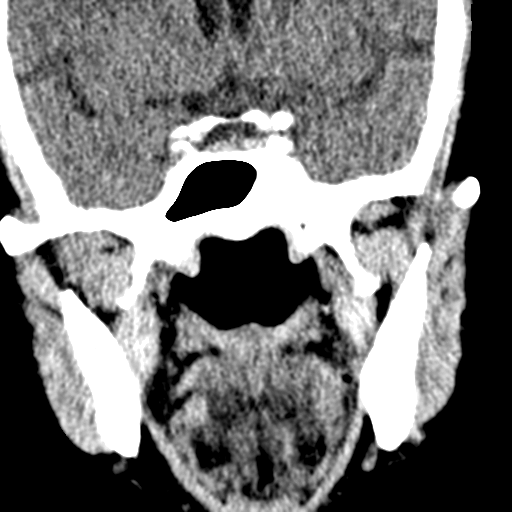
[im 2/9  bone]
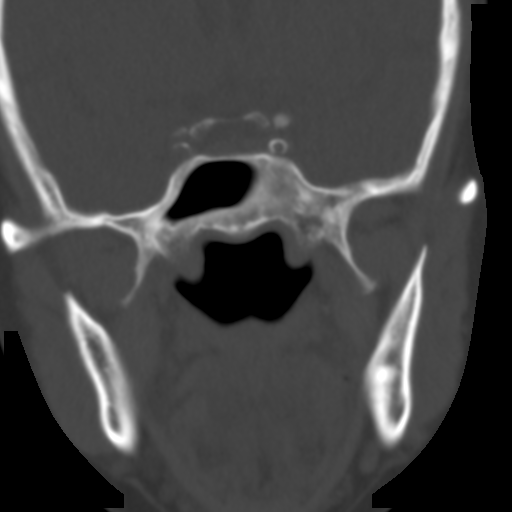
[im 3/9  bone]
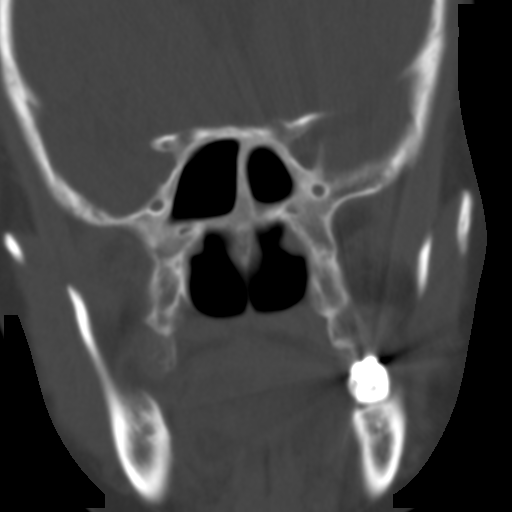
[im 4/9  bone]
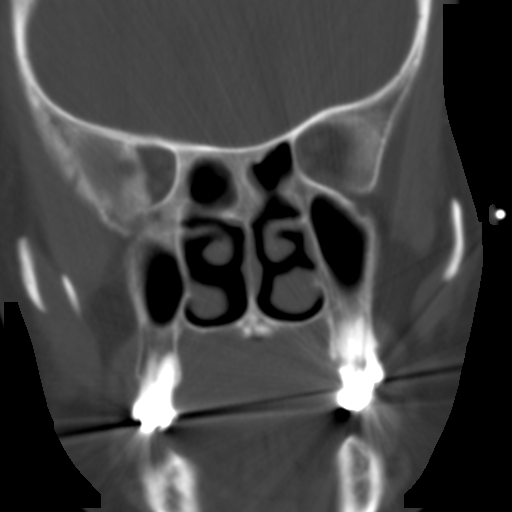
[im 5/9  bone]
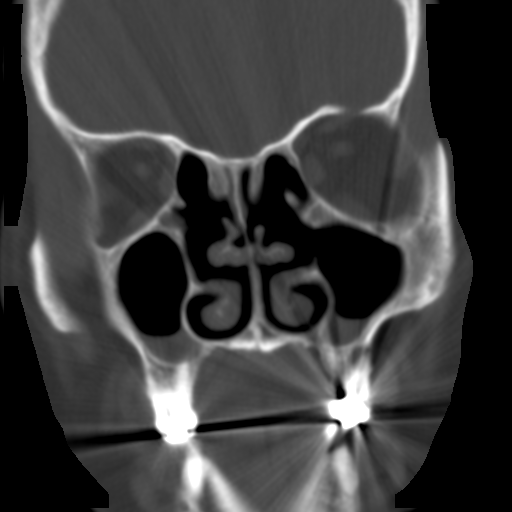
[im 6/9  brain]
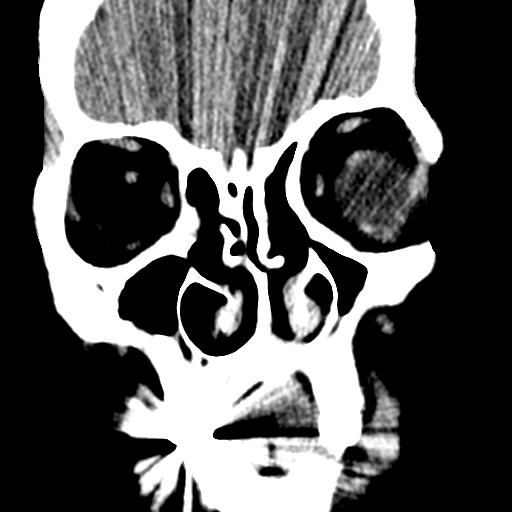
[im 6/9  bone]
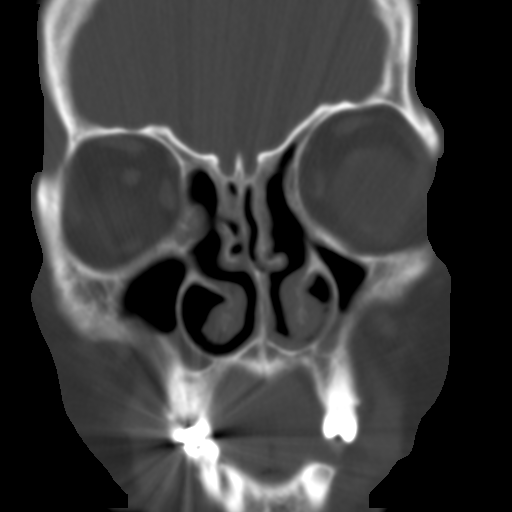
[im 7/9  bone]
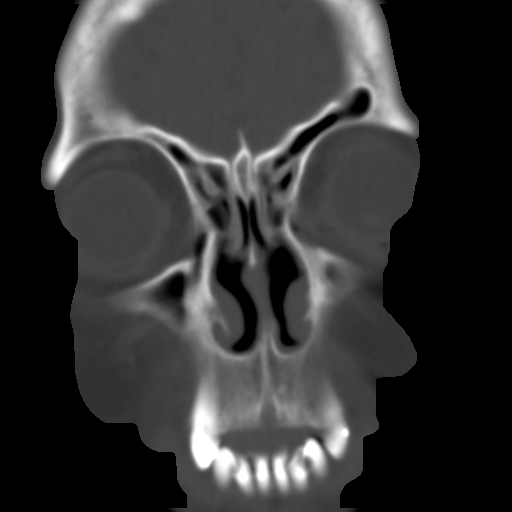
[im 8/9  bone]
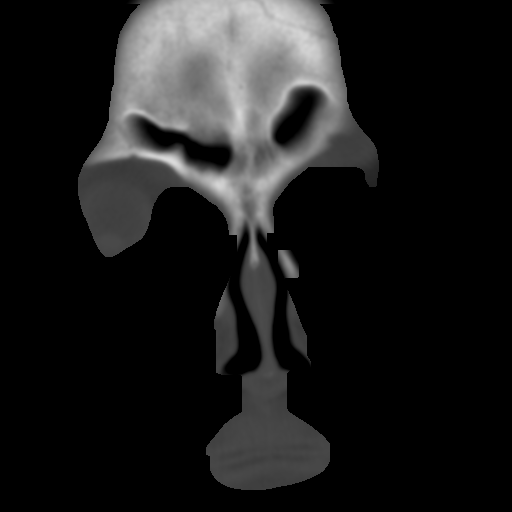

[7 of 10 positions shown; findings below may reference images not displayed]

FINDINGS: Air-fluid levels are seen in both maxillary sinuses and sphenoid
sinuses compatible with acute sinusitis. Postoperative changes
bilaterally. Visualized orbital soft tissues unremarkable.
IMPRESSION: Short air-fluid levels within the maxillary sinuses bilaterally as
well as sphenoid sinuses compatible with acute sinusitis

Postoperative changes.

## 2017-07-21 ENCOUNTER — Telehealth: Payer: Self-pay

## 2017-07-21 ENCOUNTER — Telehealth: Payer: Self-pay | Admitting: Family Medicine

## 2017-07-21 NOTE — Telephone Encounter (Signed)
[employer] benefits received PA is required $80 copay to cover admin, prolia   Patient may owe approximately $80 OOP  **sent to [facility] for PA**  Letter mailed to inform patient of benefits and to schedule

## 2017-07-21 NOTE — Telephone Encounter (Signed)
PA initiated via Covermymeds; KEY: [id number]. Awaiting determination.

## 2017-07-24 NOTE — Telephone Encounter (Signed)
PA approved for [employer]- effective 07/21/2017 to 07/22/2019.

## 2017-08-08 ENCOUNTER — Ambulatory Visit: Payer: BC Managed Care – PPO | Admitting: Family Medicine

## 2017-08-08 ENCOUNTER — Encounter: Payer: Self-pay | Admitting: Family Medicine

## 2017-08-08 VITALS — BP 112/76 | HR 86 | Temp 97.4°F | Resp 18 | Wt 104.0 lb

## 2017-08-08 DIAGNOSIS — I517 Cardiomegaly: Secondary | ICD-10-CM

## 2017-08-08 DIAGNOSIS — M069 Rheumatoid arthritis, unspecified: Secondary | ICD-10-CM | POA: Diagnosis not present

## 2017-08-08 DIAGNOSIS — R0789 Other chest pain: Secondary | ICD-10-CM

## 2017-08-08 DIAGNOSIS — R053 Chronic cough: Secondary | ICD-10-CM

## 2017-08-08 DIAGNOSIS — R05 Cough: Secondary | ICD-10-CM

## 2017-08-08 DIAGNOSIS — K219 Gastro-esophageal reflux disease without esophagitis: Secondary | ICD-10-CM | POA: Diagnosis not present

## 2017-08-08 MED ORDER — MELOXICAM 7.5 MG PO TABS: ORAL_TABLET | ORAL | 3 refills | Status: DC

## 2017-08-08 MED ORDER — PANTOPRAZOLE SODIUM 40 MG PO TBEC: 40.0000 mg | DELAYED_RELEASE_TABLET | Freq: Every day | ORAL | 1 refills | Status: DC

## 2017-08-08 MED ORDER — PREDNISONE 2.5 MG PO TABS: ORAL_TABLET | ORAL | 1 refills | Status: DC

## 2017-08-08 MED ORDER — FLUTICASONE PROPIONATE 50 MCG/ACT NA SUSP: NASAL | 3 refills | Status: DC

## 2017-08-08 MED ORDER — HYDROCODONE-ACETAMINOPHEN 5-325 MG PO TABS: 1.0000 | ORAL_TABLET | Freq: Four times a day (QID) | ORAL | 0 refills | Status: DC | PRN

## 2017-08-08 NOTE — Progress Notes (Signed)
Subjective:  I acted as a Neurosurgeon for Dr. Abner Greenspan. [place], [place]  Patient ID: Jamie Cox, female    DOB: September 10, 1960, 57 y.o.   MRN: [id number]  No chief complaint on file.   HPI  Patient is in today for 3 month follow up. Patient presented to an urgent care with what she describes is episodes of sharp chest pain about a month ago.  She had several episodes without associated symptoms.  She reports workup there was unremarkable except that chest x-ray confirmed some cardiomegaly. She has not had any further chest pain since then. She continues to struggle with chronic cough.notes inhalers help some but then cough recurs. She did not take the gabapentin given to her by ENT long as it caused somnolence. She is interested in trying it again. No fevers or chills. Does endorse fatigue, myalgias and back pain. She continues to work full time. Denies CP/palp/SOB/HA/congestion/fevers/GI or GU c/o. Taking meds as prescribed  Patient Care Team: [name], MD as PCP - General (Family Medicine) Mardella Layman, MD as Consulting Physician (Gastroenterology)   Past Medical History:  Diagnosis Date  . Allergy   . Asthma   . Atypical chest pain   . Chronic sinusitis    Followed by Dr. Jenne Pane  . Environmental allergies   . GERD (gastroesophageal reflux disease)   . 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 History of chicken pox   . Hypercalcemia 07/19/2015  . Osteoporosis, unspecified    steroid induced  . Personal history of other diseases of circulatory system   . Rheumatoid arthritis(714.0)   . Unspecified deficiency anemia    microcytic  . Urticaria     Past Surgical History:  Procedure Laterality Date  . NASAL SINUS SURGERY  06/2012    Family History  Problem Relation Age of Onset  . Liver cancer Maternal Grandmother   . Hypertension Maternal Grandmother   . Heart attack Maternal Grandmother   . Scleroderma Mother   . Heart disease Mother   . Kidney disease Mother    . Prostate cancer Father   . Diabetes Father   . Heart disease Father        CHF  . Arthritis Father        s/p hip replacement  . Cancer Father        lung cancer with mets, smoker  . Lung cancer Paternal Uncle   . Coronary artery disease Paternal Grandmother   . Hypertension Paternal Grandmother   . Liver cancer Paternal Grandmother   . Hyperlipidemia Son   . Alzheimer's disease Paternal Grandfather   . Other Sister        Cardiomegaly  . Arthritis Sister   . Heart disease Sister        rare  . Sleep apnea Brother   . Arthritis Daughter   . Lupus Cousin   . Arthritis Cousin   . Colon cancer Neg Hx     Social History   Socioeconomic History  . Marital status: Married    Spouse name: Not on file  . Number of children: 2  . Years of education: Not on file  . Highest education level: Not on file  Social Needs  . Financial resource strain: Not on file  . Food insecurity - worry: Not on file  . Food insecurity - inability: Not on file  . Transportation needs - medical: Not on file  . Transportation needs - non-medical: Not on file  Occupational History  .  Occupation: Data processing manager  Tobacco Use  . Smoking status: Never Smoker  . Smokeless tobacco: Never Used  Substance and Sexual Activity  . Alcohol use: No    Alcohol/week: 0.0 oz  . Drug use: No  . Sexual activity: Not Currently  Other Topics Concern  . Not on file  Social History Narrative  . Not on file    Outpatient Medications Prior to Visit  Medication Sig Dispense Refill  . acetaminophen (TYLENOL) 650 MG CR tablet Take 650 mg by mouth every 8 (eight) hours as needed. For pain    . albuterol (PROVENTIL HFA;VENTOLIN HFA) 108 (90 Base) MCG/ACT inhaler Inhale 1-2 puffs into the lungs every 6 (six) hours as needed for wheezing or shortness of breath (cough). For shortness of breath/wheezing 3.7 g 1  . budesonide-formoterol (SYMBICORT) 80-4.5 MCG/ACT inhaler Inhale 2 puffs into the lungs 2 (two) times  daily. 1 Inhaler 3  . calcium-vitamin D (CALCIUM 500/D) 500-200 MG-UNIT per tablet 1 tablet.    . Ferrous Fumarate-Folic Acid (HEMOCYTE-F) 324-1 MG TABS Take 1 tablet by mouth daily. 30 each 5  . fexofenadine (ALLEGRA) 180 MG tablet Take 180 mg by mouth daily.    Marland Kitchen gabapentin (NEURONTIN) 300 MG capsule Take 1 capsule by mouth 3 (three) times daily.    . hyoscyamine (LEVSIN, ANASPAZ) 0.125 MG tablet Take 1 tablet (0.125 mg total) by mouth every 4 (four) hours as needed. 30 tablet 0  . methotrexate 2.5 MG tablet     . metoprolol succinate (TOPROL-XL) 25 MG 24 hr tablet TAKE 1 TABLET BY MOUTH EVERY DAY 30 tablet 0  . montelukast (SINGULAIR) 10 MG tablet TAKE 1 TABLET BY MOUTH AT BEDTIME 30 tablet 5  . Multiple Vitamin (MULTIVITAMIN) capsule Take 1 capsule by mouth daily.      . ranitidine (ZANTAC) 300 MG tablet Take 1 tablet (300 mg total) at bedtime by mouth. 90 tablet 1  . triamcinolone ointment (KENALOG) 0.1 % Apply 1 application topically 2 (two) times daily. 30 g 5  . benzonatate (TESSALON PERLES) 100 MG capsule Take 2 capsules (200 mg total) by mouth 3 (three) times daily as needed for cough. (Patient not taking: Reported on 08/09/2017) 60 capsule 1  . fluticasone (FLONASE) 50 MCG/ACT nasal spray USE TWO SPRAY(S) IN EACH NOSTRIL ONCE DAILY 16 g 3  . HYDROcodone-acetaminophen (NORCO/VICODIN) 5-325 MG tablet Take 1 tablet by mouth every 6 (six) hours as needed for moderate pain. July 2018 60 tablet 0  . meloxicam (MOBIC) 7.5 MG tablet Take 1-2 tablets by mouth daily as needed for pain. 60 tablet 3  . pantoprazole (PROTONIX) 40 MG tablet Take 1 tablet (40 mg total) by mouth daily. 90 tablet 1  . predniSONE (DELTASONE) 2.5 MG tablet TAKE ONE TABLET BY MOUTH ONCE DAILY WITH  BREAKFAST 90 tablet 1   No facility-administered medications prior to visit.     Allergies  Allergen Reactions  . Avelox [Moxifloxacin Hcl In Nacl] Diarrhea, Nausea And Vomiting and Other (See Comments)    Dizziness, Cold  chills   . Bactrim [Sulfamethoxazole-Trimethoprim] Hives  . Sulfamethoxazole-Trimethoprim Hives    Review of Systems  Constitutional: Positive for malaise/fatigue. Negative for fever.  HENT: Negative for congestion.   Eyes: Negative for blurred vision.  Respiratory: Positive for cough and shortness of breath.   Cardiovascular: Positive for chest pain. Negative for palpitations and leg swelling.  Gastrointestinal: Negative for abdominal pain, blood in stool and nausea.  Genitourinary: Negative for dysuria and frequency.  Musculoskeletal:  Positive for back pain and myalgias. Negative for falls.  Skin: Negative for rash.  Neurological: Negative for dizziness, loss of consciousness and headaches.  Endo/Heme/Allergies: Negative for environmental allergies.  Psychiatric/Behavioral: Negative for depression. The patient is not nervous/anxious.        Objective:    Physical Exam  Constitutional: She is oriented to person, place, and time. She appears well-developed and well-nourished. No distress.  HENT:  Head: Normocephalic and atraumatic.  Nose: Nose normal.  Eyes: Right eye exhibits no discharge. Left eye exhibits no discharge.  Neck: Normal range of motion. Neck supple.  Cardiovascular: Normal rate and regular rhythm.  No murmur heard. Pulmonary/Chest: Effort normal and breath sounds normal.  Abdominal: Soft. Bowel sounds are normal. There is no tenderness.  Musculoskeletal: She exhibits no edema.  Neurological: She is alert and oriented to person, place, and time.  Skin: Skin is warm and dry.  Psychiatric: She has a normal mood and affect.  Nursing note and vitals reviewed.   BP 112/76 (BP Location: Left Arm, Patient Position: Sitting, Cuff Size: Normal)   Pulse 86   Temp (!) 97.4 F (36.3 C) (Oral)   Resp 18   Wt 104 lb (47.2 kg)   SpO2 96%   BMI 19.65 kg/m  Wt Readings from Last 3 Encounters:  08/09/17 104 lb (47.2 kg)  08/08/17 104 lb (47.2 kg)  07/17/17 105 lb  (47.6 kg)   BP Readings from Last 3 Encounters:  08/09/17 122/72  08/08/17 112/76  07/17/17 116/70     Immunization History  Administered Date(s) Administered  . Influenza Split 04/05/2011  . Influenza Whole 04/10/2008, 05/15/2009, 05/21/2010, 02/05/2012  . Influenza,inj,Quad PF,6+ Mos 08/06/2013, 05/09/2016  . Pneumococcal Polysaccharide-23 07/01/2010  . Td 05/15/2009    Health Maintenance  Topic Date Due  . MAMMOGRAM  08/06/2017  . INFLUENZA VACCINE  09/03/2017 (Originally 01/04/2017)  . PAP SMEAR  03/05/2019  . TETANUS/TDAP  05/16/2019  . COLONOSCOPY  10/16/2022  . Hepatitis C Screening  Completed  . HIV Screening  Completed    Lab Results  Component Value Date   WBC 5.2 05/08/2017   HGB 11.2 (L) 05/08/2017   HCT 35.8 (L) 05/08/2017   PLT 260.0 05/08/2017   GLUCOSE 86 05/08/2017   CHOL 190 07/13/2015   TRIG 111.0 07/13/2015   HDL 73.90 07/13/2015   LDLCALC 94 07/13/2015   ALT 19 05/08/2017   AST 25 05/08/2017   NA 140 05/08/2017   K 4.9 05/08/2017   CL 107 05/08/2017   CREATININE 0.67 05/08/2017   BUN 13 05/08/2017   CO2 26 05/08/2017   TSH 0.40 05/08/2017    Lab Results  Component Value Date   TSH 0.40 05/08/2017   Lab Results  Component Value Date   WBC 5.2 05/08/2017   HGB 11.2 (L) 05/08/2017   HCT 35.8 (L) 05/08/2017   MCV 76.9 (L) 05/08/2017   PLT 260.0 05/08/2017   Lab Results  Component Value Date   NA 140 05/08/2017   K 4.9 05/08/2017   CO2 26 05/08/2017   GLUCOSE 86 05/08/2017   BUN 13 05/08/2017   CREATININE 0.67 05/08/2017   BILITOT 0.2 05/08/2017   ALKPHOS 55 05/08/2017   AST 25 05/08/2017   ALT 19 05/08/2017   PROT 8.0 05/08/2017   ALBUMIN 4.2 05/08/2017   CALCIUM 10.3 05/08/2017   CALCIUM 10.3 05/08/2017   GFR 116.00 05/08/2017   Lab Results  Component Value Date   CHOL 190 07/13/2015   Lab  Results  Component Value Date   HDL 73.90 07/13/2015   Lab Results  Component Value Date   LDLCALC 94 07/13/2015   Lab  Results  Component Value Date   TRIG 111.0 07/13/2015   Lab Results  Component Value Date   CHOLHDL 3 07/13/2015   No results found for: HGBA1C       Assessment & Plan:   Problem List Items Addressed This Visit    Rheumatoid arthritis (HCC)    Struggles with daily pain but continues to work full time. She gets modest relief with some Hydrocodone use so may continue meds. UDS and contract UTD      Relevant Medications   predniSONE (DELTASONE) 2.5 MG tablet   meloxicam (MOBIC) 7.5 MG tablet   HYDROcodone-acetaminophen (NORCO/VICODIN) 5-325 MG tablet   Chronic cough    Cough persists intermittently.  She does note that inhaler seem to help some.  She had been started on gabapentin by ENT so she took it for a short time but she felt it caused excessive sedation so she stopped it.  She is interested in restarting a low-dose at bedtime so prescription is given today and she will try to take it for the next couple of months to confirm response.      Relevant Medications   fluticasone (FLONASE) 50 MCG/ACT nasal spray   GERD (gastroesophageal reflux disease)    Encouraged daily Ranitidine. Avoid offending foods, start probiotics. Do not eat large meals in late evening and consider raising head of bed.       Relevant Medications   pantoprazole (PROTONIX) 40 MG tablet   Atypical chest pain    Patient presented to an urgent care with what she describes is episodes of sharp chest pain about a month ago.  She had several episodes without associated symptoms.  She reports workup there was unremarkable except that chest x-ray confirmed some cardiomegaly.  We will request records.  Due to her risk factors we will also refer her to cardiology for further consideration at this time.      Relevant Orders   Ambulatory referral to Cardiology    Other Visit Diagnoses    Cardiomegaly    -  Primary   Relevant Orders   Ambulatory referral to Cardiology      I have discontinued Aram Beecham  Roulston's HYDROcodone-acetaminophen. I have also changed her HYDROcodone-acetaminophen. Additionally, I am having her maintain her acetaminophen, multivitamin, calcium-vitamin D, hyoscyamine, albuterol, budesonide-formoterol, methotrexate, triamcinolone ointment, metoprolol succinate, ranitidine, montelukast, fexofenadine, Ferrous Fumarate-Folic Acid, gabapentin, predniSONE, meloxicam, fluticasone, and pantoprazole.  Meds ordered this encounter  Medications  . predniSONE (DELTASONE) 2.5 MG tablet    Sig: TAKE ONE TABLET BY MOUTH ONCE DAILY WITH  BREAKFAST    Dispense:  90 tablet    Refill:  1  . meloxicam (MOBIC) 7.5 MG tablet    Sig: Take 1-2 tablets by mouth daily as needed for pain.    Dispense:  60 tablet    Refill:  3  . fluticasone (FLONASE) 50 MCG/ACT nasal spray    Sig: USE TWO SPRAY(S) IN EACH NOSTRIL ONCE DAILY    Dispense:  48 g    Refill:  3    Please consider 90 day supplies to promote better adherence  . pantoprazole (PROTONIX) 40 MG tablet    Sig: Take 1 tablet (40 mg total) by mouth daily.    Dispense:  90 tablet    Refill:  1  . DISCONTD: HYDROcodone-acetaminophen (NORCO/VICODIN) 5-325 MG tablet  Sig: Take 1 tablet by mouth every 6 (six) hours as needed for moderate pain. July 2018    Dispense:  60 tablet    Refill:  0  . HYDROcodone-acetaminophen (NORCO/VICODIN) 5-325 MG tablet    Sig: Take 1 tablet by mouth every 6 (six) hours as needed for moderate pain. March 2019    Dispense:  60 tablet    Refill:  0    CMA served as Neurosurgeon during this visit. History, Physical and Plan performed by medical provider. Documentation and orders reviewed and attested to.  Danise Edge, MD

## 2017-08-08 NOTE — Patient Instructions (Signed)
Hypertrophic Cardiomyopathy Hypertrophic cardiomyopathy (HCM) is a heart condition in which part of the heart muscle gets too thick. The condition can cause a dangerous and abnormal heart rhythm. It can also weaken the heart over time. The heart is divided into four chambers. The thickening usually affects the pumping chamber on the lower left (left ventricle). HCM may also affect the valve that lets blood flow out of the left ventricle (mitral valve). Symptoms often begin at about age 59. What are the causes? This condition is usually caused by abnormal genes that control heart muscle growth. These abnormal genes are passed down through families (are inherited). What increases the risk? This condition is more likely to develop in people with a family history of HCM. What are the signs or symptoms? Symptoms of this condition include:  Shortness of breath, especially after exercising or lying down.  Chest pain.  Dizziness.  Fatigue.  Irregular or fast heart rate.  Fainting, especially after physical activity.  How is this diagnosed? This condition is diagnosed based on the results of a physical exam that involves checking for an abnormal heart sound (heart murmur). Tests may be done, including:  An electrocardiogram (ECG). This test records your heart's electrical activity.  An echocardiogram. This test can show whether your left ventricle is enlarged and whether it fills slowly.  A Doppler test. This test shows irregular blood flow and pressure differences inside the heart.  A chest X-ray. This test can show whether your heart is enlarged.  An MRI.  How is this treated? Treatment for HCM depends on how severe your symptoms are. There are several options for treatment, including.  Medicine. Medicines can be given to: ? Reduce the workload of your heart. ? Lower your blood pressure. ? Thin your blood and prevent clots.  A device. Devices that can be used to treat this  condition include: ? A pacemaker. This device helps to control your heartbeat. ? A defibrillator. This device restores a normal heart rhythm.  Surgery. This may include a procedure to: ? Inject alcohol into the small blood vessels that supply your heart muscle (alcohol septal ablation). This is done during a procedure called cardiac catheterization. The goal is to cause the muscle to become thinner. ? Remove part of the wall that divides the right and left sides of the heart (septum). ? Replace the mitral valve.  Follow these instructions at home:  Avoid strenuous exercise and activities, like heavy lifting or shoveling snow.  Make sure the members of your household know how to do CPR in case of an emergency.  Take over-the-counter and prescription medicines only as told by your health care provider.  Follow a healthy diet and maintain a healthy weight. If you need help with losing weight, ask your health care provider.  Limit alcohol intake to no more than 1 drink per day for nonpregnant women and 2 drinks per day for men. One drink equals 12 oz of beer, 5 oz of wine, or 1 oz of hard liquor.  Do not use any products that contain nicotine or tobacco, such as cigarettes and e-cigarettes. If you need help quitting, ask your health care provider.  Keep all follow-up visits as directed by your health care provider. This is important. Contact a health care provider if:  You have new symptoms.  Your symptoms get worse. Get help right away if:  You have chest pain or shortness of breath, especially during or after sports.  You feel faint or you   pass out.  You have trouble breathing even at rest.  Your feet or ankles swell.  Your heartbeat seems irregular or seems faster than normal (palpitations).  Your heartbeat seems abnormal. This information is not intended to replace advice given to you by your health care provider. Make sure you discuss any questions you have with your  health care provider. Document Released: 04/28/2004 Document Revised: 04/15/2016 Document Reviewed: 04/15/2016 Elsevier Interactive Patient Education  2018 [employer]  

## 2017-08-09 ENCOUNTER — Ambulatory Visit: Payer: BC Managed Care – PPO | Admitting: Cardiovascular Disease

## 2017-08-09 ENCOUNTER — Encounter: Payer: Self-pay | Admitting: Cardiovascular Disease

## 2017-08-09 VITALS — BP 122/72 | HR 88 | Ht 61.0 in | Wt 104.0 lb

## 2017-08-09 DIAGNOSIS — I517 Cardiomegaly: Secondary | ICD-10-CM | POA: Diagnosis not present

## 2017-08-09 DIAGNOSIS — R0789 Other chest pain: Secondary | ICD-10-CM | POA: Diagnosis not present

## 2017-08-09 NOTE — Patient Instructions (Addendum)
Medication Instructions:  Your physician recommends that you continue on your current medications as directed. Please refer to the Current Medication list given to you today.  Labwork: NONE  Testing/Procedures: Your physician has requested that you have an echocardiogram. Echocardiography is a painless test that uses sound waves to create images of your heart. It provides your doctor with information about the size and shape of your heart and how well your heart's chambers and valves are working. This procedure takes approximately one hour. There are no restrictions for this procedure.  Your physician has requested that you have an exercise tolerance test. For further information please visit [email or web address] Please also follow instruction sheet, as given.  Follow-Up: Your physician wants you to follow-up as needed Dr. Eden Emms.    If you need a refill on your cardiac medications before your next appointment, please call your pharmacy.

## 2017-08-09 NOTE — Assessment & Plan Note (Signed)
Cough persists intermittently.  She does note that inhaler seem to help some.  She had been started on gabapentin by ENT so she took it for a short time but she felt it caused excessive sedation so she stopped it.  She is interested in restarting a low-dose at bedtime so prescription is given today and she will try to take it for the next couple of months to confirm response.

## 2017-08-09 NOTE — Assessment & Plan Note (Signed)
Struggles with daily pain but continues to work full time. She gets modest relief with some Hydrocodone use so may continue meds. UDS and contract UTD

## 2017-08-09 NOTE — Progress Notes (Signed)
Cardiology Office Note   Date:  08/09/2017   ID:  Jamie Cox, DOB 06/29/1960, MRN [id number]  PCP:  [name], MD  Cardiologist:   Charlton Haws, MD   No chief complaint on file.     History of Present Illness: Jamie Cox is a 57 y.o. female who presents for consultation regarding chest pain Referred by primary Dr Abner Greenspan.  History of chronic cough sinus surgery 2014 , asthma , bronchiectasis Had Atypical chest pain 2017 with normal ETT Seen at urgent care recently with atypical right sided pain and worse cough. Bronchitis. CXR ? Cardiomegaly. Pain resolved after coughing improved Typically does not get SSCP when not coughing. Mild exertional dyspnea. No palpitations or syncope. Compliant with meds.     Past Medical History:  Diagnosis Date  . Allergy   . Asthma   . Atypical chest pain   . Chronic sinusitis    Followed by Dr. Jenne Pane  . Environmental allergies   . GERD (gastroesophageal reflux disease)   . 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 History of chicken pox   . Hypercalcemia 07/19/2015  . Osteoporosis, unspecified    steroid induced  . Personal history of other diseases of circulatory system   . Rheumatoid arthritis(714.0)   . Unspecified deficiency anemia    microcytic  . Urticaria     Past Surgical History:  Procedure Laterality Date  . NASAL SINUS SURGERY  06/2012     Current Outpatient Medications  Medication Sig Dispense Refill  . acetaminophen (TYLENOL) 650 MG CR tablet Take 650 mg by mouth every 8 (eight) hours as needed. For pain    . albuterol (PROVENTIL HFA;VENTOLIN HFA) 108 (90 Base) MCG/ACT inhaler Inhale 1-2 puffs into the lungs every 6 (six) hours as needed for wheezing or shortness of breath (cough). For shortness of breath/wheezing 3.7 g 1  . budesonide-formoterol (SYMBICORT) 80-4.5 MCG/ACT inhaler Inhale 2 puffs into the lungs 2 (two) times daily. 1 Inhaler 3  . calcium-vitamin D (CALCIUM 500/D) 500-200 MG-UNIT per  tablet 1 tablet.    . Ferrous Fumarate-Folic Acid (HEMOCYTE-F) 324-1 MG TABS Take 1 tablet by mouth daily. 30 each 5  . fexofenadine (ALLEGRA) 180 MG tablet Take 180 mg by mouth daily.    . fluticasone (FLONASE) 50 MCG/ACT nasal spray USE TWO SPRAY(S) IN EACH NOSTRIL ONCE DAILY 48 g 3  . gabapentin (NEURONTIN) 300 MG capsule Take 1 capsule by mouth 3 (three) times daily.    Marland Kitchen HYDROcodone-acetaminophen (NORCO/VICODIN) 5-325 MG tablet Take 1 tablet by mouth every 6 (six) hours as needed for moderate pain. March 2019 60 tablet 0  . hyoscyamine (LEVSIN, ANASPAZ) 0.125 MG tablet Take 1 tablet (0.125 mg total) by mouth every 4 (four) hours as needed. 30 tablet 0  . meloxicam (MOBIC) 7.5 MG tablet Take 1-2 tablets by mouth daily as needed for pain. 60 tablet 3  . methotrexate 2.5 MG tablet     . metoprolol succinate (TOPROL-XL) 25 MG 24 hr tablet TAKE 1 TABLET BY MOUTH EVERY DAY 30 tablet 0  . montelukast (SINGULAIR) 10 MG tablet TAKE 1 TABLET BY MOUTH AT BEDTIME 30 tablet 5  . Multiple Vitamin (MULTIVITAMIN) capsule Take 1 capsule by mouth daily.      . pantoprazole (PROTONIX) 40 MG tablet Take 1 tablet (40 mg total) by mouth daily. 90 tablet 1  . predniSONE (DELTASONE) 2.5 MG tablet TAKE ONE TABLET BY MOUTH ONCE DAILY WITH  BREAKFAST 90 tablet 1  .  ranitidine (ZANTAC) 300 MG tablet Take 1 tablet (300 mg total) at bedtime by mouth. 90 tablet 1  . triamcinolone ointment (KENALOG) 0.1 % Apply 1 application topically 2 (two) times daily. 30 g 5   No current facility-administered medications for this visit.     Allergies:   Avelox [moxifloxacin hcl in nacl]; Bactrim [sulfamethoxazole-trimethoprim]; and Sulfamethoxazole-trimethoprim    Social History:  The patient  reports that  has never smoked. she has never used smokeless tobacco. She reports that she does not drink alcohol or use drugs.   Family History:  The patient's family history includes Alzheimer's disease in her paternal grandfather;  Arthritis in her cousin, daughter, father, and sister; Cancer in her father; Coronary artery disease in her paternal grandmother; Diabetes in her father; Heart attack in her maternal grandmother; Heart disease in her father, mother, and sister; Hyperlipidemia in her son; Hypertension in her maternal grandmother and paternal grandmother; Kidney disease in her mother; Liver cancer in her maternal grandmother and paternal grandmother; Lung cancer in her paternal uncle; Lupus in her cousin; Other in her sister; Prostate cancer in her father; Scleroderma in her mother; Sleep apnea in her brother.    ROS:  Please see the history of present illness.   Otherwise, review of systems are positive for none.   All other systems are reviewed and negative.    PHYSICAL EXAM: VS:  BP 122/72   Pulse 88   Ht 5\' 1"  (1.549 m)   Wt 104 lb (47.2 kg)   BMI 19.65 kg/m  , BMI Body mass index is 19.65 kg/m. Affect appropriate Healthy:  appears stated age HEENT: normal Neck supple with no adenopathy JVP normal no bruits no thyromegaly Lungs clear with no wheezing and good diaphragmatic motion Heart:  S1/S2 no murmur, no rub, gallop or click PMI normal Abdomen: benighn, BS positve, no tenderness, no AAA no bruit.  No HSM or HJR Distal pulses intact with no bruits No edema Neuro non-focal Skin warm and dry No muscular weakness    EKG:  06/11/17 SR rate 78 normal ECG    Recent Labs: 05/08/2017: ALT 19; BUN 13; Creatinine, Ser 0.67; Hemoglobin 11.2; Platelets 260.0; Potassium 4.9; Sodium 140; TSH 0.40    Lipid Panel    Component Value Date/Time   CHOL 190 07/13/2015 1602   TRIG 111.0 07/13/2015 1602   HDL 73.90 07/13/2015 1602   CHOLHDL 3 07/13/2015 1602   VLDL 22.2 07/13/2015 1602   LDLCALC 94 07/13/2015 1602      Wt Readings from Last 3 Encounters:  08/09/17 104 lb (47.2 kg)  08/08/17 104 lb (47.2 kg)  07/17/17 105 lb (47.6 kg)      Other studies Reviewed: Additional studies/ records that  were reviewed today include: Notes from urgent care, primary CXR ECG labs and ETT from 2017.    ASSESSMENT AND PLAN:  1.  Chest Pain: atypical related to cough ETT normal 2017 will repeat to r/o CAD 2. Cardiomegaly: with exertional dyspnea Echo to assess RV/LV size and function 3. Pulmonary: major issue with asthma, bronchiectasis and chronic bronchitic cough Continue inhalers f/u Dr 2018    Current medicines are reviewed at length with the patient today.  The patient does not have concerns regarding medicines.  The following changes have been made:  no change  Labs/ tests ordered today include: ETT/Echo  No orders of the defined types were placed in this encounter.    Disposition:   FU with cardiology PRN  Signed, Charlton Haws, MD  08/09/2017 10:43 AM    [facility] [address] [place], [place], [place]  [address] Phone: [phone]; Fax: [phone]

## 2017-08-09 NOTE — Assessment & Plan Note (Signed)
Patient presented to an urgent care with what she describes is episodes of sharp chest pain about a month ago.  She had several episodes without associated symptoms.  She reports workup there was unremarkable except that chest x-ray confirmed some cardiomegaly.  We will request records.  Due to her risk factors we will also refer her to cardiology for further consideration at this time.

## 2017-08-09 NOTE — Assessment & Plan Note (Signed)
Encouraged daily Ranitidine. Avoid offending foods, start probiotics. Do not eat large meals in late evening and consider raising head of bed.

## 2017-08-14 ENCOUNTER — Encounter: Payer: BC Managed Care – PPO | Admitting: Adult Health

## 2017-08-20 ENCOUNTER — Other Ambulatory Visit: Payer: Self-pay | Admitting: Internal Medicine

## 2017-08-22 ENCOUNTER — Ambulatory Visit (HOSPITAL_COMMUNITY): Payer: BC Managed Care – PPO | Attending: Cardiology

## 2017-08-22 ENCOUNTER — Ambulatory Visit (INDEPENDENT_AMBULATORY_CARE_PROVIDER_SITE_OTHER): Payer: BC Managed Care – PPO

## 2017-08-22 ENCOUNTER — Other Ambulatory Visit: Payer: Self-pay

## 2017-08-22 DIAGNOSIS — I517 Cardiomegaly: Secondary | ICD-10-CM

## 2017-08-22 DIAGNOSIS — R0789 Other chest pain: Secondary | ICD-10-CM

## 2017-08-22 DIAGNOSIS — I081 Rheumatic disorders of both mitral and tricuspid valves: Secondary | ICD-10-CM | POA: Insufficient documentation

## 2017-08-22 LAB — EXERCISE TOLERANCE TEST
CHL CUP RESTING HR STRESS: 78 {beats}/min
CHL RATE OF PERCEIVED EXERTION: 16
CSEPED: 6 min
CSEPEDS: 30 s
Estimated workload: 7.7 METS
MPHR: 162 {beats}/min
Peak HR: 160 {beats}/min
Percent HR: 98 %

## 2017-08-25 ENCOUNTER — Encounter: Payer: BC Managed Care – PPO | Admitting: Adult Health

## 2017-08-28 ENCOUNTER — Encounter: Payer: Self-pay | Admitting: Adult Health

## 2017-08-28 ENCOUNTER — Ambulatory Visit (INDEPENDENT_AMBULATORY_CARE_PROVIDER_SITE_OTHER): Payer: BC Managed Care – PPO | Admitting: Adult Health

## 2017-08-28 DIAGNOSIS — J45991 Cough variant asthma: Secondary | ICD-10-CM | POA: Diagnosis not present

## 2017-08-28 DIAGNOSIS — J328 Other chronic sinusitis: Secondary | ICD-10-CM | POA: Diagnosis not present

## 2017-08-28 DIAGNOSIS — J479 Bronchiectasis, uncomplicated: Secondary | ICD-10-CM

## 2017-08-28 NOTE — Patient Instructions (Signed)
May try Gabapentin 300mg  At bedtime  .  Mucinex DM Twice daily  As needed  Cough/congestion .  Use flutter valve with cough .  Follow medication calendar closely and bring to each visit.  Follow up with Dr.  In 3 months and As needed   Please contact office for sooner follow up if symptoms do not improve or worsen or seek emergency care

## 2017-08-28 NOTE — Assessment & Plan Note (Signed)
Cont on current regimen  

## 2017-08-28 NOTE — Progress Notes (Signed)
Chart and office note reviewed in detail  > agree with a/p as outlined    

## 2017-08-28 NOTE — Assessment & Plan Note (Signed)
Try gabapentin at night to see if helps and less sedating side effects.   Plan  Patient Instructions  May try Gabapentin 300mg  At bedtime  .  Mucinex DM Twice daily  As needed  Cough/congestion .  Use flutter valve with cough .  Follow medication calendar closely and bring to each visit.  Follow up with Dr.  In 3 months and As needed   Please contact office for sooner follow up if symptoms do not improve or worsen or seek emergency care

## 2017-08-28 NOTE — Progress Notes (Signed)
@Patient  ID: , female    DOB: [birth year], 59 y.o.   MRN: [id number]  Chief Complaint  Patient presents with  . Follow-up    Bronchiectasis     Referring provider: [id number], MD  HPI: 57 year old female never smoker with rheumatoid arthritis steroid dependent since 2000.  She remains on methotrexate.  She has followed for RA -ILD\bronchiectasis .    08/28/2017 Follow up ; Bronchiectasis /AB/ILD  Patient returns for a one-month follow-up.  Says that her cough and dyspnea are at baseline. Recent chest x-ray showed chronic changes.. Previous high resolution CT chest January 2018 showed progression of widespread bronchiectasis through the lungs bilaterally.  Severe in the lower lobes and right middle lobe.  Says overall breathing is doing  The same. Cough comes and goes. Is on Gabapentin Three times a day  But has not been taking lately because makes her to sleepy. Does feel it may have helped some.    She has chronic sinus issues has been seen by ENT in the past. Remains on allegra and flonase   We reviewed all her medications organize them into a medication calendar with patient education Appears to be taking her medications correctly    Allergies  Allergen Reactions  . Avelox [Moxifloxacin Hcl In Nacl] Diarrhea, Nausea And Vomiting and Other (See Comments)    Dizziness, Cold chills   . Bactrim [Sulfamethoxazole-Trimethoprim] Hives  . Sulfamethoxazole-Trimethoprim Hives    Immunization History  Administered Date(s) Administered  . Influenza Split 04/05/2011  . Influenza Whole 04/10/2008, 05/15/2009, 05/21/2010, 02/05/2012  . Influenza,inj,Quad PF,6+ Mos 08/06/2013, 05/09/2016  . Pneumococcal Polysaccharide-23 07/01/2010  . Td 05/15/2009    Past Medical History:  Diagnosis Date  . Allergy   . Asthma   . Atypical chest pain   . Chronic sinusitis    Followed by Dr. 14/03/2009  . Environmental allergies   . GERD (gastroesophageal reflux disease)   . High  serum parathyroid hormone (PTH) 06/09/2016  . History of chicken pox   . Hypercalcemia 07/19/2015  . Osteoporosis, unspecified    steroid induced  . Personal history of other diseases of circulatory system   . Rheumatoid arthritis(714.0)   . Unspecified deficiency anemia    microcytic  . Urticaria     Tobacco History: Social History   Tobacco Use  Smoking Status Never Smoker  Smokeless Tobacco Never Used   Counseling given: Not Answered   Outpatient Encounter Medications as of 08/28/2017  Medication Sig  . acetaminophen (TYLENOL) 650 MG CR tablet Take 650 mg by mouth every 8 (eight) hours as needed. For pain  . albuterol (PROVENTIL HFA;VENTOLIN HFA) 108 (90 Base) MCG/ACT inhaler Inhale 1-2 puffs into the lungs every 6 (six) hours as needed for wheezing or shortness of breath (cough). For shortness of breath/wheezing  . calcium-vitamin D (CALCIUM 500/D) 500-200 MG-UNIT per tablet 1 tablet.  . Ferrous Fumarate-Folic Acid (HEMOCYTE-F) 324-1 MG TABS Take 1 tablet by mouth daily.  . fexofenadine (ALLEGRA) 180 MG tablet Take 180 mg by mouth daily.  . fluticasone (FLONASE) 50 MCG/ACT nasal spray USE TWO SPRAY(S) IN EACH NOSTRIL ONCE DAILY  . gabapentin (NEURONTIN) 300 MG capsule Take 1 capsule by mouth 3 (three) times daily.  08/30/2017 HYDROcodone-acetaminophen (NORCO/VICODIN) 5-325 MG tablet Take 1 tablet by mouth every 6 (six) hours as needed for moderate pain. March 2019  . hyoscyamine (LEVSIN, ANASPAZ) 0.125 MG tablet Take 1 tablet (0.125 mg total) by mouth every 4 (four) hours as needed.  April 2019  meloxicam (MOBIC) 7.5 MG tablet Take 1-2 tablets by mouth daily as needed for pain.  . methotrexate 2.5 MG tablet   . metoprolol succinate (TOPROL-XL) 25 MG 24 hr tablet TAKE 1 TABLET BY MOUTH EVERY DAY  . montelukast (SINGULAIR) 10 MG tablet TAKE 1 TABLET BY MOUTH AT BEDTIME  . Multiple Vitamin (MULTIVITAMIN) capsule Take 1 capsule by mouth daily.    . pantoprazole (PROTONIX) 40 MG tablet Take 1 tablet  (40 mg total) by mouth daily.  . predniSONE (DELTASONE) 2.5 MG tablet TAKE ONE TABLET BY MOUTH ONCE DAILY WITH  BREAKFAST  . ranitidine (ZANTAC) 300 MG tablet Take 1 tablet (300 mg total) at bedtime by mouth.  . SYMBICORT 80-4.5 MCG/ACT inhaler INHALE 2 PUFFS INTO THE LUNGS TWO TIMES DAILY  . triamcinolone ointment (KENALOG) 0.1 % Apply 1 application topically 2 (two) times daily.   No facility-administered encounter medications on file as of 08/28/2017.      Review of Systems  Constitutional:   No  weight loss, night sweats,  Fevers, chills, fatigue, or  lassitude.  HEENT:   No headaches,  Difficulty swallowing,  Tooth/dental problems, or  Sore throat,                No sneezing, itching, ear ache,  +nasal congestion, post nasal drip,   CV:  No chest pain,  Orthopnea, PND, swelling in lower extremities, anasarca, dizziness, palpitations, syncope.   GI  No heartburn, indigestion, abdominal pain, nausea, vomiting, diarrhea, change in bowel habits, loss of appetite, bloody stools.   Resp:    No chest wall deformity  Skin: no rash or lesions.  GU: no dysuria, change in color of urine, no urgency or frequency.  No flank pain, no hematuria   MS:  No joint pain or swelling.  No decreased range of motion.  No back pain.    Physical Exam  BP 132/68 (BP Location: Left Arm, Cuff Size: Normal)   Pulse (!) 103   Ht 5\' 1"  (1.549 m)   Wt 103 lb (46.7 kg)   SpO2 100%   BMI 19.46 kg/m   GEN: A/Ox3; pleasant , NAD,    HEENT:  [place]/AT,  EACs-clear, TMs-wnl, NOSE-clear, THROAT-clear, no lesions, no postnasal drip or exudate noted.   NECK:  Supple w/ fair ROM; no JVD; normal carotid impulses w/o bruits; no thyromegaly or nodules palpated; no lymphadenopathy.    RESP  Few trace rhonchi , no accessory muscle use, no dullness to percussion  CARD:  RRR, no m/r/g, no peripheral edema, pulses intact, no cyanosis or clubbing.  GI:   Soft & nt; nml bowel sounds; no organomegaly or masses  detected.   Musco: Warm bil, no deformities or joint swelling noted.   Neuro: alert, no focal deficits noted.    Skin: Warm, no lesions or rashes    Lab Results:  CBC   BNP No results found for: BNP  ProBNP No results found for: PROBNP  Imaging: No results found.   Assessment & Plan:   Bronchiectasis without acute exacerbation (HCC) Stable  Patient's medications were reviewed today and patient education was given. Computerized medication calendar was adjusted/completed   Plan . Patient Instructions  May try Gabapentin 300mg  At bedtime  .  Mucinex DM Twice daily  As needed  Cough/congestion .  Use flutter valve with cough .  Follow medication calendar closely and bring to each visit.  Follow up with Dr. Sherene Sires  In 3 months and As needed  Please contact office for sooner follow up if symptoms do not improve or worsen or seek emergency care        Chronic sinusitis Cont on current regimen .   Cough variant asthma with possible UACS component  Try gabapentin at night to see if helps and less sedating side effects.   Plan  Patient Instructions  May try Gabapentin 300mg  At bedtime  .  Mucinex DM Twice daily  As needed  Cough/congestion .  Use flutter valve with cough .  Follow medication calendar closely and bring to each visit.  Follow up with Dr.  In 3 months and As needed   Please contact office for sooner follow up if symptoms do not improve or worsen or seek emergency care           Sherene Sires, NP 08/28/2017

## 2017-08-28 NOTE — Assessment & Plan Note (Signed)
Stable  Patient's medications were reviewed today and patient education was given. Computerized medication calendar was adjusted/completed   Plan . Patient Instructions  May try Gabapentin 300mg  At bedtime  .  Mucinex DM Twice daily  As needed  Cough/congestion .  Use flutter valve with cough .  Follow medication calendar closely and bring to each visit.  Follow up with Dr.  In 3 months and As needed   Please contact office for sooner follow up if symptoms do not improve or worsen or seek emergency care

## 2017-08-29 NOTE — Addendum Note (Signed)
Addended by: Etheleen Mayhew C on: 08/29/2017 12:20 PM   Modules accepted: Orders

## 2017-09-28 ENCOUNTER — Ambulatory Visit: Payer: BC Managed Care – PPO | Admitting: Podiatry

## 2017-10-06 ENCOUNTER — Ambulatory Visit: Payer: BC Managed Care – PPO | Admitting: Family Medicine

## 2017-10-06 VITALS — BP 133/74 | HR 77 | Temp 98.1°F | Resp 16 | Ht 61.02 in | Wt 102.0 lb

## 2017-10-06 DIAGNOSIS — R000 Tachycardia, unspecified: Secondary | ICD-10-CM | POA: Diagnosis not present

## 2017-10-06 DIAGNOSIS — K219 Gastro-esophageal reflux disease without esophagitis: Secondary | ICD-10-CM

## 2017-10-06 DIAGNOSIS — R053 Chronic cough: Secondary | ICD-10-CM

## 2017-10-06 DIAGNOSIS — D649 Anemia, unspecified: Secondary | ICD-10-CM | POA: Diagnosis not present

## 2017-10-06 DIAGNOSIS — R05 Cough: Secondary | ICD-10-CM | POA: Diagnosis not present

## 2017-10-06 DIAGNOSIS — M069 Rheumatoid arthritis, unspecified: Secondary | ICD-10-CM

## 2017-10-06 LAB — CBC WITH DIFFERENTIAL/PLATELET
Basophils Absolute: 0 10*3/uL (ref 0.0–0.1)
Basophils Relative: 0.9 % (ref 0.0–3.0)
EOS PCT: 1.2 % (ref 0.0–5.0)
Eosinophils Absolute: 0.1 10*3/uL (ref 0.0–0.7)
HEMATOCRIT: 33.4 % — AB (ref 36.0–46.0)
HEMOGLOBIN: 10.5 g/dL — AB (ref 12.0–15.0)
Lymphocytes Relative: 27.1 % (ref 12.0–46.0)
Lymphs Abs: 1.5 10*3/uL (ref 0.7–4.0)
MCHC: 31.5 g/dL (ref 30.0–36.0)
MCV: 73.4 fl — ABNORMAL LOW (ref 78.0–100.0)
MONOS PCT: 7.8 % (ref 3.0–12.0)
Monocytes Absolute: 0.4 10*3/uL (ref 0.1–1.0)
Neutro Abs: 3.5 10*3/uL (ref 1.4–7.7)
Neutrophils Relative %: 63 % (ref 43.0–77.0)
Platelets: 181 10*3/uL (ref 150.0–400.0)
RBC: 4.55 Mil/uL (ref 3.87–5.11)
RDW: 15.5 % (ref 11.5–15.5)
WBC: 5.5 10*3/uL (ref 4.0–10.5)

## 2017-10-06 MED ORDER — HYDROCODONE-ACETAMINOPHEN 5-325 MG PO TABS: 1.0000 | ORAL_TABLET | Freq: Four times a day (QID) | ORAL | 0 refills | Status: DC | PRN

## 2017-10-06 NOTE — Patient Instructions (Addendum)
[place] tea can help a cough, ginger and/or elderberry to it Anemia Anemia is a condition in which you do not have enough red blood cells or hemoglobin. Hemoglobin is a substance in red blood cells that carries oxygen. When you do not have enough red blood cells or hemoglobin (are anemic), your body cannot get enough oxygen and your organs may not work properly. As a result, you may feel very tired or have other problems. What are the causes? Common causes of anemia include:  Excessive bleeding. Anemia can be caused by excessive bleeding inside or outside the body, including bleeding from the intestine or from periods in women.  Poor nutrition.  Long-lasting (chronic) kidney, thyroid, and liver disease.  Bone marrow disorders.  Cancer and treatments for cancer.  HIV (human immunodeficiency virus) and AIDS (acquired immunodeficiency syndrome).  Treatments for HIV and AIDS.  Spleen problems.  Blood disorders.  Infections, medicines, and autoimmune disorders that destroy red blood cells.  What are the signs or symptoms? Symptoms of this condition include:  Minor weakness.  Dizziness.  Headache.  Feeling heartbeats that are irregular or faster than normal (palpitations).  Shortness of breath, especially with exercise.  Paleness.  Cold sensitivity.  Indigestion.  Nausea.  Difficulty sleeping.  Difficulty concentrating.  Symptoms may occur suddenly or develop slowly. If your anemia is mild, you may not have symptoms. How is this diagnosed? This condition is diagnosed based on:  Blood tests.  Your medical history.  A physical exam.  Bone marrow biopsy.  Your health care provider may also check your stool (feces) for blood and may do additional testing to look for the cause of your bleeding. You may also have other tests, including:  Imaging tests, such as a CT scan or MRI.  Endoscopy.  Colonoscopy.  How is this treated? Treatment for this  condition depends on the cause. If you continue to lose a lot of blood, you may need to be treated at a hospital. Treatment may include:  Taking supplements of iron, vitamin N05, or folic acid.  Taking a hormone medicine (erythropoietin) that can help to stimulate red blood cell growth.  Having a blood transfusion. This may be needed if you lose a lot of blood.  Making changes to your diet.  Having surgery to remove your spleen.  Follow these instructions at home:  Take over-the-counter and prescription medicines only as told by your health care provider.  Take supplements only as told by your health care provider.  Follow any diet instructions that you were given.  Keep all follow-up visits as told by your health care provider. This is important. Contact a health care provider if:  You develop new bleeding anywhere in the body. Get help right away if:  You are very weak.  You are short of breath.  You have pain in your abdomen or chest.  You are dizzy or feel faint.  You have trouble concentrating.  You have bloody or black, tarry stools.  You vomit repeatedly or you vomit up blood. Summary  Anemia is a condition in which you do not have enough red blood cells or enough of a substance in your red blood cells that carries oxygen (hemoglobin).  Symptoms may occur suddenly or develop slowly.  If your anemia is mild, you may not have symptoms.  This condition is diagnosed with blood tests as well as a medical history and physical exam. Other tests may be needed.  Treatment for this condition depends on the  cause of the anemia. This information is not intended to replace advice given to you by your health care provider. Make sure you discuss any questions you have with your health care provider. Document Released: 06/30/2004 Document Revised: 06/24/2016 Document Reviewed: 06/24/2016 Elsevier Interactive Patient Education  [employer].

## 2017-10-06 NOTE — Progress Notes (Signed)
Subjective:  I acted as a Neurosurgeon for [employer] Fuller Song, RMA   Patient ID: Jamie Cox, female    DOB: 10/03/1960, 57 y.o.   MRN: [id number]  Chief Complaint  Patient presents with  . Follow-up    HPI  Patient is in today for follow up visit and she continues to work full time. Some days are more difficult than others. She has daily diffuse pain and still manges to wrk with minimal medications but on some days she is in severe pain. She also continues to have a daily nonproductive cough which can limit her activity as well. No recent febrile illness or hospitalizations. Denies CP/palp/HA/congestion/fevers or GU c/o. Taking meds as prescribed  Patient Care Team: [name], MD as PCP - General (Family Medicine) Mardella Layman, MD as Consulting Physician (Gastroenterology)   Past Medical History:  Diagnosis Date  . Allergy   . Asthma   . Atypical chest pain   . Chronic sinusitis    Followed by Dr. Jenne Pane  . Environmental allergies   . GERD (gastroesophageal reflux disease)   . 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 History of chicken pox   . Hypercalcemia 07/19/2015  . Osteoporosis, unspecified    steroid induced  . Personal history of other diseases of circulatory system   . Rheumatoid arthritis(714.0)   . Unspecified deficiency anemia    microcytic  . Urticaria     Past Surgical History:  Procedure Laterality Date  . NASAL SINUS SURGERY  06/2012    Family History  Problem Relation Age of Onset  . Liver cancer Maternal Grandmother   . Hypertension Maternal Grandmother   . Heart attack Maternal Grandmother   . Scleroderma Mother   . Heart disease Mother   . Kidney disease Mother   . Prostate cancer Father   . Diabetes Father   . Heart disease Father        CHF  . Arthritis Father        s/p hip replacement  . Cancer Father        lung cancer with mets, smoker  . Lung cancer Paternal Uncle   . Coronary artery disease Paternal Grandmother     . Hypertension Paternal Grandmother   . Liver cancer Paternal Grandmother   . Hyperlipidemia Son   . Alzheimer's disease Paternal Grandfather   . Other Sister        Cardiomegaly  . Arthritis Sister   . Heart disease Sister        rare  . Sleep apnea Brother   . Arthritis Daughter   . Lupus Cousin   . Arthritis Cousin   . Colon cancer Neg Hx     Social History   Socioeconomic History  . Marital status: Married    Spouse name: Not on file  . Number of children: 2  . Years of education: Not on file  . Highest education level: Not on file  Occupational History  . Occupation: Data processing manager  Social Needs  . Financial resource strain: Not on file  . Food insecurity:    Worry: Not on file    Inability: Not on file  . Transportation needs:    Medical: Not on file    Non-medical: Not on file  Tobacco Use  . Smoking status: Never Smoker  . Smokeless tobacco: Never Used  Substance and Sexual Activity  . Alcohol use: No    Alcohol/week: 0.0 oz  . Drug use: No  .  Sexual activity: Not Currently  Lifestyle  . Physical activity:    Days per week: Not on file    Minutes per session: Not on file  . Stress: Not on file  Relationships  . Social connections:    Talks on phone: Not on file    Gets together: Not on file    Attends religious service: Not on file    Active member of club or organization: Not on file    Attends meetings of clubs or organizations: Not on file    Relationship status: Not on file  . Intimate partner violence:    Fear of current or ex partner: Not on file    Emotionally abused: Not on file    Physically abused: Not on file    Forced sexual activity: Not on file  Other Topics Concern  . Not on file  Social History Narrative  . Not on file    Outpatient Medications Prior to Visit  Medication Sig Dispense Refill  . albuterol (PROVENTIL HFA;VENTOLIN HFA) 108 (90 Base) MCG/ACT inhaler Inhale 1-2 puffs into the lungs every 6 (six) hours as  needed for wheezing or shortness of breath (cough). For shortness of breath/wheezing 3.7 g 1  . Ferrous Fumarate-Folic Acid (HEMOCYTE-F) 324-1 MG TABS Take 1 tablet by mouth daily. 30 each 5  . fexofenadine (ALLEGRA) 180 MG tablet Take 180 mg by mouth daily.    . fluticasone (FLONASE) 50 MCG/ACT nasal spray USE TWO SPRAY(S) IN EACH NOSTRIL ONCE DAILY 48 g 3  . gabapentin (NEURONTIN) 300 MG capsule Take 1 capsule by mouth 3 (three) times daily.    . meloxicam (MOBIC) 7.5 MG tablet Take 1-2 tablets by mouth daily as needed for pain. 60 tablet 3  . methotrexate 2.5 MG tablet     . metoprolol succinate (TOPROL-XL) 25 MG 24 hr tablet TAKE 1 TABLET BY MOUTH EVERY DAY 30 tablet 0  . montelukast (SINGULAIR) 10 MG tablet TAKE 1 TABLET BY MOUTH AT BEDTIME 30 tablet 5  . pantoprazole (PROTONIX) 40 MG tablet Take 1 tablet (40 mg total) by mouth daily. 90 tablet 1  . predniSONE (DELTASONE) 2.5 MG tablet TAKE ONE TABLET BY MOUTH ONCE DAILY WITH  BREAKFAST 90 tablet 1  . ranitidine (ZANTAC) 300 MG tablet Take 1 tablet (300 mg total) at bedtime by mouth. 90 tablet 1  . SYMBICORT 80-4.5 MCG/ACT inhaler INHALE 2 PUFFS INTO THE LUNGS TWO TIMES DAILY 1 Inhaler 3  . HYDROcodone-acetaminophen (NORCO/VICODIN) 5-325 MG tablet Take 1 tablet by mouth every 6 (six) hours as needed for moderate pain. March 2019 60 tablet 0   No facility-administered medications prior to visit.     Allergies  Allergen Reactions  . Avelox [Moxifloxacin Hcl In Nacl] Diarrhea, Nausea And Vomiting and Other (See Comments)    Dizziness, Cold chills   . Bactrim [Sulfamethoxazole-Trimethoprim] Hives  . Sulfamethoxazole-Trimethoprim Hives    Review of Systems  Constitutional: Positive for malaise/fatigue. Negative for fever.  HENT: Negative for congestion.   Eyes: Negative for blurred vision.  Respiratory: Positive for cough and shortness of breath. Negative for sputum production.   Cardiovascular: Negative for chest pain, palpitations  and leg swelling.  Gastrointestinal: Negative for abdominal pain, blood in stool and nausea.  Genitourinary: Negative for dysuria and frequency.  Musculoskeletal: Positive for back pain, joint pain and myalgias. Negative for falls.  Skin: Negative for rash.  Neurological: Negative for dizziness, loss of consciousness and headaches.  Endo/Heme/Allergies: Negative for environmental allergies.  Psychiatric/Behavioral:  Negative for depression. The patient is not nervous/anxious.        Objective:    Physical Exam  Constitutional: She is oriented to person, place, and time. No distress.  frail  HENT:  Head: Normocephalic and atraumatic.  Eyes: Conjunctivae are normal.  Neck: Neck supple. No thyromegaly present.  Cardiovascular: Normal rate, regular rhythm and normal heart sounds.  No murmur heard. Pulmonary/Chest: Effort normal and breath sounds normal. She has no wheezes.  Abdominal: She exhibits no distension and no mass.  Musculoskeletal: She exhibits no edema.  Lymphadenopathy:    She has no cervical adenopathy.  Neurological: She is alert and oriented to person, place, and time.  Skin: Skin is warm and dry. No rash noted. She is not diaphoretic.  Psychiatric: Judgment normal.    BP 133/74 (BP Location: Left Arm, Patient Position: Sitting, Cuff Size: Normal)   Pulse 77   Temp 98.1 F (36.7 C) (Oral)   Resp 16   Ht 5' 1.02" (1.55 m)   Wt 102 lb (46.3 kg)   SpO2 100%   BMI 19.26 kg/m  Wt Readings from Last 3 Encounters:  10/06/17 102 lb (46.3 kg)  08/28/17 103 lb (46.7 kg)  08/09/17 104 lb (47.2 kg)   BP Readings from Last 3 Encounters:  10/06/17 133/74  08/28/17 132/68  08/09/17 122/72     Immunization History  Administered Date(s) Administered  . Influenza Split 04/05/2011  . Influenza Whole 04/10/2008, 05/15/2009, 05/21/2010, 02/05/2012  . Influenza,inj,Quad PF,6+ Mos 08/06/2013, 05/09/2016  . Pneumococcal Polysaccharide-23 07/01/2010  . Td 05/15/2009     Health Maintenance  Topic Date Due  . MAMMOGRAM  08/06/2017  . INFLUENZA VACCINE  01/04/2018  . PAP SMEAR  03/05/2019  . TETANUS/TDAP  05/16/2019  . COLONOSCOPY  10/16/2022  . Hepatitis C Screening  Completed  . HIV Screening  Completed    Lab Results  Component Value Date   WBC 5.5 10/06/2017   HGB 10.5 (L) 10/06/2017   HCT 33.4 (L) 10/06/2017   PLT 181.0 10/06/2017   GLUCOSE 86 05/08/2017   CHOL 190 07/13/2015   TRIG 111.0 07/13/2015   HDL 73.90 07/13/2015   LDLCALC 94 07/13/2015   ALT 19 05/08/2017   AST 25 05/08/2017   NA 140 05/08/2017   K 4.9 05/08/2017   CL 107 05/08/2017   CREATININE 0.67 05/08/2017   BUN 13 05/08/2017   CO2 26 05/08/2017   TSH 0.40 05/08/2017    Lab Results  Component Value Date   TSH 0.40 05/08/2017   Lab Results  Component Value Date   WBC 5.5 10/06/2017   HGB 10.5 (L) 10/06/2017   HCT 33.4 (L) 10/06/2017   MCV 73.4 (L) 10/06/2017   PLT 181.0 10/06/2017   Lab Results  Component Value Date   NA 140 05/08/2017   K 4.9 05/08/2017   CO2 26 05/08/2017   GLUCOSE 86 05/08/2017   BUN 13 05/08/2017   CREATININE 0.67 05/08/2017   BILITOT 0.2 05/08/2017   ALKPHOS 55 05/08/2017   AST 25 05/08/2017   ALT 19 05/08/2017   PROT 8.0 05/08/2017   ALBUMIN 4.2 05/08/2017   CALCIUM 10.3 05/08/2017   CALCIUM 10.3 05/08/2017   GFR 116.00 05/08/2017   Lab Results  Component Value Date   CHOL 190 07/13/2015   Lab Results  Component Value Date   HDL 73.90 07/13/2015   Lab Results  Component Value Date   LDLCALC 94 07/13/2015   Lab Results  Component Value Date  TRIG 111.0 07/13/2015   Lab Results  Component Value Date   CHOLHDL 3 07/13/2015   No results found for: HGBA1C       Assessment & Plan:   Problem List Items Addressed This Visit    Rheumatoid arthritis (HCC)    She continues to work full time but she struggles with daily pain and some days it is difficult to get her work done. She will need to absent from work on  days that she has severe pain and debility. May continue current meds sparingly, UDS and contract are up to date.       Relevant Medications   HYDROcodone-acetaminophen (NORCO/VICODIN) 5-325 MG tablet   Chronic cough    Continues to follow with pulmonology and has daily symptoms she manages with her current meds most day.       Tachycardia    RRR today      GERD (gastroesophageal reflux disease)    Avoid offending foods, start probiotics. Do not eat large meals in late evening and consider raising head of bed.        Other Visit Diagnoses    Anemia, unspecified type    -  Primary   Relevant Orders   CBC w/Diff (Completed)      I am having [name] maintain her albuterol, methotrexate, metoprolol succinate, ranitidine, montelukast, fexofenadine, Ferrous Fumarate-Folic Acid, gabapentin, predniSONE, meloxicam, fluticasone, pantoprazole, SYMBICORT, and HYDROcodone-acetaminophen.  Meds ordered this encounter  Medications  . HYDROcodone-acetaminophen (NORCO/VICODIN) 5-325 MG tablet    Sig: Take 1 tablet by mouth every 6 (six) hours as needed for moderate pain. March 2019    Dispense:  60 tablet    Refill:  0    CMA served as Neurosurgeon during this visit. History, Physical and Plan performed by medical provider. Documentation and orders reviewed and attested to.  Danise Edge, MD

## 2017-10-08 NOTE — Assessment & Plan Note (Signed)
She continues to work full time but she struggles with daily pain and some days it is difficult to get her work done. She will need to absent from work on days that she has severe pain and debility. May continue current meds sparingly, UDS and contract are up to date.

## 2017-10-08 NOTE — Assessment & Plan Note (Signed)
Continues to follow with pulmonology and has daily symptoms she manages with her current meds most day.

## 2017-10-08 NOTE — Assessment & Plan Note (Signed)
RRR today 

## 2017-10-08 NOTE — Assessment & Plan Note (Signed)
Avoid offending foods, start probiotics. Do not eat large meals in late evening and consider raising head of bed.  

## 2017-10-12 DIAGNOSIS — K625 Hemorrhage of anus and rectum: Secondary | ICD-10-CM

## 2017-11-03 ENCOUNTER — Other Ambulatory Visit: Payer: Self-pay | Admitting: Family Medicine

## 2017-11-29 ENCOUNTER — Other Ambulatory Visit: Payer: Self-pay | Admitting: Family Medicine

## 2017-11-29 DIAGNOSIS — M069 Rheumatoid arthritis, unspecified: Secondary | ICD-10-CM

## 2017-11-29 NOTE — Telephone Encounter (Signed)
Copied from CRM [id number]. Topic: Inquiry >> Nov 29, 2017 11:53 AM Yvonna Alanis wrote: Reason for CRM: Patient called requesting a refill of HYDROcodone-acetaminophen (NORCO/VICODIN) 5-325 MG tablet. Patient did contact her pharmacy. Patient's preferred pharmacy is [facility] [address] [place], [place] - [address] [phone] (Phone)  [phone] (Fax).       Thank You!!!

## 2017-11-30 NOTE — Telephone Encounter (Signed)
LOV 10-06-17 with Dr. Abner Greenspan / Refill request for Norco / Last filled:  Disp Refills Start End   HYDROcodone-acetaminophen (NORCO/VICODIN) 5-325 MG tablet 60 tablet 0 10/06/2017     / Sending to provider for approval

## 2017-12-01 ENCOUNTER — Ambulatory Visit: Payer: BC Managed Care – PPO | Admitting: Internal Medicine

## 2017-12-01 ENCOUNTER — Other Ambulatory Visit: Payer: Self-pay | Admitting: Family Medicine

## 2017-12-01 DIAGNOSIS — M069 Rheumatoid arthritis, unspecified: Secondary | ICD-10-CM

## 2017-12-01 MED ORDER — HYDROCODONE-ACETAMINOPHEN 5-325 MG PO TABS: 1.0000 | ORAL_TABLET | Freq: Four times a day (QID) | ORAL | 0 refills | Status: DC | PRN

## 2017-12-01 NOTE — Telephone Encounter (Signed)
Last hydrocodone RX: 10/06/17, #60 Last OV: 10/06/17 Next OV: 01/08/18 UDS: 05/08/17, low risk. Due 11/07/17 CSC: 05/08/17 CSR: No discrepancies identified

## 2018-01-08 ENCOUNTER — Ambulatory Visit (HOSPITAL_BASED_OUTPATIENT_CLINIC_OR_DEPARTMENT_OTHER)
Admission: RE | Admit: 2018-01-08 | Discharge: 2018-01-08 | Disposition: A | Discharge status: Home / Self Care | Payer: BC Managed Care – PPO | Source: Ambulatory Visit | Attending: Family Medicine | Admitting: Family Medicine

## 2018-01-08 ENCOUNTER — Ambulatory Visit: Payer: BC Managed Care – PPO | Admitting: Family Medicine

## 2018-01-08 VITALS — BP 136/70 | HR 76 | Temp 98.4°F | Resp 18 | Wt 103.8 lb

## 2018-01-08 DIAGNOSIS — R05 Cough: Secondary | ICD-10-CM | POA: Diagnosis not present

## 2018-01-08 DIAGNOSIS — M810 Age-related osteoporosis without current pathological fracture: Secondary | ICD-10-CM

## 2018-01-08 DIAGNOSIS — M25551 Pain in right hip: Secondary | ICD-10-CM | POA: Diagnosis not present

## 2018-01-08 DIAGNOSIS — R7989 Other specified abnormal findings of blood chemistry: Secondary | ICD-10-CM

## 2018-01-08 DIAGNOSIS — M25552 Pain in left hip: Secondary | ICD-10-CM | POA: Diagnosis not present

## 2018-01-08 DIAGNOSIS — Z79899 Other long term (current) drug therapy: Secondary | ICD-10-CM

## 2018-01-08 DIAGNOSIS — K219 Gastro-esophageal reflux disease without esophagitis: Secondary | ICD-10-CM | POA: Diagnosis not present

## 2018-01-08 DIAGNOSIS — R053 Chronic cough: Secondary | ICD-10-CM

## 2018-01-08 DIAGNOSIS — J45991 Cough variant asthma: Secondary | ICD-10-CM

## 2018-01-08 DIAGNOSIS — M545 Low back pain, unspecified: Secondary | ICD-10-CM

## 2018-01-08 DIAGNOSIS — M069 Rheumatoid arthritis, unspecified: Secondary | ICD-10-CM

## 2018-01-08 DIAGNOSIS — J4540 Moderate persistent asthma, uncomplicated: Secondary | ICD-10-CM

## 2018-01-08 DIAGNOSIS — D649 Anemia, unspecified: Secondary | ICD-10-CM | POA: Diagnosis not present

## 2018-01-08 DIAGNOSIS — R000 Tachycardia, unspecified: Secondary | ICD-10-CM

## 2018-01-08 DIAGNOSIS — M79605 Pain in left leg: Secondary | ICD-10-CM

## 2018-01-08 LAB — CBC
HCT: 32.6 % — ABNORMAL LOW (ref 36.0–46.0)
Hemoglobin: 10.4 g/dL — ABNORMAL LOW (ref 12.0–15.0)
MCHC: 31.9 g/dL (ref 30.0–36.0)
MCV: 73.5 fl — AB (ref 78.0–100.0)
PLATELETS: 206 10*3/uL (ref 150.0–400.0)
RBC: 4.43 Mil/uL (ref 3.87–5.11)
RDW: 15.9 % — AB (ref 11.5–15.5)
WBC: 5.3 10*3/uL (ref 4.0–10.5)

## 2018-01-08 LAB — COMPREHENSIVE METABOLIC PANEL
ALT: 11 U/L (ref 0–35)
AST: 15 U/L (ref 0–37)
Albumin: 4 g/dL (ref 3.5–5.2)
Alkaline Phosphatase: 68 U/L (ref 39–117)
BUN: 14 mg/dL (ref 6–23)
CALCIUM: 9.8 mg/dL (ref 8.4–10.5)
CO2: 30 meq/L (ref 19–32)
CREATININE: 0.62 mg/dL (ref 0.40–1.20)
Chloride: 104 mEq/L (ref 96–112)
GFR: 126.57 mL/min (ref >60.00)
GLUCOSE: 92 mg/dL (ref 70–99)
Potassium: 4.2 mEq/L (ref 3.5–5.1)
Sodium: 139 mEq/L (ref 135–145)
Total Bilirubin: 0.2 mg/dL (ref 0.2–1.2)
Total Protein: 7.8 g/dL (ref 6.0–8.3)

## 2018-01-08 LAB — VITAMIN D 25 HYDROXY (VIT D DEFICIENCY, FRACTURES): VITD: 37.02 ng/mL (ref 30.00–100.00)

## 2018-01-08 LAB — TSH: TSH: 0.51 u[IU]/mL (ref 0.35–4.50)

## 2018-01-08 LAB — FERRITIN: Ferritin: 360.4 ng/mL — ABNORMAL HIGH (ref 10.0–291.0)

## 2018-01-08 MED ORDER — PANTOPRAZOLE SODIUM 40 MG PO TBEC: 40.0000 mg | DELAYED_RELEASE_TABLET | Freq: Every day | ORAL | 1 refills | Status: DC

## 2018-01-08 MED ORDER — HYDROCODONE-ACETAMINOPHEN 5-325 MG PO TABS: 1.0000 | ORAL_TABLET | Freq: Four times a day (QID) | ORAL | 0 refills | Status: DC | PRN

## 2018-01-08 MED ORDER — PREDNISONE 2.5 MG PO TABS: ORAL_TABLET | ORAL | 1 refills | Status: DC

## 2018-01-08 MED ORDER — RANITIDINE HCL 300 MG PO TABS: 300.0000 mg | ORAL_TABLET | Freq: Every day | ORAL | 5 refills | Status: DC

## 2018-01-08 MED ORDER — FERROUS FUMARATE-FOLIC ACID 324-1 MG PO TABS: 1.0000 | ORAL_TABLET | Freq: Every day | ORAL | 1 refills | Status: DC

## 2018-01-08 MED ORDER — FLUTICASONE PROPIONATE 50 MCG/ACT NA SUSP: NASAL | 3 refills | Status: DC

## 2018-01-08 NOTE — Assessment & Plan Note (Signed)
Follows with pulmonology and ENT and is flared a bit. Has not seen ENT for awhile. Declines referral back to ENT at this time

## 2018-01-08 NOTE — Assessment & Plan Note (Signed)
RRR today 

## 2018-01-08 NOTE — Patient Instructions (Signed)
Send in stool cards    Anemia Anemia is a condition in which you do not have enough red blood cells or hemoglobin. Hemoglobin is a substance in red blood cells that carries oxygen. When you do not have enough red blood cells or hemoglobin (are anemic), your body cannot get enough oxygen and your organs may not work properly. As a result, you may feel very tired or have other problems. What are the causes? Common causes of anemia include:  Excessive bleeding. Anemia can be caused by excessive bleeding inside or outside the body, including bleeding from the intestine or from periods in women.  Poor nutrition.  Long-lasting (chronic) kidney, thyroid, and liver disease.  Bone marrow disorders.  Cancer and treatments for cancer.  HIV (human immunodeficiency virus) and AIDS (acquired immunodeficiency syndrome).  Treatments for HIV and AIDS.  Spleen problems.  Blood disorders.  Infections, medicines, and autoimmune disorders that destroy red blood cells.  What are the signs or symptoms? Symptoms of this condition include:  Minor weakness.  Dizziness.  Headache.  Feeling heartbeats that are irregular or faster than normal (palpitations).  Shortness of breath, especially with exercise.  Paleness.  Cold sensitivity.  Indigestion.  Nausea.  Difficulty sleeping.  Difficulty concentrating.  Symptoms may occur suddenly or develop slowly. If your anemia is mild, you may not have symptoms. How is this diagnosed? This condition is diagnosed based on:  Blood tests.  Your medical history.  A physical exam.  Bone marrow biopsy.  Your health care provider may also check your stool (feces) for blood and may do additional testing to look for the cause of your bleeding. You may also have other tests, including:  Imaging tests, such as a CT scan or MRI.  Endoscopy.  Colonoscopy.  How is this treated? Treatment for this condition depends on the cause. If you continue  to lose a lot of blood, you may need to be treated at a hospital. Treatment may include:  Taking supplements of iron, vitamin I09, or folic acid.  Taking a hormone medicine (erythropoietin) that can help to stimulate red blood cell growth.  Having a blood transfusion. This may be needed if you lose a lot of blood.  Making changes to your diet.  Having surgery to remove your spleen.  Follow these instructions at home:  Take over-the-counter and prescription medicines only as told by your health care provider.  Take supplements only as told by your health care provider.  Follow any diet instructions that you were given.  Keep all follow-up visits as told by your health care provider. This is important. Contact a health care provider if:  You develop new bleeding anywhere in the body. Get help right away if:  You are very weak.  You are short of breath.  You have pain in your abdomen or chest.  You are dizzy or feel faint.  You have trouble concentrating.  You have bloody or black, tarry stools.  You vomit repeatedly or you vomit up blood. Summary  Anemia is a condition in which you do not have enough red blood cells or enough of a substance in your red blood cells that carries oxygen (hemoglobin).  Symptoms may occur suddenly or develop slowly.  If your anemia is mild, you may not have symptoms.  This condition is diagnosed with blood tests as well as a medical history and physical exam. Other tests may be needed.  Treatment for this condition depends on the cause of the anemia. This  information is not intended to replace advice given to you by your health care provider. Make sure you discuss any questions you have with your health care provider. Document Released: 06/30/2004 Document Revised: 06/24/2016 Document Reviewed: 06/24/2016 Elsevier Interactive Patient Education  [employer].

## 2018-01-08 NOTE — Progress Notes (Signed)
Subjective:  I acted as a Neurosurgeon for Dr. Abner Greenspan. Jamie Cox, [place]  Patient ID: Jamie Cox, female    DOB: 1960/09/24, 57 y.o.   MRN: [id number]  No chief complaint on file.   HPI  Patient is in today for a 3 month follow up and she continues to struggle with chronic cough.  She actually ran out of her steroid this week and has had more trouble with her cough as a result.  She also notes increased in her pain.  Is having a great deal of trouble with her hips at work right greater than left.  No recent fall or injury.  She notes the hips have been flared for about 3 months now she describes them as achy in the majority of the pain is at the lateral aspect of her hips.  It is bad with sitting, driving and with walking at work.  No recent fall or injury.  No radiculopathy.  She does have chronic diffuse pain as well in the feet as well as myalgias but this is stable.  No recent febrile illness or hospitalizations. Denies CP/palp/SOB/HA/congestion/fevers/GI or GU c/o. Taking meds as prescribed  Patient Care Team: [name], MD as PCP - General (Family Medicine) Mardella Layman, MD as Consulting Physician (Gastroenterology)   Past Medical History:  Diagnosis Date  . Allergy   . Asthma   . Atypical chest pain   . Chronic sinusitis    Followed by Dr. Jenne Pane  . Environmental allergies   . GERD (gastroesophageal reflux disease)   . 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 History of chicken pox   . Hypercalcemia 07/19/2015  . Osteoporosis, unspecified    steroid induced  . Personal history of other diseases of circulatory system   . Rheumatoid arthritis(714.0)   . Unspecified deficiency anemia    microcytic  . Urticaria     Past Surgical History:  Procedure Laterality Date  . NASAL SINUS SURGERY  06/2012    Family History  Problem Relation Age of Onset  . Liver cancer Maternal Grandmother   . Hypertension Maternal Grandmother   . Heart attack Maternal Grandmother     . Scleroderma Mother   . Heart disease Mother   . Kidney disease Mother   . Prostate cancer Father   . Diabetes Father   . Heart disease Father        CHF  . Arthritis Father        s/p hip replacement  . Cancer Father        lung cancer with mets, smoker  . Lung cancer Paternal Uncle   . Coronary artery disease Paternal Grandmother   . Hypertension Paternal Grandmother   . Liver cancer Paternal Grandmother   . Hyperlipidemia Son   . Alzheimer's disease Paternal Grandfather   . Other Sister        Cardiomegaly  . Arthritis Sister   . Heart disease Sister        rare  . Sleep apnea Brother   . Arthritis Daughter   . Lupus Cousin   . Arthritis Cousin   . Colon cancer Neg Hx     Social History   Socioeconomic History  . Marital status: Married    Spouse name: Not on file  . Number of children: 2  . Years of education: Not on file  . Highest education level: Not on file  Occupational History  . Occupation: Data processing manager  Social Needs  . Financial  resource strain: Not on file  . Food insecurity:    Worry: Not on file    Inability: Not on file  . Transportation needs:    Medical: Not on file    Non-medical: Not on file  Tobacco Use  . Smoking status: Never Smoker  . Smokeless tobacco: Never Used  Substance and Sexual Activity  . Alcohol use: No    Alcohol/week: 0.0 oz  . Drug use: No  . Sexual activity: Not Currently  Lifestyle  . Physical activity:    Days per week: Not on file    Minutes per session: Not on file  . Stress: Not on file  Relationships  . Social connections:    Talks on phone: Not on file    Gets together: Not on file    Attends religious service: Not on file    Active member of club or organization: Not on file    Attends meetings of clubs or organizations: Not on file    Relationship status: Not on file  . Intimate partner violence:    Fear of current or ex partner: Not on file    Emotionally abused: Not on file    Physically  abused: Not on file    Forced sexual activity: Not on file  Other Topics Concern  . Not on file  Social History Narrative  . Not on file    Outpatient Medications Prior to Visit  Medication Sig Dispense Refill  . albuterol (PROVENTIL HFA;VENTOLIN HFA) 108 (90 Base) MCG/ACT inhaler Inhale 1-2 puffs into the lungs every 6 (six) hours as needed for wheezing or shortness of breath (cough). For shortness of breath/wheezing 3.7 g 1  . fexofenadine (ALLEGRA) 180 MG tablet Take 180 mg by mouth daily.    Marland Kitchen gabapentin (NEURONTIN) 300 MG capsule Take 1 capsule by mouth 3 (three) times daily.    . meloxicam (MOBIC) 7.5 MG tablet Take 1-2 tablets by mouth daily as needed for pain. 60 tablet 3  . methotrexate 2.5 MG tablet     . metoprolol succinate (TOPROL-XL) 25 MG 24 hr tablet TAKE 1 TABLET BY MOUTH ONCE DAILY 30 tablet 5  . montelukast (SINGULAIR) 10 MG tablet TAKE 1 TABLET BY MOUTH AT BEDTIME 30 tablet 5  . SYMBICORT 80-4.5 MCG/ACT inhaler INHALE 2 PUFFS INTO THE LUNGS TWO TIMES DAILY 1 Inhaler 3  . Ferrous Fumarate-Folic Acid (HEMOCYTE-F) 324-1 MG TABS Take 1 tablet by mouth daily. 30 each 5  . fluticasone (FLONASE) 50 MCG/ACT nasal spray USE TWO SPRAY(S) IN EACH NOSTRIL ONCE DAILY 48 g 3  . HYDROcodone-acetaminophen (NORCO/VICODIN) 5-325 MG tablet Take 1 tablet by mouth every 6 (six) hours as needed for moderate pain. March 2019 60 tablet 0  . metoprolol succinate (TOPROL-XL) 25 MG 24 hr tablet TAKE 1 TABLET BY MOUTH EVERY DAY 30 tablet 0  . pantoprazole (PROTONIX) 40 MG tablet Take 1 tablet (40 mg total) by mouth daily. 90 tablet 1  . predniSONE (DELTASONE) 2.5 MG tablet TAKE ONE TABLET BY MOUTH ONCE DAILY WITH  BREAKFAST 90 tablet 1  . ranitidine (ZANTAC) 300 MG tablet Take 1 tablet (300 mg total) at bedtime by mouth. 90 tablet 1  . ranitidine (ZANTAC) 300 MG tablet TAKE 1/2 (ONE-HALF) TABLET BY MOUTH AT BEDTIME 45 tablet 3   No facility-administered medications prior to visit.     Allergies   Allergen Reactions  . Avelox [Moxifloxacin Hcl In Nacl] Diarrhea, Nausea And Vomiting and Other (See Comments)  Dizziness, Cold chills   . Bactrim [Sulfamethoxazole-Trimethoprim] Hives  . Sulfamethoxazole-Trimethoprim Hives    Review of Systems  Constitutional: Positive for malaise/fatigue. Negative for fever.  HENT: Positive for sore throat. Negative for congestion.   Eyes: Negative for blurred vision.  Respiratory: Positive for cough and shortness of breath.   Cardiovascular: Negative for chest pain, palpitations and leg swelling.  Gastrointestinal: Negative for abdominal pain, blood in stool and nausea.  Genitourinary: Negative for dysuria and frequency.  Musculoskeletal: Positive for back pain, joint pain and myalgias. Negative for falls.  Skin: Negative for rash.  Neurological: Negative for dizziness, loss of consciousness and headaches.  Endo/Heme/Allergies: Negative for environmental allergies.  Psychiatric/Behavioral: Negative for depression. The patient is not nervous/anxious.        Objective:    Physical Exam  Constitutional: She is oriented to person, place, and time. She appears well-developed and well-nourished. No distress.  HENT:  Head: Normocephalic and atraumatic.  Nose: Nose normal.  Eyes: Right eye exhibits no discharge. Left eye exhibits no discharge.  Neck: Normal range of motion. Neck supple.  Cardiovascular: Normal rate and regular rhythm.  No murmur heard. Pulmonary/Chest: Effort normal and breath sounds normal.  Prolonged expiration  Abdominal: Soft. Bowel sounds are normal. There is no tenderness.  Musculoskeletal: She exhibits no edema.  Neurological: She is alert and oriented to person, place, and time.  Skin: Skin is warm and dry.  Psychiatric: She has a normal mood and affect.  Nursing note and vitals reviewed.   BP 136/70 (BP Location: Left Arm, Patient Position: Sitting, Cuff Size: Normal)   Pulse 76   Temp 98.4 F (36.9 C) (Oral)    Resp 18   Wt 103 lb 12.8 oz (47.1 kg)   SpO2 (!) 81%   BMI 19.60 kg/m  Wt Readings from Last 3 Encounters:  01/08/18 103 lb 12.8 oz (47.1 kg)  10/06/17 102 lb (46.3 kg)  08/28/17 103 lb (46.7 kg)   BP Readings from Last 3 Encounters:  01/08/18 136/70  10/06/17 133/74  08/28/17 132/68     Immunization History  Administered Date(s) Administered  . Influenza Split 04/05/2011  . Influenza Whole 04/10/2008, 05/15/2009, 05/21/2010, 02/05/2012  . Influenza,inj,Quad PF,6+ Mos 08/06/2013, 05/09/2016  . Pneumococcal Polysaccharide-23 07/01/2010  . Td 05/15/2009    Health Maintenance  Topic Date Due  . MAMMOGRAM  08/06/2017  . INFLUENZA VACCINE  01/04/2018  . PAP SMEAR  03/05/2019  . TETANUS/TDAP  05/16/2019  . COLONOSCOPY  10/16/2022  . Hepatitis C Screening  Completed  . HIV Screening  Completed    Lab Results  Component Value Date   WBC 5.3 01/08/2018   HGB 10.4 (L) 01/08/2018   HCT 32.6 (L) 01/08/2018   PLT 206.0 01/08/2018   GLUCOSE 92 01/08/2018   CHOL 190 07/13/2015   TRIG 111.0 07/13/2015   HDL 73.90 07/13/2015   LDLCALC 94 07/13/2015   ALT 11 01/08/2018   AST 15 01/08/2018   NA 139 01/08/2018   K 4.2 01/08/2018   CL 104 01/08/2018   CREATININE 0.62 01/08/2018   BUN 14 01/08/2018   CO2 30 01/08/2018   TSH 0.51 01/08/2018    Lab Results  Component Value Date   TSH 0.51 01/08/2018   Lab Results  Component Value Date   WBC 5.3 01/08/2018   HGB 10.4 (L) 01/08/2018   HCT 32.6 (L) 01/08/2018   MCV 73.5 (L) 01/08/2018   PLT 206.0 01/08/2018   Lab Results  Component Value Date  NA 139 01/08/2018   K 4.2 01/08/2018   CO2 30 01/08/2018   GLUCOSE 92 01/08/2018   BUN 14 01/08/2018   CREATININE 0.62 01/08/2018   BILITOT 0.2 01/08/2018   ALKPHOS 68 01/08/2018   AST 15 01/08/2018   ALT 11 01/08/2018   PROT 7.8 01/08/2018   ALBUMIN 4.0 01/08/2018   CALCIUM 9.8 01/08/2018   CALCIUM 9.6 01/08/2018   GFR 126.57 01/08/2018   Lab Results  Component  Value Date   CHOL 190 07/13/2015   Lab Results  Component Value Date   HDL 73.90 07/13/2015   Lab Results  Component Value Date   LDLCALC 94 07/13/2015   Lab Results  Component Value Date   TRIG 111.0 07/13/2015   Lab Results  Component Value Date   CHOLHDL 3 07/13/2015   No results found for: HGBA1C       Assessment & Plan:   Problem List Items Addressed This Visit    Anemia    Increase leafy greens, consider increased lean red meat and using cast iron cookware. Continue to monitor, report any concerns. Check labs and ferritin. She never turned in IFOB and agrees to turn in      Relevant Medications   Ferrous Fumarate-Folic Acid (HEMOCYTE-F) 324-1 MG TABS   Other Relevant Orders   CBC (Completed)   Ferritin (Completed)   Rheumatoid arthritis (HCC)    She continues with rheumatology at [facility]. Had missed a couple of months of the MTX due to missing a rheumatology appointment      Relevant Medications   HYDROcodone-acetaminophen (NORCO/VICODIN) 5-325 MG tablet   predniSONE (DELTASONE) 2.5 MG tablet   Osteoporosis    Encouraged to get adequate exercise, calcium and vitamin d intake. Failed Bisphosphanates, agrees to [employer], labs ordered today then proceed with PA      Relevant Orders   Comprehensive metabolic panel (Completed)   VITAMIN D 25 Hydroxy (Vit-D Deficiency, Fractures) (Completed)   Chronic cough    Follows with pulmonology and ENT and is flared a bit. Has not seen ENT for awhile. Declines referral back to ENT at this time      Relevant Medications   fluticasone (FLONASE) 50 MCG/ACT nasal spray   Tachycardia    RRR today      GERD (gastroesophageal reflux disease)   Relevant Medications   pantoprazole (PROTONIX) 40 MG tablet   ranitidine (ZANTAC) 300 MG tablet   Other Relevant Orders   CBC (Completed)   High serum parathyroid hormone (PTH)    WNL at last check. Check today      Relevant Orders   TSH (Completed)   PTH, Intact and Calcium  (Completed)   Asthma    Has been off of Prednisone recently about a week and the cough, SOB and pains are all worse. 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 I am also having her start on ranitidine. Additionally, I am having her maintain her albuterol, methotrexate, fexofenadine, gabapentin, meloxicam, SYMBICORT, metoprolol succinate, montelukast, fluticasone, pantoprazole, HYDROcodone-acetaminophen, Ferrous Fumarate-Folic Acid, and predniSONE.  Meds ordered this encounter  Medications  . fluticasone (FLONASE) 50 MCG/ACT nasal spray    Sig: USE TWO SPRAY(S) IN EACH NOSTRIL ONCE DAILY    Dispense:  48 g    Refill:  3    Please consider 90 day supplies to promote better adherence  . pantoprazole (PROTONIX) 40 MG tablet    Sig: Take 1 tablet (40 mg total) by mouth daily.    Dispense:  90 tablet    Refill:  1  . HYDROcodone-acetaminophen (NORCO/VICODIN) 5-325 MG tablet    Sig: Take 1 tablet by mouth every 6 (six) hours as needed for moderate pain. March 2019    Dispense:  60 tablet    Refill:  0  . Ferrous Fumarate-Folic Acid (HEMOCYTE-F) 324-1 MG TABS    Sig: Take 1 tablet by mouth daily.    Dispense:  90 each    Refill:  1    Please consider 90 day supplies to promote better adherence  . predniSONE (DELTASONE) 2.5 MG tablet     Sig: TAKE ONE TABLET BY MOUTH ONCE DAILY WITH  BREAKFAST    Dispense:  90 tablet    Refill:  1  . ranitidine (ZANTAC) 300 MG tablet    Sig: Take 1 tablet (300 mg total) by mouth at bedtime.    Dispense:  30 tablet    Refill:  5    CMA served as scribe during this visit. History, Physical and Plan performed by medical provider. Documentation and orders reviewed and attested to.  Danise Edge, MD

## 2018-01-08 NOTE — Assessment & Plan Note (Addendum)
Encouraged to get adequate exercise, calcium and vitamin d intake. Failed Bisphosphanates, agrees to [employer], labs ordered today then proceed with PA

## 2018-01-08 NOTE — Assessment & Plan Note (Signed)
Has been off of Prednisone recently about a week and the cough, SOB and pains are all worse. She is now getting restarted on the meds and then she will follow up with pulmonology

## 2018-01-08 NOTE — Assessment & Plan Note (Signed)
She continues with rheumatology at [facility]. Had missed a couple of months of the MTX due to missing a rheumatology appointment

## 2018-01-08 NOTE — Assessment & Plan Note (Addendum)
Increase leafy greens, consider increased lean red meat and using cast iron cookware. Continue to monitor, report any concerns. Check labs and ferritin. She never turned in IFOB and agrees to turn in

## 2018-01-08 NOTE — Assessment & Plan Note (Signed)
WNL at last check. Check today

## 2018-01-09 LAB — PAIN MGMT, PROFILE 8 W/CONF, U
6 Acetylmorphine: NEGATIVE ng/mL (ref <10)
ALCOHOL METABOLITES: NEGATIVE ng/mL (ref <500)
Amphetamines: NEGATIVE ng/mL (ref <500)
BENZODIAZEPINES: NEGATIVE ng/mL (ref <100)
Buprenorphine, Urine: NEGATIVE ng/mL (ref <5)
COCAINE METABOLITE: NEGATIVE ng/mL (ref <150)
Creatinine: 19.3 mg/dL — ABNORMAL LOW
MARIJUANA METABOLITE: NEGATIVE ng/mL (ref <20)
MDMA: NEGATIVE ng/mL (ref <500)
Opiates: NEGATIVE ng/mL (ref <100)
Oxidant: NEGATIVE ug/mL (ref <200)
Oxycodone: NEGATIVE ng/mL (ref <100)
Specific Gravity: 1.005 (ref >=1.0)
pH: 7.01 (ref 4.5–9.0)

## 2018-01-09 LAB — PTH, INTACT AND CALCIUM
Calcium: 9.6 mg/dL (ref 8.6–10.4)
PTH: 52 pg/mL (ref 14–64)

## 2018-01-15 ENCOUNTER — Telehealth: Payer: Self-pay

## 2018-01-15 NOTE — Telephone Encounter (Signed)
Can we start Prolia PA

## 2018-01-15 NOTE — Telephone Encounter (Signed)
-----   Message from [name], MD sent at 01/08/2018 11:24 AM EDT ----- Patient interested in Prolia, labs ordered today then we need to start the PA (failed bisphosphanate)

## 2018-02-08 ENCOUNTER — Other Ambulatory Visit: Payer: Self-pay | Admitting: Family Medicine

## 2018-02-15 ENCOUNTER — Other Ambulatory Visit: Payer: Self-pay | Admitting: Family Medicine

## 2018-02-15 DIAGNOSIS — M069 Rheumatoid arthritis, unspecified: Secondary | ICD-10-CM

## 2018-02-15 NOTE — Telephone Encounter (Signed)
Copied from CRM [id number]. Topic: Quick Communication - Rx Refill/Question >> Feb 15, 2018  4:53 PM Baldo Daub L wrote: Medication:  HYDROcodone-acetaminophen (NORCO/VICODIN) 5-325 MG tablet chlorpheniramine-HYDROcodone (TUSSIONEX PENNKINETIC ER) 10-8 MG/5ML LQCR  Has the patient contacted their pharmacy? no (Agent: If no, request that the patient contact the pharmacy for the refill.) (Agent: If yes, when and what did the pharmacy advise?)  Preferred Pharmacy (with phone number or street name): [id number] - [place], [place] - [address] AT [address] [phone] (Phone) [phone] (Fax)  Agent: Please be advised that RX refills may take up to 3 business days. We ask that you follow-up with your pharmacy.

## 2018-02-15 NOTE — Telephone Encounter (Signed)
Refill of hydrocodone  LRF 01/08/18  #60  0 refills  LOV 01/08/18 Dr. Rogelia Rohrer    Pt also requesting refill of chlorpheniramine-HYDROcodone   Not on current med list.  Attempted to call pt re: any symptoms prompting request; no answer, no VM set up.    [id number] - [place], [place] - [address] AT [facility] [address]        [phone] (Phone) [phone] (Fax)

## 2018-02-19 MED ORDER — HYDROCODONE-ACETAMINOPHEN 5-325 MG PO TABS: 1.0000 | ORAL_TABLET | Freq: Four times a day (QID) | ORAL | 0 refills | Status: DC | PRN

## 2018-02-19 NOTE — Telephone Encounter (Signed)
Requesting:Norco Contract:yes UDS:01/08/18 low risk next screen 07/11/17 Last OV:01/08/18 Next OV:04/10/18 Last Refill:01/08/18  #[id number] Database:   Please advise

## 2018-03-09 NOTE — Telephone Encounter (Signed)
Attempted to run Prolia benefits via [employer]; determination pending. Will consult with Prolia reps next week.

## 2018-03-11 ENCOUNTER — Other Ambulatory Visit: Payer: Self-pay | Admitting: Family Medicine

## 2018-03-19 NOTE — Telephone Encounter (Signed)
Summary of benefits received via fax; author to review with prolia reps on 10/28.

## 2018-04-10 ENCOUNTER — Ambulatory Visit: Payer: BC Managed Care – PPO | Admitting: Family Medicine

## 2018-04-10 DIAGNOSIS — M069 Rheumatoid arthritis, unspecified: Secondary | ICD-10-CM | POA: Diagnosis not present

## 2018-04-10 DIAGNOSIS — R05 Cough: Secondary | ICD-10-CM

## 2018-04-10 DIAGNOSIS — R053 Chronic cough: Secondary | ICD-10-CM

## 2018-04-10 DIAGNOSIS — Z23 Encounter for immunization: Secondary | ICD-10-CM

## 2018-04-10 DIAGNOSIS — R000 Tachycardia, unspecified: Secondary | ICD-10-CM | POA: Diagnosis not present

## 2018-04-10 DIAGNOSIS — D649 Anemia, unspecified: Secondary | ICD-10-CM | POA: Diagnosis not present

## 2018-04-10 LAB — CBC WITH DIFFERENTIAL/PLATELET
BASOS ABS: 0.1 10*3/uL (ref 0.0–0.1)
BASOS PCT: 2.1 % (ref 0.0–3.0)
EOS PCT: 0.4 % (ref 0.0–5.0)
Eosinophils Absolute: 0 10*3/uL (ref 0.0–0.7)
HCT: 34.4 % — ABNORMAL LOW (ref 36.0–46.0)
Hemoglobin: 10.7 g/dL — ABNORMAL LOW (ref 12.0–15.0)
LYMPHS ABS: 1.3 10*3/uL (ref 0.7–4.0)
LYMPHS PCT: 25.7 % (ref 12.0–46.0)
MCHC: 31.2 g/dL (ref 30.0–36.0)
MCV: 73.6 fl — AB (ref 78.0–100.0)
MONOS PCT: 10.6 % (ref 3.0–12.0)
Monocytes Absolute: 0.5 10*3/uL (ref 0.1–1.0)
NEUTROS ABS: 3 10*3/uL (ref 1.4–7.7)
NEUTROS PCT: 61.2 % (ref 43.0–77.0)
PLATELETS: 205 10*3/uL (ref 150.0–400.0)
RBC: 4.67 Mil/uL (ref 3.87–5.11)
RDW: 15.3 % (ref 11.5–15.5)
WBC: 4.9 10*3/uL (ref 4.0–10.5)

## 2018-04-10 MED ORDER — AMOXICILLIN-POT CLAVULANATE 875-125 MG PO TABS: 1.0000 | ORAL_TABLET | Freq: Two times a day (BID) | ORAL | 0 refills | Status: DC

## 2018-04-10 MED ORDER — HYDROCODONE-HOMATROPINE 5-1.5 MG/5ML PO SYRP: 5.0000 mL | ORAL_SOLUTION | Freq: Three times a day (TID) | ORAL | 0 refills | Status: DC | PRN

## 2018-04-10 MED ORDER — FAMOTIDINE 40 MG PO TABS: 40.0000 mg | ORAL_TABLET | Freq: Every day | ORAL | 5 refills | Status: DC

## 2018-04-10 MED ORDER — HYDROCODONE-ACETAMINOPHEN 5-325 MG PO TABS: 1.0000 | ORAL_TABLET | Freq: Four times a day (QID) | ORAL | 0 refills | Status: DC | PRN

## 2018-04-10 NOTE — Assessment & Plan Note (Addendum)
Worse over past 4 weeks with increased head congestion and sputum production, green and with PND. Notes some possible fevers and trouble keeping her up at night with sob and cough. Started on antibiotic and hydromet prn.

## 2018-04-10 NOTE — Progress Notes (Signed)
Subjective:    Patient ID: Jamie Cox, female    DOB: 08/15/1960, 57 y.o.   MRN: [id number]  No chief complaint on file.   HPI Patient is in today for evaluation of chronic cough.  He has been worse over the last week with sputum production, malaise and myalgias.  Cough is keeping her up at night again.  No recent hospitalization or significant fevers are noted.  She continues to struggle with daily pain and malaise but also continues to work full-time. Denies CP/palp/SOB/HA/congestion/fevers/GI or GU c/o. Taking meds as prescribed  Past Medical History:  Diagnosis Date  . Allergy   . Asthma   . Atypical chest pain   . Chronic sinusitis    Followed by Dr. Jenne Pane  . Environmental allergies   . GERD (gastroesophageal reflux disease)   . 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 History of chicken pox   . Hypercalcemia 07/19/2015  . Osteoporosis, unspecified    steroid induced  . Personal history of other diseases of circulatory system   . Rheumatoid arthritis(714.0)   . Unspecified deficiency anemia    microcytic  . Urticaria     Past Surgical History:  Procedure Laterality Date  . NASAL SINUS SURGERY  06/2012    Family History  Problem Relation Age of Onset  . Liver cancer Maternal Grandmother   . Hypertension Maternal Grandmother   . Heart attack Maternal Grandmother   . Scleroderma Mother   . Heart disease Mother   . Kidney disease Mother   . Prostate cancer Father   . Diabetes Father   . Heart disease Father        CHF  . Arthritis Father        s/p hip replacement  . Cancer Father        lung cancer with mets, smoker  . Lung cancer Paternal Uncle   . Coronary artery disease Paternal Grandmother   . Hypertension Paternal Grandmother   . Liver cancer Paternal Grandmother   . Hyperlipidemia Son   . Alzheimer's disease Paternal Grandfather   . Other Sister        Cardiomegaly  . Arthritis Sister   . Heart disease Sister        rare  . Sleep  apnea Brother   . Arthritis Daughter   . Lupus Cousin   . Arthritis Cousin   . Colon cancer Neg Hx     Social History   Socioeconomic History  . Marital status: Married    Spouse name: Not on file  . Number of children: 2  . Years of education: Not on file  . Highest education level: Not on file  Occupational History  . Occupation: Data processing manager  Social Needs  . Financial resource strain: Not on file  . Food insecurity:    Worry: Not on file    Inability: Not on file  . Transportation needs:    Medical: Not on file    Non-medical: Not on file  Tobacco Use  . Smoking status: Never Smoker  . Smokeless tobacco: Never Used  Substance and Sexual Activity  . Alcohol use: No    Alcohol/week: 0.0 standard drinks  . Drug use: No  . Sexual activity: Not Currently  Lifestyle  . Physical activity:    Days per week: Not on file    Minutes per session: Not on file  . Stress: Not on file  Relationships  . Social connections:    Talks on phone:  Not on file    Gets together: Not on file    Attends religious service: Not on file    Active member of club or organization: Not on file    Attends meetings of clubs or organizations: Not on file    Relationship status: Not on file  . Intimate partner violence:    Fear of current or ex partner: Not on file    Emotionally abused: Not on file    Physically abused: Not on file    Forced sexual activity: Not on file  Other Topics Concern  . Not on file  Social History Narrative  . Not on file    Outpatient Medications Prior to Visit  Medication Sig Dispense Refill  . albuterol (PROVENTIL HFA;VENTOLIN HFA) 108 (90 Base) MCG/ACT inhaler Inhale 1-2 puffs into the lungs every 6 (six) hours as needed for wheezing or shortness of breath (cough). For shortness of breath/wheezing 3.7 g 1  . fexofenadine (ALLEGRA) 180 MG tablet Take 180 mg by mouth daily.    . fluticasone (FLONASE) 50 MCG/ACT nasal spray USE TWO SPRAY(S) IN EACH NOSTRIL  ONCE DAILY 48 g 3  . gabapentin (NEURONTIN) 300 MG capsule Take 1 capsule by mouth 3 (three) times daily.    Marland Kitchen HEMATINIC/FOLIC ACID 324-1 MG TABS TAKE 1 TABLET BY MOUTH EVERY DAY 90 each 1  . meloxicam (MOBIC) 7.5 MG tablet TAKE 1 TO 2 TABLETS BY MOUTH ONCE DAILY AS NEEDED FOR PAIN 60 tablet 3  . methotrexate 2.5 MG tablet     . metoprolol succinate (TOPROL-XL) 25 MG 24 hr tablet TAKE 1 TABLET BY MOUTH ONCE DAILY 30 tablet 5  . montelukast (SINGULAIR) 10 MG tablet TAKE 1 TABLET BY MOUTH AT BEDTIME 30 tablet 5  . pantoprazole (PROTONIX) 40 MG tablet Take 1 tablet (40 mg total) by mouth daily. 90 tablet 1  . predniSONE (DELTASONE) 2.5 MG tablet TAKE ONE TABLET BY MOUTH ONCE DAILY WITH  BREAKFAST 90 tablet 1  . SYMBICORT 80-4.5 MCG/ACT inhaler INHALE 2 PUFFS INTO THE LUNGS TWO TIMES DAILY 1 Inhaler 3  . HYDROcodone-acetaminophen (NORCO/VICODIN) 5-325 MG tablet Take 1 tablet by mouth every 6 (six) hours as needed for moderate pain. March 2019 60 tablet 0  . ranitidine (ZANTAC) 300 MG tablet Take 1 tablet (300 mg total) by mouth at bedtime. 30 tablet 5   No facility-administered medications prior to visit.     Allergies  Allergen Reactions  . Avelox [Moxifloxacin Hcl In Nacl] Diarrhea, Nausea And Vomiting and Other (See Comments)    Dizziness, Cold chills   . Bactrim [Sulfamethoxazole-Trimethoprim] Hives  . Sulfamethoxazole-Trimethoprim Hives    Review of Systems  Constitutional: Negative for fever and malaise/fatigue.  HENT: Positive for congestion and ear pain.   Eyes: Negative for blurred vision.  Respiratory: Positive for cough. Negative for shortness of breath.   Cardiovascular: Negative for chest pain, palpitations and leg swelling.  Gastrointestinal: Negative for abdominal pain, blood in stool and nausea.  Genitourinary: Negative for dysuria and frequency.  Musculoskeletal: Negative for falls.  Skin: Negative for rash.  Neurological: Negative for dizziness, loss of consciousness  and headaches.  Endo/Heme/Allergies: Negative for environmental allergies.  Psychiatric/Behavioral: Negative for depression. The patient is not nervous/anxious.        Objective:    Physical Exam  Constitutional: She is oriented to person, place, and time. She appears well-developed and well-nourished. No distress.  HENT:  Head: Normocephalic and atraumatic.  Nose: Nose normal.  Eyes: Right eye  exhibits no discharge. Left eye exhibits no discharge.  Neck: Normal range of motion. Neck supple.  Cardiovascular: Normal rate and regular rhythm.  No murmur heard. Pulmonary/Chest: Effort normal and breath sounds normal.  Abdominal: Soft. Bowel sounds are normal. There is no tenderness.  Musculoskeletal: She exhibits no edema.  Neurological: She is alert and oriented to person, place, and time.  Skin: Skin is warm and dry.  Psychiatric: She has a normal mood and affect.  Nursing note and vitals reviewed.   BP 132/80 (BP Location: Left Arm, Patient Position: Sitting, Cuff Size: Normal)   Pulse 92   Temp (!) 97.4 F (36.3 C) (Oral)   Resp 18   Wt 105 lb 6.4 oz (47.8 kg)   SpO2 98%   BMI 19.90 kg/m  Wt Readings from Last 3 Encounters:  04/10/18 105 lb 6.4 oz (47.8 kg)  01/08/18 103 lb 12.8 oz (47.1 kg)  10/06/17 102 lb (46.3 kg)     Lab Results  Component Value Date   WBC 4.9 04/10/2018   HGB 10.7 (L) 04/10/2018   HCT 34.4 (L) 04/10/2018   PLT 205.0 04/10/2018   GLUCOSE 92 01/08/2018   CHOL 190 07/13/2015   TRIG 111.0 07/13/2015   HDL 73.90 07/13/2015   LDLCALC 94 07/13/2015   ALT 11 01/08/2018   AST 15 01/08/2018   NA 139 01/08/2018   K 4.2 01/08/2018   CL 104 01/08/2018   CREATININE 0.62 01/08/2018   BUN 14 01/08/2018   CO2 30 01/08/2018   TSH 0.51 01/08/2018    Lab Results  Component Value Date   TSH 0.51 01/08/2018   Lab Results  Component Value Date   WBC 4.9 04/10/2018   HGB 10.7 (L) 04/10/2018   HCT 34.4 (L) 04/10/2018   MCV 73.6 (L) 04/10/2018    PLT 205.0 04/10/2018   Lab Results  Component Value Date   NA 139 01/08/2018   K 4.2 01/08/2018   CO2 30 01/08/2018   GLUCOSE 92 01/08/2018   BUN 14 01/08/2018   CREATININE 0.62 01/08/2018   BILITOT 0.2 01/08/2018   ALKPHOS 68 01/08/2018   AST 15 01/08/2018   ALT 11 01/08/2018   PROT 7.8 01/08/2018   ALBUMIN 4.0 01/08/2018   CALCIUM 9.8 01/08/2018   CALCIUM 9.6 01/08/2018   GFR 126.57 01/08/2018   Lab Results  Component Value Date   CHOL 190 07/13/2015   Lab Results  Component Value Date   HDL 73.90 07/13/2015   Lab Results  Component Value Date   LDLCALC 94 07/13/2015   Lab Results  Component Value Date   TRIG 111.0 07/13/2015   Lab Results  Component Value Date   CHOLHDL 3 07/13/2015   No results found for: HGBA1C     Assessment & Plan:   Problem List Items Addressed This Visit    Anemia    Stable encouraged iron supplements and monitor      Rheumatoid arthritis (HCC)   Relevant Medications   HYDROcodone-acetaminophen (NORCO/VICODIN) 5-325 MG tablet   Chronic cough    Worse over past 4 weeks with increased head congestion and sputum production, green and with PND. Notes some possible fevers and trouble keeping her up at night with sob and cough. Started on antibiotic and hydromet prn.       Relevant Orders   CBC w/Diff (Completed)   Tachycardia    RRR today         I have discontinued Seferina Uballe's ranitidine. I am also  having her start on amoxicillin-clavulanate and famotidine. Additionally, I am having her maintain her albuterol, methotrexate, fexofenadine, gabapentin, SYMBICORT, metoprolol succinate, montelukast, fluticasone, pantoprazole, predniSONE, meloxicam, HEMATINIC/FOLIC ACID, and HYDROcodone-acetaminophen.  Meds ordered this encounter  Medications  . amoxicillin-clavulanate (AUGMENTIN) 875-125 MG tablet    Sig: Take 1 tablet by mouth 2 (two) times daily.    Dispense:  20 tablet    Refill:  0  . DISCONTD: HYDROcodone-homatropine  (HYCODAN) 5-1.5 MG/5ML syrup    Sig: Take 5 mLs by mouth every 8 (eight) hours as needed for cough.    Dispense:  120 mL    Refill:  0  . famotidine (PEPCID) 40 MG tablet    Sig: Take 1 tablet (40 mg total) by mouth daily.    Dispense:  30 tablet    Refill:  5  . HYDROcodone-acetaminophen (NORCO/VICODIN) 5-325 MG tablet    Sig: Take 1 tablet by mouth every 6 (six) hours as needed for moderate pain. March 2019    Dispense:  60 tablet    Refill:  0     Danise Edge, MD

## 2018-04-10 NOTE — Patient Instructions (Signed)

## 2018-04-11 ENCOUNTER — Other Ambulatory Visit: Payer: Self-pay | Admitting: Family Medicine

## 2018-04-11 ENCOUNTER — Telehealth: Payer: Self-pay | Admitting: Family Medicine

## 2018-04-11 MED ORDER — HYDROCODONE-HOMATROPINE 5-1.5 MG/5ML PO SYRP: 5.0000 mL | ORAL_SOLUTION | Freq: Three times a day (TID) | ORAL | 0 refills | Status: DC | PRN

## 2018-04-11 NOTE — Telephone Encounter (Addendum)
error 

## 2018-04-11 NOTE — Telephone Encounter (Signed)
Copied from CRM [id number]. Topic: Quick Communication - Rx Refill/Question >> Apr 11, 2018  2:46 PM Crist Infante wrote: Medication: HYDROcodone-homatropine (HYCODAN) 5-1.5 MG/5ML syrup  Pt states the [facility] is out of stock this cough med. Pt requesting resend to  [id number] - [place], [place] - [address] AT [facility] [address] [phone] (Phone) [phone] (Fax)

## 2018-04-11 NOTE — Telephone Encounter (Signed)
Can you send to other pharmacy list below?

## 2018-04-12 NOTE — Assessment & Plan Note (Signed)
Stable encouraged iron supplements and monitor

## 2018-04-12 NOTE — Assessment & Plan Note (Signed)
RRR today 

## 2018-04-16 NOTE — Telephone Encounter (Signed)
Author received fax for refill request on hydrocod/hom 5-1.5mg /88mL from [facility]. Order already sent by Dr. Abner Greenspan on 11/6 to [facility].

## 2018-04-25 ENCOUNTER — Ambulatory Visit: Payer: Self-pay

## 2018-04-25 NOTE — Telephone Encounter (Signed)
Returned call to patient who states she has a bad cough. She states she has been battling the cough for about a month now.  She states she always has a cough but sometimes it just gets bad. She has taken antibiotic and was improving but the cough seems to be returning. The sputum is beige in color. She denies blood, but states she has some blood when she blows her nose. She states that her chest is sore from coughing and that the cough is keeping her up at night. She denies fever but states she takes tylenol for arthritis pain daily. Appointment scheduled per protocol.  Care advice read to patient. Pt verbalized understanding of all instructions.  Reason for Disposition . [1] Continuous (nonstop) coughing interferes with work or school AND [2] no improvement using cough treatment per Care Advice  Answer Assessment - Initial Assessment Questions 1. ONSET: "When did the cough begin?"      Month ago 2. SEVERITY: "How bad is the cough today?"      yes 3. RESPIRATORY DISTRESS: "Describe your breathing."     No 4. FEVER: "Do you have a fever?" If so, ask: "What is your temperature, how was it measured, and when did it start?"     No takes tylenol for arthritis 5. SPUTUM: "Describe the color of your sputum" (clear, white, yellow, green)     beige 6. HEMOPTYSIS: "Are you coughing up any blood?" If so ask: "How much?" (flecks, streaks, tablespoons, etc.)     No but when she blows her nose she see blood 7. CARDIAC HISTORY: "Do you have any history of heart disease?" (e.g., heart attack, congestive heart failure)      no 8. LUNG HISTORY: "Do you have any history of lung disease?"  (e.g., pulmonary embolus, asthma, emphysema)     Asthma uses inhaler  9. PE RISK FACTORS: "Do you have a history of blood clots?" (or: recent major surgery, recent prolonged travel, bedridden)     no 10. OTHER SYMPTOMS: "Do you have any other symptoms?" (e.g., runny nose, wheezing, chest pain)       Chest is sore from  cough 11. PREGNANCY: "Is there any chance you are pregnant?" "When was your last menstrual period?"       No  12. TRAVEL: "Have you traveled out of the country in the last month?" (e.g., travel history, exposures)       no  Protocols used: COUGH - ACUTE PRODUCTIVE-A-AH

## 2018-04-26 ENCOUNTER — Ambulatory Visit (INDEPENDENT_AMBULATORY_CARE_PROVIDER_SITE_OTHER): Payer: BC Managed Care – PPO | Admitting: Family Medicine

## 2018-04-26 ENCOUNTER — Ambulatory Visit (HOSPITAL_BASED_OUTPATIENT_CLINIC_OR_DEPARTMENT_OTHER)
Admission: RE | Admit: 2018-04-26 | Discharge: 2018-04-26 | Disposition: A | Discharge status: Home / Self Care | Payer: BC Managed Care – PPO | Source: Ambulatory Visit | Attending: Family Medicine | Admitting: Family Medicine

## 2018-04-26 ENCOUNTER — Encounter: Payer: Self-pay | Admitting: Family Medicine

## 2018-04-26 VITALS — BP 98/60 | HR 89 | Temp 98.3°F | Resp 18 | Wt 104.8 lb

## 2018-04-26 DIAGNOSIS — R6883 Chills (without fever): Secondary | ICD-10-CM

## 2018-04-26 DIAGNOSIS — M069 Rheumatoid arthritis, unspecified: Secondary | ICD-10-CM

## 2018-04-26 DIAGNOSIS — R053 Chronic cough: Secondary | ICD-10-CM

## 2018-04-26 DIAGNOSIS — M0510 Rheumatoid lung disease with rheumatoid arthritis of unspecified site: Secondary | ICD-10-CM

## 2018-04-26 DIAGNOSIS — R05 Cough: Secondary | ICD-10-CM

## 2018-04-26 DIAGNOSIS — J479 Bronchiectasis, uncomplicated: Secondary | ICD-10-CM | POA: Insufficient documentation

## 2018-04-26 MED ORDER — AMOXICILLIN-POT CLAVULANATE 875-125 MG PO TABS: 1.0000 | ORAL_TABLET | Freq: Two times a day (BID) | ORAL | 0 refills | Status: DC

## 2018-04-26 NOTE — Patient Instructions (Signed)
Elderberry syrup and warm tea with lots of lemon and honey   Cough, Adult Coughing is a reflex that clears your throat and your airways. Coughing helps to heal and protect your lungs. It is normal to cough occasionally, but a cough that happens with other symptoms or lasts a long time may be a sign of a condition that needs treatment. A cough may last only 2-3 weeks (acute), or it may last longer than 8 weeks (chronic). What are the causes? Coughing is commonly caused by:  Breathing in substances that irritate your lungs.  A viral or bacterial respiratory infection.  Allergies.  Asthma.  Postnasal drip.  Smoking.  Acid backing up from the stomach into the esophagus (gastroesophageal reflux).  Certain medicines.  Chronic lung problems, including COPD (or rarely, lung cancer).  Other medical conditions such as heart failure.  Follow these instructions at home: Pay attention to any changes in your symptoms. Take these actions to help with your discomfort:  Take medicines only as told by your health care provider. ? If you were prescribed an antibiotic medicine, take it as told by your health care provider. Do not stop taking the antibiotic even if you start to feel better. ? Talk with your health care provider before you take a cough suppressant medicine.  Drink enough fluid to keep your urine clear or pale yellow.  If the air is dry, use a cold steam vaporizer or humidifier in your bedroom or your home to help loosen secretions.  Avoid anything that causes you to cough at work or at home.  If your cough is worse at night, try sleeping in a semi-upright position.  Avoid cigarette smoke. If you smoke, quit smoking. If you need help quitting, ask your health care provider.  Avoid caffeine.  Avoid alcohol.  Rest as needed.  Contact a health care provider if:  You have new symptoms.  You cough up pus.  Your cough does not get better after 2-3 weeks, or your cough  gets worse.  You cannot control your cough with suppressant medicines and you are losing sleep.  You develop pain that is getting worse or pain that is not controlled with pain medicines.  You have a fever.  You have unexplained weight loss.  You have night sweats. Get help right away if:  You cough up blood.  You have difficulty breathing.  Your heartbeat is very fast. This information is not intended to replace advice given to you by your health care provider. Make sure you discuss any questions you have with your health care provider. Document Released: 11/19/2010 Document Revised: 10/29/2015 Document Reviewed: 07/30/2014 Elsevier Interactive Patient Education  [employer].

## 2018-04-29 NOTE — Assessment & Plan Note (Signed)
Continues to follow with daily pain, manages to work full time with current meds prn.

## 2018-04-29 NOTE — Progress Notes (Signed)
Subjective:    Patient ID: Jamie Cox, female    DOB: 1960/11/10, 57 y.o.   MRN: [id number]  No chief complaint on file.   HPI Patient is in today for evaluation of worsening cough. She has a chronic cough but over last week she is noting a return of a severe cough which is keeping her up at night. No fevers but some chills noted. Notes fatigue, malaise, shortness of breat with coughing. Also continues to struggle with daily pain diffusely. Denies CP/palp/HA/fevers/GI or GU c/o. Taking meds as prescribed  Past Medical History:  Diagnosis Date  . Allergy   . Asthma   . Atypical chest pain   . Chronic sinusitis    Followed by Dr. Jenne Pane  . Environmental allergies   . GERD (gastroesophageal reflux disease)   . 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 History of chicken pox   . Hypercalcemia 07/19/2015  . Osteoporosis, unspecified    steroid induced  . Personal history of other diseases of circulatory system   . Rheumatoid arthritis(714.0)   . Unspecified deficiency anemia    microcytic  . Urticaria     Past Surgical History:  Procedure Laterality Date  . NASAL SINUS SURGERY  06/2012    Family History  Problem Relation Age of Onset  . Liver cancer Maternal Grandmother   . Hypertension Maternal Grandmother   . Heart attack Maternal Grandmother   . Scleroderma Mother   . Heart disease Mother   . Kidney disease Mother   . Prostate cancer Father   . Diabetes Father   . Heart disease Father        CHF  . Arthritis Father        s/p hip replacement  . Cancer Father        lung cancer with mets, smoker  . Lung cancer Paternal Uncle   . Coronary artery disease Paternal Grandmother   . Hypertension Paternal Grandmother   . Liver cancer Paternal Grandmother   . Hyperlipidemia Son   . Alzheimer's disease Paternal Grandfather   . Other Sister        Cardiomegaly  . Arthritis Sister   . Heart disease Sister        rare  . Sleep apnea Brother   . Arthritis  Daughter   . Lupus Cousin   . Arthritis Cousin   . Colon cancer Neg Hx     Social History   Socioeconomic History  . Marital status: Married    Spouse name: Not on file  . Number of children: 2  . Years of education: Not on file  . Highest education level: Not on file  Occupational History  . Occupation: Data processing manager  Social Needs  . Financial resource strain: Not on file  . Food insecurity:    Worry: Not on file    Inability: Not on file  . Transportation needs:    Medical: Not on file    Non-medical: Not on file  Tobacco Use  . Smoking status: Never Smoker  . Smokeless tobacco: Never Used  Substance and Sexual Activity  . Alcohol use: No    Alcohol/week: 0.0 standard drinks  . Drug use: No  . Sexual activity: Not Currently  Lifestyle  . Physical activity:    Days per week: Not on file    Minutes per session: Not on file  . Stress: Not on file  Relationships  . Social connections:    Talks on phone: Not on  file    Gets together: Not on file    Attends religious service: Not on file    Active member of club or organization: Not on file    Attends meetings of clubs or organizations: Not on file    Relationship status: Not on file  . Intimate partner violence:    Fear of current or ex partner: Not on file    Emotionally abused: Not on file    Physically abused: Not on file    Forced sexual activity: Not on file  Other Topics Concern  . Not on file  Social History Narrative  . Not on file    Outpatient Medications Prior to Visit  Medication Sig Dispense Refill  . albuterol (PROVENTIL HFA;VENTOLIN HFA) 108 (90 Base) MCG/ACT inhaler Inhale 1-2 puffs into the lungs every 6 (six) hours as needed for wheezing or shortness of breath (cough). For shortness of breath/wheezing 3.7 g 1  . famotidine (PEPCID) 40 MG tablet Take 1 tablet (40 mg total) by mouth daily. 30 tablet 5  . fexofenadine (ALLEGRA) 180 MG tablet Take 180 mg by mouth daily.    . fluticasone  (FLONASE) 50 MCG/ACT nasal spray USE TWO SPRAY(S) IN EACH NOSTRIL ONCE DAILY 48 g 3  . gabapentin (NEURONTIN) 300 MG capsule Take 1 capsule by mouth 3 (three) times daily.    Marland Kitchen HEMATINIC/FOLIC ACID 324-1 MG TABS TAKE 1 TABLET BY MOUTH EVERY DAY 90 each 1  . HYDROcodone-acetaminophen (NORCO/VICODIN) 5-325 MG tablet Take 1 tablet by mouth every 6 (six) hours as needed for moderate pain. March 2019 60 tablet 0  . HYDROcodone-homatropine (HYCODAN) 5-1.5 MG/5ML syrup Take 5 mLs by mouth every 8 (eight) hours as needed for cough. 120 mL 0  . meloxicam (MOBIC) 7.5 MG tablet TAKE 1 TO 2 TABLETS BY MOUTH ONCE DAILY AS NEEDED FOR PAIN 60 tablet 3  . methotrexate 2.5 MG tablet     . metoprolol succinate (TOPROL-XL) 25 MG 24 hr tablet TAKE 1 TABLET BY MOUTH ONCE DAILY 30 tablet 5  . montelukast (SINGULAIR) 10 MG tablet TAKE 1 TABLET BY MOUTH AT BEDTIME 30 tablet 5  . pantoprazole (PROTONIX) 40 MG tablet Take 1 tablet (40 mg total) by mouth daily. 90 tablet 1  . predniSONE (DELTASONE) 2.5 MG tablet TAKE ONE TABLET BY MOUTH ONCE DAILY WITH  BREAKFAST 90 tablet 1  . SYMBICORT 80-4.5 MCG/ACT inhaler INHALE 2 PUFFS INTO THE LUNGS TWO TIMES DAILY 1 Inhaler 3  . amoxicillin-clavulanate (AUGMENTIN) 875-125 MG tablet Take 1 tablet by mouth 2 (two) times daily. (Patient not taking: Reported on 04/26/2018) 20 tablet 0   No facility-administered medications prior to visit.     Allergies  Allergen Reactions  . Avelox [Moxifloxacin Hcl In Nacl] Diarrhea, Nausea And Vomiting and Other (See Comments)    Dizziness, Cold chills   . Bactrim [Sulfamethoxazole-Trimethoprim] Hives  . Sulfamethoxazole-Trimethoprim Hives    Review of Systems  Constitutional: Positive for chills. Negative for fever and malaise/fatigue.  HENT: Negative for congestion.   Eyes: Negative for blurred vision.  Respiratory: Positive for cough and shortness of breath.   Cardiovascular: Negative for chest pain, palpitations and leg swelling.    Gastrointestinal: Negative for abdominal pain, blood in stool and nausea.  Genitourinary: Negative for dysuria and frequency.  Musculoskeletal: Positive for back pain, joint pain and myalgias. Negative for falls.  Skin: Negative for rash.  Neurological: Negative for dizziness, loss of consciousness and headaches.  Endo/Heme/Allergies: Negative for environmental allergies.  Psychiatric/Behavioral:  Negative for depression. The patient is not nervous/anxious.        Objective:    Physical Exam  Constitutional: She is oriented to person, place, and time. She appears well-developed and well-nourished. No distress.  HENT:  Head: Normocephalic and atraumatic.  Nose: Nose normal.  Eyes: Right eye exhibits no discharge. Left eye exhibits no discharge.  Neck: Normal range of motion. Neck supple.  Cardiovascular: Normal rate and regular rhythm.  No murmur heard. Pulmonary/Chest: Effort normal and breath sounds normal.  Abdominal: Soft. Bowel sounds are normal. There is no tenderness.  Musculoskeletal: She exhibits no edema.  Neurological: She is alert and oriented to person, place, and time.  Skin: Skin is warm and dry.  Psychiatric: She has a normal mood and affect.  Nursing note and vitals reviewed.   BP 98/60 (BP Location: Left Arm, Patient Position: Sitting, Cuff Size: Normal)   Pulse 89   Temp 98.3 F (36.8 C) (Oral)   Resp 18   Wt 104 lb 12.8 oz (47.5 kg)   SpO2 97%   BMI 19.79 kg/m  Wt Readings from Last 3 Encounters:  04/26/18 104 lb 12.8 oz (47.5 kg)  04/10/18 105 lb 6.4 oz (47.8 kg)  01/08/18 103 lb 12.8 oz (47.1 kg)     Lab Results  Component Value Date   WBC 4.9 04/10/2018   HGB 10.7 (L) 04/10/2018   HCT 34.4 (L) 04/10/2018   PLT 205.0 04/10/2018   GLUCOSE 92 01/08/2018   CHOL 190 07/13/2015   TRIG 111.0 07/13/2015   HDL 73.90 07/13/2015   LDLCALC 94 07/13/2015   ALT 11 01/08/2018   AST 15 01/08/2018   NA 139 01/08/2018   K 4.2 01/08/2018   CL 104  01/08/2018   CREATININE 0.62 01/08/2018   BUN 14 01/08/2018   CO2 30 01/08/2018   TSH 0.51 01/08/2018    Lab Results  Component Value Date   TSH 0.51 01/08/2018   Lab Results  Component Value Date   WBC 4.9 04/10/2018   HGB 10.7 (L) 04/10/2018   HCT 34.4 (L) 04/10/2018   MCV 73.6 (L) 04/10/2018   PLT 205.0 04/10/2018   Lab Results  Component Value Date   NA 139 01/08/2018   K 4.2 01/08/2018   CO2 30 01/08/2018   GLUCOSE 92 01/08/2018   BUN 14 01/08/2018   CREATININE 0.62 01/08/2018   BILITOT 0.2 01/08/2018   ALKPHOS 68 01/08/2018   AST 15 01/08/2018   ALT 11 01/08/2018   PROT 7.8 01/08/2018   ALBUMIN 4.0 01/08/2018   CALCIUM 9.8 01/08/2018   CALCIUM 9.6 01/08/2018   GFR 126.57 01/08/2018   Lab Results  Component Value Date   CHOL 190 07/13/2015   Lab Results  Component Value Date   HDL 73.90 07/13/2015   Lab Results  Component Value Date   LDLCALC 94 07/13/2015   Lab Results  Component Value Date   TRIG 111.0 07/13/2015   Lab Results  Component Value Date   CHOLHDL 3 07/13/2015   No results found for: HGBA1C     Assessment & Plan:   Problem List Items Addressed This Visit    Rheumatoid arthritis (HCC)    Continues to follow with daily pain, manages to work full time with current meds prn.       Chronic cough   Relevant Orders   Ambulatory referral to Pulmonology   DG Chest 2 View (Completed)   Rheumatoid lung disease (HCC)    Cough has  worsened. Xray does not show any acute process. Encouraged to use her allergy meds and inhalers regularly and referred back to pulmonology.        Other Visit Diagnoses    Chills    -  Primary   Relevant Orders   Ambulatory referral to Pulmonology   DG Chest 2 View (Completed)      I have discontinued Karenna Blish's amoxicillin-clavulanate. I am also having her start on amoxicillin-clavulanate. Additionally, I am having her maintain her albuterol, methotrexate, fexofenadine, gabapentin, SYMBICORT,  metoprolol succinate, montelukast, fluticasone, pantoprazole, predniSONE, meloxicam, HEMATINIC/FOLIC ACID, famotidine, HYDROcodone-acetaminophen, and HYDROcodone-homatropine.  Meds ordered this encounter  Medications  . amoxicillin-clavulanate (AUGMENTIN) 875-125 MG tablet    Sig: Take 1 tablet by mouth 2 (two) times daily.    Dispense:  20 tablet    Refill:  0   Danise Edge, MD

## 2018-04-29 NOTE — Assessment & Plan Note (Addendum)
Cough has worsened. Xray does not show any acute process. Encouraged to use her allergy meds and inhalers regularly and referred back to pulmonology.

## 2018-05-08 ENCOUNTER — Other Ambulatory Visit: Payer: Self-pay | Admitting: Family Medicine

## 2018-05-11 ENCOUNTER — Other Ambulatory Visit: Payer: Self-pay

## 2018-05-11 MED ORDER — METOPROLOL SUCCINATE ER 25 MG PO TB24: 25.0000 mg | ORAL_TABLET | Freq: Every day | ORAL | 5 refills | Status: DC

## 2018-05-23 ENCOUNTER — Other Ambulatory Visit: Payer: Self-pay

## 2018-05-23 DIAGNOSIS — R05 Cough: Secondary | ICD-10-CM

## 2018-05-23 DIAGNOSIS — M069 Rheumatoid arthritis, unspecified: Secondary | ICD-10-CM

## 2018-05-23 DIAGNOSIS — R053 Chronic cough: Secondary | ICD-10-CM

## 2018-05-23 MED ORDER — HYDROCODONE-HOMATROPINE 5-1.5 MG/5ML PO SYRP: 5.0000 mL | ORAL_SOLUTION | Freq: Three times a day (TID) | ORAL | 0 refills | Status: DC | PRN

## 2018-05-23 MED ORDER — HYDROCODONE-ACETAMINOPHEN 5-325 MG PO TABS: 1.0000 | ORAL_TABLET | Freq: Four times a day (QID) | ORAL | 0 refills | Status: DC | PRN

## 2018-05-23 NOTE — Telephone Encounter (Signed)
Prolia benefits verified. PA required, and approved 07/21/17-07/22/2019. Pt. may owe approximately $0 OOP. Author phoned pt. to set up NV appointment. Appointment made for 12/23 at 10AM. Prolia medication ordered, awaiting delivery.

## 2018-05-23 NOTE — Telephone Encounter (Signed)
Medication arrived. Labled  One kit  for patient.

## 2018-05-23 NOTE — Telephone Encounter (Signed)
When phoning author re: prolia appointment, pt. Was coughing throughout coversation. When asked if she was ok and/or would like an OV appointment, pt. Stated "I see ENT tomorrow, so I'm ok". Pt. did ask for a refill on her hydrocodone while on the phone however. It appears pt. Is due for both nocro and hycodan syrup. Routed to Dr. Abner Greenspan to review, orders left pended.

## 2018-05-28 ENCOUNTER — Ambulatory Visit (INDEPENDENT_AMBULATORY_CARE_PROVIDER_SITE_OTHER): Payer: BC Managed Care – PPO

## 2018-05-28 DIAGNOSIS — M810 Age-related osteoporosis without current pathological fracture: Secondary | ICD-10-CM

## 2018-05-28 MED ORDER — DENOSUMAB 60 MG/ML ~~LOC~~ SOSY
60.0000 mg | PREFILLED_SYRINGE | Freq: Once | SUBCUTANEOUS | Status: AC
  Administered 2018-05-28: 60 mg via SUBCUTANEOUS

## 2018-05-28 NOTE — Progress Notes (Signed)
Pre visit review using our clinic tool,if applicable. No additional management support is needed unless otherwise documented below in the visit note.   Patient in for Prolia injection per order from Dr. Danise Edge.  Given 60 mg SQ right arm.  Patient tolerated well no complaints voiced.  Reminder card given for next injection.

## 2018-05-29 DIAGNOSIS — J0141 Acute recurrent pansinusitis: Secondary | ICD-10-CM | POA: Insufficient documentation

## 2018-06-17 IMAGING — DX DG CHEST 2V
2 series · 2 of 2 positions shown · non-contrast
Comparison: 11/21/2016.

CLINICAL DATA: Chronic cough.

EXAM:
CHEST  2 VIEW

[chest pa]
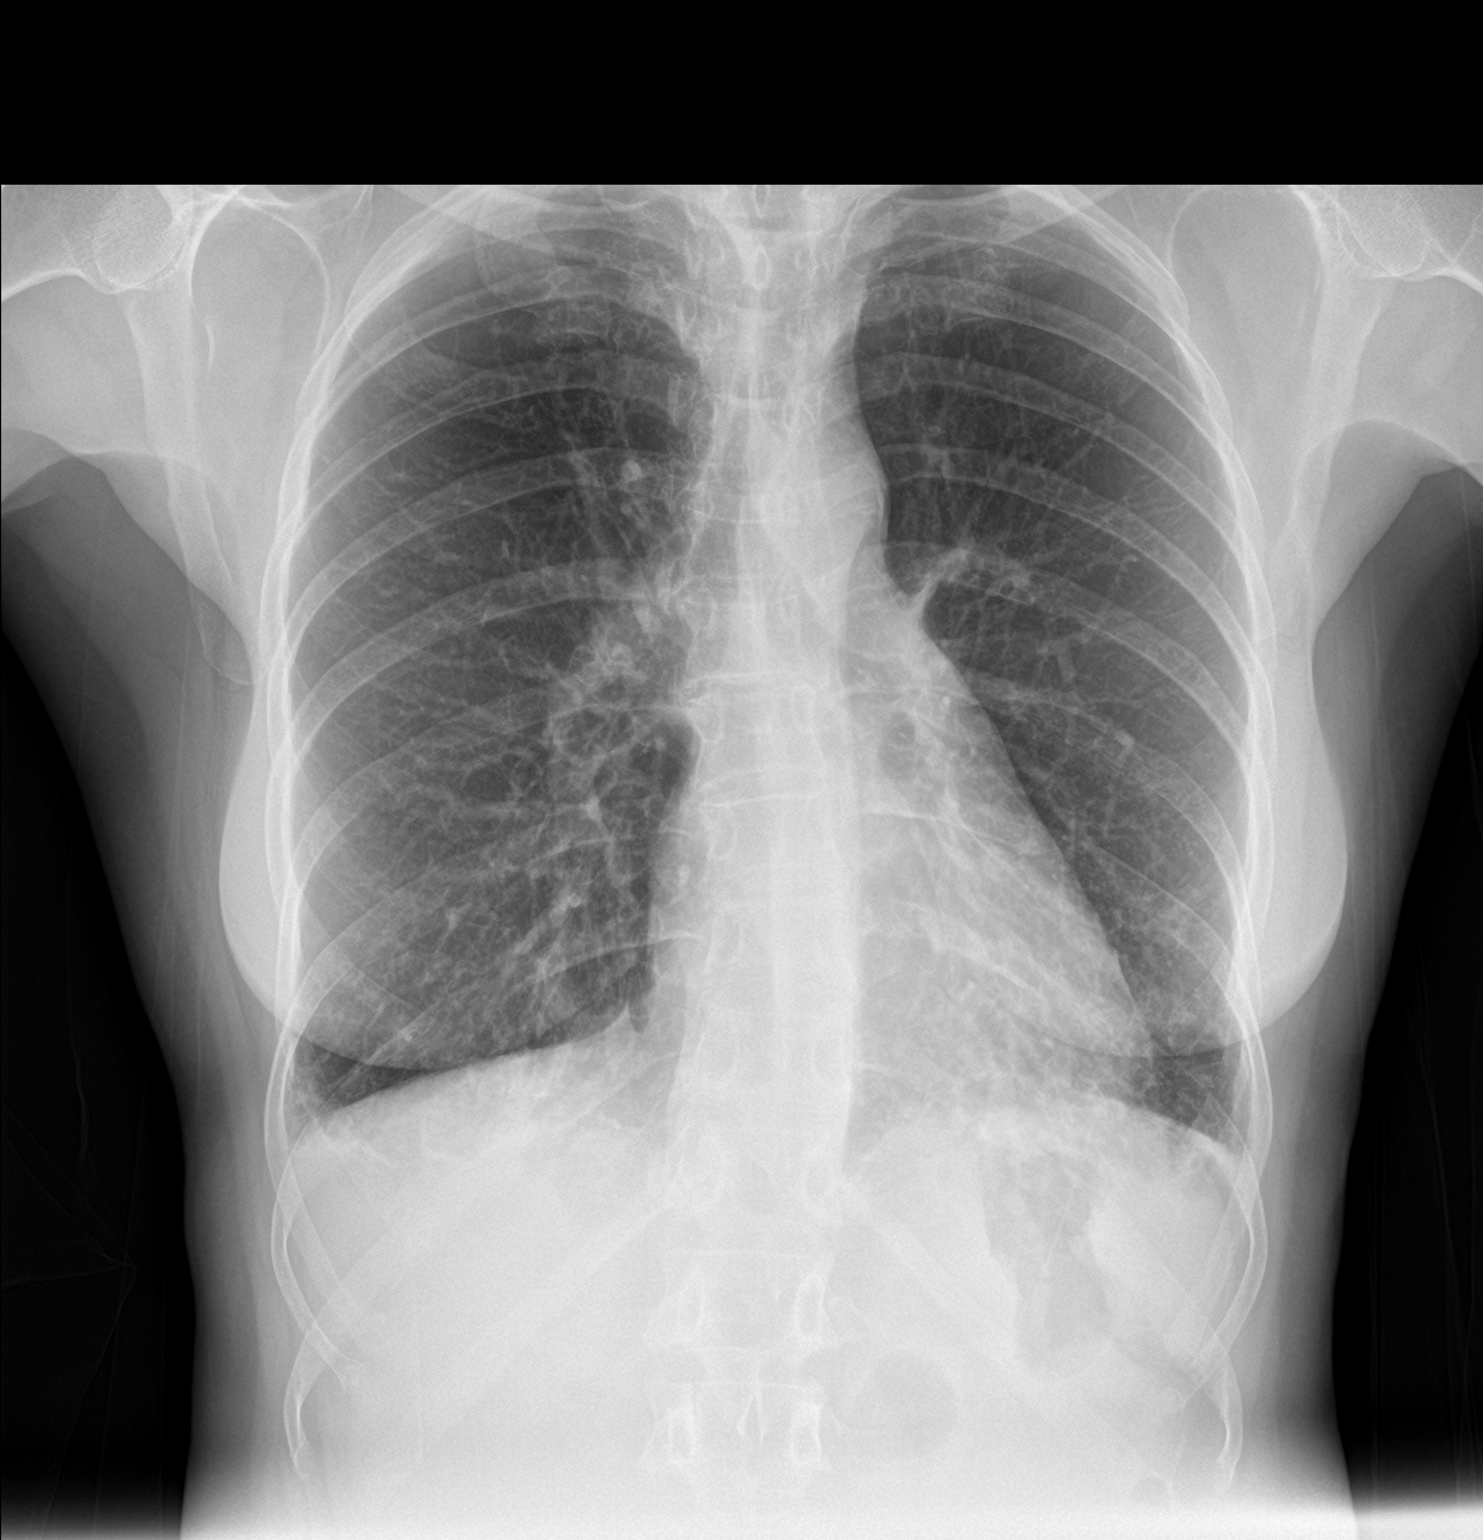

[chest lat]
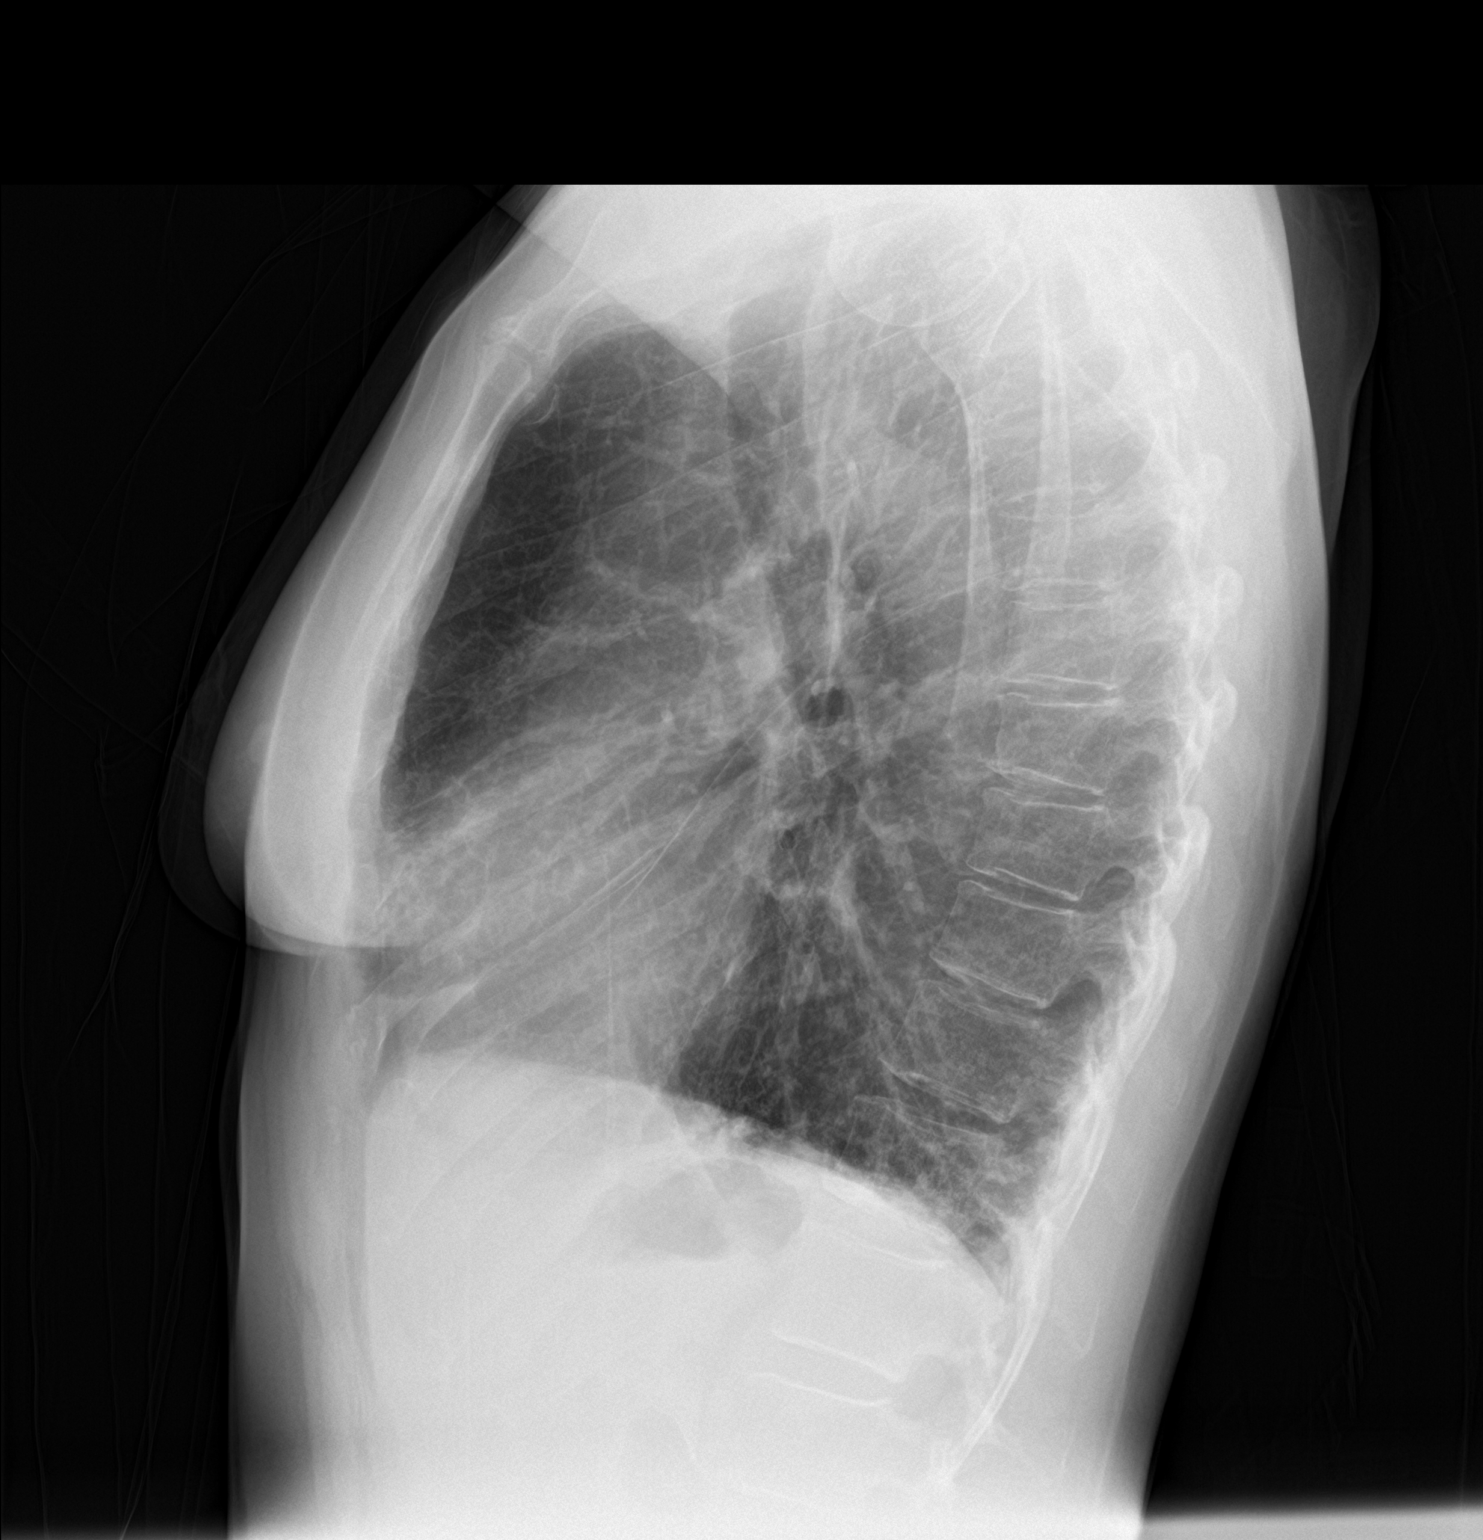

[2 of 2 positions shown; findings below may reference images not displayed]

FINDINGS: Mediastinum and hilar structures are normal. Lungs are clear. Mild
lingular infiltrate noted. Mild basal pleural-parenchymal thickening
consistent with scarring. No pleural effusion or pneumothorax.
Thoracic spine scoliosis. No acute bony abnormality .
IMPRESSION: Mild lingular infiltrate, pneumonia cannot be excluded. Mild basal
pleural-parenchymal thickening consistent with scarring .

## 2018-07-09 ENCOUNTER — Ambulatory Visit: Payer: BC Managed Care – PPO | Admitting: Family Medicine

## 2018-07-09 VITALS — BP 123/73 | HR 86 | Temp 97.9°F | Resp 18 | Wt 103.0 lb

## 2018-07-09 DIAGNOSIS — Z79899 Other long term (current) drug therapy: Secondary | ICD-10-CM

## 2018-07-09 DIAGNOSIS — R05 Cough: Secondary | ICD-10-CM

## 2018-07-09 DIAGNOSIS — M069 Rheumatoid arthritis, unspecified: Secondary | ICD-10-CM

## 2018-07-09 DIAGNOSIS — R7989 Other specified abnormal findings of blood chemistry: Secondary | ICD-10-CM

## 2018-07-09 DIAGNOSIS — R053 Chronic cough: Secondary | ICD-10-CM

## 2018-07-09 DIAGNOSIS — R700 Elevated erythrocyte sedimentation rate: Secondary | ICD-10-CM | POA: Diagnosis not present

## 2018-07-09 DIAGNOSIS — D649 Anemia, unspecified: Secondary | ICD-10-CM | POA: Diagnosis not present

## 2018-07-09 DIAGNOSIS — M810 Age-related osteoporosis without current pathological fracture: Secondary | ICD-10-CM

## 2018-07-09 DIAGNOSIS — R000 Tachycardia, unspecified: Secondary | ICD-10-CM | POA: Diagnosis not present

## 2018-07-09 LAB — COMPREHENSIVE METABOLIC PANEL
ALK PHOS: 52 U/L (ref 39–117)
ALT: 15 U/L (ref 0–35)
AST: 16 U/L (ref 0–37)
Albumin: 4.4 g/dL (ref 3.5–5.2)
BILIRUBIN TOTAL: 0.3 mg/dL (ref 0.2–1.2)
BUN: 17 mg/dL (ref 6–23)
CO2: 27 meq/L (ref 19–32)
Calcium: 9.5 mg/dL (ref 8.4–10.5)
Chloride: 105 mEq/L (ref 96–112)
Creatinine, Ser: 0.89 mg/dL (ref 0.40–1.20)
GFR: 78.33 mL/min (ref >60.00)
GLUCOSE: 90 mg/dL (ref 70–99)
Potassium: 4.4 mEq/L (ref 3.5–5.1)
Sodium: 139 mEq/L (ref 135–145)
TOTAL PROTEIN: 7.9 g/dL (ref 6.0–8.3)

## 2018-07-09 LAB — LIPID PANEL
CHOL/HDL RATIO: 2
Cholesterol: 205 mg/dL — ABNORMAL HIGH (ref 0–200)
HDL: 90 mg/dL (ref >39.00)
LDL Cholesterol: 104 mg/dL — ABNORMAL HIGH (ref 0–99)
NONHDL: 114.5
Triglycerides: 55 mg/dL (ref 0.0–149.0)
VLDL: 11 mg/dL (ref 0.0–40.0)

## 2018-07-09 LAB — VITAMIN D 25 HYDROXY (VIT D DEFICIENCY, FRACTURES): VITD: 47.08 ng/mL (ref 30.00–100.00)

## 2018-07-09 LAB — CBC WITH DIFFERENTIAL/PLATELET
BASOS ABS: 0 10*3/uL (ref 0.0–0.1)
Basophils Relative: 0.9 % (ref 0.0–3.0)
EOS ABS: 0.3 10*3/uL (ref 0.0–0.7)
Eosinophils Relative: 7.3 % — ABNORMAL HIGH (ref 0.0–5.0)
HEMATOCRIT: 35 % — AB (ref 36.0–46.0)
HEMOGLOBIN: 10.9 g/dL — AB (ref 12.0–15.0)
LYMPHS PCT: 16 % (ref 12.0–46.0)
Lymphs Abs: 0.6 10*3/uL — ABNORMAL LOW (ref 0.7–4.0)
MCHC: 31.1 g/dL (ref 30.0–36.0)
MCV: 74 fl — AB (ref 78.0–100.0)
MONOS PCT: 10.9 % (ref 3.0–12.0)
Monocytes Absolute: 0.4 10*3/uL (ref 0.1–1.0)
Neutro Abs: 2.3 10*3/uL (ref 1.4–7.7)
Neutrophils Relative %: 64.9 % (ref 43.0–77.0)
Platelets: 184 10*3/uL (ref 150.0–400.0)
RBC: 4.72 Mil/uL (ref 3.87–5.11)
RDW: 15.9 % — ABNORMAL HIGH (ref 11.5–15.5)
WBC: 3.5 10*3/uL — AB (ref 4.0–10.5)

## 2018-07-09 LAB — TSH: TSH: 0.88 u[IU]/mL (ref 0.35–4.50)

## 2018-07-09 LAB — SEDIMENTATION RATE: Sed Rate: 50 mm/hr — ABNORMAL HIGH (ref 0–30)

## 2018-07-09 MED ORDER — CIPROFLOXACIN HCL 500 MG PO TABS: 500.0000 mg | ORAL_TABLET | Freq: Two times a day (BID) | ORAL | 0 refills | Status: DC

## 2018-07-09 MED ORDER — HYDROCODONE-ACETAMINOPHEN 5-325 MG PO TABS: 1.0000 | ORAL_TABLET | Freq: Four times a day (QID) | ORAL | 0 refills | Status: DC | PRN

## 2018-07-09 MED ORDER — MELOXICAM 7.5 MG PO TABS: ORAL_TABLET | ORAL | 3 refills | Status: DC

## 2018-07-09 NOTE — Assessment & Plan Note (Signed)
Check cmp and vitamin D 

## 2018-07-09 NOTE — Assessment & Plan Note (Signed)
Check pth 

## 2018-07-09 NOTE — Patient Instructions (Signed)
Anemia  Anemia is a condition in which you do not have enough red blood cells or hemoglobin. Hemoglobin is a substance in red blood cells that carries oxygen. When you do not have enough red blood cells or hemoglobin (are anemic), your body cannot get enough oxygen and your organs may not work properly. As a result, you may feel very tired or have other problems. What are the causes? Common causes of anemia include:  Excessive bleeding. Anemia can be caused by excessive bleeding inside or outside the body, including bleeding from the intestine or from periods in women.  Poor nutrition.  Long-lasting (chronic) kidney, thyroid, and liver disease.  Bone marrow disorders.  Cancer and treatments for cancer.  HIV (human immunodeficiency virus) and AIDS (acquired immunodeficiency syndrome).  Treatments for HIV and AIDS.  Spleen problems.  Blood disorders.  Infections, medicines, and autoimmune disorders that destroy red blood cells. What are the signs or symptoms? Symptoms of this condition include:  Minor weakness.  Dizziness.  Headache.  Feeling heartbeats that are irregular or faster than normal (palpitations).  Shortness of breath, especially with exercise.  Paleness.  Cold sensitivity.  Indigestion.  Nausea.  Difficulty sleeping.  Difficulty concentrating. Symptoms may occur suddenly or develop slowly. If your anemia is mild, you may not have symptoms. How is this diagnosed? This condition is diagnosed based on:  Blood tests.  Your medical history.  A physical exam.  Bone marrow biopsy. Your health care provider may also check your stool (feces) for blood and may do additional testing to look for the cause of your bleeding. You may also have other tests, including:  Imaging tests, such as a CT scan or MRI.  Endoscopy.  Colonoscopy. How is this treated? Treatment for this condition depends on the cause. If you continue to lose a lot of blood, you may  need to be treated at a hospital. Treatment may include:  Taking supplements of iron, vitamin S31, or folic acid.  Taking a hormone medicine (erythropoietin) that can help to stimulate red blood cell growth.  Having a blood transfusion. This may be needed if you lose a lot of blood.  Making changes to your diet.  Having surgery to remove your spleen. Follow these instructions at home:  Take over-the-counter and prescription medicines only as told by your health care provider.  Take supplements only as told by your health care provider.  Follow any diet instructions that you were given.  Keep all follow-up visits as told by your health care provider. This is important. Contact a health care provider if:  You develop new bleeding anywhere in the body. Get help right away if:  You are very weak.  You are short of breath.  You have pain in your abdomen or chest.  You are dizzy or feel faint.  You have trouble concentrating.  You have bloody or black, tarry stools.  You vomit repeatedly or you vomit up blood. Summary  Anemia is a condition in which you do not have enough red blood cells or enough of a substance in your red blood cells that carries oxygen (hemoglobin).  Symptoms may occur suddenly or develop slowly.  If your anemia is mild, you may not have symptoms.  This condition is diagnosed with blood tests as well as a medical history and physical exam. Other tests may be needed.  Treatment for this condition depends on the cause of the anemia. This information is not intended to replace advice given to you by  your health care provider. Make sure you discuss any questions you have with your health care provider. Document Released: 06/30/2004 Document Revised: 06/24/2016 Document Reviewed: 06/24/2016 Elsevier Interactive Patient Education  2019 [employer].

## 2018-07-09 NOTE — Assessment & Plan Note (Signed)
Increase leafy greens, consider increased lean red meat and using cast iron cookware. Continue to monitor, report any concerns 

## 2018-07-09 NOTE — Assessment & Plan Note (Signed)
Was recently treated with ciprofloxacin and felt much better and now cough is worsening as is the congestion will refill the Ciprofloxacin and she is encouraged to hydrate

## 2018-07-09 NOTE — Assessment & Plan Note (Signed)
RRR 

## 2018-07-11 LAB — PTH, INTACT AND CALCIUM
CALCIUM: 9.3 mg/dL (ref 8.6–10.4)
PTH: 104 pg/mL — AB (ref 14–64)

## 2018-07-13 LAB — PAIN MGMT, PROFILE 8 W/CONF, U
6 ACETYLMORPHINE: NEGATIVE ng/mL (ref <10)
ALCOHOL METABOLITES: NEGATIVE ng/mL (ref <500)
AMPHETAMINES: NEGATIVE ng/mL (ref <500)
Benzodiazepines: NEGATIVE ng/mL (ref <100)
Buprenorphine, Urine: NEGATIVE ng/mL (ref <5)
CODEINE: NEGATIVE ng/mL (ref <50)
Cocaine Metabolite: NEGATIVE ng/mL (ref <150)
Creatinine: 40.6 mg/dL
HYDROCODONE: NEGATIVE ng/mL (ref <50)
Hydromorphone: 82 ng/mL — ABNORMAL HIGH (ref <50)
MDMA: NEGATIVE ng/mL (ref <500)
Marijuana Metabolite: NEGATIVE ng/mL (ref <20)
Morphine: NEGATIVE ng/mL (ref <50)
NORHYDROCODONE: 165 ng/mL — AB (ref <50)
OPIATES: POSITIVE ng/mL — AB (ref <100)
OXYCODONE: NEGATIVE ng/mL (ref <100)
Oxidant: NEGATIVE ug/mL (ref <200)
pH: 6.58 (ref 4.5–9.0)

## 2018-07-15 NOTE — Progress Notes (Signed)
Subjective:    Patient ID: Jamie Cox, female    DOB: [birth year], 58 y.o.   MRN: [id number]  No chief complaint on file.   HPI Patient is in today for follow-up.  She is feeling generally well today although she does note she recently was given a course of ciprofloxacin for an infection and she noted her cough got tremendously better maybe 80%.  After stopping the Cipro the cough is slowly been worsening once again.  No fevers or chills.  Cough is nonproductive.  She does continue to struggle with fatigue and malaise.  She continues to have chronic pain from her rheumatoid arthritis but manages to work full-time using minimal pain meds. Denies CP/palp/HA/fevers/GI or GU c/o. Taking meds as prescribed  Past Medical History:  Diagnosis Date  . Allergy   . Asthma   . Atypical chest pain   . Chronic sinusitis    Followed by Dr. Jenne Pane  . Environmental allergies   . GERD (gastroesophageal reflux disease)   . 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 History of chicken pox   . Hypercalcemia 07/19/2015  . Osteoporosis, unspecified    steroid induced  . Personal history of other diseases of circulatory system   . Rheumatoid arthritis(714.0)   . Unspecified deficiency anemia    microcytic  . Urticaria     Past Surgical History:  Procedure Laterality Date  . NASAL SINUS SURGERY  06/2012    Family History  Problem Relation Age of Onset  . Liver cancer Maternal Grandmother   . Hypertension Maternal Grandmother   . Heart attack Maternal Grandmother   . Scleroderma Mother   . Heart disease Mother   . Kidney disease Mother   . Prostate cancer Father   . Diabetes Father   . Heart disease Father        CHF  . Arthritis Father        s/p hip replacement  . Cancer Father        lung cancer with mets, smoker  . Lung cancer Paternal Uncle   . Coronary artery disease Paternal Grandmother   . Hypertension Paternal Grandmother   . Liver cancer Paternal Grandmother   .  Hyperlipidemia Son   . Alzheimer's disease Paternal Grandfather   . Other Sister        Cardiomegaly  . Arthritis Sister   . Heart disease Sister        rare  . Sleep apnea Brother   . Arthritis Daughter   . Lupus Cousin   . Arthritis Cousin   . Colon cancer Neg Hx     Social History   Socioeconomic History  . Marital status: Married    Spouse name: Not on file  . Number of children: 2  . Years of education: Not on file  . Highest education level: Not on file  Occupational History  . Occupation: Data processing manager  Social Needs  . Financial resource strain: Not on file  . Food insecurity:    Worry: Not on file    Inability: Not on file  . Transportation needs:    Medical: Not on file    Non-medical: Not on file  Tobacco Use  . Smoking status: Never Smoker  . Smokeless tobacco: Never Used  Substance and Sexual Activity  . Alcohol use: No    Alcohol/week: 0.0 standard drinks  . Drug use: No  . Sexual activity: Not Currently  Lifestyle  . Physical activity:    Days  per week: Not on file    Minutes per session: Not on file  . Stress: Not on file  Relationships  . Social connections:    Talks on phone: Not on file    Gets together: Not on file    Attends religious service: Not on file    Active member of club or organization: Not on file    Attends meetings of clubs or organizations: Not on file    Relationship status: Not on file  . Intimate partner violence:    Fear of current or ex partner: Not on file    Emotionally abused: Not on file    Physically abused: Not on file    Forced sexual activity: Not on file  Other Topics Concern  . Not on file  Social History Narrative  . Not on file    Outpatient Medications Prior to Visit  Medication Sig Dispense Refill  . albuterol (PROVENTIL HFA;VENTOLIN HFA) 108 (90 Base) MCG/ACT inhaler Inhale 1-2 puffs into the lungs every 6 (six) hours as needed for wheezing or shortness of breath (cough). For shortness of  breath/wheezing 3.7 g 1  . famotidine (PEPCID) 40 MG tablet Take 1 tablet (40 mg total) by mouth daily. 30 tablet 5  . fexofenadine (ALLEGRA) 180 MG tablet Take 180 mg by mouth daily.    . fluticasone (FLONASE) 50 MCG/ACT nasal spray USE TWO SPRAY(S) IN EACH NOSTRIL ONCE DAILY 48 g 3  . gabapentin (NEURONTIN) 300 MG capsule Take 1 capsule by mouth 3 (three) times daily.    Marland Kitchen. HEMATINIC/FOLIC ACID 324-1 MG TABS TAKE 1 TABLET BY MOUTH EVERY DAY 90 each 1  . HYDROcodone-homatropine (HYCODAN) 5-1.5 MG/5ML syrup Take 5 mLs by mouth every 8 (eight) hours as needed for cough. 120 mL 0  . methotrexate 2.5 MG tablet     . metoprolol succinate (TOPROL-XL) 25 MG 24 hr tablet Take 1 tablet (25 mg total) by mouth daily. 30 tablet 5  . montelukast (SINGULAIR) 10 MG tablet TAKE 1 TABLET BY MOUTH ONCE DAILY AT BEDTIME 30 tablet 5  . pantoprazole (PROTONIX) 40 MG tablet Take 1 tablet (40 mg total) by mouth daily. 90 tablet 1  . predniSONE (DELTASONE) 2.5 MG tablet TAKE ONE TABLET BY MOUTH ONCE DAILY WITH  BREAKFAST 90 tablet 1  . SYMBICORT 80-4.5 MCG/ACT inhaler INHALE 2 PUFFS INTO THE LUNGS TWO TIMES DAILY 1 Inhaler 3  . amoxicillin-clavulanate (AUGMENTIN) 875-125 MG tablet Take 1 tablet by mouth 2 (two) times daily. 20 tablet 0  . HYDROcodone-acetaminophen (NORCO/VICODIN) 5-325 MG tablet Take 1 tablet by mouth every 6 (six) hours as needed for moderate pain. March 2019 60 tablet 0  . meloxicam (MOBIC) 7.5 MG tablet TAKE 1 TO 2 TABLETS BY MOUTH ONCE DAILY AS NEEDED FOR PAIN 60 tablet 3   No facility-administered medications prior to visit.     Allergies  Allergen Reactions  . Avelox [Moxifloxacin Hcl In Nacl] Diarrhea, Nausea And Vomiting and Other (See Comments)    Dizziness, Cold chills   . Bactrim [Sulfamethoxazole-Trimethoprim] Hives  . Sulfamethoxazole-Trimethoprim Hives    Review of Systems  Constitutional: Positive for malaise/fatigue. Negative for fever.  HENT: Positive for congestion.   Eyes:  Negative for blurred vision.  Respiratory: Positive for cough. Negative for shortness of breath.   Cardiovascular: Negative for chest pain, palpitations and leg swelling.  Gastrointestinal: Negative for abdominal pain, blood in stool and nausea.  Genitourinary: Negative for dysuria and frequency.  Musculoskeletal: Negative for falls.  Skin: Negative for rash.  Neurological: Negative for dizziness, loss of consciousness and headaches.  Endo/Heme/Allergies: Negative for environmental allergies.  Psychiatric/Behavioral: Negative for depression. The patient is not nervous/anxious.        Objective:    Physical Exam Vitals signs and nursing note reviewed.  Constitutional:      General: She is not in acute distress.    Appearance: She is well-developed.  HENT:     Head: Normocephalic and atraumatic.     Nose: Nose normal.  Eyes:     General:        Right eye: No discharge.        Left eye: No discharge.  Neck:     Musculoskeletal: Normal range of motion and neck supple.  Cardiovascular:     Rate and Rhythm: Normal rate and regular rhythm.     Heart sounds: No murmur.  Pulmonary:     Effort: Pulmonary effort is normal.     Breath sounds: Normal breath sounds.  Abdominal:     General: Bowel sounds are normal.     Palpations: Abdomen is soft.     Tenderness: There is no abdominal tenderness.  Skin:    General: Skin is warm and dry.  Neurological:     Mental Status: She is alert and oriented to person, place, and time.     BP 123/73 (BP Location: Left Arm, Patient Position: Sitting, Cuff Size: Normal)   Pulse 86   Temp 97.9 F (36.6 C) (Oral)   Resp 18   Wt 103 lb (46.7 kg)   SpO2 90%   BMI 19.45 kg/m  Wt Readings from Last 3 Encounters:  07/09/18 103 lb (46.7 kg)  04/26/18 104 lb 12.8 oz (47.5 kg)  04/10/18 105 lb 6.4 oz (47.8 kg)     Lab Results  Component Value Date   WBC 3.5 (L) 07/09/2018   HGB 10.9 (L) 07/09/2018   HCT 35.0 (L) 07/09/2018   PLT 184.0  07/09/2018   GLUCOSE 90 07/09/2018   CHOL 205 (H) 07/09/2018   TRIG 55.0 07/09/2018   HDL 90.00 07/09/2018   LDLCALC 104 (H) 07/09/2018   ALT 15 07/09/2018   AST 16 07/09/2018   NA 139 07/09/2018   K 4.4 07/09/2018   CL 105 07/09/2018   CREATININE 0.89 07/09/2018   BUN 17 07/09/2018   CO2 27 07/09/2018   TSH 0.88 07/09/2018    Lab Results  Component Value Date   TSH 0.88 07/09/2018   Lab Results  Component Value Date   WBC 3.5 (L) 07/09/2018   HGB 10.9 (L) 07/09/2018   HCT 35.0 (L) 07/09/2018   MCV 74.0 (L) 07/09/2018   PLT 184.0 07/09/2018   Lab Results  Component Value Date   NA 139 07/09/2018   K 4.4 07/09/2018   CO2 27 07/09/2018   GLUCOSE 90 07/09/2018   BUN 17 07/09/2018   CREATININE 0.89 07/09/2018   BILITOT 0.3 07/09/2018   ALKPHOS 52 07/09/2018   AST 16 07/09/2018   ALT 15 07/09/2018   PROT 7.9 07/09/2018   ALBUMIN 4.4 07/09/2018   CALCIUM 9.5 07/09/2018   CALCIUM 9.3 07/09/2018   GFR 78.33 07/09/2018   Lab Results  Component Value Date   CHOL 205 (H) 07/09/2018   Lab Results  Component Value Date   HDL 90.00 07/09/2018   Lab Results  Component Value Date   LDLCALC 104 (H) 07/09/2018   Lab Results  Component Value Date   TRIG 55.0  07/09/2018   Lab Results  Component Value Date   CHOLHDL 2 07/09/2018   No results found for: HGBA1C     Assessment & Plan:   Problem List Items Addressed This Visit    Anemia    Increase leafy greens, consider increased lean red meat and using cast iron cookware. Continue to monitor, report any concerns      Relevant Orders   CBC with Differential/Platelet (Completed)   Rheumatoid arthritis (HCC)    Manages to continue working with pain meds sparingly, prn. No changes      Relevant Medications   HYDROcodone-acetaminophen (NORCO/VICODIN) 5-325 MG tablet   meloxicam (MOBIC) 7.5 MG tablet   Osteoporosis    Encouraged to get adequate exercise, calcium and vitamin d intake      Chronic cough     Was recently treated with ciprofloxacin and felt much better and now cough is worsening as is the congestion will refill the Ciprofloxacin and she is encouraged to hydrate      Tachycardia    RRR      Relevant Orders   Lipid panel (Completed)   TSH (Completed)   PTH, Intact and Calcium (Completed)   Hypercalcemia    Check cmp and vitamin D      Relevant Orders   Comprehensive metabolic panel (Completed)   VITAMIN D 25 Hydroxy (Vit-D Deficiency, Fractures) (Completed)   High serum parathyroid hormone (PTH)    Check pth       Other Visit Diagnoses    High risk medication use    -  Primary   Relevant Orders   Pain Mgmt, Profile 8 w/Conf, U (Completed)   Elevated sed rate       Relevant Orders   Lipid panel (Completed)   Sedimentation rate (Completed)      I have discontinued Xyla Sahakian's amoxicillin-clavulanate. I am also having her start on ciprofloxacin. Additionally, I am having her maintain her albuterol, methotrexate, fexofenadine, gabapentin, SYMBICORT, fluticasone, pantoprazole, predniSONE, HEMATINIC/FOLIC ACID, famotidine, montelukast, metoprolol succinate, HYDROcodone-homatropine, HYDROcodone-acetaminophen, and meloxicam.  Meds ordered this encounter  Medications  . HYDROcodone-acetaminophen (NORCO/VICODIN) 5-325 MG tablet    Sig: Take 1 tablet by mouth every 6 (six) hours as needed for moderate pain. March 2019    Dispense:  60 tablet    Refill:  0  . meloxicam (MOBIC) 7.5 MG tablet    Sig: TAKE 1 TO 2 TABLETS BY MOUTH ONCE DAILY AS NEEDED FOR PAIN    Dispense:  60 tablet    Refill:  3    Please consider 90 day supplies to promote better adherence  . ciprofloxacin (CIPRO) 500 MG tablet    Sig: Take 1 tablet (500 mg total) by mouth 2 (two) times daily.    Dispense:  20 tablet    Refill:  0     Danise Edge, MD

## 2018-07-15 NOTE — Assessment & Plan Note (Signed)
Manages to continue working with pain meds sparingly, prn. No changes

## 2018-07-15 NOTE — Assessment & Plan Note (Signed)
Encouraged to get adequate exercise, calcium and vitamin d intake 

## 2018-08-10 ENCOUNTER — Other Ambulatory Visit: Payer: Self-pay

## 2018-08-10 ENCOUNTER — Ambulatory Visit: Payer: Self-pay | Admitting: *Deleted

## 2018-08-10 ENCOUNTER — Ambulatory Visit: Payer: BC Managed Care – PPO | Admitting: Family Medicine

## 2018-08-10 ENCOUNTER — Encounter: Payer: Self-pay | Admitting: Family Medicine

## 2018-08-10 VITALS — BP 120/70 | HR 91 | Temp 97.9°F | Ht 61.0 in | Wt 106.2 lb

## 2018-08-10 DIAGNOSIS — S46812A Strain of other muscles, fascia and tendons at shoulder and upper arm level, left arm, initial encounter: Secondary | ICD-10-CM

## 2018-08-10 MED ORDER — CYCLOBENZAPRINE HCL 5 MG PO TABS: 5.0000 mg | ORAL_TABLET | Freq: Three times a day (TID) | ORAL | 0 refills | Status: DC | PRN

## 2018-08-10 NOTE — Telephone Encounter (Signed)
"  Catch" left side of back and shoulder muscle is tight- patient works in Multimedia programmer.Patient states she has been taking Tylenol for discomfort. Patient has seasonal allergies, and bronchiolectasis and takes Mucinex and OTC regularly.  Reason for Disposition . [1] After 3 days (72 hours) AND [2] back pain not improving  Answer Assessment - Initial Assessment Questions 1. ONSET: "When did the pain start?"     3 days 2. LOCATION: "Where is the pain located?"     Left shoulder and back 3. PAIN: "How bad is the pain?" (Scale 1-10; or mild, moderate, severe)   - MILD (1-3): doesn't interfere with normal activities   - MODERATE (4-7): interferes with normal activities (e.g., work or school) or awakens from sleep   - SEVERE (8-10): excruciating pain, unable to do any normal activities, unable to move arm at all due to pain     mild 4. WORK OR EXERCISE: "Has there been any recent work or exercise that involved this part of the body?"     Patient did lift case of water 5. CAUSE: "What do you think is causing the shoulder pain?"     unsure 6. OTHER SYMPTOMS: "Do you have any other symptoms?" (e.g., neck pain, swelling, rash, fever, numbness, weakness)     no 7. PREGNANCY: "Is there any chance you are pregnant?" "When was your last menstrual period?"     n/a  Answer Assessment - Initial Assessment Questions 1. MECHANISM: "How did the injury happen?" (Consider the possibility of domestic violence or elder abuse)     Possibly lifted or moved and strained at work 2. ONSET: "When did the injury happen?" (Minutes or hours ago)     3 days ago 3. LOCATION: "What part of the back is injured?"     Between shoulder blades 4. SEVERITY: "Can you move the back normally?"     Moving normally 5. PAIN: "Is there any pain?" If so, ask: "How bad is the pain?"   (Scale 1-10; or mild, moderate, severe)     No pain- first day patient had pain- tightness in the muscle 6. CORD SYMPTOMS: Any weakness or numbness  of the arms or legs?"     no 7. SIZE: For cuts, bruises, or swelling, ask: "How large is it?" (e.g., inches or centimeters)     no 8. TETANUS: For any breaks in the skin, ask: "When was the last tetanus booster?"     n/a 9. OTHER SYMPTOMS: "Do you have any other symptoms?" (e.g., abdominal pain, blood in urine)     no 10. PREGNANCY: "Is there any chance you are pregnant?" "When was your last menstrual period?"       n/a  Protocols used: BACK INJURY-A-AH, SHOULDER PAIN-A-AH

## 2018-08-10 NOTE — Patient Instructions (Addendum)
Heat (pad or rice pillow in microwave) over affected area, 10-15 minutes twice daily.   OK to take Tylenol 1000 mg (2 extra strength tabs) or 975 mg (3 regular strength tabs) every 6 hours as needed.  Ibuprofen 400-600 mg (2-3 over the counter strength tabs) every 6 hours as needed for pain.  Take Flexeril (cyclobenzaprine) 1-2 hours before planned bedtime. If it makes you drowsy, do not take during the day. You can try half a tab the following night.  Send me a message in 3-4 weeks if no better.  Trapezius stretches/exercises Do exercises exactly as told by your health care provider and adjust them as directed. It is normal to feel mild stretching, pulling, tightness, or discomfort as you do these exercises, but you should stop right away if you feel sudden pain or your pain gets worse.  Stretching and range of motion exercises These exercises warm up your muscles and joints and improve the movement and flexibility of your shoulder. These exercises can also help to relieve pain, numbness, and tingling. If you are unable to do any of the following for any reason, do not further attempt to do it.   Exercise A: Flexion, standing    1. Stand and hold a broomstick, a cane, or a similar object. Place your hands a little more than shoulder-width apart on the object. Your left / right hand should be palm-up, and your other hand should be palm-down. 2. Push the stick to raise your left / right arm out to your side and then over your head. Use your other hand to help move the stick. Stop when you feel a stretch in your shoulder, or when you reach the angle that is recommended by your health care provider. ? Avoid shrugging your shoulder while you raise your arm. Keep your shoulder blade tucked down toward your spine. 3. Hold for 30 seconds. 4. Slowly return to the starting position. Repeat 2 times. Complete this exercise 3 times per week.  Exercise B: Abduction, supine    1. Lie on your back  and hold a broomstick, a cane, or a similar object. Place your hands a little more than shoulder-width apart on the object. Your left / right hand should be palm-up, and your other hand should be palm-down. 2. Push the stick to raise your left / right arm out to your side and then over your head. Use your other hand to help move the stick. Stop when you feel a stretch in your shoulder, or when you reach the angle that is recommended by your health care provider. ? Avoid shrugging your shoulder while you raise your arm. Keep your shoulder blade tucked down toward your spine. 3. Hold for 30 seconds. 4. Slowly return to the starting position. Repeat 2 times. Complete this exercise 3 times per week.  Exercise C: Flexion, active-assisted    1. Lie on your back. You may bend your knees for comfort. 2. Hold a broomstick, a cane, or a similar object. Place your hands about shoulder-width apart on the object. Your palms should face toward your feet. 3. Raise the stick and move your arms over your head and behind your head, toward the floor. Use your healthy arm to help your left / right arm move farther. Stop when you feel a gentle stretch in your shoulder, or when you reach the angle where your health care provider tells you to stop. 4. Hold for 30 seconds. 5. Slowly return to the starting position. Repeat  2 times. Complete this exercise 3 times per week.  Exercise D: External rotation and abduction    1. Stand in a door frame with one of your feet slightly in front of the other. This is called a staggered stance. 2. Choose one of the following positions as told by your health care provider: ? Place your hands and forearms on the door frame above your head. ? Place your hands and forearms on the door frame at the height of your head. ? Place your hands on the door frame at the height of your elbows. 3. Slowly move your weight onto your front foot until you feel a stretch across your chest and in  the front of your shoulders. Keep your head and chest upright and keep your abdominal muscles tight. 4. Hold for 30 seconds. 5. To release the stretch, shift your weight to your back foot. Repeat 2 times. Complete this stretch 3 times per week.  Strengthening exercises These exercises build strength and endurance in your shoulder. Endurance is the ability to use your muscles for a long time, even after your muscles get tired. Exercise E: Scapular depression and adduction  1. Sit on a stable chair. Support your arms in front of you with pillows, armrests, or a tabletop. Keep your elbows in line with the sides of your body. 2. Gently move your shoulder blades down toward your middle back. Relax the muscles on the tops of your shoulders and in the back of your neck. 3. Hold for 3 seconds. 4. Slowly release the tension and relax your muscles completely before doing this exercise again. Repeat for a total of 10 repetitions. 5. After you have practiced this exercise, try doing the exercise without the arm support. Then, try the exercise while standing instead of sitting. Repeat 2 times. Complete this exercise 3 times per week.  Exercise F: Shoulder abduction, isometric    1. Stand or sit about 4-6 inches (10-15 cm) from a wall with your left / right side facing the wall. 2. Bend your left / right elbow and gently press your elbow against the wall. 3. Increase the pressure slowly until you are pressing as hard as you can without shrugging your shoulder. 4. Hold for 3 seconds. 5. Slowly release the tension and relax your muscles completely. Repeat for a total of 10 repetitions. Repeat 2 times. Complete this exercise 3 times per week.  Exercise G: Shoulder flexion, isometric    1. Stand or sit about 4-6 inches (10-15 cm) away from a wall with your left / right side facing the wall. 2. Keep your left / right elbow straight and gently press the top of your fist against the wall. Increase the  pressure slowly until you are pressing as hard as you can without shrugging your shoulder. 3. Hold for 10-15 seconds. 4. Slowly release the tension and relax your muscles completely. Repeat for a total of 10 repetitions. Repeat 2 times. Complete this exercise 3 times per week.  Exercise H: Internal rotation    1. Sit in a stable chair without armrests, or stand. Secure an exercise band at your left / right side, at elbow height. 2. Place a soft object, such as a folded towel or a small pillow, under your left / right upper arm so your elbow is a few inches (about 8 cm) away from your side. 3. Hold the end of the exercise band so the band stretches. 4. Keeping your elbow pressed against the soft object  under your arm, move your forearm across your body toward your abdomen. Keep your body steady so the movement is only coming from your shoulder. 5. Hold for 3 seconds. 6. Slowly return to the starting position. Repeat for a total of 10 repetitions. Repeat 2 times. Complete this exercise 3 times per week.  Exercise I: External rotation    1. Sit in a stable chair without armrests, or stand. 2. Secure an exercise band at your left / right side, at elbow height. 3. Place a soft object, such as a folded towel or a small pillow, under your left / right upper arm so your elbow is a few inches (about 8 cm) away from your side. 4. Hold the end of the exercise band so the band stretches. 5. Keeping your elbow pressed against the soft object under your arm, move your forearm out, away from your abdomen. Keep your body steady so the movement is only coming from your shoulder. 6. Hold for 3 seconds. 7. Slowly return to the starting position. Repeat for a total of 10 repetitions. Repeat 2 times. Complete this exercise 3 times per week. Exercise J: Shoulder extension  1. Sit in a stable chair without armrests, or stand. Secure an exercise band to a stable object in front of you so the band is at  shoulder height. 2. Hold one end of the exercise band in each hand. Your palms should face each other. 3. Straighten your elbows and lift your hands up to shoulder height. 4. Step back, away from the secured end of the exercise band, until the band stretches. 5. Squeeze your shoulder blades together and pull your hands down to the sides of your thighs. Stop when your hands are straight down by your sides. Do not let your hands go behind your body. 6. Hold for 3 seconds. 7. Slowly return to the starting position. Repeat for a total of 10 repetitions. Repeat 2 times. Complete this exercise 3 times per week.  Exercise K: Shoulder extension, prone    1. Lie on your abdomen on a firm surface so your left / right arm hangs over the edge. 2. Hold a 5 lb weight in your hand so your palm faces in toward your body. Your arm should be straight. 3. Squeeze your shoulder blade down toward the middle of your back. 4. Slowly raise your arm behind you, up to the height of the surface that you are lying on. Keep your arm straight. 5. Hold for 3 seconds. 6. Slowly return to the starting position and relax your muscles. Repeat for a total of 10 repetitions. Repeat 2 times. Complete this exercise 3 times per week.   Exercise L: Horizontal abduction, prone  1. Lie on your abdomen on a firm surface so your left / right arm hangs over the edge. 2. Hold a 5 lb weight in your hand so your palm faces toward your feet. Your arm should be straight. 3. Squeeze your shoulder blade down toward the middle of your back. 4. Bend your elbow so your hand moves up, until your elbow is bent to an "L" shape (90 degrees). With your elbow bent, slowly move your forearm forward and up. Raise your hand up to the height of the surface that you are lying on. ? Your upper arm should not move, and your elbow should stay bent. ? At the top of the movement, your palm should face the floor. 5. Hold for 3 seconds. 6. Slowly return to the  starting position and relax your muscles. Repeat for a total of 10 repetitions. Repeat 2 times. Complete this exercise 3 times per week.  Exercise M: Horizontal abduction, standing  1. Sit on a stable chair, or stand. 2. Secure an exercise band to a stable object in front of you so the band is at shoulder height. 3. Hold one end of the exercise band in each hand. 4. Straighten your elbows and lift your hands straight in front of you, up to shoulder height. Your palms should face down, toward the floor. 5. Step back, away from the secured end of the exercise band, until the band stretches. 6. Move your arms out to your sides, and keep your arms straight. 7. Hold for 3 seconds. 8. Slowly return to the starting position. Repeat for a total of 10 repetitions. Repeat 2 times. Complete this exercise 3 times per week.  Exercise N: Scapular retraction and elevation  1. Sit on a stable chair, or stand. 2. Secure an exercise band to a stable object in front of you so the band is at shoulder height. 3. Hold one end of the exercise band in each hand. Your palms should face each other. 4. Sit in a stable chair without armrests, or stand. 5. Step back, away from the secured end of the exercise band, until the band stretches. 6. Squeeze your shoulder blades together and lift your hands over your head. Keep your elbows straight. 7. Hold for 3 seconds. 8. Slowly return to the starting position. Repeat for a total of 10 repetitions. Repeat 2 times. Complete this exercise 3 times per week.  This information is not intended to replace advice given to you by your health care provider. Make sure you discuss any questions you have with your health care provider. Document Released: 05/23/2005 Document Revised: 01/28/2016 Document Reviewed: 04/09/2015 Elsevier Interactive Patient Education  2017 [employer]

## 2018-08-10 NOTE — Progress Notes (Signed)
Musculoskeletal Exam  Patient: Jamie Cox DOB: 28-Mar-1961  DOS: 08/10/2018  SUBJECTIVE:  Chief Complaint:   Chief Complaint  Patient presents with  . Back Pain    x3 days, pt states back pain goes up to her left shoulder. Pt states taking Meloxicam for arthritis but not helping.    Jamie Cox is a 58 y.o.  female for evaluation and treatment of L shoulder/back pain.   Onset:  3 days ago. No inj or change in activity.  Location: L trap area Character:  dull  Progression of issue:  is unchanged Associated symptoms: none Treatment: to date has been prescription NSAIDS, acetaminophen, Icy Hot and hydrocodone.   Neurovascular symptoms: no  ROS: Musculoskeletal/Extremities: +L shoulder pain  Past Medical History:  Diagnosis Date  . Allergy   . Asthma   . Atypical chest pain   . Chronic sinusitis    Followed by Dr. Jenne Pane  . Environmental allergies   . GERD (gastroesophageal reflux disease)   . 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 History of chicken pox   . Hypercalcemia 07/19/2015  . Osteoporosis, unspecified    steroid induced  . Personal history of other diseases of circulatory system   . Rheumatoid arthritis(714.0)   . Unspecified deficiency anemia    microcytic  . Urticaria     Objective: VITAL SIGNS: BP 120/70 (BP Location: Left Arm, Patient Position: Sitting, Cuff Size: Normal)   Pulse 91   Temp 97.9 F (36.6 C) (Oral)   Ht 5\' 1"  (1.549 m)   Wt 106 lb 3.2 oz (48.2 kg)   SpO2 97%   BMI 20.07 kg/m  Constitutional: Well formed, well developed. No acute distress. Cardiovascular: Brisk cap refill Thorax & Lungs: No accessory muscle use Musculoskeletal: L trap.   Normal active range of motion: yes.   Normal passive range of motion: yes Tenderness to palpation: yes, over L trap Deformity: no Ecchymosis: no Tests positive: none Tests negative: Neer's, Hawkins, Cross over Neurologic: Normal sensory function. No focal deficits noted. DTR's  equal and symmetric in UE's. No clonus. Psychiatric: Normal mood. Age appropriate judgment and insight. Alert & oriented x 3.    Assessment:  Trapezius strain, left, initial encounter - Plan: cyclobenzaprine (FLEXERIL) 5 MG tablet  Plan: Orders as above. Flexeril warning provided. Heat, Tylenol, ibuprofen, stretches/exercises. F/u prn. The patient voiced understanding and agreement to the plan.   Jamie Cox [place], DO 08/10/18  11:40 AM

## 2018-08-23 ENCOUNTER — Telehealth: Payer: Self-pay

## 2018-08-23 ENCOUNTER — Other Ambulatory Visit: Payer: Self-pay | Admitting: Family Medicine

## 2018-08-23 DIAGNOSIS — M069 Rheumatoid arthritis, unspecified: Secondary | ICD-10-CM

## 2018-08-23 MED ORDER — HYDROCODONE-ACETAMINOPHEN 5-325 MG PO TABS: 1.0000 | ORAL_TABLET | Freq: Four times a day (QID) | ORAL | 0 refills | Status: DC | PRN

## 2018-08-23 NOTE — Telephone Encounter (Signed)
Copied from CRM [id number]. Topic: General - Inquiry >> Aug 23, 2018 10:29 AM Wyonia Hough E wrote: Reason for CRM: Pt's employer stated that anyone will an underlying illness related to the heart, blood pressure, and respiratory issues can be exempt from work due to being high risk. Pt needs a note stating her diagnoses for bronchiectasis and why she takes metoprolol succinate to be exempt/ please advise

## 2018-08-23 NOTE — Telephone Encounter (Signed)
Copied from CRM [id number]. Topic: Quick Communication - Rx Refill/Question >> Aug 23, 2018 10:28 AM Wyonia Hough E wrote: Medication: HYDROcodone-acetaminophen (NORCO/VICODIN) 5-325 MG tablet   Has the patient contacted their pharmacy? No   Preferred Pharmacy (with phone number or street name): [id number] - [place], [place] - [address] [phone] (Phone) [phone] (Fax)   Agent: Please be advised that RX refills may take up to 3 business days. We ask that you follow-up with your pharmacy.

## 2018-08-31 NOTE — Telephone Encounter (Signed)
Mailed letter to patient

## 2018-09-07 ENCOUNTER — Other Ambulatory Visit: Payer: Self-pay | Admitting: Family Medicine

## 2018-10-08 ENCOUNTER — Ambulatory Visit (INDEPENDENT_AMBULATORY_CARE_PROVIDER_SITE_OTHER): Payer: BC Managed Care – PPO | Admitting: Family Medicine

## 2018-10-08 ENCOUNTER — Other Ambulatory Visit: Payer: Self-pay | Admitting: Family Medicine

## 2018-10-08 ENCOUNTER — Other Ambulatory Visit: Payer: Self-pay

## 2018-10-08 DIAGNOSIS — M069 Rheumatoid arthritis, unspecified: Secondary | ICD-10-CM

## 2018-10-08 DIAGNOSIS — R7989 Other specified abnormal findings of blood chemistry: Secondary | ICD-10-CM

## 2018-10-08 DIAGNOSIS — J45991 Cough variant asthma: Secondary | ICD-10-CM | POA: Diagnosis not present

## 2018-10-08 DIAGNOSIS — K219 Gastro-esophageal reflux disease without esophagitis: Secondary | ICD-10-CM | POA: Diagnosis not present

## 2018-10-08 MED ORDER — HYDROCODONE-ACETAMINOPHEN 5-325 MG PO TABS: 1.0000 | ORAL_TABLET | Freq: Four times a day (QID) | ORAL | 0 refills | Status: DC | PRN

## 2018-10-08 MED ORDER — GABAPENTIN 100 MG PO CAPS: 100.0000 mg | ORAL_CAPSULE | Freq: Two times a day (BID) | ORAL | 3 refills | Status: DC

## 2018-10-10 NOTE — Assessment & Plan Note (Signed)
With patient's history of RA and pulmonary disease she is at high risk if she contracts COVID. Her work is considering reopening soon and she is advised that if they will allow her to stay home with a  Doctor's note we can help with that.

## 2018-10-10 NOTE — Progress Notes (Signed)
Virtual Visit via Video Note  I connected with Jamie Cox on 10/10/18 at 10:20 AM EDT by a video enabled telemedicine application and verified that I am speaking with the correct person using two identifiers.  Location: Patient: home Provider: office   I discussed the limitations of evaluation and management by telemedicine and the availability of in person appointments. The patient expressed understanding and agreed to proceed. Jamie Cox, CMA was able to get patient set up on a video platform.      Subjective:    Patient ID: Jamie Cox, female    DOB: 11/06/1960, 58 y.o.   MRN: [id number]  No chief complaint on file.   HPI Patient is in today for follow up on chronic medical concerns inclduing a persistent cough, reflux, asthma,  And more. She feels well today besides her chronic pain. Denies CP/palp/SOB/congestion/fevers/GI or GU c/o. Taking meds as prescribed Past Medical History:  Diagnosis Date  . Allergy   . Asthma   . Atypical chest pain   . Chronic sinusitis    Followed by Dr. Jenne PaneBates  . Environmental allergies   . GERD (gastroesophageal reflux disease)   . 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 History of chicken pox   . Hypercalcemia 07/19/2015  . Osteoporosis, unspecified    steroid induced  . Personal history of other diseases of circulatory system   . Rheumatoid arthritis(714.0)   . Unspecified deficiency anemia    microcytic  . Urticaria     Past Surgical History:  Procedure Laterality Date  . NASAL SINUS SURGERY  06/2012    Family History  Problem Relation Age of Onset  . Liver cancer Maternal Grandmother   . Hypertension Maternal Grandmother   . Heart attack Maternal Grandmother   . Scleroderma Mother   . Heart disease Mother   . Kidney disease Mother   . Prostate cancer Father   . Diabetes Father   . Heart disease Father        CHF  . Arthritis Father        s/p hip replacement  . Cancer Father        lung cancer with  mets, smoker  . Lung cancer Paternal Uncle   . Coronary artery disease Paternal Grandmother   . Hypertension Paternal Grandmother   . Liver cancer Paternal Grandmother   . Hyperlipidemia Son   . Alzheimer's disease Paternal Grandfather   . Other Sister        Cardiomegaly  . Arthritis Sister   . Heart disease Sister        rare  . Sleep apnea Brother   . Arthritis Daughter   . Lupus Cousin   . Arthritis Cousin   . Colon cancer Neg Hx     Social History   Socioeconomic History  . Marital status: Married    Spouse name: Not on file  . Number of children: 2  . Years of education: Not on file  . Highest education level: Not on file  Occupational History  . Occupation: Data processing managerAssistant teacher  Social Needs  . Financial resource strain: Not on file  . Food insecurity:    Worry: Not on file    Inability: Not on file  . Transportation needs:    Medical: Not on file    Non-medical: Not on file  Tobacco Use  . Smoking status: Never Smoker  . Smokeless tobacco: Never Used  Substance and Sexual Activity  . Alcohol use: No    Alcohol/week:  0.0 standard drinks  . Drug use: No  . Sexual activity: Not Currently  Lifestyle  . Physical activity:    Days per week: Not on file    Minutes per session: Not on file  . Stress: Not on file  Relationships  . Social connections:    Talks on phone: Not on file    Gets together: Not on file    Attends religious service: Not on file    Active member of club or organization: Not on file    Attends meetings of clubs or organizations: Not on file    Relationship status: Not on file  . Intimate partner violence:    Fear of current or ex partner: Not on file    Emotionally abused: Not on file    Physically abused: Not on file    Forced sexual activity: Not on file  Other Topics Concern  . Not on file  Social History Narrative  . Not on file    Outpatient Medications Prior to Visit  Medication Sig Dispense Refill  . albuterol (PROVENTIL  HFA;VENTOLIN HFA) 108 (90 Base) MCG/ACT inhaler Inhale 1-2 puffs into the lungs every 6 (six) hours as needed for wheezing or shortness of breath (cough). For shortness of breath/wheezing 3.7 g 1  . cyclobenzaprine (FLEXERIL) 5 MG tablet Take 1 tablet (5 mg total) by mouth 3 (three) times daily as needed for muscle spasms. 20 tablet 0  . famotidine (PEPCID) 40 MG tablet Take 1 tablet (40 mg total) by mouth daily. 30 tablet 5  . fexofenadine (ALLEGRA) 180 MG tablet Take 180 mg by mouth daily.    . fluticasone (FLONASE) 50 MCG/ACT nasal spray USE TWO SPRAY(S) IN EACH NOSTRIL ONCE DAILY 48 g 3  . gabapentin (NEURONTIN) 300 MG capsule Take 1 capsule (300 mg total) by mouth at bedtime.    Marland Kitchen HEMATINIC/FOLIC ACID 324-1 MG TABS TAKE 1 TABLET BY MOUTH EVERY DAY 90 each 1  . meloxicam (MOBIC) 7.5 MG tablet TAKE 1 TO 2 TABLETS BY MOUTH ONCE DAILY AS NEEDED FOR PAIN 60 tablet 3  . methotrexate 2.5 MG tablet     . metoprolol succinate (TOPROL-XL) 25 MG 24 hr tablet Take 1 tablet (25 mg total) by mouth daily. 30 tablet 5  . montelukast (SINGULAIR) 10 MG tablet TAKE 1 TABLET BY MOUTH ONCE DAILY AT BEDTIME 30 tablet 5  . pantoprazole (PROTONIX) 40 MG tablet Take 1 tablet (40 mg total) by mouth daily. 90 tablet 1  . SYMBICORT 80-4.5 MCG/ACT inhaler INHALE 2 PUFFS INTO THE LUNGS TWO TIMES DAILY 1 Inhaler 3  . gabapentin (NEURONTIN) 300 MG capsule Take 1 capsule by mouth 3 (three) times daily.    Marland Kitchen HYDROcodone-acetaminophen (NORCO/VICODIN) 5-325 MG tablet Take 1 tablet by mouth every 6 (six) hours as needed for moderate pain. 60 tablet 0  . predniSONE (DELTASONE) 2.5 MG tablet TAKE ONE TABLET BY MOUTH ONCE DAILY WITH  BREAKFAST 90 tablet 1   No facility-administered medications prior to visit.     Allergies  Allergen Reactions  . Avelox [Moxifloxacin Hcl In Nacl] Diarrhea, Nausea And Vomiting and Other (See Comments)    Dizziness, Cold chills   . Bactrim [Sulfamethoxazole-Trimethoprim] Hives  .  Sulfamethoxazole-Trimethoprim Hives    Review of Systems  Constitutional: Positive for malaise/fatigue. Negative for fever.  HENT: Positive for congestion.   Eyes: Negative for blurred vision.  Respiratory: Positive for cough and sputum production. Negative for shortness of breath.   Cardiovascular: Negative for chest  pain, palpitations and leg swelling.  Gastrointestinal: Positive for heartburn. Negative for abdominal pain, blood in stool and nausea.  Genitourinary: Negative for dysuria and frequency.  Musculoskeletal: Negative for falls.  Skin: Negative for rash.  Neurological: Negative for dizziness, loss of consciousness and headaches.  Endo/Heme/Allergies: Negative for environmental allergies.  Psychiatric/Behavioral: Negative for depression. The patient is not nervous/anxious.        Objective:    Physical Exam  There were no vitals taken for this visit. Wt Readings from Last 3 Encounters:  08/10/18 106 lb 3.2 oz (48.2 kg)  07/09/18 103 lb (46.7 kg)  04/26/18 104 lb 12.8 oz (47.5 kg)    Diabetic Foot Exam - Simple   No data filed     Lab Results  Component Value Date   WBC 3.5 (L) 07/09/2018   HGB 10.9 (L) 07/09/2018   HCT 35.0 (L) 07/09/2018   PLT 184.0 07/09/2018   GLUCOSE 90 07/09/2018   CHOL 205 (H) 07/09/2018   TRIG 55.0 07/09/2018   HDL 90.00 07/09/2018   LDLCALC 104 (H) 07/09/2018   ALT 15 07/09/2018   AST 16 07/09/2018   NA 139 07/09/2018   K 4.4 07/09/2018   CL 105 07/09/2018   CREATININE 0.89 07/09/2018   BUN 17 07/09/2018   CO2 27 07/09/2018   TSH 0.88 07/09/2018    Lab Results  Component Value Date   TSH 0.88 07/09/2018   Lab Results  Component Value Date   WBC 3.5 (L) 07/09/2018   HGB 10.9 (L) 07/09/2018   HCT 35.0 (L) 07/09/2018   MCV 74.0 (L) 07/09/2018   PLT 184.0 07/09/2018   Lab Results  Component Value Date   NA 139 07/09/2018   K 4.4 07/09/2018   CO2 27 07/09/2018   GLUCOSE 90 07/09/2018   BUN 17 07/09/2018    CREATININE 0.89 07/09/2018   BILITOT 0.3 07/09/2018   ALKPHOS 52 07/09/2018   AST 16 07/09/2018   ALT 15 07/09/2018   PROT 7.9 07/09/2018   ALBUMIN 4.4 07/09/2018   CALCIUM 9.5 07/09/2018   CALCIUM 9.3 07/09/2018   GFR 78.33 07/09/2018   Lab Results  Component Value Date   CHOL 205 (H) 07/09/2018   Lab Results  Component Value Date   HDL 90.00 07/09/2018   Lab Results  Component Value Date   LDLCALC 104 (H) 07/09/2018   Lab Results  Component Value Date   TRIG 55.0 07/09/2018   Lab Results  Component Value Date   CHOLHDL 2 07/09/2018   No results found for: HGBA1C     Assessment & Plan:   Problem List Items Addressed This Visit    Rheumatoid arthritis (HCC)    With patient's history of RA and pulmonary disease she is at high risk if she contracts COVID. Her work is considering reopening soon and she is advised that if they will allow her to stay home with a  Doctor's note we can help with that.       Relevant Medications   HYDROcodone-acetaminophen (NORCO/VICODIN) 5-325 MG tablet   GERD (gastroesophageal reflux disease)    Avoid offending foods, start probiotics. Do not eat large meals in late evening and consider raising head of bed.       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 She is given an rx for the Gabapentin 100 mg caps to try bid during the day and then continue the 300 mg cap only qhs.          I have changed Esty Chesmore's gabapentin. I am also having her start on gabapentin. Additionally, I am having her maintain her albuterol, methotrexate, fexofenadine, Symbicort, fluticasone, pantoprazole, famotidine, montelukast, metoprolol succinate,  meloxicam, cyclobenzaprine, Hematinic/Folic Acid, and HYDROcodone-acetaminophen.  Meds ordered this encounter  Medications  . HYDROcodone-acetaminophen (NORCO/VICODIN) 5-325 MG tablet    Sig: Take 1 tablet by mouth every 6 (six) hours as needed for moderate pain.    Dispense:  60 tablet    Refill:  0  . gabapentin (NEURONTIN) 100 MG capsule    Sig: Take 1 capsule (100 mg total) by mouth 2 (two) times daily. Am and noon    Dispense:  60 capsule    Refill:  3    I discussed the assessment and treatment plan with the patient. The patient was provided an opportunity to ask questions and all were answered. The patient agreed with the plan and demonstrated an understanding of the instructions.   The patient was advised to call back or seek an in-person evaluation if the symptoms worsen or if the condition fails to improve as anticipated.  I provided 25 minutes of non-face-to-face time during this encounter.   Danise Edge, MD

## 2018-10-10 NOTE — Assessment & Plan Note (Addendum)
She was started on Gabapentin 300 mg po tid by ENT for cough and headache and while she thinks it has helped her cough some she does not tolerate the daytime doses due to hypersomnolence so she has not been taking the daytime doses. She is given an rx for the Gabapentin 100 mg caps to try bid during the day and then continue the 300 mg cap only qhs.

## 2018-10-10 NOTE — Assessment & Plan Note (Signed)
Avoid offending foods, start probiotics. Do not eat large meals in late evening and consider raising head of bed.  

## 2018-10-10 NOTE — Assessment & Plan Note (Signed)
Continue to monitor

## 2018-11-02 ENCOUNTER — Other Ambulatory Visit: Payer: Self-pay | Admitting: Family Medicine

## 2018-11-12 ENCOUNTER — Other Ambulatory Visit: Payer: Self-pay | Admitting: Family Medicine

## 2018-11-20 ENCOUNTER — Other Ambulatory Visit: Payer: Self-pay | Admitting: Family Medicine

## 2018-11-23 ENCOUNTER — Other Ambulatory Visit: Payer: Self-pay

## 2018-11-23 ENCOUNTER — Other Ambulatory Visit: Payer: Self-pay | Admitting: Family Medicine

## 2018-11-23 DIAGNOSIS — M069 Rheumatoid arthritis, unspecified: Secondary | ICD-10-CM

## 2018-11-23 MED ORDER — HYDROCODONE-ACETAMINOPHEN 5-325 MG PO TABS: 1.0000 | ORAL_TABLET | Freq: Four times a day (QID) | ORAL | 0 refills | Status: DC | PRN

## 2018-11-23 NOTE — Telephone Encounter (Signed)
Medication Refill - Medication: HYDROcodone-acetaminophen (NORCO/VICODIN) 5-325    Has the patient contacted their pharmacy? No. (Agent: If no, request that the patient contact the pharmacy for the refill.) (Agent: If yes, when and what did the pharmacy advise?)  Preferred Pharmacy (with phone number or street name):  [id number] - [place], [place]  [address] [place] [address]  Phone: [phone] Fax: [phone]  Open 24 hours    Agent: Please be advised that RX refills may take up to 3 business days. We ask that you follow-up with your pharmacy.

## 2018-12-04 ENCOUNTER — Other Ambulatory Visit: Payer: Self-pay

## 2018-12-04 ENCOUNTER — Ambulatory Visit: Payer: BC Managed Care – PPO

## 2018-12-07 ENCOUNTER — Other Ambulatory Visit: Payer: Self-pay | Admitting: Family Medicine

## 2018-12-11 ENCOUNTER — Ambulatory Visit: Payer: BC Managed Care – PPO | Admitting: Family Medicine

## 2018-12-12 ENCOUNTER — Other Ambulatory Visit: Payer: Self-pay | Admitting: Family Medicine

## 2018-12-13 ENCOUNTER — Other Ambulatory Visit: Payer: Self-pay

## 2018-12-13 ENCOUNTER — Ambulatory Visit: Payer: BC Managed Care – PPO | Admitting: Family Medicine

## 2018-12-17 ENCOUNTER — Ambulatory Visit (INDEPENDENT_AMBULATORY_CARE_PROVIDER_SITE_OTHER): Payer: BC Managed Care – PPO | Admitting: Family Medicine

## 2018-12-17 ENCOUNTER — Telehealth: Payer: Self-pay | Admitting: *Deleted

## 2018-12-17 ENCOUNTER — Other Ambulatory Visit: Payer: Self-pay

## 2018-12-17 ENCOUNTER — Telehealth: Payer: Self-pay

## 2018-12-17 DIAGNOSIS — R05 Cough: Secondary | ICD-10-CM | POA: Diagnosis not present

## 2018-12-17 DIAGNOSIS — Z20822 Contact with and (suspected) exposure to covid-19: Secondary | ICD-10-CM

## 2018-12-17 DIAGNOSIS — R053 Chronic cough: Secondary | ICD-10-CM

## 2018-12-17 DIAGNOSIS — K219 Gastro-esophageal reflux disease without esophagitis: Secondary | ICD-10-CM | POA: Diagnosis not present

## 2018-12-17 MED ORDER — HYDROCODONE-HOMATROPINE 5-1.5 MG/5ML PO SYRP: 5.0000 mL | ORAL_SOLUTION | Freq: Three times a day (TID) | ORAL | 0 refills | Status: DC | PRN

## 2018-12-17 MED ORDER — DOXYCYCLINE HYCLATE 100 MG PO TABS: 100.0000 mg | ORAL_TABLET | Freq: Two times a day (BID) | ORAL | 0 refills | Status: DC

## 2018-12-17 NOTE — Assessment & Plan Note (Signed)
Has been worsening for over a week. The cough is dry and keeping her up at night. Has responded well to Doxycycline and Hydromet in the past so will prescribe both and have her tested for COVID. She will have to stay out of work and in quarantine until the results are back she is in agreement.

## 2018-12-17 NOTE — Progress Notes (Signed)
Virtual Visit via Phone Note  I connected with Jamie Cox on 12/17/18 at  3:40 PM EDT by a phone enabled telemedicine application and verified that I am speaking with the correct person using two identifiers.  Location: Patient: home Provider: office   I discussed the limitations of evaluation and management by telemedicine and the availability of in person appointments. The patient expressed understanding and agreed to proceed. Crissie Sickles, CMA was able to get the patient set up on a phone visit after being unable to set up a video visit    Subjective:    Patient ID: Jamie Cox, female    DOB: [birth year], 58 y.o.   MRN: [id number]  No chief complaint on file.   HPI Patient is in today for evaluation of a cough. She has trouble with a chronic cough but unfortunately over the past 1-2 weeks it has gotten progressively worse. She is coughing continuously and it is keeping her up at night. She notes fatigue, anorexia and malaise. No fevers or chills. Denies CP/palp/HA/congestion/fevers/GI or GU c/o. Taking meds as prescribed  Past Medical History:  Diagnosis Date  . Allergy   . Asthma   . Atypical chest pain   . Chronic sinusitis    Followed by Dr. Jenne Pane  . Environmental allergies   . GERD (gastroesophageal reflux disease)   . 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 History of chicken pox   . Hypercalcemia 07/19/2015  . Osteoporosis, unspecified    steroid induced  . Personal history of other diseases of circulatory system   . Rheumatoid arthritis(714.0)   . Unspecified deficiency anemia    microcytic  . Urticaria     Past Surgical History:  Procedure Laterality Date  . NASAL SINUS SURGERY  06/2012    Family History  Problem Relation Age of Onset  . Liver cancer Maternal Grandmother   . Hypertension Maternal Grandmother   . Heart attack Maternal Grandmother   . Scleroderma Mother   . Heart disease Mother   . Kidney disease Mother   . Prostate  cancer Father   . Diabetes Father   . Heart disease Father        CHF  . Arthritis Father        s/p hip replacement  . Cancer Father        lung cancer with mets, smoker  . Lung cancer Paternal Uncle   . Coronary artery disease Paternal Grandmother   . Hypertension Paternal Grandmother   . Liver cancer Paternal Grandmother   . Hyperlipidemia Son   . Alzheimer's disease Paternal Grandfather   . Other Sister        Cardiomegaly  . Arthritis Sister   . Heart disease Sister        rare  . Sleep apnea Brother   . Arthritis Daughter   . Lupus Cousin   . Arthritis Cousin   . Colon cancer Neg Hx     Social History   Socioeconomic History  . Marital status: Married    Spouse name: Not on file  . Number of children: 2  . Years of education: Not on file  . Highest education level: Not on file  Occupational History  . Occupation: Data processing manager  Social Needs  . Financial resource strain: Not on file  . Food insecurity    Worry: Not on file    Inability: Not on file  . Transportation needs    Medical: Not on file    Non-medical:  Not on file  Tobacco Use  . Smoking status: Never Smoker  . Smokeless tobacco: Never Used  Substance and Sexual Activity  . Alcohol use: No    Alcohol/week: 0.0 standard drinks  . Drug use: No  . Sexual activity: Not Currently  Lifestyle  . Physical activity    Days per week: Not on file    Minutes per session: Not on file  . Stress: Not on file  Relationships  . Social Herbalist on phone: Not on file    Gets together: Not on file    Attends religious service: Not on file    Active member of club or organization: Not on file    Attends meetings of clubs or organizations: Not on file    Relationship status: Not on file  . Intimate partner violence    Fear of current or ex partner: Not on file    Emotionally abused: Not on file    Physically abused: Not on file    Forced sexual activity: Not on file  Other Topics Concern   . Not on file  Social History Narrative  . Not on file    Outpatient Medications Prior to Visit  Medication Sig Dispense Refill  . albuterol (PROVENTIL HFA;VENTOLIN HFA) 108 (90 Base) MCG/ACT inhaler Inhale 1-2 puffs into the lungs every 6 (six) hours as needed for wheezing or shortness of breath (cough). For shortness of breath/wheezing 3.7 g 1  . cyclobenzaprine (FLEXERIL) 5 MG tablet Take 1 tablet (5 mg total) by mouth 3 (three) times daily as needed for muscle spasms. 20 tablet 0  . famotidine (PEPCID) 40 MG tablet TAKE 1 TABLET BY MOUTH EVERY DAY 30 tablet 0  . fexofenadine (ALLEGRA) 180 MG tablet Take 180 mg by mouth daily.    . fluticasone (FLONASE) 50 MCG/ACT nasal spray USE TWO SPRAY(S) IN EACH NOSTRIL ONCE DAILY 48 g 3  . gabapentin (NEURONTIN) 100 MG capsule Take 1 capsule (100 mg total) by mouth 2 (two) times daily. Am and noon 60 capsule 3  . gabapentin (NEURONTIN) 300 MG capsule Take 1 capsule (300 mg total) by mouth at bedtime.    Marland Kitchen HEMATINIC/FOLIC ACID 277-8 MG TABS TAKE 1 TABLET BY MOUTH EVERY DAY 90 each 1  . HYDROcodone-acetaminophen (NORCO/VICODIN) 5-325 MG tablet Take 1 tablet by mouth every 6 (six) hours as needed for moderate pain. 60 tablet 0  . meloxicam (MOBIC) 7.5 MG tablet TAKE 1 TO 2 TABLETS BY MOUTH ONCE DAILY AS NEEDED FOR PAIN 60 tablet 3  . methotrexate 2.5 MG tablet     . metoprolol succinate (TOPROL-XL) 25 MG 24 hr tablet Take 1 tablet by mouth once daily 30 tablet 0  . montelukast (SINGULAIR) 10 MG tablet TAKE 1 TABLET BY MOUTH ONCE DAILY AT BEDTIME 30 tablet 0  . pantoprazole (PROTONIX) 40 MG tablet Take 1 tablet (40 mg total) by mouth daily. 90 tablet 1  . predniSONE (DELTASONE) 2.5 MG tablet Take 1 tablet by mouth once daily with breakfast 90 tablet 0  . SYMBICORT 80-4.5 MCG/ACT inhaler INHALE 2 PUFFS INTO THE LUNGS TWO TIMES DAILY 1 Inhaler 3   No facility-administered medications prior to visit.     Allergies  Allergen Reactions  . Avelox  [Moxifloxacin Hcl In Nacl] Diarrhea, Nausea And Vomiting and Other (See Comments)    Dizziness, Cold chills   . Bactrim [Sulfamethoxazole-Trimethoprim] Hives  . Sulfamethoxazole-Trimethoprim Hives    Review of Systems  Constitutional: Positive for malaise/fatigue.  Negative for fever.  HENT: Positive for congestion.   Eyes: Negative for blurred vision.  Respiratory: Positive for cough and shortness of breath.   Cardiovascular: Negative for chest pain, palpitations and leg swelling.  Gastrointestinal: Negative for abdominal pain, blood in stool and nausea.  Genitourinary: Negative for dysuria and frequency.  Musculoskeletal: Negative for falls.  Skin: Negative for rash.  Neurological: Negative for dizziness, loss of consciousness and headaches.  Endo/Heme/Allergies: Negative for environmental allergies.  Psychiatric/Behavioral: Negative for depression. The patient is not nervous/anxious.        Objective:    Physical Exam unable to obtain via phone visit  There were no vitals taken for this visit. Wt Readings from Last 3 Encounters:  08/10/18 106 lb 3.2 oz (48.2 kg)  07/09/18 103 lb (46.7 kg)  04/26/18 104 lb 12.8 oz (47.5 kg)    Diabetic Foot Exam - Simple   No data filed     Lab Results  Component Value Date   WBC 3.5 (L) 07/09/2018   HGB 10.9 (L) 07/09/2018   HCT 35.0 (L) 07/09/2018   PLT 184.0 07/09/2018   GLUCOSE 90 07/09/2018   CHOL 205 (H) 07/09/2018   TRIG 55.0 07/09/2018   HDL 90.00 07/09/2018   LDLCALC 104 (H) 07/09/2018   ALT 15 07/09/2018   AST 16 07/09/2018   NA 139 07/09/2018   K 4.4 07/09/2018   CL 105 07/09/2018   CREATININE 0.89 07/09/2018   BUN 17 07/09/2018   CO2 27 07/09/2018   TSH 0.88 07/09/2018    Lab Results  Component Value Date   TSH 0.88 07/09/2018   Lab Results  Component Value Date   WBC 3.5 (L) 07/09/2018   HGB 10.9 (L) 07/09/2018   HCT 35.0 (L) 07/09/2018   MCV 74.0 (L) 07/09/2018   PLT 184.0 07/09/2018   Lab Results   Component Value Date   NA 139 07/09/2018   K 4.4 07/09/2018   CO2 27 07/09/2018   GLUCOSE 90 07/09/2018   BUN 17 07/09/2018   CREATININE 0.89 07/09/2018   BILITOT 0.3 07/09/2018   ALKPHOS 52 07/09/2018   AST 16 07/09/2018   ALT 15 07/09/2018   PROT 7.9 07/09/2018   ALBUMIN 4.4 07/09/2018   CALCIUM 9.5 07/09/2018   CALCIUM 9.3 07/09/2018   GFR 78.33 07/09/2018   Lab Results  Component Value Date   CHOL 205 (H) 07/09/2018   Lab Results  Component Value Date   HDL 90.00 07/09/2018   Lab Results  Component Value Date   LDLCALC 104 (H) 07/09/2018   Lab Results  Component Value Date   TRIG 55.0 07/09/2018   Lab Results  Component Value Date   CHOLHDL 2 07/09/2018   No results found for: HGBA1C     Assessment & Plan:   Problem List Items Addressed This Visit    Chronic cough    Has been worsening for over a week. The cough is dry and keeping her up at night. Has responded well to Doxycycline and Hydromet in the past so will prescribe both and have her tested for COVID. She will have to stay out of work and in quarantine until the results are back she is in agreement.       GERD (gastroesophageal reflux disease)    Avoid offending foods, start probiotics. Do not eat large meals in late evening and consider raising head of bed.          I am having Jamie Cox start on  HYDROcodone-homatropine and doxycycline. I am also having her maintain her albuterol, methotrexate, fexofenadine, Symbicort, fluticasone, pantoprazole, cyclobenzaprine, Hematinic/Folic Acid, gabapentin, gabapentin, predniSONE, meloxicam, montelukast, HYDROcodone-acetaminophen, famotidine, and metoprolol succinate.  Meds ordered this encounter  Medications  . HYDROcodone-homatropine (HYCODAN) 5-1.5 MG/5ML syrup    Sig: Take 5 mLs by mouth every 8 (eight) hours as needed for cough.    Dispense:  120 mL    Refill:  0  . doxycycline (VIBRA-TABS) 100 MG tablet    Sig: Take 1 tablet (100 mg total) by  mouth 2 (two) times daily.    Dispense:  20 tablet    Refill:  0     I discussed the assessment and treatment plan with the patient. The patient was provided an opportunity to ask questions and all were answered. The patient agreed with the plan and demonstrated an understanding of the instructions.   The patient was advised to call back or seek an in-person evaluation if the symptoms worsen or if the condition fails to improve as anticipated.  I provided 15 minutes of non-face-to-face time during this encounter.   Danise EdgeStacey Niley Helbig, MD

## 2018-12-17 NOTE — Telephone Encounter (Signed)
Patient needs to be tested for COVID due to cough per Dr. Charlett Blake

## 2018-12-17 NOTE — Telephone Encounter (Signed)
Par Dr Charlett Blake- patient needs testing: Symptomatic  Left message for patient to call back to schedule testing. Orders have been placed for testing.

## 2018-12-17 NOTE — Assessment & Plan Note (Signed)
Avoid offending foods, start probiotics. Do not eat large meals in late evening and consider raising head of bed.  

## 2018-12-17 NOTE — Telephone Encounter (Signed)
Left message for patient to call back for testing- orders have been placed.

## 2018-12-18 ENCOUNTER — Ambulatory Visit: Payer: Self-pay | Admitting: *Deleted

## 2018-12-18 ENCOUNTER — Other Ambulatory Visit: Payer: BC Managed Care – PPO

## 2018-12-18 DIAGNOSIS — Z20822 Contact with and (suspected) exposure to covid-19: Secondary | ICD-10-CM

## 2018-12-18 NOTE — Telephone Encounter (Signed)
Pt returned a call regarding getting scheduled for COVID-19 testing.   The order has been entered by Valli Glance, RN yesterday. I scheduled pt for today at 3:30 at the [facility] location in [place].   I made her aware to stay in the car and wear a mask.  Insur:  BC/BS

## 2018-12-18 NOTE — Telephone Encounter (Signed)
Closing note, please see other telephone encounter created by nurse. Pt returned call and was scheduled for an appointment

## 2018-12-22 ENCOUNTER — Other Ambulatory Visit: Payer: Self-pay | Admitting: Family Medicine

## 2018-12-22 LAB — NOVEL CORONAVIRUS, NAA: SARS-CoV-2, NAA: NOT DETECTED

## 2018-12-26 ENCOUNTER — Telehealth: Payer: Self-pay

## 2018-12-26 NOTE — Telephone Encounter (Signed)
Copied from [place] [id number]. Topic: General - Other >> Dec 25, 2018  1:48 PM Sheran Luz wrote: Patient requesting return to work note due to covid-19 testing and being out of work.

## 2018-12-26 NOTE — Telephone Encounter (Signed)
Printed letter left message for patient to call the office if she wants the letter mailed or she can pick it up

## 2018-12-26 NOTE — Telephone Encounter (Signed)
Pt would like to pick letter up from office.

## 2018-12-26 NOTE — Telephone Encounter (Signed)
Ok to write her return to work note for neg covid

## 2018-12-27 ENCOUNTER — Telehealth: Payer: Self-pay

## 2018-12-27 ENCOUNTER — Other Ambulatory Visit: Payer: Self-pay

## 2018-12-27 NOTE — Telephone Encounter (Signed)
Copied from [place] [id number]. Topic: Appointment Scheduling - Scheduling Inquiry for Clinic >> Dec 25, 2018  1:51 PM Sheran Luz wrote: Patient calling to schedule prolia injection. Unable to reach office x3.

## 2018-12-27 NOTE — Telephone Encounter (Signed)
Left message on cell phone for patient to schedule prolia injection.

## 2018-12-27 NOTE — Telephone Encounter (Signed)
Patient can pick form up. I have also mailed the form

## 2019-01-01 ENCOUNTER — Ambulatory Visit (INDEPENDENT_AMBULATORY_CARE_PROVIDER_SITE_OTHER): Payer: BC Managed Care – PPO

## 2019-01-01 ENCOUNTER — Other Ambulatory Visit: Payer: Self-pay

## 2019-01-01 DIAGNOSIS — M810 Age-related osteoporosis without current pathological fracture: Secondary | ICD-10-CM

## 2019-01-01 MED ORDER — DENOSUMAB 60 MG/ML ~~LOC~~ SOSY
60.0000 mg | PREFILLED_SYRINGE | Freq: Once | SUBCUTANEOUS | Status: AC
  Administered 2019-01-01: 60 mg via SUBCUTANEOUS

## 2019-01-01 NOTE — Progress Notes (Addendum)
Pre visit review using our clinic review tool, if applicable. No additional management support is needed unless otherwise documented below in the visit note.  Patient here today for prolia injection. 63mL prolia injected in patients right arm SQ. Patient tolerated well.   However patient states she still has her cough. Patient was prescribed doxycycline it looks like but she states she had to stop taking it due to side effects. She states in the past she has tried cipro and this has cleared it up. Routed to pcp for advise.   Nursing note reviewed. Agree with documention and plan. Will reach out to patient to have a follow up visit with provider

## 2019-01-08 ENCOUNTER — Other Ambulatory Visit: Payer: Self-pay | Admitting: Family Medicine

## 2019-01-10 ENCOUNTER — Other Ambulatory Visit: Payer: Self-pay | Admitting: Medical

## 2019-01-10 ENCOUNTER — Other Ambulatory Visit: Payer: Self-pay | Admitting: Family Medicine

## 2019-01-10 DIAGNOSIS — R05 Cough: Secondary | ICD-10-CM

## 2019-01-10 DIAGNOSIS — R053 Chronic cough: Secondary | ICD-10-CM

## 2019-01-11 ENCOUNTER — Other Ambulatory Visit: Payer: Self-pay | Admitting: Family Medicine

## 2019-01-11 DIAGNOSIS — R05 Cough: Secondary | ICD-10-CM

## 2019-01-11 DIAGNOSIS — M069 Rheumatoid arthritis, unspecified: Secondary | ICD-10-CM

## 2019-01-11 DIAGNOSIS — R053 Chronic cough: Secondary | ICD-10-CM

## 2019-01-11 MED ORDER — ALBUTEROL SULFATE HFA 108 (90 BASE) MCG/ACT IN AERS: INHALATION_SPRAY | RESPIRATORY_TRACT | 5 refills | Status: DC

## 2019-01-11 MED ORDER — HYDROCODONE-ACETAMINOPHEN 5-325 MG PO TABS: 1.0000 | ORAL_TABLET | Freq: Four times a day (QID) | ORAL | 0 refills | Status: DC | PRN

## 2019-01-11 NOTE — Telephone Encounter (Signed)
Medication Refill - Medication: HYDROcodone-acetaminophen (NORCO/VICODIN) 5-325 MG tablet   albuterol (PROVENTIL HFA;VENTOLIN HFA) 108 (90 Base) MCG/ACT inhaler   Has the patient contacted their pharmacy? No. (Agent: If no, request that the patient contact the pharmacy for the refill.) (Agent: If yes, when and what did the pharmacy advise?)  Preferred Pharmacy (with phone number or street name):  [id number] - [place], [place] [phone] (Phone) [phone] (Fax)     Agent: Please be advised that RX refills may take up to 3 business days. We ask that you follow-up with your pharmacy.

## 2019-01-26 ENCOUNTER — Other Ambulatory Visit: Payer: Self-pay | Admitting: Family Medicine

## 2019-01-29 ENCOUNTER — Other Ambulatory Visit: Payer: Self-pay | Admitting: Family Medicine

## 2019-02-13 ENCOUNTER — Other Ambulatory Visit: Payer: Self-pay | Admitting: Family Medicine

## 2019-02-13 ENCOUNTER — Telehealth: Payer: Self-pay | Admitting: Family Medicine

## 2019-02-13 DIAGNOSIS — M069 Rheumatoid arthritis, unspecified: Secondary | ICD-10-CM

## 2019-02-13 MED ORDER — HYDROCODONE-ACETAMINOPHEN 5-325 MG PO TABS: 1.0000 | ORAL_TABLET | Freq: Four times a day (QID) | ORAL | 0 refills | Status: DC | PRN

## 2019-02-13 NOTE — Telephone Encounter (Signed)
RX REFILL HYDROcodone-acetaminophen (NORCO/VICODIN) 5-325  metoprolol succinate (TOPROL-XL) 25 MG 24 hr tablet  [place] ([place]), [place] - [address] [phone] (Phone) [phone] (Fax

## 2019-02-13 NOTE — Telephone Encounter (Signed)
Last OV 12/17/18 Hydrocodone last filled 01/11/19 #60 with 0

## 2019-02-13 NOTE — Telephone Encounter (Signed)
I have sent in refill please let her know

## 2019-02-14 NOTE — Telephone Encounter (Signed)
Called and left a detailed message to advise pt.  

## 2019-02-23 ENCOUNTER — Other Ambulatory Visit: Payer: Self-pay | Admitting: Family Medicine

## 2019-03-05 ENCOUNTER — Other Ambulatory Visit: Payer: Self-pay | Admitting: Family Medicine

## 2019-03-15 ENCOUNTER — Other Ambulatory Visit: Payer: Self-pay | Admitting: Family Medicine

## 2019-03-18 ENCOUNTER — Other Ambulatory Visit: Payer: Self-pay | Admitting: Family Medicine

## 2019-03-25 ENCOUNTER — Other Ambulatory Visit: Payer: Self-pay | Admitting: Family Medicine

## 2019-03-29 ENCOUNTER — Other Ambulatory Visit: Payer: Self-pay | Admitting: Family Medicine

## 2019-03-29 DIAGNOSIS — M069 Rheumatoid arthritis, unspecified: Secondary | ICD-10-CM

## 2019-03-29 NOTE — Telephone Encounter (Signed)
Medication Refill - Medication: HYDROcodone-acetaminophen (NORCO/VICODIN) 5-325 MG tablet    Preferred [id number] - [place], [place] - [address] AT [place]  [address] [place] [address]  Phone: [phone] Fax: [phone]   Otho Ket (with phone number or street name):   Agent: Please be advised that RX refills may take up to 3 business days. We ask that you follow-up with your pharmacy.

## 2019-03-29 NOTE — Telephone Encounter (Signed)
Requesting:Norco Contract:yes UDS:n/a Last OV:12/17/18 Next OV:n/a Last Refill:9//20 v#60- Database:   Please advise

## 2019-03-31 MED ORDER — HYDROCODONE-ACETAMINOPHEN 5-325 MG PO TABS: 1.0000 | ORAL_TABLET | Freq: Four times a day (QID) | ORAL | 0 refills | Status: DC | PRN

## 2019-04-15 ENCOUNTER — Other Ambulatory Visit: Payer: Self-pay | Admitting: Family Medicine

## 2019-04-18 ENCOUNTER — Other Ambulatory Visit: Payer: Self-pay | Admitting: Family Medicine

## 2019-04-22 ENCOUNTER — Ambulatory Visit (INDEPENDENT_AMBULATORY_CARE_PROVIDER_SITE_OTHER): Payer: BC Managed Care – PPO | Admitting: Family Medicine

## 2019-04-22 ENCOUNTER — Other Ambulatory Visit: Payer: Self-pay

## 2019-04-22 ENCOUNTER — Encounter: Payer: Self-pay | Admitting: Family Medicine

## 2019-04-22 DIAGNOSIS — J208 Acute bronchitis due to other specified organisms: Secondary | ICD-10-CM

## 2019-04-22 DIAGNOSIS — B9689 Other specified bacterial agents as the cause of diseases classified elsewhere: Secondary | ICD-10-CM | POA: Diagnosis not present

## 2019-04-22 MED ORDER — BENZONATATE 100 MG PO CAPS: 100.0000 mg | ORAL_CAPSULE | Freq: Three times a day (TID) | ORAL | 0 refills | Status: DC | PRN

## 2019-04-22 MED ORDER — AZITHROMYCIN 250 MG PO TABS: ORAL_TABLET | ORAL | 0 refills | Status: DC

## 2019-04-22 NOTE — Progress Notes (Signed)
Chief Complaint  Patient presents with  . Cough    a couple weeks  . Diarrhea    Balinda Quails here for URI complaints.  Duration: 3 weeks cough; diarrhea Associated symptoms: rhinorrhea, chest pain, diarrhea over weekend but not anymore, and cough that is worsening Denies: sinus congestion, sinus pain, itchy watery eyes, ear pain, ear drainage, sore throat, wheezing, shortness of breath, myalgia and fevers Treatment to date: normal meds for chronic cough Sick contacts: No  ROS:  Const: Denies fevers HEENT: As noted in HPI Lungs: No SOB  Past Medical History:  Diagnosis Date  . Allergy   . Asthma   . Atypical chest pain   . Chronic sinusitis    Followed by Dr. Redmond Baseman  . Environmental allergies   . GERD (gastroesophageal reflux disease)   . 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 History of chicken pox   . Hypercalcemia 07/19/2015  . Osteoporosis, unspecified    steroid induced  . Personal history of other diseases of circulatory system   . Rheumatoid arthritis(714.0)   . Unspecified deficiency anemia    microcytic  . Urticaria    Exam No conversational dyspnea Age appropriate judgment and insight Nml affect and mood  Acute bacterial bronchitis - Plan: azithromycin (ZITHROMAX) 250 MG tablet, benzonatate (TESSALON) 100 MG capsule  Orders as above. Continue to push fluids, practice good hand hygiene, cover mouth when coughing. F/u prn. If starting to experience fevers, shaking, or shortness of breath, seek immediate care. Total time: 11 min Pt voiced understanding and agreement to the plan.  [place], DO 04/22/19 2:29 PM

## 2019-05-06 ENCOUNTER — Other Ambulatory Visit: Payer: Self-pay | Admitting: Family Medicine

## 2019-05-27 ENCOUNTER — Other Ambulatory Visit: Payer: Self-pay | Admitting: Family Medicine

## 2019-06-14 ENCOUNTER — Other Ambulatory Visit: Payer: Self-pay | Admitting: Family Medicine

## 2019-07-09 ENCOUNTER — Other Ambulatory Visit: Payer: Self-pay | Admitting: Family Medicine

## 2019-07-12 ENCOUNTER — Telehealth: Payer: Self-pay | Admitting: Family Medicine

## 2019-07-12 ENCOUNTER — Other Ambulatory Visit: Payer: Self-pay | Admitting: Family Medicine

## 2019-07-12 MED ORDER — HYDROCODONE-ACETAMINOPHEN 5-325 MG PO TABS: 1.0000 | ORAL_TABLET | Freq: Four times a day (QID) | ORAL | 0 refills | Status: DC | PRN

## 2019-07-12 NOTE — Telephone Encounter (Signed)
Medication: HYDROcodone-acetaminophen (NORCO/VICODIN) 5-325 MG tablet   Has the patient contacted their pharmacy? No. (If no, request that the patient contact the pharmacy for the refill.) (If yes, when and what did the pharmacy advise?)  Preferred Pharmacy (with phone number or street name): [id number] - [place] ([place]), [place] - [address]  [address], [place] ([place]) [place] [address]  Phone:  [phone] Fax:  [phone]  Agent: Please be advised that RX refills may take up to 3 business days. We ask that you follow-up with your pharmacy.

## 2019-07-12 NOTE — Telephone Encounter (Signed)
Last OV 12/17/18 Checked controlled substance database, last filled 03/31/19.

## 2019-07-12 NOTE — Telephone Encounter (Signed)
Sent to pharmacy 

## 2019-07-15 ENCOUNTER — Other Ambulatory Visit: Payer: Self-pay

## 2019-07-15 ENCOUNTER — Ambulatory Visit (INDEPENDENT_AMBULATORY_CARE_PROVIDER_SITE_OTHER): Payer: BC Managed Care – PPO | Admitting: Medical

## 2019-07-15 ENCOUNTER — Other Ambulatory Visit: Payer: Self-pay | Admitting: Family Medicine

## 2019-07-15 ENCOUNTER — Encounter: Payer: Self-pay | Admitting: Medical

## 2019-07-15 VITALS — Ht 61.0 in | Wt 108.0 lb

## 2019-07-15 DIAGNOSIS — J479 Bronchiectasis, uncomplicated: Secondary | ICD-10-CM | POA: Diagnosis not present

## 2019-07-15 DIAGNOSIS — J328 Other chronic sinusitis: Secondary | ICD-10-CM

## 2019-07-15 DIAGNOSIS — Z20822 Contact with and (suspected) exposure to covid-19: Secondary | ICD-10-CM | POA: Diagnosis not present

## 2019-07-15 MED ORDER — AZITHROMYCIN 250 MG PO TABS: ORAL_TABLET | ORAL | 0 refills | Status: DC

## 2019-07-15 MED ORDER — FLUTICASONE PROPIONATE 50 MCG/ACT NA SUSP: 2.0000 | Freq: Every day | NASAL | 1 refills | Status: DC

## 2019-07-15 MED ORDER — HYDROCODONE-HOMATROPINE 5-1.5 MG/5ML PO SYRP: 5.0000 mL | ORAL_SOLUTION | Freq: Four times a day (QID) | ORAL | 0 refills | Status: DC | PRN

## 2019-07-15 NOTE — Patient Instructions (Addendum)
Concern for sinus infection and also have underlying bronchiectasis.   Will rx azithromycin antibiotic, flonase nasal spray for nasal congestion and hycodan for cough. Rx advisment given on hycodan. DC any norco now. Pt expressed understanding/aware as has used hycodan in past.  Rest, hydrate, stay at home/quarantine and get tested for covid.  Follow up in 2 weeks or as needed

## 2019-07-15 NOTE — Progress Notes (Signed)
Subjective:    Patient ID: Jamie Cox, female    DOB: 06/13/1960, 59 y.o.   MRN: [id number]  HPI  Virtual Visit via Telephone Note  I connected with [name] on 07/15/19 at 11:20 AM EST by telephone and verified that I am speaking with the correct person using two identifiers.  Location: Patient: home Provider: office   I discussed the limitations, risks, security and privacy concerns of performing an evaluation and management service by telephone and the availability of in person appointments. I also discussed with the patient that there may be a patient responsible charge related to this service. The patient expressed understanding and agreed to proceed.  No vitals checked.   History of Present Illness:  Pt states she has had cough for about one month. Cough has been dry. Some nasal congestion but no sinus pressure. She does not history of sinus infection and when she blows nose some dark brown.   Pt states over past week noticing dark brown mucus.   No fever, no chills, no sweats or body aches.  Pt also has history of bronchiectasis.   Pt states she works at [employer] and exposed to cleaning products.  She has rx of norco but has not taken recently since not working.     Observations/Objective:  General- no acute distress, pleasant, alert and oriented. Normal speech.    Assessment and Plan:  Concern for sinus infection and also have underlying bronchiectasis.   Will rx azithromycin antibiotic, flonase nasal spray for nasal congestion and hycodan for cough. Rx advisment given on hycodan. DC any norco now. Pt expressed understanding/aware as has used hycodan in past.  Rest, hydrate, stay at home/quarantine and get tested for covid.  Follow up in 2 weeks or as needed Follow Up Instructions:    I discussed the assessment and treatment plan with the patient. The patient was provided an opportunity to ask questions and all were answered. The patient agreed with  the plan and demonstrated an understanding of the instructions.   The patient was advised to call back or seek an in-person evaluation if the symptoms worsen or if the condition fails to improve as anticipated.  I provided 30+  minutes of non-face-to-face time during this encounter. 50% of time time spent counseling on plan going forward.   [name], PA-C   Review of Systems  Constitutional: Negative for chills.  HENT: Positive for congestion and sinus pressure. Negative for hearing loss, sinus pain, sneezing and sore throat.   Respiratory: Positive for cough. Negative for shortness of breath and wheezing.   Cardiovascular: Negative for chest pain and palpitations.  Gastrointestinal: Negative for abdominal pain.  Musculoskeletal: Negative for back pain.  Neurological: Negative for dizziness, weakness, numbness and headaches.  Hematological: Negative for adenopathy. Does not bruise/bleed easily.  Psychiatric/Behavioral: Negative for behavioral problems and confusion. The patient is not nervous/anxious.     Past Medical History:  Diagnosis Date  . Allergy   . Asthma   . Atypical chest pain   . Chronic sinusitis    Followed by Dr. Jenne Pane  . Environmental allergies   . GERD (gastroesophageal reflux disease)   . 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 History of chicken pox   . Hypercalcemia 07/19/2015  . Osteoporosis, unspecified    steroid induced  . Personal history of other diseases of circulatory system   . Rheumatoid arthritis(714.0)   . Unspecified deficiency anemia    microcytic  . Urticaria  Social History   Socioeconomic History  . Marital status: Married    Spouse name: Not on file  . Number of children: 2  . Years of education: Not on file  . Highest education level: Not on file  Occupational History  . Occupation: Building control surveyor  Tobacco Use  . Smoking status: Never Smoker  . Smokeless tobacco: Never Used  Substance and Sexual  Activity  . Alcohol use: No    Alcohol/week: 0.0 standard drinks  . Drug use: No  . Sexual activity: Not Currently  Other Topics Concern  . Not on file  Social History Narrative  . Not on file   Social Determinants of Health   Financial Resource Strain:   . Difficulty of Paying Living Expenses: Not on file  Food Insecurity:   . Worried About Charity fundraiser in the Last Year: Not on file  . Ran Out of Food in the Last Year: Not on file  Transportation Needs:   . Lack of Transportation (Medical): Not on file  . Lack of Transportation (Non-Medical): Not on file  Physical Activity:   . Days of Exercise per Week: Not on file  . Minutes of Exercise per Session: Not on file  Stress:   . Feeling of Stress : Not on file  Social Connections:   . Frequency of Communication with Friends and Family: Not on file  . Frequency of Social Gatherings with Friends and Family: Not on file  . Attends Religious Services: Not on file  . Active Member of Clubs or Organizations: Not on file  . Attends Archivist Meetings: Not on file  . Marital Status: Not on file  Intimate Partner Violence:   . Fear of Current or Ex-Partner: Not on file  . Emotionally Abused: Not on file  . Physically Abused: Not on file  . Sexually Abused: Not on file    Past Surgical History:  Procedure Laterality Date  . NASAL SINUS SURGERY  06/2012    Family History  Problem Relation Age of Onset  . Liver cancer Maternal Grandmother   . Hypertension Maternal Grandmother   . Heart attack Maternal Grandmother   . Scleroderma Mother   . Heart disease Mother   . Kidney disease Mother   . Prostate cancer Father   . Diabetes Father   . Heart disease Father        CHF  . Arthritis Father        s/p hip replacement  . Cancer Father        lung cancer with mets, smoker  . Lung cancer Paternal Uncle   . Coronary artery disease Paternal Grandmother   . Hypertension Paternal Grandmother   . Liver cancer  Paternal Grandmother   . Hyperlipidemia Son   . Alzheimer's disease Paternal Grandfather   . Other Sister        Cardiomegaly  . Arthritis Sister   . Heart disease Sister        rare  . Sleep apnea Brother   . Arthritis Daughter   . Lupus Cousin   . Arthritis Cousin   . Colon cancer Neg Hx     Allergies  Allergen Reactions  . Avelox [Moxifloxacin Hcl In Nacl] Diarrhea, Nausea And Vomiting and Other (See Comments)    Dizziness, Cold chills   . Bactrim [Sulfamethoxazole-Trimethoprim] Hives  . Sulfamethoxazole-Trimethoprim Hives    Current Outpatient Medications on File Prior to Visit  Medication Sig Dispense Refill  . cyclobenzaprine (FLEXERIL)  5 MG tablet Take 1 tablet (5 mg total) by mouth 3 (three) times daily as needed for muscle spasms. 20 tablet 0  . famotidine (PEPCID) 40 MG tablet TAKE 1 TABLET BY MOUTH EVERY DAY 30 tablet 0  . fexofenadine (ALLEGRA) 180 MG tablet Take 180 mg by mouth daily.    Marland Kitchen gabapentin (NEURONTIN) 100 MG capsule TAKE 1 CAPSULE BY MOUTH TWICE DAILY AM  AND  NOON 60 capsule 2  . gabapentin (NEURONTIN) 300 MG capsule Take 1 capsule (300 mg total) by mouth at bedtime.    Marland Kitchen HEMATINIC/FOLIC ACID 324-1 MG TABS TAKE 1 TABLET BY MOUTH EVERY DAY 90 tablet 1  . HYDROcodone-acetaminophen (NORCO/VICODIN) 5-325 MG tablet Take 1 tablet by mouth every 6 (six) hours as needed for moderate pain. 60 tablet 0  . HYDROcodone-homatropine (HYCODAN) 5-1.5 MG/5ML syrup Take 5 mLs by mouth every 8 (eight) hours as needed for cough. 120 mL 0  . meloxicam (MOBIC) 7.5 MG tablet TAKE 1 TO 2 TABLETS BY MOUTH ONCE DAILY AS NEEDED FOR PAIN 60 tablet 3  . methotrexate 2.5 MG tablet     . metoprolol succinate (TOPROL-XL) 25 MG 24 hr tablet Take 1 tablet by mouth once daily 30 tablet 0  . montelukast (SINGULAIR) 10 MG tablet TAKE 1 TABLET BY MOUTH ONCE DAILY AT BEDTIME 90 tablet 1  . pantoprazole (PROTONIX) 40 MG tablet Take 1 tablet (40 mg total) by mouth daily. 90 tablet 1  .  predniSONE (DELTASONE) 2.5 MG tablet Take 1 tablet by mouth once daily with breakfast 90 tablet 0  . SYMBICORT 80-4.5 MCG/ACT inhaler INHALE 2 PUFFS INTO THE LUNGS TWO TIMES DAILY 1 Inhaler 3  . albuterol (VENTOLIN HFA) 108 (90 Base) MCG/ACT inhaler INHALE ONE TO TWO PUFFS INTO THE LUNGS EVERY 6 HOURS AS NEEDED FOR WHEEZING OR SHORTNESS OF BREATH (Patient not taking: Reported on 07/15/2019) 18 g 5  . fluticasone (FLONASE) 50 MCG/ACT nasal spray USE TWO SPRAY(S) IN EACH NOSTRIL ONCE DAILY (Patient not taking: Reported on 07/15/2019) 48 g 3   No current facility-administered medications on file prior to visit.    Ht 5\' 1"  (1.549 m)   Wt 108 lb (49 kg)   BMI 20.41 kg/m       Objective:   Physical Exam        Assessment & Plan:

## 2019-07-16 ENCOUNTER — Ambulatory Visit: Payer: BC Managed Care – PPO | Attending: Internal Medicine

## 2019-07-16 DIAGNOSIS — Z20822 Contact with and (suspected) exposure to covid-19: Secondary | ICD-10-CM

## 2019-07-17 LAB — NOVEL CORONAVIRUS, NAA: SARS-CoV-2, NAA: NOT DETECTED

## 2019-07-19 ENCOUNTER — Telehealth: Payer: Self-pay

## 2019-07-19 NOTE — Telephone Encounter (Signed)
Patient called in needing Dr. Ria Bush to send over a work excuse to the patient my chart as soon as possible the patient stated she can return back to work on today 07/19/2019   Thanks,

## 2019-07-19 NOTE — Telephone Encounter (Signed)
Patient called in to see if the nurse or Dr. Abner Greenspan can give her a follow up call to discuss her test results. Please call the patient at [phone]   Thanks,

## 2019-07-22 NOTE — Telephone Encounter (Signed)
Are you ok with her returning to work is her symptoms have resolved?

## 2019-07-23 NOTE — Telephone Encounter (Signed)
Pt was made aware of lab result by [facility] calling her/thx dmf

## 2019-07-23 NOTE — Telephone Encounter (Signed)
I just saw this and got to review. Sent on Friday. Was busy all day. I guess she is completely better and thus asking to returrn to work. Message she sent did not specify. If she is recovered then will you write note for her.   Sorry for the delay but super busy and think I saw this for 1st time yesterday. Could not respond immediatley as was with patients during the day. And with covid just can't put people back to work to quickly unless test negative or symptoms bascially get better with treatment.

## 2019-07-24 NOTE — Telephone Encounter (Signed)
Patient has been taken care of and has returned to work.

## 2019-07-29 ENCOUNTER — Other Ambulatory Visit: Payer: Self-pay | Admitting: Family Medicine

## 2019-08-05 ENCOUNTER — Other Ambulatory Visit: Payer: Self-pay | Admitting: Family Medicine

## 2019-08-14 ENCOUNTER — Other Ambulatory Visit: Payer: Self-pay | Admitting: Family Medicine

## 2019-08-30 ENCOUNTER — Telehealth: Payer: Self-pay | Admitting: Family Medicine

## 2019-08-30 ENCOUNTER — Ambulatory Visit: Payer: BC Managed Care – PPO | Attending: Internal Medicine

## 2019-08-30 DIAGNOSIS — Z20822 Contact with and (suspected) exposure to covid-19: Secondary | ICD-10-CM

## 2019-08-30 NOTE — Telephone Encounter (Signed)
Medication: Hydrocodone Has the patient contacted their pharmacy? No. (If no, request that the patient contact the pharmacy for the refill.) (If yes, when and what did the pharmacy advise?)  Preferred Pharmacy (with phone number or street name): [id number] - [place] ([place]), [place] - [address]  [address] [place] ([place]) [place] [address]  Phone:  [phone] Fax:  [phone]  DEA #:  --  Agent: Please be advised that RX refills may take up to 3 business days. We ask that you follow-up with your pharmacy.

## 2019-08-30 NOTE — Telephone Encounter (Signed)
Last written: 07/12/19 Last ov: 12/17/18 Next ov: none Contract: none UDS: 07/09/18  I called patient because the hydrocodone was taken off her list at that time I advised her that she may need an ov since you have not seen her in a while and usually we like to see you every 3 months.

## 2019-08-31 LAB — NOVEL CORONAVIRUS, NAA: SARS-CoV-2, NAA: NOT DETECTED

## 2019-08-31 LAB — SARS-COV-2, NAA 2 DAY TAT

## 2019-09-01 ENCOUNTER — Other Ambulatory Visit: Payer: Self-pay | Admitting: Family Medicine

## 2019-09-01 MED ORDER — HYDROCODONE-ACETAMINOPHEN 5-325 MG PO TABS: 1.0000 | ORAL_TABLET | Freq: Four times a day (QID) | ORAL | 0 refills | Status: DC | PRN

## 2019-09-01 NOTE — Telephone Encounter (Signed)
I sent in a refill on the Norco but she needs an appointment for any further refills.

## 2019-09-02 NOTE — Telephone Encounter (Signed)
Patient notified and she will call back to make appointment

## 2019-09-05 ENCOUNTER — Other Ambulatory Visit: Payer: Self-pay | Admitting: Family Medicine

## 2019-09-08 ENCOUNTER — Other Ambulatory Visit: Payer: Self-pay | Admitting: Family Medicine

## 2019-09-11 ENCOUNTER — Ambulatory Visit: Payer: BC Managed Care – PPO | Attending: Internal Medicine

## 2019-09-11 DIAGNOSIS — Z20822 Contact with and (suspected) exposure to covid-19: Secondary | ICD-10-CM

## 2019-09-12 LAB — NOVEL CORONAVIRUS, NAA: SARS-CoV-2, NAA: DETECTED — AB

## 2019-09-12 LAB — SARS-COV-2, NAA 2 DAY TAT

## 2019-09-13 ENCOUNTER — Telehealth: Payer: Self-pay | Admitting: Physician Assistant

## 2019-09-13 NOTE — Telephone Encounter (Signed)
Called to discuss with Jamie Cox about Covid symptoms and the use of bamlanivimab/etesevimab or casirivimab/imdevimab, a monoclonal antibody infusion for those with mild to moderate Covid symptoms and at a high risk of hospitalization.     Pt is qualified for this infusion at the [facility] infusion center due to co-morbid conditions and/or a member of an at-risk group (athma/ RA), however is asymptomatic at this time. She had the J&J vaccine about 3 weeks ago. She got tested bc her daughter was positive. Symptoms tier reviewed as well as criteria for ending isolation.  Symptoms reviewed that would warrant ED/Hospital evaluation. Preventative practices reviewed. Patient verbalized understanding. Patient advised to call back if he decides that he does want to get infusion. Callback number to the infusion center given. Patient advised to go to Urgent care or ED with severe symptoms. Sent Mychart message with more information in the case that she goes on to develop worsening symptoms.    Patient Active Problem List   Diagnosis Date Noted  . Atypical chest pain 08/09/2017  . Onychomycosis 05/08/2017  . Cough variant asthma with possible UACS component  05/05/2017  . Asthma 02/13/2017  . Bronchiectasis without acute exacerbation (HCC) 06/29/2016  . 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 Cervical cancer screening 03/04/2016  . Hypercalcemia 07/19/2015  . History of chicken pox   . Intercostal muscle pain 09/20/2013  . Preventative health care 08/06/2013  . GERD (gastroesophageal reflux disease) 08/01/2012  . Tachycardia 07/28/2012  . Health care maintenance 01/31/2012  . Chronic sinusitis 01/31/2012  . 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 Chronic cough 09/06/2010  . Rheumatoid arthritis (HCC) 05/17/2007  . Anemia 06/23/2006  . Osteoporosis 06/23/2006    Cline Crock PA-C

## 2019-09-15 ENCOUNTER — Other Ambulatory Visit: Payer: Self-pay | Admitting: Family Medicine

## 2019-09-19 ENCOUNTER — Other Ambulatory Visit: Payer: Self-pay

## 2019-09-19 ENCOUNTER — Ambulatory Visit (INDEPENDENT_AMBULATORY_CARE_PROVIDER_SITE_OTHER): Payer: BC Managed Care – PPO | Admitting: Family Medicine

## 2019-09-19 DIAGNOSIS — R05 Cough: Secondary | ICD-10-CM

## 2019-09-19 DIAGNOSIS — J45991 Cough variant asthma: Secondary | ICD-10-CM | POA: Diagnosis not present

## 2019-09-19 DIAGNOSIS — U071 COVID-19: Secondary | ICD-10-CM | POA: Diagnosis not present

## 2019-09-19 DIAGNOSIS — R053 Chronic cough: Secondary | ICD-10-CM

## 2019-09-19 MED ORDER — CIPROFLOXACIN HCL 500 MG PO TABS: 500.0000 mg | ORAL_TABLET | Freq: Two times a day (BID) | ORAL | 0 refills | Status: DC

## 2019-09-19 MED ORDER — GABAPENTIN 300 MG PO CAPS: 300.0000 mg | ORAL_CAPSULE | Freq: Three times a day (TID) | ORAL | 3 refills | Status: DC

## 2019-09-19 NOTE — Patient Instructions (Signed)
[facility] Blood Pressure cuff, upper arm, want BP 100-140/60-90 Pulse oximeter, want oxygen in 90s  Weekly vitals  Take Multivitamin with minerals, selenium Vitamin D 1000-2000 IU daily Probiotic with lactobacillus and bifidophilus Asprin EC 81 mg daily  Melatonin 2-5 mg at bedtime  [email or web address]  COVID-19 COVID-19 is a respiratory infection that is caused by a virus called severe acute respiratory syndrome coronavirus 2 (SARS-CoV-2). The disease is also known as coronavirus disease or novel coronavirus. In some people, the virus may not cause any symptoms. In others, it may cause a serious infection. The infection can get worse quickly and can lead to complications, such as:  Pneumonia, or infection of the lungs.  Acute respiratory distress syndrome or ARDS. This is a condition in which fluid build-up in the lungs prevents the lungs from filling with air and passing oxygen into the blood.  Acute respiratory failure. This is a condition in which there is not enough oxygen passing from the lungs to the body or when carbon dioxide is not passing from the lungs out of the body.  Sepsis or septic shock. This is a serious bodily reaction to an infection.  Blood clotting problems.  Secondary infections due to bacteria or fungus.  Organ failure. This is when your body's organs stop working. The virus that causes COVID-19 is contagious. This means that it can spread from person to person through droplets from coughs and sneezes (respiratory secretions). What are the causes? This illness is caused by a virus. You may catch the virus by:  Breathing in droplets from an infected person. Droplets can be spread by a person breathing, speaking, singing, coughing, or sneezing.  Touching something, like a table or a doorknob, that was exposed to the virus (contaminated) and then touching your mouth, nose, or eyes. What increases the risk? Risk for  infection You are more likely to be infected with this virus if you:  Are within 6 feet (2 meters) of a person with COVID-19.  Provide care for or live with a person who is infected with COVID-19.  Spend time in crowded indoor spaces or live in shared housing. Risk for serious illness You are more likely to become seriously ill from the virus if you:  Are 13 years of age or older. The higher your age, the more you are at risk for serious illness.  Live in a nursing home or long-term care facility.  Have cancer.  Have a long-term (chronic) disease such as: ? Chronic lung disease, including chronic obstructive pulmonary disease or asthma. ? A long-term disease that lowers your body's ability to fight infection (immunocompromised). ? Heart disease, including heart failure, a condition in which the arteries that lead to the heart become narrow or blocked (coronary artery disease), a disease which makes the heart muscle thick, weak, or stiff (cardiomyopathy). ? Diabetes. ? Chronic kidney disease. ? Sickle cell disease, a condition in which red blood cells have an abnormal "sickle" shape. ? Liver disease.  Are obese. What are the signs or symptoms? Symptoms of this condition can range from mild to severe. Symptoms may appear any time from 2 to 14 days after being exposed to the virus. They include:  A fever or chills.  A cough.  Difficulty breathing.  Headaches, body aches, or muscle aches.  Runny or stuffy (congested) nose.  A sore throat.  New loss of taste or smell. Some people may also have stomach problems, such as nausea, vomiting, or diarrhea. Other people may not  have any symptoms of COVID-19. How is this diagnosed? This condition may be diagnosed based on:  Your signs and symptoms, especially if: ? You live in an area with a COVID-19 outbreak. ? You recently traveled to or from an area where the virus is common. ? You provide care for or live with a person who  was diagnosed with COVID-19. ? You were exposed to a person who was diagnosed with COVID-19.  A physical exam.  Lab tests, which may include: ? Taking a sample of fluid from the back of your nose and throat (nasopharyngeal fluid), your nose, or your throat using a swab. ? A sample of mucus from your lungs (sputum). ? Blood tests.  Imaging tests, which may include, X-rays, CT scan, or ultrasound. How is this treated? At present, there is no medicine to treat COVID-19. Medicines that treat other diseases are being used on a trial basis to see if they are effective against COVID-19. Your health care provider will talk with you about ways to treat your symptoms. For most people, the infection is mild and can be managed at home with rest, fluids, and over-the-counter medicines. Treatment for a serious infection usually takes places in a hospital intensive care unit (ICU). It may include one or more of the following treatments. These treatments are given until your symptoms improve.  Receiving fluids and medicines through an IV.  Supplemental oxygen. Extra oxygen is given through a tube in the nose, a face mask, or a hood.  Positioning you to lie on your stomach (prone position). This makes it easier for oxygen to get into the lungs.  Continuous positive airway pressure (CPAP) or bi-level positive airway pressure (BPAP) machine. This treatment uses mild air pressure to keep the airways open. A tube that is connected to a motor delivers oxygen to the body.  Ventilator. This treatment moves air into and out of the lungs by using a tube that is placed in your windpipe.  Tracheostomy. This is a procedure to create a hole in the neck so that a breathing tube can be inserted.  Extracorporeal membrane oxygenation (ECMO). This procedure gives the lungs a chance to recover by taking over the functions of the heart and lungs. It supplies oxygen to the body and removes carbon dioxide. Follow these  instructions at home: Lifestyle  If you are sick, stay home except to get medical care. Your health care provider will tell you how long to stay home. Call your health care provider before you go for medical care.  Rest at home as told by your health care provider.  Do not use any products that contain nicotine or tobacco, such as cigarettes, e-cigarettes, and chewing tobacco. If you need help quitting, ask your health care provider.  Return to your normal activities as told by your health care provider. Ask your health care provider what activities are safe for you. General instructions  Take over-the-counter and prescription medicines only as told by your health care provider.  Drink enough fluid to keep your urine pale yellow.  Keep all follow-up visits as told by your health care provider. This is important. How is this prevented?  There is no vaccine to help prevent COVID-19 infection. However, there are steps you can take to protect yourself and others from this virus. To protect yourself:   Do not travel to areas where COVID-19 is a risk. The areas where COVID-19 is reported change often. To identify high-risk areas and travel restrictions, check the  CDC travel website: StageSync.si  If you live in, or must travel to, an area where COVID-19 is a risk, take precautions to avoid infection. ? Stay away from people who are sick. ? Wash your hands often with soap and water for 20 seconds. If soap and water are not available, use an alcohol-based hand sanitizer. ? Avoid touching your mouth, face, eyes, or nose. ? Avoid going out in public, follow guidance from your state and local health authorities. ? If you must go out in public, wear a cloth face covering or face mask. Make sure your mask covers your nose and mouth. ? Avoid crowded indoor spaces. Stay at least 6 feet (2 meters) away from others. ? Disinfect objects and surfaces that are frequently touched every day.  This may include:  Counters and tables.  Doorknobs and light switches.  Sinks and faucets.  Electronics, such as phones, remote controls, keyboards, computers, and tablets. To protect others: If you have symptoms of COVID-19, take steps to prevent the virus from spreading to others.  If you think you have a COVID-19 infection, contact your health care provider right away. Tell your health care team that you think you may have a COVID-19 infection.  Stay home. Leave your house only to seek medical care. Do not use public transport.  Do not travel while you are sick.  Wash your hands often with soap and water for 20 seconds. If soap and water are not available, use alcohol-based hand sanitizer.  Stay away from other members of your household. Let healthy household members care for children and pets, if possible. If you have to care for children or pets, wash your hands often and wear a mask. If possible, stay in your own room, separate from others. Use a different bathroom.  Make sure that all people in your household wash their hands well and often.  Cough or sneeze into a tissue or your sleeve or elbow. Do not cough or sneeze into your hand or into the air.  Wear a cloth face covering or face mask. Make sure your mask covers your nose and mouth. Where to find more information  Centers for Disease Control and Prevention: StickerEmporium.tn  World Health Organization: https://thompson-craig.com/ Contact a health care provider if:  You live in or have traveled to an area where COVID-19 is a risk and you have symptoms of the infection.  You have had contact with someone who has COVID-19 and you have symptoms of the infection. Get help right away if:  You have trouble breathing.  You have pain or pressure in your chest.  You have confusion.  You have bluish lips and fingernails.  You have difficulty waking from sleep.  You have symptoms  that get worse. These symptoms may represent a serious problem that is an emergency. Do not wait to see if the symptoms will go away. Get medical help right away. Call your local emergency services (911 in the [place] Do not drive yourself to the hospital. Let the emergency medical personnel know if you think you have COVID-19. Summary  COVID-19 is a respiratory infection that is caused by a virus. It is also known as coronavirus disease or novel coronavirus. It can cause serious infections, such as pneumonia, acute respiratory distress syndrome, acute respiratory failure, or sepsis.  The virus that causes COVID-19 is contagious. This means that it can spread from person to person through droplets from breathing, speaking, singing, coughing, or sneezing.  You are more likely to  develop a serious illness if you are 71 years of age or older, have a weak immune system, live in a nursing home, or have chronic disease.  There is no medicine to treat COVID-19. Your health care provider will talk with you about ways to treat your symptoms.  Take steps to protect yourself and others from infection. Wash your hands often and disinfect objects and surfaces that are frequently touched every day. Stay away from people who are sick and wear a mask if you are sick. This information is not intended to replace advice given to you by your health care provider. Make sure you discuss any questions you have with your health care provider. Document Revised: 03/22/2019 Document Reviewed: 06/28/2018 Elsevier Patient Education  [place].

## 2019-09-19 NOTE — Assessment & Plan Note (Addendum)
Tested positive after taking the J and J vaccine on March 13. She has a cough but this is a persistent concern for her and has not worsened although she does note it may be more productive of white phlegm. She has declined Monoclonal antibodies but she is encouraged to consider if her symptoms worsen. Pulse oximeter, want oxygen in 90s Take Multivitamin with minerals, selenium Vitamin D 1000-2000 IU daily Probiotic with lactobacillus and bifidophilus Asprin EC 81 mg daily Flaxseed or krill oil caps daily Melatonin 2-5 mg at bedtime

## 2019-09-21 ENCOUNTER — Other Ambulatory Visit: Payer: Self-pay

## 2019-09-21 ENCOUNTER — Encounter (HOSPITAL_COMMUNITY): Payer: Self-pay | Admitting: Emergency Medicine

## 2019-09-21 ENCOUNTER — Emergency Department (HOSPITAL_COMMUNITY)
Admission: EM | Admit: 2019-09-21 | Discharge: 2019-09-21 | Disposition: A | Discharge status: Home / Self Care | Payer: BC Managed Care – PPO | Attending: Emergency Medicine | Admitting: Emergency Medicine

## 2019-09-21 DIAGNOSIS — Z5321 Procedure and treatment not carried out due to patient leaving prior to being seen by health care provider: Secondary | ICD-10-CM | POA: Diagnosis not present

## 2019-09-21 DIAGNOSIS — U071 COVID-19: Secondary | ICD-10-CM | POA: Diagnosis present

## 2019-09-21 LAB — CBC
HCT: 40.5 % (ref 36.0–46.0)
Hemoglobin: 12.7 g/dL (ref 12.0–15.0)
MCH: 23 pg — ABNORMAL LOW (ref 26.0–34.0)
MCHC: 31.4 g/dL (ref 30.0–36.0)
MCV: 73.5 fL — ABNORMAL LOW (ref 80.0–100.0)
Platelets: 230 10*3/uL (ref 150–400)
RBC: 5.51 MIL/uL — ABNORMAL HIGH (ref 3.87–5.11)
RDW: 14.6 % (ref 11.5–15.5)
WBC: 12.2 10*3/uL — ABNORMAL HIGH (ref 4.0–10.5)
nRBC: 0 % (ref 0.0–0.2)

## 2019-09-21 LAB — LIPASE, BLOOD: Lipase: 24 U/L (ref 11–51)

## 2019-09-21 LAB — COMPREHENSIVE METABOLIC PANEL
ALT: 22 U/L (ref 0–44)
AST: 25 U/L (ref 15–41)
Albumin: 4.4 g/dL (ref 3.5–5.0)
Alkaline Phosphatase: 70 U/L (ref 38–126)
Anion gap: 14 (ref 5–15)
BUN: 21 mg/dL (ref 8–23)
CO2: 19 mmol/L — ABNORMAL LOW (ref 22–32)
Calcium: 10.3 mg/dL (ref 8.9–10.3)
Chloride: 103 mmol/L (ref 98–111)
Creatinine, Ser: 1.69 mg/dL — ABNORMAL HIGH (ref 0.44–1.00)
GFR calc Af Amer: 37 mL/min — ABNORMAL LOW (ref >60)
GFR calc non Af Amer: 32 mL/min — ABNORMAL LOW (ref >60)
Glucose, Bld: 224 mg/dL — ABNORMAL HIGH (ref 70–99)
Potassium: 4.8 mmol/L (ref 3.5–5.1)
Sodium: 136 mmol/L (ref 135–145)
Total Bilirubin: 0.5 mg/dL (ref 0.3–1.2)
Total Protein: 9.6 g/dL — ABNORMAL HIGH (ref 6.5–8.1)

## 2019-09-21 MED ORDER — SODIUM CHLORIDE 0.9% FLUSH: 3.0000 mL | Freq: Once | INTRAVENOUS | Status: DC

## 2019-09-21 NOTE — ED Notes (Signed)
Pt left. 

## 2019-09-21 NOTE — Assessment & Plan Note (Signed)
Stable despite covid diagnosis 

## 2019-09-21 NOTE — ED Triage Notes (Signed)
Pt brought to ED by daughter states she is Covid positive 09/11/2019 having increase vomiting, abd cramping. Pt took her home pain medication and she denies any pain on triage. No SOB, normal, normal SPO2, NAD noticed.

## 2019-09-21 NOTE — Assessment & Plan Note (Deleted)
Stable despite covid diagnosis

## 2019-09-21 NOTE — Progress Notes (Addendum)
Virtual Visit via phone Note  I connected with [name] on 09/19/19 at  2:20 PM EDT by a phone enabled telemedicine application and verified that I am speaking with the correct person using two identifiers.  Location: Patient: home Provider: home   I discussed the limitations of evaluation and management by telemedicine and the availability of in person appointments. The patient expressed understanding and agreed to proceed. CMA was able to get the patient setup on a phone visit after being unable to set up a video visit   Subjective:    Patient ID: Jamie Cox, female    DOB: Dec 28, 1960, 59 y.o.   MRN: [id number]  Chief Complaint  Patient presents with  . Cough    Post nasal drip  . Covid 19    tested positive for covid 4-07    HPI Patient is in today for follow up after being diagnosed with COVID. She has chronic asthma, cough variant and struggles with significant cough at baseline. Since being diagnosed with COVID her cough is not worse but possibly slightly more productive of white phelgm. No other acute complaints. Denies CP/palp/HA/fevers/GI or GU c/o. Taking meds as prescribed  Past Medical History:  Diagnosis Date  . Allergy   . Asthma   . Atypical chest pain   . Chronic sinusitis    Followed by Dr. Jenne Pane  . Environmental allergies   . GERD (gastroesophageal reflux disease)   . 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 History of chicken pox   . Hypercalcemia 07/19/2015  . Osteoporosis, unspecified    steroid induced  . Personal history of other diseases of circulatory system   . Rheumatoid arthritis(714.0)   . Unspecified deficiency anemia    microcytic  . Urticaria     Past Surgical History:  Procedure Laterality Date  . NASAL SINUS SURGERY  06/2012    Family History  Problem Relation Age of Onset  . Liver cancer Maternal Grandmother   . Hypertension Maternal Grandmother   . Heart attack Maternal Grandmother   . Scleroderma Mother   .  Heart disease Mother   . Kidney disease Mother   . Prostate cancer Father   . Diabetes Father   . Heart disease Father        CHF  . Arthritis Father        s/p hip replacement  . Cancer Father        lung cancer with mets, smoker  . Lung cancer Paternal Uncle   . Coronary artery disease Paternal Grandmother   . Hypertension Paternal Grandmother   . Liver cancer Paternal Grandmother   . Hyperlipidemia Son   . Alzheimer's disease Paternal Grandfather   . Other Sister        Cardiomegaly  . Arthritis Sister   . Heart disease Sister        rare  . Sleep apnea Brother   . Arthritis Daughter   . Lupus Cousin   . Arthritis Cousin   . Colon cancer Neg Hx     Social History   Socioeconomic History  . Marital status: Married    Spouse name: Not on file  . Number of children: 2  . Years of education: Not on file  . Highest education level: Not on file  Occupational History  . Occupation: Data processing manager  Tobacco Use  . Smoking status: Never Smoker  . Smokeless tobacco: Never Used  Substance and Sexual Activity  . Alcohol use: No  Alcohol/week: 0.0 standard drinks  . Drug use: No  . Sexual activity: Not Currently  Other Topics Concern  . Not on file  Social History Narrative  . Not on file   Social Determinants of Health   Financial Resource Strain:   . Difficulty of Paying Living Expenses:   Food Insecurity:   . Worried About Programme researcher, broadcasting/film/video in the Last Year:   . Barista in the Last Year:   Transportation Needs:   . Freight forwarder (Medical):   Marland Kitchen Lack of Transportation (Non-Medical):   Physical Activity:   . Days of Exercise per Week:   . Minutes of Exercise per Session:   Stress:   . Feeling of Stress :   Social Connections:   . Frequency of Communication with Friends and Family:   . Frequency of Social Gatherings with Friends and Family:   . Attends Religious Services:   . Active Member of Clubs or Organizations:   . Attends Occupational hygienist Meetings:   Marland Kitchen Marital Status:   Intimate Partner Violence:   . Fear of Current or Ex-Partner:   . Emotionally Abused:   Marland Kitchen Physically Abused:   . Sexually Abused:     Outpatient Medications Prior to Visit  Medication Sig Dispense Refill  . albuterol (VENTOLIN HFA) 108 (90 Base) MCG/ACT inhaler INHALE ONE TO TWO PUFFS INTO THE LUNGS EVERY 6 HOURS AS NEEDED FOR WHEEZING OR SHORTNESS OF BREATH (Patient not taking: Reported on 07/15/2019) 18 g 5  . famotidine (PEPCID) 40 MG tablet TAKE 1 TABLET BY MOUTH EVERY DAY 30 tablet 0  . fexofenadine (ALLEGRA) 180 MG tablet Take 180 mg by mouth daily.    . fluticasone (FLONASE) 50 MCG/ACT nasal spray USE TWO SPRAY(S) IN EACH NOSTRIL ONCE DAILY (Patient not taking: Reported on 07/15/2019) 48 g 3  . fluticasone (FLONASE) 50 MCG/ACT nasal spray Place 2 sprays into both nostrils daily. (Patient not taking: Reported on 08/30/2019) 16 g 1  . HEMATINIC/FOLIC ACID 324-1 MG TABS TAKE 1 TABLET BY MOUTH EVERY DAY 90 tablet 1  . meloxicam (MOBIC) 7.5 MG tablet TAKE 1 TO 2 TABLETS BY MOUTH ONCE DAILY AS NEEDED FOR PAIN (Patient not taking: Reported on 08/30/2019) 60 tablet 3  . methotrexate 2.5 MG tablet     . metoprolol succinate (TOPROL-XL) 25 MG 24 hr tablet Take 1 tablet by mouth once daily 14 tablet 0  . montelukast (SINGULAIR) 10 MG tablet TAKE 1 TABLET BY MOUTH ONCE DAILY AT BEDTIME (Patient not taking: Reported on 08/30/2019) 90 tablet 0  . pantoprazole (PROTONIX) 40 MG tablet Take 1 tablet (40 mg total) by mouth daily. (Patient not taking: Reported on 08/30/2019) 90 tablet 1  . predniSONE (DELTASONE) 2.5 MG tablet Take 1 tablet by mouth once daily with breakfast (Patient not taking: Reported on 08/30/2019) 90 tablet 0  . SYMBICORT 80-4.5 MCG/ACT inhaler INHALE 2 PUFFS INTO THE LUNGS TWO TIMES DAILY (Patient not taking: Reported on 08/30/2019) 1 Inhaler 3  . cyclobenzaprine (FLEXERIL) 5 MG tablet Take 1 tablet (5 mg total) by mouth 3 (three) times daily as  needed for muscle spasms. (Patient not taking: Reported on 08/30/2019) 20 tablet 0  . gabapentin (NEURONTIN) 100 MG capsule TAKE 1 CAPSULE BY MOUTH TWICE DAILY MORNING AND NOON. (Patient not taking: Reported on 08/30/2019) 60 capsule 0  . gabapentin (NEURONTIN) 300 MG capsule Take 1 capsule (300 mg total) by mouth at bedtime.    Marland Kitchen HYDROcodone-acetaminophen (NORCO/VICODIN)  5-325 MG tablet Take 1 tablet by mouth every 6 (six) hours as needed for moderate pain. 30 tablet 0   No facility-administered medications prior to visit.    Allergies  Allergen Reactions  . Avelox [Moxifloxacin Hcl In Nacl] Diarrhea, Nausea And Vomiting and Other (See Comments)    Dizziness, Cold chills   . Bactrim [Sulfamethoxazole-Trimethoprim] Hives  . Sulfamethoxazole-Trimethoprim Hives    Review of Systems  Constitutional: Positive for malaise/fatigue. Negative for chills and fever.  HENT: Positive for congestion.   Eyes: Negative for blurred vision.  Respiratory: Positive for cough and sputum production. Negative for shortness of breath.   Cardiovascular: Negative for chest pain, palpitations and leg swelling.  Gastrointestinal: Negative for abdominal pain, blood in stool and nausea.  Genitourinary: Negative for dysuria and frequency.  Musculoskeletal: Negative for falls.  Skin: Negative for rash.  Neurological: Negative for dizziness, loss of consciousness and headaches.  Endo/Heme/Allergies: Negative for environmental allergies.  Psychiatric/Behavioral: Negative for depression. The patient is not nervous/anxious.        Objective:    Physical Exam unable to obtain via phone   There were no vitals taken for this visit. Wt Readings from Last 3 Encounters:  09/21/19 108 lb 0.4 oz (49 kg)  07/15/19 108 lb (49 kg)  08/10/18 106 lb 3.2 oz (48.2 kg)    Diabetic Foot Exam - Simple   No data filed     Lab Results  Component Value Date   WBC 12.2 (H) 09/21/2019   HGB 12.7 09/21/2019   HCT 40.5  09/21/2019   PLT 230 09/21/2019   GLUCOSE 224 (H) 09/21/2019   CHOL 205 (H) 07/09/2018   TRIG 55.0 07/09/2018   HDL 90.00 07/09/2018   LDLCALC 104 (H) 07/09/2018   ALT 22 09/21/2019   AST 25 09/21/2019   NA 136 09/21/2019   K 4.8 09/21/2019   CL 103 09/21/2019   CREATININE 1.69 (H) 09/21/2019   BUN 21 09/21/2019   CO2 19 (L) 09/21/2019   TSH 0.88 07/09/2018    Lab Results  Component Value Date   TSH 0.88 07/09/2018   Lab Results  Component Value Date   WBC 12.2 (H) 09/21/2019   HGB 12.7 09/21/2019   HCT 40.5 09/21/2019   MCV 73.5 (L) 09/21/2019   PLT 230 09/21/2019   Lab Results  Component Value Date   NA 136 09/21/2019   K 4.8 09/21/2019   CO2 19 (L) 09/21/2019   GLUCOSE 224 (H) 09/21/2019   BUN 21 09/21/2019   CREATININE 1.69 (H) 09/21/2019   BILITOT 0.5 09/21/2019   ALKPHOS 70 09/21/2019   AST 25 09/21/2019   ALT 22 09/21/2019   PROT 9.6 (H) 09/21/2019   ALBUMIN 4.4 09/21/2019   CALCIUM 10.3 09/21/2019   ANIONGAP 14 09/21/2019   GFR 78.33 07/09/2018   Lab Results  Component Value Date   CHOL 205 (H) 07/09/2018   Lab Results  Component Value Date   HDL 90.00 07/09/2018   Lab Results  Component Value Date   LDLCALC 104 (H) 07/09/2018   Lab Results  Component Value Date   TRIG 55.0 07/09/2018   Lab Results  Component Value Date   CHOLHDL 2 07/09/2018   No results found for: HGBA1C     Assessment & Plan:   Problem List Items Addressed This Visit    Chronic cough   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 She has a cough but this is a persistent concern for her and has not worsened although she does note it may be more productive of white phlegm. She has declined Monoclonal antibodies but she is encouraged to consider if her symptoms worsen. Pulse oximeter, want oxygen in 90s Take Multivitamin with minerals,  selenium Vitamin D 1000-2000 IU daily Probiotic with lactobacillus and bifidophilus Asprin EC 81 mg daily Flaxseed or krill oil caps daily Melatonin 2-5 mg at bedtime          I have discontinued Benetta Tat's cyclobenzaprine, gabapentin, and HYDROcodone-acetaminophen. I have also changed her gabapentin. Additionally, I am having her start on ciprofloxacin. Lastly, I am having her maintain her methotrexate, fexofenadine, Symbicort, fluticasone, pantoprazole, albuterol, meloxicam, fluticasone, predniSONE, montelukast, metoprolol succinate, famotidine, and Hematinic/Folic Acid.  Meds ordered this encounter  Medications  . ciprofloxacin (CIPRO) 500 MG tablet    Sig: Take 1 tablet (500 mg total) by mouth 2 (two) times daily.    Dispense:  14 tablet    Refill:  0  . gabapentin (NEURONTIN) 300 MG capsule    Sig: Take 1 capsule (300 mg total) by mouth 3 (three) times daily.    Dispense:  90 capsule    Refill:  3   I discussed the assessment and treatment plan with the patient. The patient was provided an opportunity to ask questions and all were answered. The patient agreed with the plan and demonstrated an understanding of the instructions.   The patient was advised to call back or seek an in-person evaluation if the symptoms worsen or if the condition fails to improve as anticipated.  I provided 15 minutes of non-face-to-face time during this encounter.   Danise Edge, MD

## 2019-09-23 ENCOUNTER — Other Ambulatory Visit: Payer: Self-pay | Admitting: Family Medicine

## 2019-09-23 ENCOUNTER — Telehealth: Payer: Self-pay | Admitting: Family Medicine

## 2019-09-23 DIAGNOSIS — K219 Gastro-esophageal reflux disease without esophagitis: Secondary | ICD-10-CM

## 2019-09-23 MED ORDER — BUDESONIDE-FORMOTEROL FUMARATE 80-4.5 MCG/ACT IN AERO: INHALATION_SPRAY | RESPIRATORY_TRACT | 1 refills | Status: DC

## 2019-09-23 NOTE — Telephone Encounter (Signed)
Medication: SYMBICORT 80-4.5 MCG/ACT inhaler    Has the patient contacted their pharmacy? No. (If no, request that the patient contact the pharmacy for the refill.) (If yes, when and what did the pharmacy advise?)  Preferred Pharmacy (with phone number or street name):[id number] - [place] ([place]), [place] - [address]  [address] [place] ([place]) [place] [address]  Phone:  [phone] Fax:  [phone]   Agent: Please be advised that RX refills may take up to 3 business days. We ask that you follow-up with your pharmacy.

## 2019-09-23 NOTE — Telephone Encounter (Signed)
Refill done.  

## 2019-09-24 ENCOUNTER — Telehealth: Payer: Self-pay | Admitting: Family Medicine

## 2019-09-24 NOTE — Telephone Encounter (Signed)
Caller : Khristin Keleher  Call Back # [phone]  Subject : Letter   Patient is requesting a letter to return to work after covid. Patient states her covid quarantine end date is April 22,2021.Patient wishes to return to work on April 23,2021 Patient wants to know if she should be retest before returning back to work .  Please Advise

## 2019-09-24 NOTE — Telephone Encounter (Signed)
Please write her letter releasing back to work on 09/27/19. She does not need to be retested to return to work the time is enough to clear people back to work at this time.

## 2019-09-25 NOTE — Telephone Encounter (Signed)
Patient stated that she spoke with her boss yesterday and they stated that the policy has changed for returning to work and she can return tomorrow.  Note written to return tomorrow.

## 2019-10-01 ENCOUNTER — Encounter: Payer: Self-pay | Admitting: Podiatry

## 2019-10-01 ENCOUNTER — Other Ambulatory Visit: Payer: Self-pay

## 2019-10-01 ENCOUNTER — Ambulatory Visit (INDEPENDENT_AMBULATORY_CARE_PROVIDER_SITE_OTHER): Payer: BC Managed Care – PPO | Admitting: Podiatry

## 2019-10-01 DIAGNOSIS — B351 Tinea unguium: Secondary | ICD-10-CM | POA: Diagnosis not present

## 2019-10-01 DIAGNOSIS — M79676 Pain in unspecified toe(s): Secondary | ICD-10-CM | POA: Diagnosis not present

## 2019-10-01 DIAGNOSIS — Q828 Other specified congenital malformations of skin: Secondary | ICD-10-CM | POA: Diagnosis not present

## 2019-10-01 NOTE — Progress Notes (Signed)
Subjective:  Patient ID: Jamie Cox, female    DOB: [birth year],  MRN: [id number] HPI Chief Complaint  Patient presents with  . Nail Problem    Toenails - unable to cut due to RA and thickness of nails, concerned about the deformity of the big toenails  . New Patient (Initial Visit)    Est pt 2019    59 y.o. female presents with the above complaint.   ROS: Denies fever chills nausea vomiting muscle aches pains. Denies calf pain back pain chest pain shortness of breath.  Past Medical History:  Diagnosis Date  . Allergy   . Asthma   . Atypical chest pain   . Chronic sinusitis    Followed by Dr. Jenne Pane  . Environmental allergies   . GERD (gastroesophageal reflux disease)   . 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 History of chicken pox   . Hypercalcemia 07/19/2015  . Osteoporosis, unspecified    steroid induced  . Personal history of other diseases of circulatory system   . Rheumatoid arthritis(714.0)   . Unspecified deficiency anemia    microcytic  . Urticaria    Past Surgical History:  Procedure Laterality Date  . NASAL SINUS SURGERY  06/2012    Current Outpatient Medications:  .  albuterol (VENTOLIN HFA) 108 (90 Base) MCG/ACT inhaler, INHALE ONE TO TWO PUFFS INTO THE LUNGS EVERY 6 HOURS AS NEEDED FOR WHEEZING OR SHORTNESS OF BREATH (Patient not taking: Reported on 07/15/2019), Disp: 18 g, Rfl: 5 .  budesonide-formoterol (SYMBICORT) 80-4.5 MCG/ACT inhaler, INHALE 2 PUFFS INTO THE LUNGS TWO TIMES DAILY, Disp: 1 Inhaler, Rfl: 1 .  famotidine (PEPCID) 40 MG tablet, TAKE 1 TABLET BY MOUTH EVERY DAY, Disp: 30 tablet, Rfl: 0 .  fexofenadine (ALLEGRA) 180 MG tablet, Take 180 mg by mouth daily., Disp: , Rfl:  .  gabapentin (NEURONTIN) 300 MG capsule, Take 1 capsule (300 mg total) by mouth 3 (three) times daily., Disp: 90 capsule, Rfl: 3 .  HEMATINIC/FOLIC ACID 324-1 MG TABS, TAKE 1 TABLET BY MOUTH EVERY DAY, Disp: 90 tablet, Rfl: 1 .  methotrexate 2.5 MG tablet, , Disp:  , Rfl:  .  metoprolol succinate (TOPROL-XL) 25 MG 24 hr tablet, TAKE 1 TABLET BY MOUTH ONCE DAILY PT  NEEDS  FOLLOW  UP  WITH  PCP, Disp: 30 tablet, Rfl: 0 .  pantoprazole (PROTONIX) 40 MG tablet, Take 1 tablet by mouth once daily, Disp: 90 tablet, Rfl: 0  Allergies  Allergen Reactions  . Avelox [Moxifloxacin Hcl In Nacl] Diarrhea, Nausea And Vomiting and Other (See Comments)    Dizziness, Cold chills   . Bactrim [Sulfamethoxazole-Trimethoprim] Hives  . Sulfamethoxazole-Trimethoprim Hives   Review of Systems Objective:  There were no vitals filed for this visit.  General: Well developed, nourished, in no acute distress, alert and oriented x3   Dermatological: Skin is warm, dry and supple bilateral. Nails x 10 are poorly maintained they are thick yellow dystrophic. She also has reactive hyperkeratotic lesions subtwo and 3 left; remaining integument appears unremarkable at this time. There are no open sores, no preulcerative lesions, no rash or signs of infection present.  Vascular: Dorsalis Pedis artery and Posterior Tibial artery pedal pulses are 2/4 bilateral with immedate capillary fill time. Pedal hair growth present. No varicosities and no lower extremity edema present bilateral.   Neruologic: Grossly intact via light touch bilateral. Vibratory intact via tuning fork bilateral. Protective threshold with Semmes Wienstein monofilament intact to all pedal sites bilateral. Patellar and Achilles  deep tendon reflexes 2+ bilateral. No Babinski or clonus noted bilateral.   Musculoskeletal: No gross boney pedal deformities bilateral. No pain, crepitus, or limitation noted with foot and ankle range of motion bilateral. Muscular strength 5/5 in all groups tested bilateral.  Gait: Unassisted, Nonantalgic.    Radiographs:  None taken  Assessment & Plan:   Assessment: Pain in limb secondary to onychomycosis Nail dystrophy.  Plan: Debridement of all reactive hyperkeratotic tissue debridement  of toenails 1 through 5 bilaterally.     Keela Rubert T. [place], [place]

## 2019-10-10 ENCOUNTER — Other Ambulatory Visit: Payer: Self-pay | Admitting: Family Medicine

## 2019-10-11 ENCOUNTER — Other Ambulatory Visit: Payer: Self-pay

## 2019-10-11 ENCOUNTER — Encounter: Payer: Self-pay | Admitting: Internal Medicine

## 2019-10-11 ENCOUNTER — Ambulatory Visit (INDEPENDENT_AMBULATORY_CARE_PROVIDER_SITE_OTHER): Payer: BC Managed Care – PPO | Admitting: Internal Medicine

## 2019-10-11 VITALS — BP 131/88 | HR 106 | Temp 96.8°F | Resp 16 | Ht 61.0 in | Wt 112.1 lb

## 2019-10-11 DIAGNOSIS — S8992XA Unspecified injury of left lower leg, initial encounter: Secondary | ICD-10-CM | POA: Diagnosis not present

## 2019-10-11 MED ORDER — CEPHALEXIN 500 MG PO CAPS: 500.0000 mg | ORAL_CAPSULE | Freq: Four times a day (QID) | ORAL | 0 refills | Status: DC

## 2019-10-11 MED ORDER — MUPIROCIN CALCIUM 2 % EX CREA: 1.0000 "application " | TOPICAL_CREAM | Freq: Two times a day (BID) | CUTANEOUS | 0 refills | Status: DC

## 2019-10-11 MED ORDER — MUPIROCIN 2 % EX OINT: 1.0000 "application " | TOPICAL_OINTMENT | Freq: Two times a day (BID) | CUTANEOUS | 0 refills | Status: DC

## 2019-10-11 NOTE — Progress Notes (Signed)
Subjective:    Patient ID: Jamie Cox, female    DOB: 11-08-1960, 59 y.o.   MRN: [id number]  DOS:  10/11/2019 Type of visit - description: Acute On 10/03/2019, [facility], she had a mechanical fall, landed on her knees, scraped the left knee.  Since then, she has been taking care of it  with Band-Aids and over-the-counter Neosporin. In the last few days has noticed some redness and also the area is oozing brownish discharge.  Overall, today the redness is decreased today.    Review of Systems No fever chills No major mechanical pain on the knee.  Past Medical History:  Diagnosis Date  . Allergy   . Asthma   . Atypical chest pain   . Chronic sinusitis    Followed by Dr. Redmond Baseman  . Environmental allergies   . GERD (gastroesophageal reflux disease)   . 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 History of chicken pox   . Hypercalcemia 07/19/2015  . Osteoporosis, unspecified    steroid induced  . Personal history of other diseases of circulatory system   . Rheumatoid arthritis(714.0)   . Unspecified deficiency anemia    microcytic  . Urticaria     Past Surgical History:  Procedure Laterality Date  . NASAL SINUS SURGERY  06/2012    Allergies as of 10/11/2019      Reactions   Avelox [moxifloxacin Hcl In Nacl] Diarrhea, Nausea And Vomiting, Other (See Comments)   Dizziness, Cold chills    Bactrim [sulfamethoxazole-trimethoprim] Hives   Sulfamethoxazole-trimethoprim Hives      Medication List       Accurate as of Oct 11, 2019 11:59 PM. If you have any questions, ask your nurse or doctor.        albuterol 108 (90 Base) MCG/ACT inhaler Commonly known as: VENTOLIN HFA INHALE ONE TO TWO PUFFS INTO THE LUNGS EVERY 6 HOURS AS NEEDED FOR WHEEZING OR SHORTNESS OF BREATH   budesonide-formoterol 80-4.5 MCG/ACT inhaler Commonly known as: Symbicort INHALE 2 PUFFS INTO THE LUNGS TWO TIMES DAILY   cephALEXin 500 MG  capsule Commonly known as: KEFLEX Take 1 capsule (500 mg total) by mouth 4 (four) times daily. Started by: Kathlene November, MD   famotidine 40 MG tablet Commonly known as: PEPCID TAKE 1 TABLET BY MOUTH EVERY DAY   fexofenadine 180 MG tablet Commonly known as: ALLEGRA Take 180 mg by mouth daily.   fluticasone 50 MCG/ACT nasal spray Commonly known as: FLONASE Place 2 sprays into both nostrils daily.   gabapentin 300 MG capsule Commonly known as: NEURONTIN Take 1 capsule (300 mg total) by mouth 3 (three) times daily.   Hematinic/Folic Acid 086-5 MG Tabs Generic drug: Ferrous Fumarate-Folic Acid TAKE 1 TABLET BY MOUTH EVERY DAY   methotrexate 2.5 MG tablet   metoprolol succinate 25 MG 24 hr tablet Commonly known as: TOPROL-XL TAKE 1 TABLET BY MOUTH ONCE DAILY PT  NEEDS  FOLLOW  UP  WITH  PCP   mupirocin ointment 2 % Commonly known as: Bactroban Apply 1 application topically 2 (two) times daily. Started by: Kathlene November, MD   pantoprazole 40 MG tablet Commonly known as: PROTONIX Take 1 tablet by mouth once daily          Objective:   Physical Exam BP 131/88 (BP Location: Left Arm, Patient Position: Sitting, Cuff Size: Small)   Pulse (!) 106   Temp (!) 96.8 F (36 C) (Temporal)  Resp 16   Ht 5\' 1"  (1.549 m)   Wt 112 lb 2 oz (50.9 kg)   SpO2 100%   BMI 21.19 kg/m  General:   Well developed, NAD, BMI noted. HEENT:  Normocephalic . Face symmetric, atraumatic L knee: No deformities, range of motion normal, no effusion. Skin: See picture, no obvious abscess. Neurologic:  alert & oriented X3.  Speech normal, gait appropriate for age and unassisted Psych--  Cognition and judgment appear intact.  Cooperative with normal attention span and concentration.  Behavior appropriate. No anxious or depressed appearing.        Assessment     59 year old female, PMH includes rheumatoid arthritis, osteoporosis, GERD, chronic sinusitis, asthma, on MTX,, she tested positive for  Covid ~ 09/11/2019 presents with  Scrape, left knee: Develop a scrape at the left knee after a fall few days ago, minimal redness, possibly early cellulitis. Plan: Continue topical care with soap, water, Bactroban. Keflex x5 days Call if not gradually better, see AVS.  This visit occurred during the SARS-CoV-2 public health emergency.  Safety protocols were in place, including screening questions prior to the visit, additional usage of staff PPE, and extensive cleaning of exam room while observing appropriate contact time as indicated for disinfecting solutions.

## 2019-10-11 NOTE — Patient Instructions (Signed)
Keep the area clean and dry  Occasional use soap and water  Instead of the over-the-counter ointment use Bactroban cream twice a day until better  Take Keflex, an antibiotic for 5 days  Call if not gradually better  Call if fever, chills, increased redness or discharge

## 2019-10-11 NOTE — Progress Notes (Signed)
Pre visit review using our clinic review tool, if applicable. No additional management support is needed unless otherwise documented below in the visit note. 

## 2019-10-14 ENCOUNTER — Other Ambulatory Visit: Payer: Self-pay | Admitting: Family Medicine

## 2019-10-23 ENCOUNTER — Telehealth: Payer: Self-pay | Admitting: Family Medicine

## 2019-10-23 ENCOUNTER — Other Ambulatory Visit: Payer: Self-pay | Admitting: Family Medicine

## 2019-10-23 NOTE — Telephone Encounter (Signed)
Caller: Chrystine  Call back number: [phone]  Patient states she is having sinus drainage (green mucus). Patient decline an appointment. Patient states she would like doctors advise.  Please advise

## 2019-10-23 NOTE — Telephone Encounter (Signed)
It looks like she just finished a course of Keflex recently. Was she better on it and then got worse off of it. Or was there no change to symptoms while taking? What other symptoms is she having? We may need to do a visit to decide what to do.

## 2019-10-24 NOTE — Telephone Encounter (Signed)
Patient has appt with Ramon Dredge tomorrow.  She was taking Keflex for her knee.  She had a fall.

## 2019-10-25 ENCOUNTER — Encounter: Payer: Self-pay | Admitting: Medical

## 2019-10-25 ENCOUNTER — Telehealth (INDEPENDENT_AMBULATORY_CARE_PROVIDER_SITE_OTHER): Payer: BC Managed Care – PPO | Admitting: Medical

## 2019-10-25 ENCOUNTER — Other Ambulatory Visit: Payer: Self-pay

## 2019-10-25 VITALS — BP 104/63 | HR 102 | Temp 97.5°F

## 2019-10-25 DIAGNOSIS — J301 Allergic rhinitis due to pollen: Secondary | ICD-10-CM | POA: Diagnosis not present

## 2019-10-25 MED ORDER — FLUTICASONE PROPIONATE 50 MCG/ACT NA SUSP: 2.0000 | Freq: Every day | NASAL | 1 refills | Status: DC

## 2019-10-25 MED ORDER — AZITHROMYCIN 250 MG PO TABS: ORAL_TABLET | ORAL | 0 refills | Status: DC

## 2019-10-25 MED ORDER — LEVOCETIRIZINE DIHYDROCHLORIDE 5 MG PO TABS: 5.0000 mg | ORAL_TABLET | Freq: Every evening | ORAL | 0 refills | Status: DC

## 2019-10-25 NOTE — Patient Instructions (Signed)
You do have allergic rhinitis type signs and symptoms so will rx xyzal and flonase.   Will make azithromycin available if you get sinus pressure, chest congestoin or productive cough.  Follow up 7-10 days or as needed

## 2019-10-25 NOTE — Progress Notes (Signed)
   Subjective:    Patient ID: Jamie Cox, female    DOB: September 15, 1960, 59 y.o.   MRN: [id number]  HPI  Virtual Visit via Telephone Note  I connected with Jamie Cox on 10/25/19 at 10:40 AM EDT by telephone and verified that I am speaking with the correct person using two identifiers.  Location: Patient: home Provider: office   I discussed the limitations, risks, security and privacy concerns of performing an evaluation and management service by telephone and the availability of in person appointments. I also discussed with the patient that there may be a patient responsible charge related to this service. The patient expressed understanding and agreed to proceed.   History of Present Illness:   Pt states recent sinus drainage,nasal congestion, sneezing and pain over sinuses  past 2 weeks. Recent green mucus when she blows her nose just recently.  No fever, no chills, no sweats or body aches. Decreased smell and taste. No severe altered taste or change.  Pt got one dose of johnson and johnson covid vaccine march. She also got covid one month afterwards. Test was positive and had brief mild illness.   Observations/Objective: General-no acute distress, pleasant, oriented.normal speech. Sounds nasal congested. Lungs- breathing does not sound labored   Assessment and Plan: You do have allergic rhinitis type signs and symptoms so will rx xyzal and flonase.   Will make azithromycin available if you get sinus pressure, chest congestoin or productive cough.  Follow up 7-10 days or as needed  [name], PA-C  Follow Up Instructions:    I discussed the assessment and treatment plan with the patient. The patient was provided an opportunity to ask questions and all were answered. The patient agreed with the plan and demonstrated an understanding of the instructions.   The patient was advised to call back or seek an in-person evaluation if the symptoms worsen or if the  condition fails to improve as anticipated.  I provided 15 minutes of non-face-to-face time during this encounter.   [name], PA-C   Review of Systems  Constitutional: Negative for chills, fatigue and fever.  HENT: Positive for congestion, sinus pressure, sinus pain and sneezing. Negative for ear pain.   Respiratory: Negative for cough, chest tightness, shortness of breath and wheezing.   Cardiovascular: Negative for chest pain and palpitations.  Gastrointestinal: Negative for abdominal pain.  Musculoskeletal: Negative for back pain and myalgias.  Hematological: Negative for adenopathy. Does not bruise/bleed easily.  Psychiatric/Behavioral: Negative for behavioral problems and confusion.       Objective:   Physical Exam        Assessment & Plan:

## 2019-10-29 ENCOUNTER — Other Ambulatory Visit: Payer: Self-pay | Admitting: Family Medicine

## 2019-10-29 ENCOUNTER — Telehealth: Payer: Self-pay | Admitting: Family Medicine

## 2019-10-29 NOTE — Telephone Encounter (Signed)
Caller: Aram Beecham  Call Back phone number: [phone]  Patient is requesting refill  On Hydrocodone 5-325 mr  [id number] - [place] ([place]), [place] - [address]  [address] [place]) [place] [address]  Phone:  [phone] Fax:  [phone]

## 2019-10-30 NOTE — Telephone Encounter (Signed)
Patient must make an appointment before medication can be refilled.

## 2019-11-01 ENCOUNTER — Other Ambulatory Visit: Payer: Self-pay | Admitting: Family Medicine

## 2019-11-01 DIAGNOSIS — M069 Rheumatoid arthritis, unspecified: Secondary | ICD-10-CM

## 2019-11-01 MED ORDER — HYDROCODONE-ACETAMINOPHEN 5-325 MG PO TABS: 1.0000 | ORAL_TABLET | Freq: Four times a day (QID) | ORAL | 0 refills | Status: DC | PRN

## 2019-11-01 NOTE — Telephone Encounter (Signed)
Last written: 07/12/19 Last ov: 09/19/19 Next ov: 11/14/19 Contract: will get at next visit UDS: will get at next visit

## 2019-11-14 ENCOUNTER — Telehealth (INDEPENDENT_AMBULATORY_CARE_PROVIDER_SITE_OTHER): Payer: BC Managed Care – PPO | Admitting: Family Medicine

## 2019-11-14 ENCOUNTER — Other Ambulatory Visit: Payer: Self-pay

## 2019-11-14 VITALS — BP 125/71 | HR 84 | Temp 97.2°F

## 2019-11-14 DIAGNOSIS — K219 Gastro-esophageal reflux disease without esophagitis: Secondary | ICD-10-CM | POA: Diagnosis not present

## 2019-11-14 DIAGNOSIS — J328 Other chronic sinusitis: Secondary | ICD-10-CM

## 2019-11-14 DIAGNOSIS — M7918 Myalgia, other site: Secondary | ICD-10-CM

## 2019-11-14 DIAGNOSIS — Z7952 Long term (current) use of systemic steroids: Secondary | ICD-10-CM | POA: Diagnosis not present

## 2019-11-14 DIAGNOSIS — T7840XD Allergy, unspecified, subsequent encounter: Secondary | ICD-10-CM

## 2019-11-14 DIAGNOSIS — M810 Age-related osteoporosis without current pathological fracture: Secondary | ICD-10-CM

## 2019-11-14 DIAGNOSIS — R739 Hyperglycemia, unspecified: Secondary | ICD-10-CM

## 2019-11-14 DIAGNOSIS — U071 COVID-19: Secondary | ICD-10-CM

## 2019-11-14 DIAGNOSIS — R000 Tachycardia, unspecified: Secondary | ICD-10-CM

## 2019-11-14 DIAGNOSIS — R7989 Other specified abnormal findings of blood chemistry: Secondary | ICD-10-CM

## 2019-11-14 MED ORDER — MONTELUKAST SODIUM 10 MG PO TABS: 10.0000 mg | ORAL_TABLET | Freq: Every day | ORAL | 5 refills | Status: DC

## 2019-11-14 MED ORDER — PREDNISONE 2.5 MG PO TABS: 2.5000 mg | ORAL_TABLET | Freq: Every day | ORAL | 5 refills | Status: DC

## 2019-11-14 MED ORDER — METOPROLOL SUCCINATE ER 25 MG PO TB24: ORAL_TABLET | ORAL | 5 refills | Status: DC

## 2019-11-15 ENCOUNTER — Other Ambulatory Visit: Payer: Self-pay | Admitting: Family Medicine

## 2019-11-18 NOTE — Assessment & Plan Note (Signed)
Continue current meds, refill given on Singulair

## 2019-11-18 NOTE — Assessment & Plan Note (Signed)
She has recovered well and has been fully vaccinated.

## 2019-11-18 NOTE — Assessment & Plan Note (Signed)
Encouraged to get adequate exercise, calcium and vitamin d intake 

## 2019-11-18 NOTE — Progress Notes (Signed)
Virtual Visit via Phone Note  I connected with Jamie Cox on 11/14/19 at  2:40 PM EDT by a phone enabled telemedicine application and verified that I am speaking with the correct person using two identifiers.  Location: Patient: home Provider: office   I discussed the limitations of evaluation and management by telemedicine and the availability of in person appointments. The patient expressed understanding and agreed to proceed. Thelma Barge, CMA was able to get the patient set up on a phone visit after not being able to set up on a video visit    Subjective:    Patient ID: Jamie Cox, female    DOB: Sep 27, 1960, 59 y.o.   MRN: [id number]  Chief Complaint  Patient presents with  . Medication Refill    HPI Patient is in today for follow up on chronic medical concerns. No recent febrile illness or hospitalizations. She has recovered well from her COVID 49 but she is struggling with her allergies some with some congestion. She needs a refill on Singulair. Denies CP/palp/HA/fevers/GI or GU c/o. Taking meds as prescribed  Past Medical History:  Diagnosis Date  . Allergy   . Asthma   . Atypical chest pain   . Chronic sinusitis    Followed by Dr. Jenne Pane  . Environmental allergies   . GERD (gastroesophageal reflux disease)   . 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 History of chicken pox   . Hypercalcemia 07/19/2015  . Osteoporosis, unspecified    steroid induced  . Personal history of other diseases of circulatory system   . Rheumatoid arthritis(714.0)   . Unspecified deficiency anemia    microcytic  . Urticaria     Past Surgical History:  Procedure Laterality Date  . NASAL SINUS SURGERY  06/2012    Family History  Problem Relation Age of Onset  . Liver cancer Maternal Grandmother   . Hypertension Maternal Grandmother   . Heart attack Maternal Grandmother   . Scleroderma Mother   . Heart disease Mother   . Kidney disease Mother   . Prostate  cancer Father   . Diabetes Father   . Heart disease Father        CHF  . Arthritis Father        s/p hip replacement  . Cancer Father        lung cancer with mets, smoker  . Lung cancer Paternal Uncle   . Coronary artery disease Paternal Grandmother   . Hypertension Paternal Grandmother   . Liver cancer Paternal Grandmother   . Hyperlipidemia Son   . Alzheimer's disease Paternal Grandfather   . Other Sister        Cardiomegaly  . Arthritis Sister   . Heart disease Sister        rare  . Sleep apnea Brother   . Arthritis Daughter   . Lupus Cousin   . Arthritis Cousin   . Colon cancer Neg Hx     Social History   Socioeconomic History  . Marital status: Married    Spouse name: Not on file  . Number of children: 2  . Years of education: Not on file  . Highest education level: Not on file  Occupational History  . Occupation: Data processing manager  Tobacco Use  . Smoking status: Never Smoker  . Smokeless tobacco: Never Used  Vaping Use  . Vaping Use: Never used  Substance and Sexual Activity  . Alcohol use: No    Alcohol/week: 0.0 standard drinks  .  Drug use: No  . Sexual activity: Not Currently  Other Topics Concern  . Not on file  Social History Narrative  . Not on file   Social Determinants of Health   Financial Resource Strain:   . Difficulty of Paying Living Expenses:   Food Insecurity:   . Worried About Programme researcher, broadcasting/film/video in the Last Year:   . Barista in the Last Year:   Transportation Needs:   . Freight forwarder (Medical):   Marland Kitchen Lack of Transportation (Non-Medical):   Physical Activity:   . Days of Exercise per Week:   . Minutes of Exercise per Session:   Stress:   . Feeling of Stress :   Social Connections:   . Frequency of Communication with Friends and Family:   . Frequency of Social Gatherings with Friends and Family:   . Attends Religious Services:   . Active Member of Clubs or Organizations:   . Attends Banker  Meetings:   Marland Kitchen Marital Status:   Intimate Partner Violence:   . Fear of Current or Ex-Partner:   . Emotionally Abused:   Marland Kitchen Physically Abused:   . Sexually Abused:     Outpatient Medications Prior to Visit  Medication Sig Dispense Refill  . albuterol (VENTOLIN HFA) 108 (90 Base) MCG/ACT inhaler INHALE ONE TO TWO PUFFS INTO THE LUNGS EVERY 6 HOURS AS NEEDED FOR WHEEZING OR SHORTNESS OF BREATH 18 g 5  . budesonide-formoterol (SYMBICORT) 80-4.5 MCG/ACT inhaler INHALE 2 PUFFS INTO THE LUNGS TWO TIMES DAILY 1 Inhaler 1  . fluticasone (FLONASE) 50 MCG/ACT nasal spray Place 2 sprays into both nostrils daily. 16 g 1  . gabapentin (NEURONTIN) 300 MG capsule Take 1 capsule (300 mg total) by mouth 3 (three) times daily. 90 capsule 3  . HEMATINIC/FOLIC ACID 324-1 MG TABS TAKE 1 TABLET BY MOUTH EVERY DAY 90 tablet 1  . HYDROcodone-acetaminophen (NORCO/VICODIN) 5-325 MG tablet Take 1 tablet by mouth every 6 (six) hours as needed for moderate pain or severe pain. 40 tablet 0  . levocetirizine (XYZAL) 5 MG tablet Take 1 tablet (5 mg total) by mouth every evening. 30 tablet 0  . meloxicam (MOBIC) 7.5 MG tablet Take 7.5 mg by mouth daily.    . mupirocin ointment (BACTROBAN) 2 % Apply 1 application topically 2 (two) times daily. 30 g 0  . pantoprazole (PROTONIX) 40 MG tablet Take 1 tablet by mouth once daily 90 tablet 0  . famotidine (PEPCID) 40 MG tablet TAKE 1 TABLET BY MOUTH EVERY DAY 30 tablet 0  . metoprolol succinate (TOPROL-XL) 25 MG 24 hr tablet TAKE 1 TABLET BY MOUTH ONCE DAILY. PATIENT NEEDS FOLLOW UP WITH PCP. 30 tablet 0  . predniSONE (DELTASONE) 2.5 MG tablet Take 1 tablet by mouth once daily with breakfast 90 tablet 0  . azithromycin (ZITHROMAX) 250 MG tablet Take 2 tablets by mouth on day 1, followed by 1 tablet by mouth daily for 4 days. 6 tablet 0  . fluticasone (FLONASE) 50 MCG/ACT nasal spray Place 2 sprays into both nostrils daily.     No facility-administered medications prior to visit.     Allergies  Allergen Reactions  . Avelox [Moxifloxacin Hcl In Nacl] Diarrhea, Nausea And Vomiting and Other (See Comments)    Dizziness, Cold chills   . Bactrim [Sulfamethoxazole-Trimethoprim] Hives  . Sulfamethoxazole-Trimethoprim Hives    Review of Systems  Constitutional: Positive for malaise/fatigue. Negative for fever.  HENT: Positive for congestion.   Eyes: Negative  for blurred vision.  Respiratory: Negative for sputum production and shortness of breath.   Cardiovascular: Negative for chest pain, palpitations and leg swelling.  Gastrointestinal: Negative for abdominal pain, blood in stool and nausea.  Genitourinary: Negative for dysuria and frequency.  Musculoskeletal: Positive for joint pain and myalgias. Negative for falls.  Skin: Negative for rash.  Neurological: Negative for dizziness, loss of consciousness and headaches.  Endo/Heme/Allergies: Negative for environmental allergies.  Psychiatric/Behavioral: Negative for depression. The patient is not nervous/anxious.        Objective:    Physical Exam unable to obtain via phone visit  There were no vitals taken for this visit. Wt Readings from Last 3 Encounters:  10/11/19 112 lb 2 oz (50.9 kg)  09/21/19 108 lb 0.4 oz (49 kg)  07/15/19 108 lb (49 kg)    Diabetic Foot Exam - Simple   No data filed     Lab Results  Component Value Date   WBC 12.2 (H) 09/21/2019   HGB 12.7 09/21/2019   HCT 40.5 09/21/2019   PLT 230 09/21/2019   GLUCOSE 224 (H) 09/21/2019   CHOL 205 (H) 07/09/2018   TRIG 55.0 07/09/2018   HDL 90.00 07/09/2018   LDLCALC 104 (H) 07/09/2018   ALT 22 09/21/2019   AST 25 09/21/2019   NA 136 09/21/2019   K 4.8 09/21/2019   CL 103 09/21/2019   CREATININE 1.69 (H) 09/21/2019   BUN 21 09/21/2019   CO2 19 (L) 09/21/2019   TSH 0.88 07/09/2018    Lab Results  Component Value Date   TSH 0.88 07/09/2018   Lab Results  Component Value Date   WBC 12.2 (H) 09/21/2019   HGB 12.7 09/21/2019    HCT 40.5 09/21/2019   MCV 73.5 (L) 09/21/2019   PLT 230 09/21/2019   Lab Results  Component Value Date   NA 136 09/21/2019   K 4.8 09/21/2019   CO2 19 (L) 09/21/2019   GLUCOSE 224 (H) 09/21/2019   BUN 21 09/21/2019   CREATININE 1.69 (H) 09/21/2019   BILITOT 0.5 09/21/2019   ALKPHOS 70 09/21/2019   AST 25 09/21/2019   ALT 22 09/21/2019   PROT 9.6 (H) 09/21/2019   ALBUMIN 4.4 09/21/2019   CALCIUM 10.3 09/21/2019   ANIONGAP 14 09/21/2019   GFR 78.33 07/09/2018   Lab Results  Component Value Date   CHOL 205 (H) 07/09/2018   Lab Results  Component Value Date   HDL 90.00 07/09/2018   Lab Results  Component Value Date   LDLCALC 104 (H) 07/09/2018   Lab Results  Component Value Date   TRIG 55.0 07/09/2018   Lab Results  Component Value Date   CHOLHDL 2 07/09/2018   No results found for: HGBA1C     Assessment & Plan:   Problem List Items Addressed This Visit    Osteoporosis    Encouraged to get adequate exercise, calcium and vitamin d intake      Relevant Orders   TSH   Chronic sinusitis   Relevant Medications   predniSONE (DELTASONE) 2.5 MG tablet   Other Relevant Orders   CBC   Tachycardia    Well controlled on Metoprolol. Refill given today      GERD (gastroesophageal reflux disease) - Primary   Intercostal muscle pain   High serum parathyroid hormone (PTH)   Relevant Orders   Lipid panel   PTH, Intact and Calcium   COVID-19    She has recovered well and has been fully vaccinated.  Allergy    Continue current meds, refill given on Singulair      On prednisone therapy    Refill given on Prednisone today       Other Visit Diagnoses    Hyperglycemia       Relevant Orders   Comprehensive metabolic panel   TSH   Lipid panel   Hemoglobin A1c      I have discontinued Syncere L. Bertholf's azithromycin. I have also changed her predniSONE. Additionally, I am having her start on montelukast. Lastly, I am having her maintain her albuterol,  Hematinic/Folic Acid, gabapentin, pantoprazole, budesonide-formoterol, mupirocin ointment, meloxicam, levocetirizine, fluticasone, HYDROcodone-acetaminophen, and metoprolol succinate.  Meds ordered this encounter  Medications  . predniSONE (DELTASONE) 2.5 MG tablet    Sig: Take 1 tablet (2.5 mg total) by mouth daily with breakfast.    Dispense:  30 tablet    Refill:  5  . metoprolol succinate (TOPROL-XL) 25 MG 24 hr tablet    Sig: TAKE 1 TABLET BY MOUTH ONCE DAILY. PATIENT NEEDS FOLLOW UP WITH PCP.    Dispense:  30 tablet    Refill:  5  . montelukast (SINGULAIR) 10 MG tablet    Sig: Take 1 tablet (10 mg total) by mouth at bedtime.    Dispense:  30 tablet    Refill:  5     I discussed the assessment and treatment plan with the patient. The patient was provided an opportunity to ask questions and all were answered. The patient agreed with the plan and demonstrated an understanding of the instructions.   The patient was advised to call back or seek an in-person evaluation if the symptoms worsen or if the condition fails to improve as anticipated.  I provided 25 minutes of non-face-to-face time during this encounter.   Danise Edge, MD

## 2019-11-18 NOTE — Assessment & Plan Note (Signed)
Well controlled on Metoprolol. Refill given today

## 2019-11-18 NOTE — Assessment & Plan Note (Signed)
Refill given on Prednisone today

## 2019-12-06 ENCOUNTER — Other Ambulatory Visit (INDEPENDENT_AMBULATORY_CARE_PROVIDER_SITE_OTHER): Payer: BC Managed Care – PPO

## 2019-12-06 DIAGNOSIS — J328 Other chronic sinusitis: Secondary | ICD-10-CM

## 2019-12-06 DIAGNOSIS — R739 Hyperglycemia, unspecified: Secondary | ICD-10-CM | POA: Diagnosis not present

## 2019-12-06 DIAGNOSIS — M810 Age-related osteoporosis without current pathological fracture: Secondary | ICD-10-CM

## 2019-12-06 DIAGNOSIS — R7989 Other specified abnormal findings of blood chemistry: Secondary | ICD-10-CM | POA: Diagnosis not present

## 2019-12-06 LAB — CBC
HCT: 32.4 % — ABNORMAL LOW (ref 36.0–46.0)
Hemoglobin: 10.2 g/dL — ABNORMAL LOW (ref 12.0–15.0)
MCHC: 31.6 g/dL (ref 30.0–36.0)
MCV: 73.4 fl — ABNORMAL LOW (ref 78.0–100.0)
Platelets: 186 10*3/uL (ref 150.0–400.0)
RBC: 4.41 Mil/uL (ref 3.87–5.11)
RDW: 15.8 % — ABNORMAL HIGH (ref 11.5–15.5)
WBC: 4.6 10*3/uL (ref 4.0–10.5)

## 2019-12-06 LAB — LIPID PANEL
Cholesterol: 190 mg/dL (ref 0–200)
HDL: 75.4 mg/dL (ref >39.00)
LDL Cholesterol: 94 mg/dL (ref 0–99)
NonHDL: 114.33
Total CHOL/HDL Ratio: 3
Triglycerides: 103 mg/dL (ref 0.0–149.0)
VLDL: 20.6 mg/dL (ref 0.0–40.0)

## 2019-12-06 LAB — COMPREHENSIVE METABOLIC PANEL
ALT: 15 U/L (ref 0–35)
AST: 17 U/L (ref 0–37)
Albumin: 3.9 g/dL (ref 3.5–5.2)
Alkaline Phosphatase: 61 U/L (ref 39–117)
BUN: 18 mg/dL (ref 6–23)
CO2: 27 mEq/L (ref 19–32)
Calcium: 9.6 mg/dL (ref 8.4–10.5)
Chloride: 105 mEq/L (ref 96–112)
Creatinine, Ser: 0.81 mg/dL (ref 0.40–1.20)
GFR: 86.91 mL/min (ref >60.00)
Glucose, Bld: 102 mg/dL — ABNORMAL HIGH (ref 70–99)
Potassium: 3.8 mEq/L (ref 3.5–5.1)
Sodium: 140 mEq/L (ref 135–145)
Total Bilirubin: 0.2 mg/dL (ref 0.2–1.2)
Total Protein: 7.4 g/dL (ref 6.0–8.3)

## 2019-12-06 LAB — TSH: TSH: 1.01 u[IU]/mL (ref 0.35–4.50)

## 2019-12-06 LAB — HEMOGLOBIN A1C: Hgb A1c MFr Bld: 6.5 % (ref 4.6–6.5)

## 2019-12-10 LAB — PTH, INTACT AND CALCIUM
Calcium: 9.6 mg/dL (ref 8.6–10.4)
PTH: 33 pg/mL (ref 14–64)

## 2019-12-13 ENCOUNTER — Other Ambulatory Visit: Payer: Self-pay | Admitting: *Deleted

## 2019-12-13 DIAGNOSIS — D619 Aplastic anemia, unspecified: Secondary | ICD-10-CM

## 2019-12-13 DIAGNOSIS — D649 Anemia, unspecified: Secondary | ICD-10-CM

## 2019-12-23 ENCOUNTER — Other Ambulatory Visit: Payer: Self-pay

## 2019-12-23 ENCOUNTER — Telehealth: Payer: Self-pay | Admitting: Family Medicine

## 2019-12-23 ENCOUNTER — Other Ambulatory Visit (INDEPENDENT_AMBULATORY_CARE_PROVIDER_SITE_OTHER): Payer: BC Managed Care – PPO

## 2019-12-23 ENCOUNTER — Other Ambulatory Visit: Payer: Self-pay | Admitting: Family Medicine

## 2019-12-23 DIAGNOSIS — D649 Anemia, unspecified: Secondary | ICD-10-CM | POA: Diagnosis not present

## 2019-12-23 DIAGNOSIS — M069 Rheumatoid arthritis, unspecified: Secondary | ICD-10-CM

## 2019-12-23 LAB — CBC
HCT: 32.5 % — ABNORMAL LOW (ref 36.0–46.0)
Hemoglobin: 10.4 g/dL — ABNORMAL LOW (ref 12.0–15.0)
MCHC: 31.8 g/dL (ref 30.0–36.0)
MCV: 73.8 fl — ABNORMAL LOW (ref 78.0–100.0)
Platelets: 193 10*3/uL (ref 150.0–400.0)
RBC: 4.41 Mil/uL (ref 3.87–5.11)
RDW: 15.8 % — ABNORMAL HIGH (ref 11.5–15.5)
WBC: 3.1 10*3/uL — ABNORMAL LOW (ref 4.0–10.5)

## 2019-12-23 LAB — IBC + FERRITIN
Ferritin: 751.5 ng/mL — ABNORMAL HIGH (ref 10.0–291.0)
Iron: 46 ug/dL (ref 42–145)
Saturation Ratios: 17.5 % — ABNORMAL LOW (ref 20.0–50.0)
Transferrin: 188 mg/dL — ABNORMAL LOW (ref 212.0–360.0)

## 2019-12-23 LAB — IRON: Iron: 46 ug/dL (ref 42–145)

## 2019-12-23 MED ORDER — HYDROCODONE-ACETAMINOPHEN 5-325 MG PO TABS: 1.0000 | ORAL_TABLET | Freq: Four times a day (QID) | ORAL | 0 refills | Status: DC | PRN

## 2019-12-23 NOTE — Telephone Encounter (Signed)
Last written: 11/01/19 Last ov: 11/14/19 Next ov: 02/20/20 Contract: will get at next visit UDS: will get at next visit

## 2019-12-23 NOTE — Telephone Encounter (Signed)
Medication:HYDROcodone-acetaminophen (NORCO/VICODIN) 5-325 MG   Has the patient contacted their pharmacy? No. (If no, request that the patient contact the pharmacy for the refill.) (If yes, when and what did the pharmacy advise?)  Preferred Pharmacy (with phone number or street name):  [id number] - [place] ([place]), [place] - [address]  [address] [place] ([place]) [place] [address]  Phone:  [phone] Fax:  [phone]  Agent: Please be advised that RX refills may take up to 3 business days. We ask that you follow-up with your pharmacy.

## 2019-12-23 NOTE — Telephone Encounter (Signed)
done

## 2019-12-26 ENCOUNTER — Other Ambulatory Visit: Payer: Self-pay | Admitting: Family Medicine

## 2019-12-31 ENCOUNTER — Ambulatory Visit: Payer: BC Managed Care – PPO | Admitting: Podiatry

## 2020-01-26 ENCOUNTER — Other Ambulatory Visit: Payer: Self-pay | Admitting: Family Medicine

## 2020-02-03 ENCOUNTER — Other Ambulatory Visit: Payer: Self-pay | Admitting: Family Medicine

## 2020-02-20 ENCOUNTER — Ambulatory Visit: Payer: BC Managed Care – PPO | Admitting: Family Medicine

## 2020-02-20 DIAGNOSIS — Z0289 Encounter for other administrative examinations: Secondary | ICD-10-CM

## 2020-03-07 ENCOUNTER — Telehealth (INDEPENDENT_AMBULATORY_CARE_PROVIDER_SITE_OTHER): Payer: BC Managed Care – PPO | Admitting: Family Medicine

## 2020-03-07 DIAGNOSIS — J328 Other chronic sinusitis: Secondary | ICD-10-CM | POA: Diagnosis not present

## 2020-03-07 DIAGNOSIS — M069 Rheumatoid arthritis, unspecified: Secondary | ICD-10-CM

## 2020-03-07 MED ORDER — METHYLPREDNISOLONE 4 MG PO TABS: ORAL_TABLET | ORAL | 0 refills | Status: DC

## 2020-03-07 MED ORDER — CEFDINIR 300 MG PO CAPS
300.0000 mg | ORAL_CAPSULE | Freq: Two times a day (BID) | ORAL | 0 refills | Status: AC
Start: 2020-03-07 — End: 2020-03-17

## 2020-03-07 MED ORDER — HYDROCODONE-HOMATROPINE 5-1.5 MG/5ML PO SYRP: 5.0000 mL | ORAL_SOLUTION | Freq: Every evening | ORAL | 0 refills | Status: DC | PRN

## 2020-03-07 MED ORDER — HYDROCODONE-ACETAMINOPHEN 5-325 MG PO TABS: 1.0000 | ORAL_TABLET | Freq: Four times a day (QID) | ORAL | 0 refills | Status: DC | PRN

## 2020-03-07 NOTE — Assessment & Plan Note (Addendum)
She has had increased congestion with cough and rhinorrhea productive of green sputum. She denies any f/c/n/v/anorexia. Does acknowledge occasional diarrhea but not worse than normal. Cough is causing gagging and constant at time. Started on Cefdinir, hydromet qhs, probiotics. Encouraged increased rest and hydration, add zinc such as Coldeze or Xicam. Treat fevers as needed. Also given a medrol dose pak

## 2020-03-07 NOTE — Assessment & Plan Note (Signed)
Given refill on Hydrocodone and she does continue to manage her pain and work full time still

## 2020-03-07 NOTE — Progress Notes (Signed)
Virtual Visit via phone Note  I connected with [name] on 03/07/20 at 10:40 AM EDT by a phone enabled telemedicine application and verified that I am speaking with the correct person using two identifiers.  Location: Patient: home, patient and provider in visit Provider: office   I discussed the limitations of evaluation and management by telemedicine and the availability of in person appointments. The patient expressed understanding and agreed to proceed. Tawni Carnes, CMA able to get the patient set up on a phone visit after being unable to set up a video visit   Subjective:    Patient ID: Jamie Cox, female    DOB: Jan 17, 1961, 59 y.o.   MRN: [id number]  Chief Complaint  Patient presents with  . headache,sinus concerns    [phone] call    HPI Patient is in today for evaluaiton of cough. She has a chronic coughfor which she sees numerous specialists but she has been notably worse over the past 2 months with increased cough productive of green phlegm.  In the past week she has developed increased headache and most notably pain behind left eye, increased fatigue and sinus congestion.  She notes postnasal drip and malaise.  She denies fevers and chills.  She has been taking Tylenol twice daily so she wonders if that has suppressed the fevers.  She is out of Jamie Cox pain medicines but continues to work noting daily pain that she works through.. Denies CP/palp/fevers or GU c/o. Taking meds as prescribed  Past Medical History:  Diagnosis Date  . Allergy   . Asthma   . Atypical chest pain   . Chronic sinusitis    Followed by Dr. Jenne Pane  . Environmental allergies   . GERD (gastroesophageal reflux disease)   . 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 History of chicken pox   . Hypercalcemia 07/19/2015  . Osteoporosis, unspecified    steroid induced  . Personal history of other diseases of circulatory system   . Rheumatoid arthritis(714.0)   . Unspecified  deficiency anemia    microcytic  . Urticaria     Past Surgical History:  Procedure Laterality Date  . NASAL SINUS SURGERY  06/2012    Family History  Problem Relation Age of Onset  . Liver cancer Maternal Grandmother   . Hypertension Maternal Grandmother   . Heart attack Maternal Grandmother   . Scleroderma Mother   . Heart disease Mother   . Kidney disease Mother   . Prostate cancer Father   . Diabetes Father   . Heart disease Father        CHF  . Arthritis Father        s/p hip replacement  . Cancer Father        lung cancer with mets, smoker  . Lung cancer Paternal Uncle   . Coronary artery disease Paternal Grandmother   . Hypertension Paternal Grandmother   . Liver cancer Paternal Grandmother   . Hyperlipidemia Son   . Alzheimer's disease Paternal Grandfather   . Other Sister        Cardiomegaly  . Arthritis Sister   . Heart disease Sister        rare  . Sleep apnea Brother   . Arthritis Daughter   . Lupus Cousin   . Arthritis Cousin   . Colon cancer Neg Hx     Social History   Socioeconomic History  . Marital status: Married    Spouse name: Not on file  .  Number of children: 2  . Years of education: Not on file  . Highest education level: Not on file  Occupational History  . Occupation: Data processing manager  Tobacco Use  . Smoking status: Never Smoker  . Smokeless tobacco: Never Used  Vaping Use  . Vaping Use: Never used  Substance and Sexual Activity  . Alcohol use: No    Alcohol/week: 0.0 standard drinks  . Drug use: No  . Sexual activity: Not Currently  Other Topics Concern  . Not on file  Social History Narrative  . Not on file   Social Determinants of Health   Financial Resource Strain:   . Difficulty of Paying Living Expenses: Not on file  Food Insecurity:   . Worried About Programme researcher, broadcasting/film/video in the Last Year: Not on file  . Ran Out of Food in the Last Year: Not on file  Transportation Needs:   . Lack of Transportation (Medical):  Not on file  . Lack of Transportation (Non-Medical): Not on file  Physical Activity:   . Days of Exercise per Week: Not on file  . Minutes of Exercise per Session: Not on file  Stress:   . Feeling of Stress : Not on file  Social Connections:   . Frequency of Communication with Friends and Family: Not on file  . Frequency of Social Gatherings with Friends and Family: Not on file  . Attends Religious Services: Not on file  . Active Member of Clubs or Organizations: Not on file  . Attends Banker Meetings: Not on file  . Marital Status: Not on file  Intimate Partner Violence:   . Fear of Current or Ex-Partner: Not on file  . Emotionally Abused: Not on file  . Physically Abused: Not on file  . Sexually Abused: Not on file    Outpatient Medications Prior to Visit  Medication Sig Dispense Refill  . albuterol (VENTOLIN HFA) 108 (90 Base) MCG/ACT inhaler INHALE ONE TO TWO PUFFS INTO THE LUNGS EVERY 6 HOURS AS NEEDED FOR WHEEZING OR SHORTNESS OF BREATH 18 g 5  . budesonide-formoterol (SYMBICORT) 80-4.5 MCG/ACT inhaler INHALE 2 PUFFS INTO THE LUNGS TWO TIMES DAILY 1 Inhaler 1  . famotidine (PEPCID) 40 MG tablet TAKE 1 TABLET BY MOUTH EVERY DAY 30 tablet 0  . fluticasone (FLONASE) 50 MCG/ACT nasal spray Place 2 sprays into both nostrils daily. 16 g 1  . gabapentin (NEURONTIN) 300 MG capsule Take 1 capsule (300 mg total) by mouth 3 (three) times daily. 90 capsule 3  . HEMATINIC/FOLIC ACID 324-1 MG TABS TAKE 1 TABLET BY MOUTH EVERY DAY 90 tablet 1  . levocetirizine (XYZAL) 5 MG tablet Take 1 tablet (5 mg total) by mouth every evening. 30 tablet 0  . meloxicam (MOBIC) 7.5 MG tablet TAKE 1 TO 2 TABLETS BY MOUTH ONCE DAILY AS NEEDED FOR PAIN 30 tablet 7  . metoprolol succinate (TOPROL-XL) 25 MG 24 hr tablet TAKE 1 TABLET BY MOUTH ONCE DAILY. PATIENT NEEDS FOLLOW UP WITH PCP. 30 tablet 5  . montelukast (SINGULAIR) 10 MG tablet Take 1 tablet (10 mg total) by mouth at bedtime. 30 tablet 5    . mupirocin ointment (BACTROBAN) 2 % Apply 1 application topically 2 (two) times daily. 30 g 0  . pantoprazole (PROTONIX) 40 MG tablet Take 1 tablet by mouth once daily 90 tablet 0  . predniSONE (DELTASONE) 2.5 MG tablet Take 1 tablet (2.5 mg total) by mouth daily with breakfast. 30 tablet 5  . HYDROcodone-acetaminophen (  NORCO/VICODIN) 5-325 MG tablet Take 1 tablet by mouth every 6 (six) hours as needed for moderate pain or severe pain. 40 tablet 0   No facility-administered medications prior to visit.    Allergies  Allergen Reactions  . Avelox [Moxifloxacin Hcl In Nacl] Diarrhea, Nausea And Vomiting and Other (See Comments)    Dizziness, Cold chills   . Bactrim [Sulfamethoxazole-Trimethoprim] Hives  . Doxycycline     Abdominal discomfort, anorexia  . Sulfamethoxazole-Trimethoprim Hives    Review of Systems  Constitutional: Positive for malaise/fatigue. Negative for fever.  HENT: Positive for congestion, sinus pain and sore throat.   Eyes: Negative for blurred vision.  Respiratory: Positive for cough, sputum production, shortness of breath and wheezing.   Cardiovascular: Negative for chest pain, palpitations and leg swelling.  Gastrointestinal: Negative for abdominal pain, blood in stool and nausea.  Genitourinary: Negative for dysuria and frequency.  Musculoskeletal: Positive for myalgias. Negative for falls.  Skin: Negative for rash.  Neurological: Negative for dizziness, loss of consciousness and headaches.  Endo/Heme/Allergies: Negative for environmental allergies.  Psychiatric/Behavioral: Negative for depression. The patient is not nervous/anxious.        Objective:    Physical Exam Unable to obtain via phone visit  Ht 5\' 1"  (1.549 m)   Wt 108 lb (49 kg)   BMI 20.41 kg/m  Wt Readings from Last 3 Encounters:  03/07/20 108 lb (49 kg)  10/11/19 112 lb 2 oz (50.9 kg)  09/21/19 108 lb 0.4 oz (49 kg)    Diabetic Foot Exam - Simple   No data filed     Lab Results   Component Value Date   WBC 3.1 (L) 12/23/2019   HGB 10.4 (L) 12/23/2019   HCT 32.5 (L) 12/23/2019   PLT 193.0 12/23/2019   GLUCOSE 102 (H) 12/06/2019   CHOL 190 12/06/2019   TRIG 103.0 12/06/2019   HDL 75.40 12/06/2019   LDLCALC 94 12/06/2019   ALT 15 12/06/2019   AST 17 12/06/2019   NA 140 12/06/2019   K 3.8 12/06/2019   CL 105 12/06/2019   CREATININE 0.81 12/06/2019   BUN 18 12/06/2019   CO2 27 12/06/2019   TSH 1.01 12/06/2019   HGBA1C 6.5 12/06/2019    Lab Results  Component Value Date   TSH 1.01 12/06/2019   Lab Results  Component Value Date   WBC 3.1 (L) 12/23/2019   HGB 10.4 (L) 12/23/2019   HCT 32.5 (L) 12/23/2019   MCV 73.8 (L) 12/23/2019   PLT 193.0 12/23/2019   Lab Results  Component Value Date   NA 140 12/06/2019   K 3.8 12/06/2019   CO2 27 12/06/2019   GLUCOSE 102 (H) 12/06/2019   BUN 18 12/06/2019   CREATININE 0.81 12/06/2019   BILITOT 0.2 12/06/2019   ALKPHOS 61 12/06/2019   AST 17 12/06/2019   ALT 15 12/06/2019   PROT 7.4 12/06/2019   ALBUMIN 3.9 12/06/2019   CALCIUM 9.6 12/06/2019   CALCIUM 9.6 12/06/2019   ANIONGAP 14 09/21/2019   GFR 86.91 12/06/2019   Lab Results  Component Value Date   CHOL 190 12/06/2019   Lab Results  Component Value Date   HDL 75.40 12/06/2019   Lab Results  Component Value Date   LDLCALC 94 12/06/2019   Lab Results  Component Value Date   TRIG 103.0 12/06/2019   Lab Results  Component Value Date   CHOLHDL 3 12/06/2019   Lab Results  Component Value Date   HGBA1C 6.5 12/06/2019  Assessment & Plan:   Problem List Items Addressed This Visit    Rheumatoid arthritis (HCC)    Given refill on Hydrocodone and she does continue to manage Jamie Cox pain and work full time still      Relevant Medications   methylPREDNISolone (MEDROL) 4 MG tablet   HYDROcodone-acetaminophen (NORCO/VICODIN) 5-325 MG tablet   Chronic sinusitis    She has had increased congestion with cough and rhinorrhea productive  of green sputum. She denies any f/c/n/v/anorexia. Does acknowledge occasional diarrhea but not worse than normal. Cough is causing gagging and constant at time. Started on Cefdinir, hydromet qhs, probiotics. Encouraged increased rest and hydration, add zinc such as Coldeze or Xicam. Treat fevers as needed. Also given a medrol dose pak      Relevant Medications   cefdinir (OMNICEF) 300 MG capsule   methylPREDNISolone (MEDROL) 4 MG tablet   HYDROcodone-homatropine (HYCODAN) 5-1.5 MG/5ML syrup      I am having Jamie Cox start on cefdinir, methylPREDNISolone, and HYDROcodone-homatropine. I am also having Jamie Cox maintain Jamie Cox albuterol, Hematinic/Folic Acid, gabapentin, pantoprazole, budesonide-formoterol, mupirocin ointment, levocetirizine, fluticasone, predniSONE, metoprolol succinate, montelukast, meloxicam, famotidine, and HYDROcodone-acetaminophen.  Meds ordered this encounter  Medications  . cefdinir (OMNICEF) 300 MG capsule    Sig: Take 1 capsule (300 mg total) by mouth 2 (two) times daily for 10 days.    Dispense:  20 capsule    Refill:  0  . methylPREDNISolone (MEDROL) 4 MG tablet    Sig: 5 tab po qd X 1d then 4 tab po qd X 1d then 3 tab po qd X 1d then 2 tab po qd then 1 tab po qd    Dispense:  15 tablet    Refill:  0  . HYDROcodone-acetaminophen (NORCO/VICODIN) 5-325 MG tablet    Sig: Take 1 tablet by mouth every 6 (six) hours as needed for moderate pain or severe pain.    Dispense:  40 tablet    Refill:  0  . HYDROcodone-homatropine (HYCODAN) 5-1.5 MG/5ML syrup    Sig: Take 5 mLs by mouth at bedtime as needed for cough.    Dispense:  120 mL    Refill:  0     I discussed the assessment and treatment plan with the patient. The patient was provided an opportunity to ask questions and all were answered. The patient agreed with the plan and demonstrated an understanding of the instructions.   The patient was advised to call back or seek an in-person evaluation if the symptoms  worsen or if the condition fails to improve as anticipated.  I provided 21 minutes of non-face-to-face time during this encounter.   Danise Edge, MD

## 2020-03-15 ENCOUNTER — Other Ambulatory Visit: Payer: Self-pay | Admitting: Family Medicine

## 2020-03-16 ENCOUNTER — Telehealth: Payer: Self-pay | Admitting: Family Medicine

## 2020-03-16 NOTE — Telephone Encounter (Signed)
Caller name: Soraida Call back [phone]   Patient states her sinus are better, however, now has diarrhea.  Patient is wondering if she needs more antibiotics. Please advise

## 2020-03-17 NOTE — Telephone Encounter (Signed)
For the diarrhea we need to check a stool for CDiff, stool for WBC and stool culture. If she agrees please order or send back to me and I can order.

## 2020-03-18 ENCOUNTER — Telehealth: Payer: Self-pay

## 2020-03-18 NOTE — Telephone Encounter (Signed)
Called patient and patient is doing better and diarrhea-like symptoms has gotten better.  Patient states does not have a problem for ordering stool culture & testing.    Question:  Would it be okay for me to write a work excuse note, for the dates of Oct. 5, Oct. 6, and Oct. 11 please? (Patient states was unable to go to work on those days)

## 2020-03-18 NOTE — Telephone Encounter (Signed)
Spoken with patient and requesting work excuse note for the following days 10/5, 10/6, and 10/11 due to extreme diarrhea-like symptoms without relief, patient states.  Patient also states is feeling much better today, and has no problem with stool culture and testing.  Have forwarded message to provider as well.

## 2020-03-20 NOTE — Telephone Encounter (Signed)
Stool has become formed . Pt letter place in front for pick up.

## 2020-03-20 NOTE — Telephone Encounter (Signed)
Yes please provide her with work note as requested. She is incredibly hard working and only misses work when absolutely necessary so I am fine if you could write her the note it would be appreciated

## 2020-03-20 NOTE — Telephone Encounter (Signed)
Also proceed with the testing but warn her if her stool becomes formed not to submit that sample for CDiff testing because they will not run it. They will only run loose stool for testing

## 2020-03-25 ENCOUNTER — Other Ambulatory Visit (INDEPENDENT_AMBULATORY_CARE_PROVIDER_SITE_OTHER): Payer: BC Managed Care – PPO

## 2020-03-25 DIAGNOSIS — D649 Anemia, unspecified: Secondary | ICD-10-CM

## 2020-03-25 LAB — FECAL OCCULT BLOOD, IMMUNOCHEMICAL: Fecal Occult Bld: NEGATIVE

## 2020-03-30 ENCOUNTER — Ambulatory Visit: Payer: BC Managed Care – PPO | Admitting: Podiatry

## 2020-03-31 ENCOUNTER — Other Ambulatory Visit (HOSPITAL_BASED_OUTPATIENT_CLINIC_OR_DEPARTMENT_OTHER): Payer: Self-pay | Admitting: Internal Medicine

## 2020-03-31 ENCOUNTER — Ambulatory Visit: Payer: BC Managed Care – PPO | Admitting: Family Medicine

## 2020-03-31 ENCOUNTER — Encounter: Payer: Self-pay | Admitting: Family Medicine

## 2020-03-31 ENCOUNTER — Other Ambulatory Visit: Payer: Self-pay

## 2020-03-31 VITALS — BP 104/66 | HR 86 | Temp 97.7°F | Wt 112.0 lb

## 2020-03-31 DIAGNOSIS — R52 Pain, unspecified: Secondary | ICD-10-CM

## 2020-03-31 DIAGNOSIS — M810 Age-related osteoporosis without current pathological fracture: Secondary | ICD-10-CM | POA: Diagnosis not present

## 2020-03-31 DIAGNOSIS — M069 Rheumatoid arthritis, unspecified: Secondary | ICD-10-CM | POA: Diagnosis not present

## 2020-03-31 DIAGNOSIS — J4540 Moderate persistent asthma, uncomplicated: Secondary | ICD-10-CM

## 2020-03-31 DIAGNOSIS — D649 Anemia, unspecified: Secondary | ICD-10-CM | POA: Diagnosis not present

## 2020-03-31 DIAGNOSIS — E119 Type 2 diabetes mellitus without complications: Secondary | ICD-10-CM

## 2020-03-31 DIAGNOSIS — R000 Tachycardia, unspecified: Secondary | ICD-10-CM

## 2020-03-31 DIAGNOSIS — R739 Hyperglycemia, unspecified: Secondary | ICD-10-CM

## 2020-03-31 DIAGNOSIS — R053 Chronic cough: Secondary | ICD-10-CM

## 2020-03-31 DIAGNOSIS — J0141 Acute recurrent pansinusitis: Secondary | ICD-10-CM

## 2020-03-31 DIAGNOSIS — R0789 Other chest pain: Secondary | ICD-10-CM

## 2020-03-31 DIAGNOSIS — J328 Other chronic sinusitis: Secondary | ICD-10-CM

## 2020-03-31 MED ORDER — FAMOTIDINE 40 MG PO TABS
40.0000 mg | ORAL_TABLET | Freq: Every day | ORAL | 1 refills | Status: DC
Start: 2020-03-31 — End: 2020-10-02

## 2020-03-31 MED ORDER — HYDROCODONE-HOMATROPINE 5-1.5 MG/5ML PO SYRP
5.0000 mL | ORAL_SOLUTION | Freq: Two times a day (BID) | ORAL | 0 refills | Status: DC | PRN
Start: 2020-03-31 — End: 2020-08-13

## 2020-03-31 MED ORDER — AMOXICILLIN-POT CLAVULANATE 875-125 MG PO TABS: 1.0000 | ORAL_TABLET | Freq: Two times a day (BID) | ORAL | 0 refills | Status: DC

## 2020-03-31 MED ORDER — HYDROCODONE-ACETAMINOPHEN 5-325 MG PO TABS: 1.0000 | ORAL_TABLET | Freq: Four times a day (QID) | ORAL | 0 refills | Status: DC | PRN

## 2020-03-31 MED ORDER — ALBUTEROL SULFATE HFA 108 (90 BASE) MCG/ACT IN AERS: INHALATION_SPRAY | RESPIRATORY_TRACT | 5 refills | Status: DC

## 2020-03-31 MED FILL — FLUARIX QUADRIVALENT 0.5 ML: 0.5 | 1 days supply | Qty: 1 | Fill #0

## 2020-03-31 NOTE — Patient Instructions (Signed)
Carbohydrate Counting for Diabetes Mellitus, Adult  Carbohydrate counting is a method of keeping track of how many carbohydrates you eat. Eating carbohydrates naturally increases the amount of sugar (glucose) in the blood. Counting how many carbohydrates you eat helps keep your blood glucose within normal limits, which helps you manage your diabetes (diabetes mellitus). It is important to know how many carbohydrates you can safely have in each meal. This is different for every person. A diet and nutrition specialist (registered dietitian) can help you make a meal plan and calculate how many carbohydrates you should have at each meal and snack. Carbohydrates are found in the following foods:  Grains, such as breads and cereals.  Dried beans and soy products.  Starchy vegetables, such as potatoes, peas, and corn.  Fruit and fruit juices.  Milk and yogurt.  Sweets and snack foods, such as cake, cookies, candy, chips, and soft drinks. How do I count carbohydrates? There are two ways to count carbohydrates in food. You can use either of the methods or a combination of both. Reading "Nutrition Facts" on packaged food The "Nutrition Facts" list is included on the labels of almost all packaged foods and beverages in the [place] It includes:  The serving size.  Information about nutrients in each serving, including the grams (g) of carbohydrate per serving. To use the "Nutrition Facts":  Decide how many servings you will have.  Multiply the number of servings by the number of carbohydrates per serving.  The resulting number is the total amount of carbohydrates that you will be having. Learning standard serving sizes of other foods When you eat carbohydrate foods that are not packaged or do not include "Nutrition Facts" on the label, you need to measure the servings in order to count the amount of carbohydrates:  Measure the foods that you will eat with a food scale or measuring cup, if  needed.  Decide how many standard-size servings you will eat.  Multiply the number of servings by 15. Most carbohydrate-rich foods have about 15 g of carbohydrates per serving. ? For example, if you eat 8 oz (170 g) of strawberries, you will have eaten 2 servings and 30 g of carbohydrates (2 servings x 15 g = 30 g).  For foods that have more than one food mixed, such as soups and casseroles, you must count the carbohydrates in each food that is included. The following list contains standard serving sizes of common carbohydrate-rich foods. Each of these servings has about 15 g of carbohydrates:   hamburger bun or  English muffin.   oz (15 mL) syrup.   oz (14 g) jelly.  1 slice of bread.  1 six-inch tortilla.  3 oz (85 g) cooked rice or pasta.  4 oz (113 g) cooked dried beans.  4 oz (113 g) starchy vegetable, such as peas, corn, or potatoes.  4 oz (113 g) hot cereal.  4 oz (113 g) mashed potatoes or  of a large baked potato.  4 oz (113 g) canned or frozen fruit.  4 oz (120 mL) fruit juice.  4-6 crackers.  6 chicken nuggets.  6 oz (170 g) unsweetened dry cereal.  6 oz (170 g) plain fat-free yogurt or yogurt sweetened with artificial sweeteners.  8 oz (240 mL) milk.  8 oz (170 g) fresh fruit or one small piece of fruit.  24 oz (680 g) popped popcorn. Example of carbohydrate counting Sample meal  3 oz (85 g) chicken breast.  6 oz (170 g)   brown rice.  4 oz (113 g) corn.  8 oz (240 mL) milk.  8 oz (170 g) strawberries with sugar-free whipped topping. Carbohydrate calculation 1. Identify the foods that contain carbohydrates: ? Rice. ? Corn. ? Milk. ? Strawberries. 2. Calculate how many servings you have of each food: ? 2 servings rice. ? 1 serving corn. ? 1 serving milk. ? 1 serving strawberries. 3. Multiply each number of servings by 15 g: ? 2 servings rice x 15 g = 30 g. ? 1 serving corn x 15 g = 15 g. ? 1 serving milk x 15 g = 15 g. ? 1  serving strawberries x 15 g = 15 g. 4. Add together all of the amounts to find the total grams of carbohydrates eaten: ? 30 g + 15 g + 15 g + 15 g = 75 g of carbohydrates total. Summary  Carbohydrate counting is a method of keeping track of how many carbohydrates you eat.  Eating carbohydrates naturally increases the amount of sugar (glucose) in the blood.  Counting how many carbohydrates you eat helps keep your blood glucose within normal limits, which helps you manage your diabetes.  A diet and nutrition specialist (registered dietitian) can help you make a meal plan and calculate how many carbohydrates you should have at each meal and snack. This information is not intended to replace advice given to you by your health care provider. Make sure you discuss any questions you have with your health care provider. Document Revised: 12/15/2016 Document Reviewed: 11/04/2015 Elsevier Patient Education  2020 Elsevier Inc.  

## 2020-04-01 ENCOUNTER — Other Ambulatory Visit: Payer: Self-pay

## 2020-04-01 ENCOUNTER — Telehealth: Payer: Self-pay

## 2020-04-01 LAB — CBC
HCT: 36.1 % (ref 35.0–45.0)
Hemoglobin: 11 g/dL — ABNORMAL LOW (ref 11.7–15.5)
MCH: 22.7 pg — ABNORMAL LOW (ref 27.0–33.0)
MCHC: 30.5 g/dL — ABNORMAL LOW (ref 32.0–36.0)
MCV: 74.4 fL — ABNORMAL LOW (ref 80.0–100.0)
Platelets: 266 10*3/uL (ref 140–400)
RBC: 4.85 10*6/uL (ref 3.80–5.10)
RDW: 17.1 % — ABNORMAL HIGH (ref 11.0–15.0)
WBC: 4.8 10*3/uL (ref 3.8–10.8)

## 2020-04-01 LAB — COMPREHENSIVE METABOLIC PANEL
AG Ratio: 1 (calc) (ref 1.0–2.5)
ALT: 16 U/L (ref 6–29)
AST: 22 U/L (ref 10–35)
Albumin: 4 g/dL (ref 3.6–5.1)
Alkaline phosphatase (APISO): 72 U/L (ref 37–153)
BUN: 14 mg/dL (ref 7–25)
CO2: 24 mmol/L (ref 20–32)
Calcium: 9.9 mg/dL (ref 8.6–10.4)
Chloride: 105 mmol/L (ref 98–110)
Creat: 0.71 mg/dL (ref 0.50–0.99)
Globulin: 4 g/dL (calc) — ABNORMAL HIGH (ref 1.9–3.7)
Glucose, Bld: 90 mg/dL (ref 65–99)
Potassium: 4.1 mmol/L (ref 3.5–5.3)
Sodium: 139 mmol/L (ref 135–146)
Total Bilirubin: 0.3 mg/dL (ref 0.2–1.2)
Total Protein: 8 g/dL (ref 6.1–8.1)

## 2020-04-01 LAB — LIPID PANEL
Cholesterol: 202 mg/dL — ABNORMAL HIGH (ref <200)
HDL: 72 mg/dL (ref >=50)
LDL Cholesterol (Calc): 110 mg/dL (calc) — ABNORMAL HIGH
Non-HDL Cholesterol (Calc): 130 mg/dL (calc) — ABNORMAL HIGH (ref <130)
Total CHOL/HDL Ratio: 2.8 (calc) (ref <5.0)
Triglycerides: 92 mg/dL (ref <150)

## 2020-04-01 LAB — TSH: TSH: 1.18 mIU/L (ref 0.40–4.50)

## 2020-04-01 LAB — HEMOGLOBIN A1C
Hgb A1c MFr Bld: 6.5 % of total Hgb — ABNORMAL HIGH (ref <5.7)
Mean Plasma Glucose: 140 (calc)
eAG (mmol/L): 7.7 (calc)

## 2020-04-01 LAB — VITAMIN D 25 HYDROXY (VIT D DEFICIENCY, FRACTURES): Vit D, 25-Hydroxy: 46 ng/mL (ref 30–100)

## 2020-04-01 MED ORDER — ATORVASTATIN CALCIUM 10 MG PO TABS: 10.0000 mg | ORAL_TABLET | Freq: Every day | ORAL | 2 refills | Status: DC

## 2020-04-01 NOTE — Assessment & Plan Note (Signed)
Stable on current medications, no flare.

## 2020-04-01 NOTE — Telephone Encounter (Signed)
Have her take Cetirizine 10 mg twice a day, Famotidine 20 mg twice a day and Benadryl 25 mg at night for the next week. They are all over the counter. If her symptoms get worse or do not improve she should seek care and/or let us know and we can consider a course of steroids.  °

## 2020-04-01 NOTE — Telephone Encounter (Signed)
Have her take Cetirizine 10 mg twice a day, Famotidine 20 mg twice a day and Benadryl 25 mg at night for the next week. They are all over the counter. If her symptoms get worse or do not improve she should seek care and/or let us know and we can consider a course of steroids.

## 2020-04-01 NOTE — Progress Notes (Signed)
Pt received information on lab results understands the new medication added and is uses.   Pt reports getting flu shot yesterday downstairs with the pharmacy had a reaction. Has never had a reaction previously but is reporting welts under arms, back of legs arm folds. Areas are itchy. Pt instructed to try Benadryl to help with discomforts . Information forwarded to provider for further instruction.

## 2020-04-01 NOTE — Telephone Encounter (Signed)
Pt reports allergic reaction from flu shot yesterday. Reports having welts in folds of body. Areas are itchy. No distress with breathing no emesis or restriction note by pt. Instructed to take over the counter benadryl to help ease discomforts. PCP informed of reaction to further advise

## 2020-04-01 NOTE — Assessment & Plan Note (Signed)
Continues to work full time and manages her pain with current pain meds.

## 2020-04-01 NOTE — Progress Notes (Signed)
Patient ID: Jamie Cox, female   DOB: [birth year], 59 y.o.   MRN: [id number]   Subjective:    Patient ID: Jamie Cox, female    DOB: 12-09-1960, 59 y.o.   MRN: [id number]  Chief Complaint  Patient presents with  . Follow-up    pt states that nasal muscus is green still with cough.  . Arthritis    HPI Patient is in today for follow up on chronic medical concerns. No recent febrile illness or hospitalizations but she has had significant head congestion, green rhinorrhea and malaise. She had a bad headache when this started but the steroid course helped that. Her chronic cough is stable and not flared. Her wheezing is also not flared. Denies CP/palp/SOB/HA/congestion/fevers/GI or GU c/o. Taking meds as prescribed  Past Medical History:  Diagnosis Date  . Allergy   . Asthma   . Atypical chest pain   . Chronic sinusitis    Followed by Dr. Jenne Pane  . Environmental allergies   . GERD (gastroesophageal reflux disease)   . 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 History of chicken pox   . Hypercalcemia 07/19/2015  . Osteoporosis, unspecified    steroid induced  . Personal history of other diseases of circulatory system   . Rheumatoid arthritis(714.0)   . Unspecified deficiency anemia    microcytic  . Urticaria     Past Surgical History:  Procedure Laterality Date  . NASAL SINUS SURGERY  06/2012    Family History  Problem Relation Age of Onset  . Liver cancer Maternal Grandmother   . Hypertension Maternal Grandmother   . Heart attack Maternal Grandmother   . Scleroderma Mother   . Heart disease Mother   . Kidney disease Mother   . Prostate cancer Father   . Diabetes Father   . Heart disease Father        CHF  . Arthritis Father        s/p hip replacement  . Cancer Father        lung cancer with mets, smoker  . Lung cancer Paternal Uncle   . Coronary artery disease Paternal Grandmother   . Hypertension Paternal Grandmother   . Liver cancer Paternal  Grandmother   . Hyperlipidemia Son   . Alzheimer's disease Paternal Grandfather   . Other Sister        Cardiomegaly  . Arthritis Sister   . Heart disease Sister        rare  . Sleep apnea Brother   . Arthritis Daughter   . Lupus Cousin   . Arthritis Cousin   . Colon cancer Neg Hx     Social History   Socioeconomic History  . Marital status: Married    Spouse name: Not on file  . Number of children: 2  . Years of education: Not on file  . Highest education level: Not on file  Occupational History  . Occupation: Data processing manager  Tobacco Use  . Smoking status: Never Smoker  . Smokeless tobacco: Never Used  Vaping Use  . Vaping Use: Never used  Substance and Sexual Activity  . Alcohol use: No    Alcohol/week: 0.0 standard drinks  . Drug use: No  . Sexual activity: Not Currently  Other Topics Concern  . Not on file  Social History Narrative  . Not on file   Social Determinants of Health   Financial Resource Strain:   . Difficulty of Paying Living Expenses: Not on file  Food Insecurity:   .  Worried About Programme researcher, broadcasting/film/video in the Last Year: Not on file  . Ran Out of Food in the Last Year: Not on file  Transportation Needs:   . Lack of Transportation (Medical): Not on file  . Lack of Transportation (Non-Medical): Not on file  Physical Activity:   . Days of Exercise per Week: Not on file  . Minutes of Exercise per Session: Not on file  Stress:   . Feeling of Stress : Not on file  Social Connections:   . Frequency of Communication with Friends and Family: Not on file  . Frequency of Social Gatherings with Friends and Family: Not on file  . Attends Religious Services: Not on file  . Active Member of Clubs or Organizations: Not on file  . Attends Banker Meetings: Not on file  . Marital Status: Not on file  Intimate Partner Violence:   . Fear of Current or Ex-Partner: Not on file  . Emotionally Abused: Not on file  . Physically Abused: Not on  file  . Sexually Abused: Not on file    Outpatient Medications Prior to Visit  Medication Sig Dispense Refill  . budesonide-formoterol (SYMBICORT) 80-4.5 MCG/ACT inhaler INHALE 2 PUFFS INTO THE LUNGS TWO TIMES DAILY 1 Inhaler 1  . fluticasone (FLONASE) 50 MCG/ACT nasal spray Place 2 sprays into both nostrils daily. 16 g 1  . gabapentin (NEURONTIN) 300 MG capsule Take 1 capsule (300 mg total) by mouth 3 (three) times daily. 90 capsule 3  . HEMATINIC/FOLIC ACID 324-1 MG TABS TAKE 1 TABLET BY MOUTH EVERY DAY 90 tablet 1  . levocetirizine (XYZAL) 5 MG tablet Take 1 tablet (5 mg total) by mouth every evening. 30 tablet 0  . meloxicam (MOBIC) 7.5 MG tablet TAKE 1 TO 2 TABLETS BY MOUTH ONCE DAILY AS NEEDED FOR PAIN 30 tablet 7  . metoprolol succinate (TOPROL-XL) 25 MG 24 hr tablet TAKE 1 TABLET BY MOUTH ONCE DAILY. PATIENT NEEDS FOLLOW UP WITH PCP. 30 tablet 5  . montelukast (SINGULAIR) 10 MG tablet Take 1 tablet (10 mg total) by mouth at bedtime. 30 tablet 5  . mupirocin ointment (BACTROBAN) 2 % Apply 1 application topically 2 (two) times daily. 30 g 0  . pantoprazole (PROTONIX) 40 MG tablet Take 1 tablet by mouth once daily 90 tablet 0  . predniSONE (DELTASONE) 2.5 MG tablet Take 1 tablet (2.5 mg total) by mouth daily with breakfast. 30 tablet 5  . albuterol (VENTOLIN HFA) 108 (90 Base) MCG/ACT inhaler INHALE ONE TO TWO PUFFS INTO THE LUNGS EVERY 6 HOURS AS NEEDED FOR WHEEZING OR SHORTNESS OF BREATH 18 g 5  . famotidine (PEPCID) 40 MG tablet TAKE 1 TABLET BY MOUTH EVERY DAY 30 tablet 0  . HYDROcodone-acetaminophen (NORCO/VICODIN) 5-325 MG tablet Take 1 tablet by mouth every 6 (six) hours as needed for moderate pain or severe pain. 40 tablet 0  . HYDROcodone-homatropine (HYCODAN) 5-1.5 MG/5ML syrup Take 5 mLs by mouth at bedtime as needed for cough. 120 mL 0  . methylPREDNISolone (MEDROL) 4 MG tablet 5 tab po qd X 1d then 4 tab po qd X 1d then 3 tab po qd X 1d then 2 tab po qd then 1 tab po qd 15  tablet 0   No facility-administered medications prior to visit.    Allergies  Allergen Reactions  . Avelox [Moxifloxacin Hcl In Nacl] Diarrhea, Nausea And Vomiting and Other (See Comments)    Dizziness, Cold chills   . Bactrim [Sulfamethoxazole-Trimethoprim] Hives  .  Doxycycline     Abdominal discomfort, anorexia  . Sulfamethoxazole-Trimethoprim Hives    Review of Systems  Constitutional: Positive for malaise/fatigue. Negative for fever.  HENT: Positive for congestion and sinus pain.   Eyes: Negative for blurred vision.  Respiratory: Positive for cough and sputum production. Negative for shortness of breath.   Cardiovascular: Negative for chest pain, palpitations and leg swelling.  Gastrointestinal: Negative for abdominal pain, blood in stool and nausea.  Genitourinary: Negative for dysuria and frequency.  Musculoskeletal: Positive for back pain and myalgias. Negative for falls.  Skin: Negative for rash.  Neurological: Negative for dizziness, loss of consciousness and headaches.  Endo/Heme/Allergies: Negative for environmental allergies.  Psychiatric/Behavioral: Negative for depression. The patient is not nervous/anxious.        Objective:    Physical Exam Vitals and nursing note reviewed.  Constitutional:      General: She is not in acute distress.    Appearance: She is well-developed.  HENT:     Head: Normocephalic and atraumatic.     Nose: Nose normal.  Eyes:     General:        Right eye: No discharge.        Left eye: No discharge.  Cardiovascular:     Rate and Rhythm: Normal rate and regular rhythm.     Heart sounds: No murmur heard.   Pulmonary:     Effort: Pulmonary effort is normal.     Breath sounds: Normal breath sounds.  Abdominal:     General: Bowel sounds are normal.     Palpations: Abdomen is soft.     Tenderness: There is no abdominal tenderness.  Musculoskeletal:     Cervical back: Normal range of motion and neck supple.  Skin:    General:  Skin is warm and dry.  Neurological:     Mental Status: She is alert and oriented to person, place, and time.     BP 104/66   Pulse 86   Temp 97.7 F (36.5 C) (Oral)   Wt 112 lb (50.8 kg)   BMI 21.16 kg/m  Wt Readings from Last 3 Encounters:  03/31/20 112 lb (50.8 kg)  03/07/20 108 lb (49 kg)  10/11/19 112 lb 2 oz (50.9 kg)    Diabetic Foot Exam - Simple   No data filed     Lab Results  Component Value Date   WBC 4.8 03/31/2020   HGB 11.0 (L) 03/31/2020   HCT 36.1 03/31/2020   PLT 266 03/31/2020   GLUCOSE 90 03/31/2020   CHOL 202 (H) 03/31/2020   TRIG 92 03/31/2020   HDL 72 03/31/2020   LDLCALC 110 (H) 03/31/2020   ALT 16 03/31/2020   AST 22 03/31/2020   NA 139 03/31/2020   K 4.1 03/31/2020   CL 105 03/31/2020   CREATININE 0.71 03/31/2020   BUN 14 03/31/2020   CO2 24 03/31/2020   TSH 1.18 03/31/2020   HGBA1C 6.5 (H) 03/31/2020    Lab Results  Component Value Date   TSH 1.18 03/31/2020   Lab Results  Component Value Date   WBC 4.8 03/31/2020   HGB 11.0 (L) 03/31/2020   HCT 36.1 03/31/2020   MCV 74.4 (L) 03/31/2020   PLT 266 03/31/2020   Lab Results  Component Value Date   NA 139 03/31/2020   K 4.1 03/31/2020   CO2 24 03/31/2020   GLUCOSE 90 03/31/2020   BUN 14 03/31/2020   CREATININE 0.71 03/31/2020   BILITOT 0.3 03/31/2020  ALKPHOS 61 12/06/2019   AST 22 03/31/2020   ALT 16 03/31/2020   PROT 8.0 03/31/2020   ALBUMIN 3.9 12/06/2019   CALCIUM 9.9 03/31/2020   ANIONGAP 14 09/21/2019   GFR 86.91 12/06/2019   Lab Results  Component Value Date   CHOL 202 (H) 03/31/2020   Lab Results  Component Value Date   HDL 72 03/31/2020   Lab Results  Component Value Date   LDLCALC 110 (H) 03/31/2020   Lab Results  Component Value Date   TRIG 92 03/31/2020   Lab Results  Component Value Date   CHOLHDL 2.8 03/31/2020   Lab Results  Component Value Date   HGBA1C 6.5 (H) 03/31/2020       Assessment & Plan:   Problem List Items  Addressed This Visit    Anemia   Relevant Orders   CBC (Completed)   Rheumatoid arthritis (HCC)    Continues to work full time and manages her pain with current pain meds.       Relevant Medications   HYDROcodone-acetaminophen (NORCO/VICODIN) 5-325 MG tablet   Other Relevant Orders   Lipid panel (Completed)   TSH (Completed)   Osteoporosis - Primary   Relevant Orders   VITAMIN D 25 Hydroxy (Vit-D Deficiency, Fractures) (Completed)   Chronic cough    Persistent but not exacerbated.       Relevant Medications   albuterol (VENTOLIN HFA) 108 (90 Base) MCG/ACT inhaler   Other Relevant Orders   CBC (Completed)   Chronic sinusitis   Relevant Medications   HYDROcodone-homatropine (HYCODAN) 5-1.5 MG/5ML syrup   amoxicillin-clavulanate (AUGMENTIN) 875-125 MG tablet   Tachycardia    RRR today      Asthma    Stable on current medications, no flare.       Relevant Medications   albuterol (VENTOLIN HFA) 108 (90 Base) MCG/ACT inhaler   Atypical chest pain   Relevant Orders   Lipid panel (Completed)   TSH (Completed)   Acute recurrent pansinusitis    Her headache improved with her steroid course but her head congestion and green rhinorrhea persist. She is started on Augmentin, Mucinex, probiotics and she will report concerns      Relevant Medications   HYDROcodone-homatropine (HYCODAN) 5-1.5 MG/5ML syrup   amoxicillin-clavulanate (AUGMENTIN) 875-125 MG tablet    Other Visit Diagnoses    Hyperglycemia       Relevant Orders   Comprehensive metabolic panel (Completed)   Lipid panel (Completed)   TSH (Completed)   Controlled type 2 diabetes mellitus without complication, without long-term current use of insulin (HCC)       Relevant Orders   Hemoglobin A1c (Completed)   Pain       Relevant Orders   DRUG MONITORING, PANEL 8 WITH CONFIRMATION, URINE      I have discontinued Byanka L. Ralph's methylPREDNISolone. I have also changed her HYDROcodone-homatropine and  famotidine. Additionally, I am having her start on amoxicillin-clavulanate. Lastly, I am having her maintain her gabapentin, pantoprazole, budesonide-formoterol, mupirocin ointment, levocetirizine, fluticasone, predniSONE, metoprolol succinate, montelukast, meloxicam, Hematinic/Folic Acid, HYDROcodone-acetaminophen, and albuterol.  Meds ordered this encounter  Medications  . HYDROcodone-homatropine (HYCODAN) 5-1.5 MG/5ML syrup    Sig: Take 5 mLs by mouth 2 (two) times daily as needed for cough.    Dispense:  120 mL    Refill:  0  . amoxicillin-clavulanate (AUGMENTIN) 875-125 MG tablet    Sig: Take 1 tablet by mouth 2 (two) times daily.    Dispense:  20 tablet  Refill:  0  . HYDROcodone-acetaminophen (NORCO/VICODIN) 5-325 MG tablet    Sig: Take 1 tablet by mouth every 6 (six) hours as needed for moderate pain or severe pain.    Dispense:  40 tablet    Refill:  0  . famotidine (PEPCID) 40 MG tablet    Sig: Take 1 tablet (40 mg total) by mouth daily.    Dispense:  90 tablet    Refill:  1  . albuterol (VENTOLIN HFA) 108 (90 Base) MCG/ACT inhaler    Sig: INHALE ONE TO TWO PUFFS INTO THE LUNGS EVERY 6 HOURS AS NEEDED FOR WHEEZING OR SHORTNESS OF BREATH    Dispense:  18 g    Refill:  5     Danise Edge, MD

## 2020-04-01 NOTE — Telephone Encounter (Signed)
-----   Message from [name], MD sent at 04/01/2020 12:05 PM EDT ----- Notify sugar is still the same. Mild diabetic number, minimize simple carbohydrates and processed foods. Cholesterol is pu above normal also. Recommend she start Atorvastatin 10 mg tabs, 1 tab po at bedtime twice a week. Disp #10 with 2 refills and recheck labs in 3 months.

## 2020-04-01 NOTE — Assessment & Plan Note (Signed)
Her headache improved with her steroid course but her head congestion and green rhinorrhea persist. She is started on Augmentin, Mucinex, probiotics and she will report concerns

## 2020-04-01 NOTE — Assessment & Plan Note (Signed)
RRR today 

## 2020-04-01 NOTE — Assessment & Plan Note (Signed)
Persistent but not exacerbated.

## 2020-04-02 ENCOUNTER — Telehealth: Payer: Self-pay

## 2020-04-02 LAB — DRUG MONITORING, PANEL 8 WITH CONFIRMATION, URINE
6 Acetylmorphine: NEGATIVE ng/mL (ref <10)
Alcohol Metabolites: NEGATIVE ng/mL
Amphetamines: NEGATIVE ng/mL (ref <500)
Benzodiazepines: NEGATIVE ng/mL (ref <100)
Buprenorphine, Urine: NEGATIVE ng/mL (ref <5)
Cocaine Metabolite: NEGATIVE ng/mL (ref <150)
Codeine: NEGATIVE ng/mL (ref <50)
Creatinine: 165.3 mg/dL
Hydrocodone: 79 ng/mL — ABNORMAL HIGH (ref <50)
Hydromorphone: 223 ng/mL — ABNORMAL HIGH (ref <50)
MDMA: NEGATIVE ng/mL (ref <500)
Marijuana Metabolite: NEGATIVE ng/mL (ref <20)
Morphine: NEGATIVE ng/mL (ref <50)
Norhydrocodone: 462 ng/mL — ABNORMAL HIGH (ref <50)
Opiates: POSITIVE ng/mL — AB (ref <100)
Oxidant: NEGATIVE ug/mL
Oxycodone: NEGATIVE ng/mL (ref <100)
pH: 6.5 (ref 4.5–9.0)

## 2020-04-02 LAB — DM TEMPLATE

## 2020-04-02 NOTE — Progress Notes (Signed)
Information given to pt and reminded to hold off on augmentin

## 2020-04-02 NOTE — Telephone Encounter (Signed)
Called pt back. Unable to leave vm message. Provider wants pt to stop Augmentin abx therapy for 4 days. To see if Hives diminish.  Email on my chart sent aswell.

## 2020-04-07 ENCOUNTER — Other Ambulatory Visit: Payer: Self-pay | Admitting: Family Medicine

## 2020-04-07 DIAGNOSIS — K219 Gastro-esophageal reflux disease without esophagitis: Secondary | ICD-10-CM

## 2020-04-16 ENCOUNTER — Telehealth: Payer: Self-pay

## 2020-04-16 NOTE — Telephone Encounter (Signed)
Pt contacted and given pcp instructions for possible reaction to medication

## 2020-04-16 NOTE — Telephone Encounter (Signed)
Called pt yesterday and today to f/u with reaction to concerns with Augmentin. No answer, unable to leave message letter mailed to home

## 2020-04-29 NOTE — Telephone Encounter (Addendum)
Patient called back in reference to Dr Abner Greenspan nurse reaching out to her last week, patient is just following up and provided a alternate number  Call back  [phone]

## 2020-04-29 NOTE — Telephone Encounter (Signed)
No answer/vm full

## 2020-05-06 ENCOUNTER — Other Ambulatory Visit: Payer: Self-pay | Admitting: Medical

## 2020-05-12 ENCOUNTER — Telehealth: Payer: Self-pay

## 2020-05-12 ENCOUNTER — Other Ambulatory Visit: Payer: Self-pay

## 2020-05-12 NOTE — Telephone Encounter (Signed)
OK D/C the Augmentin but document what her side effects are so we can decide if it is an allergy or an intolerance. Then send in Keflex 500 mg po tid x 10 days.

## 2020-05-12 NOTE — Telephone Encounter (Signed)
Pt called stated she had stopped the Augmentin d/t reaction and still needs the antibiotic for relief of mucous and congestion sx.

## 2020-05-13 NOTE — Telephone Encounter (Signed)
Side effects documented in dc'd med

## 2020-05-26 ENCOUNTER — Other Ambulatory Visit: Payer: Self-pay | Admitting: Family Medicine

## 2020-06-02 ENCOUNTER — Other Ambulatory Visit: Payer: Self-pay | Admitting: Family Medicine

## 2020-06-04 ENCOUNTER — Other Ambulatory Visit: Payer: Self-pay | Admitting: Family Medicine

## 2020-06-04 DIAGNOSIS — M069 Rheumatoid arthritis, unspecified: Secondary | ICD-10-CM

## 2020-06-04 MED ORDER — HYDROCODONE-ACETAMINOPHEN 5-325 MG PO TABS: 1.0000 | ORAL_TABLET | Freq: Four times a day (QID) | ORAL | 0 refills | Status: DC | PRN

## 2020-06-04 NOTE — Telephone Encounter (Signed)
Medication: HYDROcodone-acetaminophen (NORCO/VICODIN) 5-325 MG tablet    Has the patient contacted their pharmacy? No. (If no, request that the patient contact the pharmacy for the refill.) (If yes, when and what did the pharmacy advise?)  Preferred Pharmacy (with phone number or street name): [id number] - [place] ([place]), [place] - [address]  [address] [place] ([place]) [place] [address]  Phone:  [phone] Fax:  [phone]  DEA #:  --  Agent: Please be advised that RX refills may take up to 3 business days. We ask that you follow-up with your pharmacy.

## 2020-06-04 NOTE — Telephone Encounter (Signed)
Requesting: hydrocodone 5-325mg  Contract: 03/31/2020 UDS: 03/31/2020 Last Visit: 03/31/2020 Next Visit: 09/28/2020 Last Refill: 03/31/2020 #40 and 0RF  Please Advise

## 2020-06-29 ENCOUNTER — Encounter: Payer: Self-pay | Admitting: Podiatry

## 2020-06-29 ENCOUNTER — Other Ambulatory Visit: Payer: Self-pay

## 2020-06-29 ENCOUNTER — Ambulatory Visit (INDEPENDENT_AMBULATORY_CARE_PROVIDER_SITE_OTHER): Payer: BC Managed Care – PPO | Admitting: Podiatry

## 2020-06-29 DIAGNOSIS — M2042 Other hammer toe(s) (acquired), left foot: Secondary | ICD-10-CM

## 2020-06-29 DIAGNOSIS — B351 Tinea unguium: Secondary | ICD-10-CM

## 2020-06-29 DIAGNOSIS — M79676 Pain in unspecified toe(s): Secondary | ICD-10-CM

## 2020-06-29 DIAGNOSIS — M2011 Hallux valgus (acquired), right foot: Secondary | ICD-10-CM

## 2020-06-29 DIAGNOSIS — M069 Rheumatoid arthritis, unspecified: Secondary | ICD-10-CM | POA: Diagnosis not present

## 2020-06-29 DIAGNOSIS — M79675 Pain in left toe(s): Secondary | ICD-10-CM

## 2020-06-29 DIAGNOSIS — M2041 Other hammer toe(s) (acquired), right foot: Secondary | ICD-10-CM | POA: Diagnosis not present

## 2020-06-29 DIAGNOSIS — M79674 Pain in right toe(s): Secondary | ICD-10-CM

## 2020-06-29 DIAGNOSIS — L84 Corns and callosities: Secondary | ICD-10-CM

## 2020-06-29 DIAGNOSIS — M2012 Hallux valgus (acquired), left foot: Secondary | ICD-10-CM

## 2020-07-03 ENCOUNTER — Other Ambulatory Visit: Payer: Self-pay | Admitting: Family Medicine

## 2020-07-03 NOTE — Progress Notes (Signed)
Subjective:  Patient ID: Jamie Cox, female    DOB: 1961/04/26,  MRN: [id number]  Jamie Cox presents to clinic today for painful thick toenails that are difficult to trim. Pain interferes with ambulation. Aggravating factors include wearing enclosed shoe gear. Pain is relieved with periodic professional debridement..  60 y.o. female presents with the above complaint.    Review of Systems: Negative except as noted in the HPI. Past Medical History:  Diagnosis Date  . Allergy   . Asthma   . Atypical chest pain   . Chronic sinusitis    Followed by Dr. Jenne Pane  . Environmental allergies   . GERD (gastroesophageal reflux disease)   . 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 History of chicken pox   . Hypercalcemia 07/19/2015  . Osteoporosis, unspecified    steroid induced  . Personal history of other diseases of circulatory system   . Rheumatoid arthritis(714.0)   . Unspecified deficiency anemia    microcytic  . Urticaria    Past Surgical History:  Procedure Laterality Date  . NASAL SINUS SURGERY  06/2012    Current Outpatient Medications:  .  albuterol (VENTOLIN HFA) 108 (90 Base) MCG/ACT inhaler, INHALE ONE TO TWO PUFFS INTO THE LUNGS EVERY 6 HOURS AS NEEDED FOR WHEEZING OR SHORTNESS OF BREATH, Disp: 18 g, Rfl: 5 .  atorvastatin (LIPITOR) 10 MG tablet, Take 1 tablet by mouth once daily, Disp: 10 tablet, Rfl: 0 .  budesonide-formoterol (SYMBICORT) 80-4.5 MCG/ACT inhaler, INHALE 2 PUFFS INTO THE LUNGS TWO TIMES DAILY, Disp: 1 Inhaler, Rfl: 1 .  famotidine (PEPCID) 40 MG tablet, Take 1 tablet (40 mg total) by mouth daily., Disp: 90 tablet, Rfl: 1 .  fluticasone (FLONASE) 50 MCG/ACT nasal spray, Use 2 spray(s) in each nostril once daily, Disp: 16 g, Rfl: 5 .  gabapentin (NEURONTIN) 300 MG capsule, TAKE 1 CAPSULE BY MOUTH THREE TIMES DAILY, Disp: 90 capsule, Rfl: 0 .  HEMATINIC/FOLIC ACID 324-1 MG TABS, TAKE 1 TABLET BY MOUTH EVERY DAY, Disp: 90 tablet, Rfl: 1 .   HYDROcodone-acetaminophen (NORCO/VICODIN) 5-325 MG tablet, Take 1 tablet by mouth every 6 (six) hours as needed for moderate pain or severe pain., Disp: 40 tablet, Rfl: 0 .  HYDROcodone-homatropine (HYCODAN) 5-1.5 MG/5ML syrup, Take 5 mLs by mouth 2 (two) times daily as needed for cough., Disp: 120 mL, Rfl: 0 .  levocetirizine (XYZAL) 5 MG tablet, Take 1 tablet (5 mg total) by mouth every evening., Disp: 30 tablet, Rfl: 0 .  meloxicam (MOBIC) 7.5 MG tablet, TAKE 1 TO 2 TABLETS BY MOUTH ONCE DAILY AS NEEDED FOR PAIN, Disp: 30 tablet, Rfl: 7 .  metoprolol succinate (TOPROL-XL) 25 MG 24 hr tablet, TAKE 1 TABLET BY MOUTH ONCE DAILY ( PATIENT NEEDS FOLLOW UP WITH PCP ), Disp: 30 tablet, Rfl: 0 .  montelukast (SINGULAIR) 10 MG tablet, TAKE 1 TABLET BY MOUTH AT BEDTIME, Disp: 30 tablet, Rfl: 0 .  mupirocin ointment (BACTROBAN) 2 %, Apply 1 application topically 2 (two) times daily. (Patient not taking: Reported on 06/29/2020), Disp: 30 g, Rfl: 0 .  pantoprazole (PROTONIX) 40 MG tablet, Take 1 tablet by mouth once daily, Disp: 90 tablet, Rfl: 0 .  predniSONE (DELTASONE) 2.5 MG tablet, Take 1 tablet (2.5 mg total) by mouth daily with breakfast., Disp: 30 tablet, Rfl: 5 Allergies  Allergen Reactions  . Avelox [Moxifloxacin Hcl In Nacl] Diarrhea, Nausea And Vomiting and Other (See Comments)    Dizziness, Cold chills   . Bactrim [Sulfamethoxazole-Trimethoprim]  Hives  . Doxycycline     Abdominal discomfort, anorexia  . Sulfamethoxazole-Trimethoprim Hives  . Augmentin [Amoxicillin-Pot Clavulanate] Hives, Itching and Swelling   Social History   Occupational History  . Occupation: Data processing manager  Tobacco Use  . Smoking status: Never Smoker  . Smokeless tobacco: Never Used  Vaping Use  . Vaping Use: Never used  Substance and Sexual Activity  . Alcohol use: No    Alcohol/week: 0.0 standard drinks  . Drug use: No  . Sexual activity: Not Currently    Objective:   Constitutional Jamie Cox is  a pleasant 60 y.o. African American female, in NAD. AAO x 3.   Vascular Capillary refill time to digits immediate b/l. Palpable pedal pulses b/l LE. Pedal hair present. Lower extremity skin temperature gradient within normal limits. No cyanosis or clubbing noted.  Neurologic Normal speech. Oriented to person, place, and time. Protective sensation intact 5/5 intact bilaterally with 10g monofilament b/l. Vibratory sensation intact b/l. Proprioception intact bilaterally.  Dermatologic Pedal skin with normal turgor, texture and tone bilaterally. No open wounds bilaterally. No interdigital macerations bilaterally. Toenails 1-5 b/l elongated, discolored, dystrophic, thickened, crumbly with subungual debris and tenderness to dorsal palpation. Hyperkeratotic lesion(s) L 2nd toe, L 3rd toe, L 5th toe, R 3rd toe, R 4th toe, R 5th toe, submet head 2 left foot, submet head 2 right foot, submet head 3 left foot and submet head 3 right foot.  No erythema, no edema, no drainage, no fluctuance.  Orthopedic: Normal muscle strength 5/5 to all lower extremity muscle groups bilaterally. No pain crepitus or joint limitation noted with ROM b/l. Hallux valgus with bunion deformity noted b/l lower extremities. Hammertoes noted to the 2-5 bilaterally.   Radiographs: None Assessment:   1. Pain due to onychomycosis of toenail   2. Corns and callosities   3. Pain in toes of both feet   4. Acquired hammertoes of both feet   5. Hallux valgus, acquired, bilateral   6. Rheumatoid arthritis, involving unspecified site, unspecified whether rheumatoid factor present [facility])    Plan:  Patient was evaluated and treated and all questions answered.  Onychomycosis with pain -Nails palliatively debridement as below -Educated on self-care  Procedure: Nail Debridement Rationale: Pain Type of Debridement: manual, sharp debridement. Instrumentation: Nail nipper, rotary burr. Number of Nails: 10 -Examined patient. -Patient to continue  soft, supportive shoe gear daily. -Toenails 1-5 b/l were debrided in length and girth with sterile nail nippers and dremel without iatrogenic bleeding.  -Corn(s) L 2nd toe, L 3rd toe, L 5th toe, R 2nd toe, R 3rd toe and R 5th toe and callus(es) submet head 2 left foot, submet head 2 right foot, submet head 3 left foot and submet head 3 right foot were pared utilizing sterile scalpel blade without incident. Total number debrided =10. -Patient to report any pedal injuries to medical professional immediately. -Patient/POA to call should there be question/concern in the interim.  Return in about 3 months (around 09/27/2020).  Freddie Breech, DPM

## 2020-07-09 ENCOUNTER — Other Ambulatory Visit: Payer: Self-pay | Admitting: Family Medicine

## 2020-07-17 ENCOUNTER — Telehealth: Payer: Self-pay | Admitting: Family Medicine

## 2020-07-17 ENCOUNTER — Other Ambulatory Visit: Payer: Self-pay | Admitting: Family Medicine

## 2020-07-17 MED ORDER — CEFDINIR 300 MG PO CAPS: 300.0000 mg | ORAL_CAPSULE | Freq: Two times a day (BID) | ORAL | 0 refills | Status: AC

## 2020-07-17 NOTE — Telephone Encounter (Signed)
Pt is aware of instructions and would like a referral to see the an allergist.

## 2020-07-17 NOTE — Telephone Encounter (Signed)
I will send in Ciprofloxacin Please let her know to take it twice a day for 7 days

## 2020-07-17 NOTE — Telephone Encounter (Signed)
Patient called concerned about her nasal congestion, patient states she was prescribed an antibiotic and had a allergic reaction to medication. Patient only took medication for one day. Patient is still coughing and has congestion. Patient is requesting a new medication be sent into pharmacy

## 2020-07-17 NOTE — Telephone Encounter (Signed)
Pt aware of medication being sent in.

## 2020-07-17 NOTE — Telephone Encounter (Signed)
Notify her I sent in some Cefdinir but it is very distantly related to augmentin which she had a bad reaction to so if she has any trouble at all she should stop it and take some benadryl. The problem she is to the point now that she has reacted badly to nearly all the meds for respiratory infections. So if she is willing I think we should refer her to an allergist for evaluation. Let me know if she says yes

## 2020-07-19 ENCOUNTER — Other Ambulatory Visit: Payer: Self-pay | Admitting: Family Medicine

## 2020-07-19 DIAGNOSIS — T7840XD Allergy, unspecified, subsequent encounter: Secondary | ICD-10-CM

## 2020-07-19 DIAGNOSIS — R053 Chronic cough: Secondary | ICD-10-CM

## 2020-07-19 DIAGNOSIS — J328 Other chronic sinusitis: Secondary | ICD-10-CM

## 2020-07-19 DIAGNOSIS — J4540 Moderate persistent asthma, uncomplicated: Secondary | ICD-10-CM

## 2020-07-19 NOTE — Telephone Encounter (Signed)
done

## 2020-07-20 ENCOUNTER — Other Ambulatory Visit: Payer: Self-pay | Admitting: Family Medicine

## 2020-07-20 ENCOUNTER — Telehealth: Payer: Self-pay | Admitting: *Deleted

## 2020-07-20 DIAGNOSIS — M069 Rheumatoid arthritis, unspecified: Secondary | ICD-10-CM

## 2020-07-20 MED ORDER — HYDROCODONE-ACETAMINOPHEN 5-325 MG PO TABS: 1.0000 | ORAL_TABLET | Freq: Four times a day (QID) | ORAL | 0 refills | Status: DC | PRN

## 2020-07-20 MED ORDER — ATORVASTATIN CALCIUM 10 MG PO TABS: 10.0000 mg | ORAL_TABLET | Freq: Every day | ORAL | 1 refills | Status: DC

## 2020-07-20 NOTE — Telephone Encounter (Signed)
Requesting: hydrocodone Contract: 03/31/20 UDS: 03/31/20 Last Visit: 03/31/20 Next Visit: 09/28/20 Last Refill: 06/04/20  Please Advise

## 2020-07-20 NOTE — Telephone Encounter (Signed)
Patient notified that rx has been sent in. 

## 2020-07-20 NOTE — Telephone Encounter (Signed)
Patient also needed her atorvastatin fixed, it was only for 10 tabs.  Rx fixed.

## 2020-07-20 NOTE — Telephone Encounter (Signed)
Patient notified

## 2020-07-20 NOTE — Telephone Encounter (Signed)
done

## 2020-08-03 DIAGNOSIS — H6123 Impacted cerumen, bilateral: Secondary | ICD-10-CM | POA: Insufficient documentation

## 2020-08-04 ENCOUNTER — Other Ambulatory Visit: Payer: Self-pay | Admitting: Family Medicine

## 2020-08-13 ENCOUNTER — Encounter: Payer: Self-pay | Admitting: Allergy & Immunology

## 2020-08-13 ENCOUNTER — Ambulatory Visit: Payer: BC Managed Care – PPO | Admitting: Allergy & Immunology

## 2020-08-13 ENCOUNTER — Other Ambulatory Visit: Payer: Self-pay

## 2020-08-13 VITALS — BP 120/84 | HR 82 | Temp 97.8°F | Resp 12 | Ht 60.0 in | Wt 113.0 lb

## 2020-08-13 DIAGNOSIS — J479 Bronchiectasis, uncomplicated: Secondary | ICD-10-CM

## 2020-08-13 DIAGNOSIS — J310 Chronic rhinitis: Secondary | ICD-10-CM | POA: Diagnosis not present

## 2020-08-13 DIAGNOSIS — R059 Cough, unspecified: Secondary | ICD-10-CM

## 2020-08-13 DIAGNOSIS — B999 Unspecified infectious disease: Secondary | ICD-10-CM

## 2020-08-13 DIAGNOSIS — D849 Immunodeficiency, unspecified: Secondary | ICD-10-CM

## 2020-08-13 MED ORDER — XHANCE 93 MCG/ACT NA EXHU: 2.0000 | INHALANT_SUSPENSION | Freq: Two times a day (BID) | NASAL | 12 refills | Status: DC

## 2020-08-13 MED ORDER — BUDESONIDE-FORMOTEROL FUMARATE 80-4.5 MCG/ACT IN AERO: INHALATION_SPRAY | RESPIRATORY_TRACT | 1 refills | Status: DC

## 2020-08-13 NOTE — Progress Notes (Signed)
NEW PATIENT  Date of Service/Encounter:  08/13/20  Referring provider: [name], MD   Assessment:   Bronchiectasis - was followed by Dr. Sherene Sires  Possible reactive airway dysfunction syndrome  Recurrent sinusitis - getting immune labs today  Rheumatoid arthritis - on prednisone 2.5 mg daily   Immunosuppressed status  Plan/Recommendations:   1. Cough - Lung testing looks fairly good surprisingly.  - I want you to use the Symbicort two puffs twice daily with a spacer.  - Continue with albuterol as needed. - We will re-refer you to see Dr. Sherene Sires again.   2. Chronic rhinitis - Stop the Flonase and start Xhance two sprays per nostril twice daily. - Samples provided. - Continue with Singulair  daily. - Continue with Zyrtec  daily.  - We will do more sensitive intradermal testing at the next visit.  3. Recurrent infections - We will obtain some screening labs to evaluate your immune system.  - Labs to evaluate the quantitative [facility]) aspects of your immune system: IgG/IgA/IgM, CBC with differential - Labs to evaluate the qualitative (HOW WELL THEY WORK) aspects of your immune system: CH50, Pneumococcal titers, Tetanus titers, Diphtheria titers - We may consider immunizations with Pneumovax and Tdap to challenge your immune system, and then obtain repeat titers in 4-6 weeks.  - We will call ypou in 1-2 weeks with the results of the testing.  4. Return in about 4 weeks (around 09/10/2020) for INTRADERMAL TESTING (stop antihistamines 3 days before appointment).    Subjective:   LALENA SALAS is a 60 y.o. female presenting today for evaluation of  Chief Complaint  Patient presents with  . Cough    [name] has a history of the following: Patient Active Problem List   Diagnosis Date Noted  . COVID-19 09/19/2019  . Acute recurrent pansinusitis 05/29/2018  . Atypical chest pain 08/09/2017  . Onychomycosis 05/08/2017  . Cough variant asthma with  possible UACS component  05/05/2017  . Asthma 02/13/2017  . Allergy 08/03/2016  . Bronchiectasis without acute exacerbation (HCC) 06/29/2016  . 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 Cervical cancer screening 03/04/2016  . Hypercalcemia 07/19/2015  . History of chicken pox   . On prednisone therapy 09/25/2013  . Intercostal muscle pain 09/20/2013  . Preventative health care 08/06/2013  . GERD (gastroesophageal reflux disease) 08/01/2012  . Tachycardia 07/28/2012  . Health care maintenance 01/31/2012  . Chronic sinusitis 01/31/2012  . High risk medication use 08/17/2011  . 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 Chronic cough 09/06/2010  . Rheumatoid arthritis (HCC) 05/17/2007  . Anemia 06/23/2006  . Osteoporosis 06/23/2006    History obtained from: chart review and patient.  [name] was referred by [name], MD.     Tanairi is a 60 y.o. female presenting for an evaluation of chronic rhinitis. She has actually been seen by [place] in the past. Her first visit with [place] was in May 2018. At that time, she was non reactive to skin testing and environmental allergy testing via the blood confirmed that.  We continued with nasal saline rinses.  We added Dymista 2 sprays per nostril daily.  We also started her on a prednisone pack.  For her contact dermatitis, we recommended using gloves with her cleaning products.  We did discuss patch testing.  Her breathing was monitored by Dr. Sherene Sires, so we did not do anything with regards to that. She did follow-up a few days later for  testing since she has been on antihistamines.  Otherwise, we have not seen her since then.   In the interim, it seems that she has also failed to follow-up with Dr. Sherene Sires.  She did attempt to undergo a methacholine challenge but was coughing too much to continue the process.  Since the last visit, she has continued to have issues. She has been using the Allegra as well as the montelukast and the  Flonase. This is working somewhat. She is using these medications every day. She needs to work on sinus rinses. She has not been doing this at all. She reports that her sinus drainage has worsened over the course of the last few weeks. She has been on an antibiotic for a week or so now (ciprofloxacin).   She works at [employer] in [employer]. She does ok as long as she keeps her mask on.  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  She follows Aquilla Hacker (a PA who works with Dr. Jenne Pane). This is who started her on ciprofloxacin BID on 08/03/2020. She is following up with Dr. Jenne Pane. She did have surgery performed around 12 years ago by him. The chronic sinus infection is what bothers her the most. For months now, she has had green nasal drainage which is rather irritating for her.    Asthma/Respiratory Symptom History: She does wear a mask at work to prevent epxosure to the chemicals. She has not seen Dr. Sherene Sires in ages. She uses the albuterol as needed. She uses Symbicort for treatment of her "bronchiectasis". This is what she understands from Dr. Sherene Sires. She is not using the Symbicort regularly every day.  She is open to seeing Dr. Sherene Sires again.  She knows that she needs to take care of herself.  IMPRESSION (chest CT January 2018): 1. 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 2. Patulous esophagus. 3. Aortic atherosclerosis, in addition to three-vessel coronary artery disease. Please note that although the presence of coronary artery calcium documents the presence of coronary artery disease, the severity of this disease and any potential stenosis cannot be assessed on this non-gated CT examination. Assessment for potential risk factor modification, dietary therapy or pharmacologic therapy may be warranted, if clinically indicated.  IMPRESSION (sinus CT January 2018): Short  air-fluid levels within the maxillary sinuses bilaterally as well as sphenoid sinuses compatible with acute sinusitis Postoperative changes.  She has RA and is on prednisone 2.5 mg. This is prescribed by her PCP, Dr. Abner Greenspan. She does have a history of multiple infections. She has been diagnosed with walking pneumonia around 4 times. She has never required IV antibiotics.   Otherwise, there is no history of other atopic diseases, including food allergies, drug allergies, stinging insect allergies, eczema, urticaria or contact dermatitis. There is no significant infectious history. Vaccinations are up to date.    Past Medical History: Patient Active Problem List   Diagnosis Date Noted  . COVID-19 09/19/2019  . Acute recurrent pansinusitis 05/29/2018  . Atypical chest pain 08/09/2017  . Onychomycosis 05/08/2017  . Cough variant asthma with possible UACS component  05/05/2017  . Asthma 02/13/2017  . Allergy 08/03/2016  . Bronchiectasis without acute exacerbation (HCC) 06/29/2016  . 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 Cervical cancer screening 03/04/2016  . Hypercalcemia 07/19/2015  . History of chicken pox   . On prednisone therapy 09/25/2013  . Intercostal muscle pain 09/20/2013  .  Preventative health care 08/06/2013  . GERD (gastroesophageal reflux disease) 08/01/2012  . Tachycardia 07/28/2012  . Health care maintenance 01/31/2012  . Chronic sinusitis 01/31/2012  . High risk medication use 08/17/2011  . 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 Chronic cough 09/06/2010  . Rheumatoid arthritis (HCC) 05/17/2007  . Anemia 06/23/2006  . Osteoporosis 06/23/2006    Medication List:  Allergies as of 08/13/2020      Reactions   Avelox [moxifloxacin Hcl In Nacl] Diarrhea, Nausea And Vomiting, Other (See Comments)   Dizziness, Cold chills    Bactrim [sulfamethoxazole-trimethoprim] Hives   Doxycycline    Abdominal discomfort, anorexia   Sulfamethoxazole-trimethoprim  Hives   Augmentin [amoxicillin-pot Clavulanate] Hives, Itching, Swelling      Medication List       Accurate as of August 13, 2020 12:58 PM. If you have any questions, ask your nurse or doctor.        STOP taking these medications   HYDROcodone-acetaminophen 5-325 MG tablet Commonly known as: NORCO/VICODIN Stopped by: Alfonse Spruce, MD   HYDROcodone-homatropine 5-1.5 MG/5ML syrup Commonly known as: HYCODAN Stopped by: Alfonse Spruce, MD   mupirocin ointment 2 % Commonly known as: Bactroban Stopped by: Alfonse Spruce, MD     TAKE these medications   albuterol 108 (90 Base) MCG/ACT inhaler Commonly known as: VENTOLIN HFA INHALE ONE TO TWO PUFFS INTO THE LUNGS EVERY 6 HOURS AS NEEDED FOR WHEEZING OR SHORTNESS OF BREATH   atorvastatin 10 MG tablet Commonly known as: LIPITOR Take 1 tablet (10 mg total) by mouth daily.   budesonide-formoterol 80-4.5 MCG/ACT inhaler Commonly known as: Symbicort INHALE 2 PUFFS INTO THE LUNGS TWO TIMES DAILY   ciprofloxacin 500 MG tablet Commonly known as: CIPRO Take 500 mg by mouth 2 (two) times daily.   famotidine 40 MG tablet Commonly known as: PEPCID Take 1 tablet (40 mg total) by mouth daily.   fexofenadine 180 MG tablet Commonly known as: ALLEGRA Take 180 mg by mouth daily.   fluticasone 50 MCG/ACT nasal spray Commonly known as: FLONASE Use 2 spray(s) in each nostril once daily What changed: Another medication with the same name was added. Make sure you understand how and when to take each. Changed by: Alfonse Spruce, MD   Charmayne Sheer MCG/ACT Exhu Generic drug: Fluticasone Propionate Place 2 sprays into both nostrils in the morning and at bedtime. What changed: You were already taking a medication with the same name, and this prescription was added. Make sure you understand how and when to take each. Changed by: Alfonse Spruce, MD   gabapentin 300 MG capsule Commonly known as: NEURONTIN Take 1  capsule (300 mg total) by mouth 3 (three) times daily.   Hematinic/Folic Acid 324-1 MG Tabs Generic drug: Ferrous Fumarate-Folic Acid TAKE 1 TABLET BY MOUTH EVERY DAY   levocetirizine 5 MG tablet Commonly known as: XYZAL Take 1 tablet (5 mg total) by mouth every evening.   meloxicam 7.5 MG tablet Commonly known as: MOBIC TAKE 1 TO 2 TABLETS BY MOUTH ONCE DAILY AS NEEDED FOR PAIN   metoprolol succinate 25 MG 24 hr tablet Commonly known as: TOPROL-XL Take 1 tablet (25 mg total) by mouth daily.   montelukast 10 MG tablet Commonly known as: SINGULAIR TAKE 1 TABLET BY MOUTH AT BEDTIME   pantoprazole 40 MG tablet Commonly known as: PROTONIX Take 1 tablet by mouth once daily   predniSONE 2.5 MG tablet Commonly known as: DELTASONE TAKE 1 TABLET BY MOUTH  DAILY WITH BREAKFAST       Birth History: non-contributory  Developmental History: non-contributory  Past Surgical History: Past Surgical History:  Procedure Laterality Date  . NASAL SINUS SURGERY  06/2012  . NASAL SINUS SURGERY     2010     Family History: Family History  Problem Relation Age of Onset  . Liver cancer Maternal Grandmother   . Hypertension Maternal Grandmother   . Heart attack Maternal Grandmother   . Scleroderma Mother   . Heart disease Mother   . Kidney disease Mother   . Prostate cancer Father   . Diabetes Father   . Heart disease Father        CHF  . Arthritis Father        s/p hip replacement  . Cancer Father        lung cancer with mets, smoker  . Lung cancer Paternal Uncle   . Coronary artery disease Paternal Grandmother   . Hypertension Paternal Grandmother   . Liver cancer Paternal Grandmother   . Hyperlipidemia Son   . Alzheimer's disease Paternal Grandfather   . Other Sister        Cardiomegaly  . Arthritis Sister   . Heart disease Sister        rare  . Sleep apnea Brother   . Arthritis Daughter   . Lupus Cousin   . Arthritis Cousin   . Colon cancer Neg Hx   . Asthma  Neg Hx   . Allergic rhinitis Neg Hx   . Eczema Neg Hx   . Immunodeficiency Neg Hx   . Angioedema Neg Hx      Social History: Zaylynn lives at home with her husband, two older children, and two grandchildren. She lives in a rental house. There is mild throughout the home. She has electric heating and central cooling. There are no animals inside or outside of the home. She does not have dust mite coverings on her bedding. There is no tobacco exposure. She has never been a smoker at all.    Review of Systems  Constitutional: Negative.  Negative for chills, fever, malaise/fatigue and weight loss.  HENT: Positive for congestion. Negative for ear discharge and ear pain.        Positive for postnasal drip.  Eyes: Negative for pain, discharge and redness.  Respiratory: Positive for cough and shortness of breath. Negative for sputum production and wheezing.   Cardiovascular: Negative.  Negative for chest pain and palpitations.  Gastrointestinal: Negative for abdominal pain, constipation, diarrhea, heartburn, nausea and vomiting.  Skin: Negative.  Negative for itching and rash.  Neurological: Negative for dizziness and headaches.  Endo/Heme/Allergies: Negative for environmental allergies. Does not bruise/bleed easily.       Objective:   Blood pressure 120/84, pulse 82, temperature 97.8 F (36.6 C), temperature source Temporal, resp. rate 12, height 5' (1.524 m), weight 113 lb (51.3 kg). Body mass index is 22.07 kg/m.   Physical Exam:   Physical Exam Constitutional:      Appearance: She is well-developed.  HENT:     Head: Normocephalic and atraumatic.     Right Ear: Tympanic membrane, ear canal and external ear normal. No drainage, swelling or tenderness. Tympanic membrane is not injected, scarred, erythematous, retracted or bulging.     Left Ear: Tympanic membrane, ear canal and external ear normal. No drainage, swelling or tenderness. Tympanic membrane is not injected, scarred,  erythematous, retracted or bulging.     Nose: No nasal deformity, septal deviation,  mucosal edema or rhinorrhea.     Right Turbinates: Enlarged and swollen.     Left Turbinates: Enlarged and swollen.     Right Sinus: No maxillary sinus tenderness or frontal sinus tenderness.     Left Sinus: No maxillary sinus tenderness or frontal sinus tenderness.     Comments: Cobblestoning present in the posterior oropharynx.    Mouth/Throat:     Mouth: Mucous membranes are not pale and not dry.     Pharynx: Uvula midline.  Eyes:     General:        Right eye: No discharge.        Left eye: No discharge.     Conjunctiva/sclera: Conjunctivae normal.     Right eye: Right conjunctiva is not injected. No chemosis.    Left eye: Left conjunctiva is not injected. No chemosis.    Pupils: Pupils are equal, round, and reactive to light.  Cardiovascular:     Rate and Rhythm: Normal rate and regular rhythm.     Heart sounds: Normal heart sounds.  Pulmonary:     Effort: Pulmonary effort is normal. No tachypnea, accessory muscle usage or respiratory distress.     Breath sounds: Rhonchi present. No wheezing or rales.     Comments: No increased work of breathing.  There might be some decreased air movement at the bases.  Coarse upper airway sounds throughout. Chest:     Chest wall: No tenderness.  Abdominal:     Tenderness: There is no abdominal tenderness. There is no guarding or rebound.  Lymphadenopathy:     Head:     Right side of head: No submandibular, tonsillar or occipital adenopathy.     Left side of head: No submandibular, tonsillar or occipital adenopathy.     Cervical: No cervical adenopathy.  Skin:    General: Skin is warm.     Capillary Refill: Capillary refill takes less than 2 seconds.     Coloration: Skin is not pale.     Findings: No abrasion, erythema, petechiae or rash. Rash is not papular, urticarial or vesicular.  Neurological:     Mental Status: She is alert.      Diagnostic  studies:   Spirometry: results normal (FEV1: 1.24/73%, FVC: 1.69/78%, FEV1/FVC: 73%).    Spirometry consistent with normal pattern.   Allergy Studies:  Sent labs to evaluate her immune system, but we are going to do intradermal testing at the next visit since she was on antihistamines today           Malachi Bonds, MD [facility]

## 2020-08-13 NOTE — Patient Instructions (Addendum)
1. Cough - Lung testing looks fairly good surprisingly.  - I want you to use the Symbicort two puffs twice daily with a spacer.  - Continue with albuterol as needed. - We will re-refer you to see Dr. Sherene Sires again.   2. Chronic rhinitis - Stop the Flonase and start Xhance two sprays per nostril twice daily. - Samples provided. - Continue with Singulair 10mg  daily. - Continue with Zyrtec 10mg  daily.  - We will do more sensitive intradermal testing at the next visit.  3. Recurrent infections - We will obtain some screening labs to evaluate your immune system.  - Labs to evaluate the quantitative [facility]) aspects of your immune system: IgG/IgA/IgM, CBC with differential - Labs to evaluate the qualitative (HOW WELL THEY WORK) aspects of your immune system: CH50, Pneumococcal titers, Tetanus titers, Diphtheria titers - We may consider immunizations with Pneumovax and Tdap to challenge your immune system, and then obtain repeat titers in 4-6 weeks.  - We will call ypou in 1-2 weeks with the results of the testing.  4. Return in about 4 weeks (around 09/10/2020) for INTRADERMAL TESTING (stop antihistamines 3 days before appointment).    Please inform [facility] of any Emergency Department visits, hospitalizations, or changes in symptoms. Call 11/10/2020 before going to the ED for breathing or allergy symptoms since we might be able to fit you in for a sick visit. Feel free to contact us anytime with any questions, problems, or concerns.  It was a pleasure to meet you today!  Websites that have reliable patient information: 1. American Academy of Asthma, Allergy, and Immunology: www.aaaai.org 2. Food Allergy Research and Education (FARE): foodallergy.org 3. Mothers of Asthmatics: http://www.asthmacommunitynetwork.org 4. American College of Allergy, Asthma, and Immunology: www.acaai.org   COVID-19 Vaccine Information can be found at: [place]  For questions related to vaccine distribution or appointments, please email [email or web address] or call [phone].   We realize that you might be concerned about having an allergic reaction to the COVID19 vaccines. To help with that concern, WE ARE OFFERING THE COVID19 VACCINES IN OUR OFFICE! Ask the front desk for dates!     "Like" [email or web address] on Facebook and Instagram for our latest updates!      A healthy democracy works best when [phone] participate! Make sure you are registered to vote! If you have moved or changed any of your contact information, you will need to get this updated before voting!  In some cases, you MAY be able to register to vote online: [place]

## 2020-08-27 LAB — IGG, IGA, IGM
IgA/Immunoglobulin A, Serum: 796 mg/dL — ABNORMAL HIGH (ref 87–352)
IgG (Immunoglobin G), Serum: 1447 mg/dL (ref 586–1602)
IgM (Immunoglobulin M), Srm: 173 mg/dL (ref 26–217)

## 2020-08-27 LAB — CBC WITH DIFFERENTIAL
Basophils Absolute: 0 10*3/uL (ref 0.0–0.2)
Basos: 1 %
EOS (ABSOLUTE): 0.1 10*3/uL (ref 0.0–0.4)
Eos: 2 %
Hematocrit: 34.4 % (ref 34.0–46.6)
Hemoglobin: 10.4 g/dL — ABNORMAL LOW (ref 11.1–15.9)
Immature Grans (Abs): 0 10*3/uL (ref 0.0–0.1)
Immature Granulocytes: 0 %
Lymphocytes Absolute: 1.2 10*3/uL (ref 0.7–3.1)
Lymphs: 23 %
MCH: 22.9 pg — ABNORMAL LOW (ref 26.6–33.0)
MCHC: 30.2 g/dL — ABNORMAL LOW (ref 31.5–35.7)
MCV: 76 fL — ABNORMAL LOW (ref 79–97)
Monocytes Absolute: 0.6 10*3/uL (ref 0.1–0.9)
Monocytes: 10 %
Neutrophils Absolute: 3.5 10*3/uL (ref 1.4–7.0)
Neutrophils: 64 %
RBC: 4.55 x10E6/uL (ref 3.77–5.28)
RDW: 16.2 % — ABNORMAL HIGH (ref 11.7–15.4)
WBC: 5.5 10*3/uL (ref 3.4–10.8)

## 2020-08-27 LAB — STREP PNEUMONIAE 23 SEROTYPES IGG
Pneumo Ab Type 1*: 1 ug/mL — ABNORMAL LOW (ref >1.3)
Pneumo Ab Type 12 (12F)*: 0.1 ug/mL — ABNORMAL LOW (ref >1.3)
Pneumo Ab Type 14*: 2.3 ug/mL (ref >1.3)
Pneumo Ab Type 17 (17F)*: 1.2 ug/mL — ABNORMAL LOW (ref >1.3)
Pneumo Ab Type 19 (19F)*: 1.9 ug/mL (ref >1.3)
Pneumo Ab Type 2*: 0.4 ug/mL — ABNORMAL LOW (ref >1.3)
Pneumo Ab Type 20*: 0.9 ug/mL — ABNORMAL LOW (ref >1.3)
Pneumo Ab Type 22 (22F)*: 0.2 ug/mL — ABNORMAL LOW (ref >1.3)
Pneumo Ab Type 23 (23F)*: 0.3 ug/mL — ABNORMAL LOW (ref >1.3)
Pneumo Ab Type 26 (6B)*: 3.1 ug/mL (ref >1.3)
Pneumo Ab Type 3*: 2.3 ug/mL (ref >1.3)
Pneumo Ab Type 34 (10A)*: 1.4 ug/mL (ref >1.3)
Pneumo Ab Type 4*: 0.2 ug/mL — ABNORMAL LOW (ref >1.3)
Pneumo Ab Type 43 (11A)*: 1.7 ug/mL (ref >1.3)
Pneumo Ab Type 5*: 0.6 ug/mL — ABNORMAL LOW (ref >1.3)
Pneumo Ab Type 51 (7F)*: 0.1 ug/mL — ABNORMAL LOW (ref >1.3)
Pneumo Ab Type 54 (15B)*: 1.3 ug/mL — ABNORMAL LOW (ref >1.3)
Pneumo Ab Type 56 (18C)*: 0.6 ug/mL — ABNORMAL LOW (ref >1.3)
Pneumo Ab Type 57 (19A)*: 2.2 ug/mL (ref >1.3)
Pneumo Ab Type 68 (9V)*: 0.4 ug/mL — ABNORMAL LOW (ref >1.3)
Pneumo Ab Type 70 (33F)*: 4 ug/mL (ref >1.3)
Pneumo Ab Type 8*: 0.4 ug/mL — ABNORMAL LOW (ref >1.3)
Pneumo Ab Type 9 (9N)*: 0.4 ug/mL — ABNORMAL LOW (ref >1.3)

## 2020-08-27 LAB — DIPHTHERIA / TETANUS ANTIBODY PANEL
Diphtheria Ab: 0.15 IU/mL (ref <0.10)
Tetanus Ab, IgG: 2 IU/mL (ref <0.10)

## 2020-08-27 LAB — COMPLEMENT, TOTAL: Compl, Total (CH50): 46 U/mL (ref >41)

## 2020-09-14 ENCOUNTER — Ambulatory Visit: Payer: BC Managed Care – PPO | Admitting: Allergy & Immunology

## 2020-09-17 ENCOUNTER — Ambulatory Visit: Payer: BC Managed Care – PPO | Admitting: Allergy & Immunology

## 2020-09-22 NOTE — Progress Notes (Signed)
Follow Up Note  RE: Jamie Cox MRN: [id number] DOB: [birth year] Date of Office Visit: 09/23/2020  Referring provider: [name], MD Primary care provider: [name], MD  Chief Complaint: Follow-up (Allergy testing), Cough, and Nasal Congestion  History of Present Illness: I had the pleasure of seeing Jamie Cox for a follow up visit at the [facility] on 09/23/2020. She is a 60 y.o. female, who is being followed for coughing, rhinitis and recurrent infections. Her previous allergy office visit was on 08/13/2020 with Dr. Dellis Anes. Today is a regular follow up visit.  1. Cough Coughing is the same and occurring daily still. The mucous is currently green in color now. Patient usually takes Tylenol for her RA daily so usually she doesn't mount a fever. Denies fevers/chills. No sick contacts. No recent CXR.   Patient takes prednisone daily for RA and finished antibiotics for sinus infection 2-3 weeks ago.  Not using Symbicort daily and using it about a few times per week with unknown benefit.  Using albuterol less than once per week.  Patient has not seen Dr. Sherene Sires yet.  Takes famotidine 40mg  daily.  Does not like to take Protonix as it makes her stomach hurt - takes famotidine instead.   2. Chronic rhinitis Using Xhance 1 spray per nostril once a day - has not used as prescribed.  Following with ENT due to frequent sinusitis.  Takes allegra and Singulair daily.   3. Recurrent infections Patient did not get pneumonia vaccine yet.   Assessment and Plan: Jamie Cox is a 60 y.o. female with: Cough Persistent daily coughing since the last visit.  Now has some discolored mucus but no fevers or chills.  No recent chest x-ray.  Patient is on a low-dose prednisone for rheumatoid arthritis.  She did not take Symbicort on a daily basis as prescribed at the last visit.  2019 CXR showed bronchiectasis.  Today's spirometry shows some restriction with 4%  improvement in FEV1 post bronchodilator treatment.  Clinically feeling improved.  Get CXR.  Follow up with pulmonology as scheduled in May. . Daily controller medication(s): START Symbicort June 2 puffs twice a day with spacer and rinse mouth afterwards.  Continue with Singulair 10mg  daily. . Prior to physical activity: May use albuterol rescue inhaler 2 puffs 5 to 15 minutes prior to strenuous physical activities. Rescue medications: May use albuterol rescue inhaler 2 puffs or nebulizer every 4 to 6 hours as needed for shortness of breath, chest tightness, coughing, and wheezing. Monitor frequency of use.  . May use albuterol rescue inhaler 2 puffs every 4 to 6 hours as needed for shortness of breath, chest tightness, coughing, and wheezing. May use albuterol rescue inhaler 2 puffs 5 to 15 minutes prior to strenuous physical activities. Monitor frequency of use.  . Question if she would benefit from flutter valve/vest therapy for the chronic cough/mucous production given past chest imagining results - will see what pulmonology recommends first.   Chronic rhinitis Past history - 2018 bloodwork negative to environmental allergy panel.  Interim history - Frequent sinus infections and follows with ENT. Not using Xhance as prescribed.  Today's skin prick testing and intradermal testing was negative to indoor/outdoor allergens.  START Xhance two sprays per nostril twice daily.  Nasal saline spray (i.e., Simply Saline) or nasal saline lavage (i.e., NeilMed) is recommended as needed and prior to medicated nasal sprays.   Recurrent infections Did not get pneumovax yet.  Keep track of  infections/antibiotics use.  Get pneumonia shot at PCP's office or pharmacy. Get repeat titers 4 weeks after vaccine.  Return in about 2 months (around 11/23/2020).  No orders of the defined types were placed in this encounter.   Lab Orders     Strep pneumoniae 23 Serotypes IgG  Diagnostics: Spirometry:   Tracings reviewed. Her effort: Good reproducible efforts. FVC: 1.64L FEV1: 1.12L, 59% predicted FEV1/FVC ratio: 68% Interpretation: Spirometry consistent with possible restrictive disease with 4% improvement in FEV1 post bronchodilator treatment. Clinically feeling improved.   Please see scanned spirometry results for details.  Skin Testing: Environmental allergy panel. Negative test to: indoor/outdoor allergens.  Results discussed with patient/family.  Airborne Adult Perc - 09/23/20 1202    Time Antigen Placed 1202    Allergen Manufacturer Waynette Buttery    Location Back    Number of Test 59    1. Control-Buffer 50% Glycerol Negative    2. Control-Histamine 1 mg/ml 2+    3. Albumin saline Negative    4. Bahia Negative    5. French Southern Territories Negative    6. Johnson Negative    7. Kentucky Blue Negative    8. Meadow Fescue Negative    9. Perennial Rye Negative    10. Sweet Vernal Negative    11. Timothy Negative    12. Cocklebur Negative    13. Burweed Marshelder Negative    14. Ragweed, short Negative    15. Ragweed, Giant Negative    16. Plantain,  English Negative    17. Lamb's Quarters Negative    18. Sheep Sorrell Negative    19. Rough Pigweed Negative    20. Marsh Elder, Rough Negative    21. Mugwort, Common Negative    22. Ash mix Negative    23. Birch mix Negative    24. Beech American Negative    25. Box, Elder Negative    26. Cedar, red Negative    27. Cottonwood, Guinea-Bissau Negative    28. Elm mix Negative    29. Hickory Negative    30. Maple mix Negative    31. Oak, Guinea-[place]au mix Negative    32. Pecan Pollen Negative    33. Pine mix Negative    34. Sycamore Eastern Negative    35. Walnut, Black Pollen Negative    36. Alternaria alternata Negative    37. Cladosporium Herbarum Negative    38. Aspergillus mix Negative    39. Penicillium mix Negative    40. Bipolaris sorokiniana (Helminthosporium) Negative    41. Drechslera spicifera (Curvularia) Negative    42. Mucor  plumbeus Negative    43. Fusarium moniliforme Negative    44. Aureobasidium pullulans (pullulara) Negative    45. Rhizopus oryzae Negative    46. Botrytis cinera Negative    47. Epicoccum nigrum Negative    48. Phoma betae Negative    49. Candida Albicans Negative    50. Trichophyton mentagrophytes Negative    51. Mite, D Farinae  5,000 AU/ml Negative    52. Mite, D Pteronyssinus  5,000 AU/ml Negative    53. Cat Hair 10,000 BAU/ml Negative    54.  Dog Epithelia Negative    55. Mixed Feathers Negative    56. Horse Epithelia Negative    57. Cockroach, German Negative    58. Mouse Negative    59. Tobacco Leaf Negative          Intradermal - 09/23/20 1233    Time Antigen Placed 1225    Allergen Manufacturer Waynette Buttery  Location Arm    Number of Test 15    Control Negative    French Southern Territories Negative    Johnson Negative    7 Grass Negative    Ragweed mix Negative    Weed mix Negative    Tree mix Negative    Mold 1 Negative    Mold 2 Negative    Mold 3 Negative    Mold 4 Negative    Cat Negative    Dog Negative    Cockroach Negative    Mite mix Negative           Medication List:  Current Outpatient Medications  Medication Sig Dispense Refill  . albuterol (VENTOLIN HFA) 108 (90 Base) MCG/ACT inhaler INHALE ONE TO TWO PUFFS INTO THE LUNGS EVERY 6 HOURS AS NEEDED FOR WHEEZING OR SHORTNESS OF BREATH 18 g 5  . atorvastatin (LIPITOR) 10 MG tablet Take 1 tablet (10 mg total) by mouth daily. 90 tablet 1  . budesonide-formoterol (SYMBICORT) 80-4.5 MCG/ACT inhaler INHALE 2 PUFFS INTO THE LUNGS TWO TIMES DAILY 1 each 1  . famotidine (PEPCID) 40 MG tablet Take 1 tablet (40 mg total) by mouth daily. 90 tablet 1  . fexofenadine (ALLEGRA) 180 MG tablet Take 180 mg by mouth daily.    . Fluticasone Propionate (XHANCE) 93 MCG/ACT EXHU Place 2 sprays into both nostrils in the morning and at bedtime. 16 mL 12  . gabapentin (NEURONTIN) 300 MG capsule Take 1 capsule (300 mg total) by mouth 3 (three)  times daily. 270 capsule 1  . HEMATINIC/FOLIC ACID 324-1 MG TABS TAKE 1 TABLET BY MOUTH EVERY DAY 90 tablet 1  . influenza vac split quadrivalent PF (FLUARIX) 0.5 ML injection TAKE AS DIRECTED .5 mL 0  . meloxicam (MOBIC) 7.5 MG tablet TAKE 1 TO 2 TABLETS BY MOUTH ONCE DAILY AS NEEDED FOR PAIN 30 tablet 7  . metoprolol succinate (TOPROL-XL) 25 MG 24 hr tablet Take 1 tablet (25 mg total) by mouth daily. 90 tablet 1  . montelukast (SINGULAIR) 10 MG tablet TAKE 1 TABLET BY MOUTH AT BEDTIME 30 tablet 3  . predniSONE (DELTASONE) 2.5 MG tablet TAKE 1 TABLET BY MOUTH  DAILY WITH BREAKFAST 30 tablet 1  . ciprofloxacin (CIPRO) 500 MG tablet Take 500 mg by mouth 2 (two) times daily. (Patient not taking: Reported on 09/23/2020)    . fluticasone (FLONASE) 50 MCG/ACT nasal spray Use 2 spray(s) in each nostril once daily (Patient not taking: Reported on 09/23/2020) 16 g 5  . levocetirizine (XYZAL) 5 MG tablet Take 1 tablet (5 mg total) by mouth every evening. (Patient not taking: No sig reported) 30 tablet 0  . pantoprazole (PROTONIX) 40 MG tablet Take 1 tablet by mouth once daily (Patient not taking: Reported on 09/23/2020) 90 tablet 0   No current facility-administered medications for this visit.   Allergies: Allergies  Allergen Reactions  . Avelox [Moxifloxacin Hcl In Nacl] Diarrhea, Nausea And Vomiting and Other (See Comments)    Dizziness, Cold chills   . Bactrim [Sulfamethoxazole-Trimethoprim] Hives  . Doxycycline     Abdominal discomfort, anorexia  . Sulfamethoxazole-Trimethoprim Hives  . Augmentin [Amoxicillin-Pot Clavulanate] Hives, Itching and Swelling   I reviewed her past medical history, social history, family history, and environmental history and no significant changes have been reported from her previous visit.  Review of Systems  Constitutional: Negative for appetite change, chills, fever and unexpected weight change.  HENT: Positive for congestion. Negative for rhinorrhea.   Eyes:  Negative for itching.  Respiratory: Positive for cough. Negative for chest tightness, shortness of breath and wheezing.   Gastrointestinal: Negative for abdominal pain.  Skin: Negative for rash.  Allergic/Immunologic: Negative for environmental allergies.  Neurological: Negative for headaches.   Objective: BP 134/78 (Patient Position: Sitting)   Pulse 97   Temp 98.3 F (36.8 C) (Temporal)   Resp 20   SpO2 98%  There is no height or weight on file to calculate BMI. Physical Exam Vitals and nursing note reviewed.  Constitutional:      Appearance: Normal appearance. She is well-developed.  HENT:     Head: Normocephalic and atraumatic.     Right Ear: External ear normal.     Left Ear: External ear normal.     Nose: Nose normal.     Mouth/Throat:     Mouth: Mucous membranes are moist.     Pharynx: Oropharynx is clear.  Eyes:     Conjunctiva/sclera: Conjunctivae normal.  Cardiovascular:     Rate and Rhythm: Normal rate and regular rhythm.     Heart sounds: Normal heart sounds. No murmur heard.   Pulmonary:     Effort: Pulmonary effort is normal.     Breath sounds: Normal breath sounds. No wheezing, rhonchi or rales.  Musculoskeletal:     Cervical back: Neck supple.  Skin:    General: Skin is warm.     Findings: No rash.  Neurological:     Mental Status: She is alert and oriented to person, place, and time.  Psychiatric:        Behavior: Behavior normal.    Previous notes and tests were reviewed. The plan was reviewed with the patient/family, and all questions/concerned were addressed.  It was my pleasure to see Jamie Cox today and participate in her care. Please feel free to contact me with any questions or concerns.  Sincerely,  Wyline Mood, DO Allergy & Immunology  [facility] office: [phone] [facility] office: [phone]  45 minutes spent face-to-face with more than 50% of the time spent discussing respiratory  issues, nonallergic rhinitis, frequent sinusitis.

## 2020-09-23 ENCOUNTER — Ambulatory Visit (INDEPENDENT_AMBULATORY_CARE_PROVIDER_SITE_OTHER): Payer: BC Managed Care – PPO | Admitting: Allergy

## 2020-09-23 ENCOUNTER — Other Ambulatory Visit: Payer: Self-pay

## 2020-09-23 ENCOUNTER — Encounter: Payer: Self-pay | Admitting: Allergy

## 2020-09-23 VITALS — BP 134/78 | HR 97 | Temp 98.3°F | Resp 20

## 2020-09-23 DIAGNOSIS — B999 Unspecified infectious disease: Secondary | ICD-10-CM | POA: Diagnosis not present

## 2020-09-23 DIAGNOSIS — J310 Chronic rhinitis: Secondary | ICD-10-CM | POA: Diagnosis not present

## 2020-09-23 DIAGNOSIS — R059 Cough, unspecified: Secondary | ICD-10-CM | POA: Diagnosis not present

## 2020-09-23 NOTE — Assessment & Plan Note (Signed)
Past history - 2018 bloodwork negative to environmental allergy panel.  Interim history - Frequent sinus infections and follows with ENT. Not using Xhance as prescribed.  Today's skin prick testing and intradermal testing was negative to indoor/outdoor allergens.  START Xhance two sprays per nostril twice daily.  Nasal saline spray (i.e., Simply Saline) or nasal saline lavage (i.e., NeilMed) is recommended as needed and prior to medicated nasal sprays.

## 2020-09-23 NOTE — Patient Instructions (Addendum)
Today's testing was negative to indoor/outdoor allergens.  1. Cough . GET CXR. . Follow up with pulmonology again - May 4th  10/07/2020 11:30 AM Charlott Holler, MD [facility]   . Daily controller medication(s): START Symbicort 2 puffs twice a day with spacer and rinse mouth afterwards.  Continue with Singulair 10mg  daily. . Prior to physical activity: May use albuterol rescue inhaler 2 puffs 5 to 15 minutes prior to strenuous physical activities. Rescue medications: May use albuterol rescue inhaler 2 puffs or nebulizer every 4 to 6 hours as needed for shortness of breath, chest tightness, coughing, and wheezing. Monitor frequency of use.  . May use albuterol rescue inhaler 2 puffs every 4 to 6 hours as needed for shortness of breath, chest tightness, coughing, and wheezing. May use albuterol rescue inhaler 2 puffs 5 to 15 minutes prior to strenuous physical activities. Monitor frequency of use.  . Asthma control goals:  o Full participation in all desired activities (may need albuterol before activity) o Albuterol use two times or less a week on average (not counting use with activity) o Cough interfering with sleep two times or less a month o Oral steroids no more than once a year o No hospitalizations  2. Chronic rhinitis  START Xhance two sprays per nostril twice daily.  Nasal saline spray (i.e., Simply Saline) or nasal saline lavage (i.e., NeilMed) is recommended as needed and prior to medicated nasal sprays.  3. Recurrent infections  Keep track of infections/antibiotics use.  Get pneumonia shot at PCP's office or pharmacy. Get bloodwork to check how the vaccine worked 4 weeks after the vaccine.  Follow up in 2 months or sooner if needed in our [place] office.

## 2020-09-23 NOTE — Assessment & Plan Note (Addendum)
Did not get pneumovax yet.  Keep track of infections/antibiotics use.  Get pneumonia shot at PCP's office or pharmacy. Get repeat titers 4 weeks after vaccine.

## 2020-09-23 NOTE — Assessment & Plan Note (Signed)
Persistent daily coughing since the last visit.  Now has some discolored mucus but no fevers or chills.  No recent chest x-ray.  Patient is on a low-dose prednisone for rheumatoid arthritis.  She did not take Symbicort on a daily basis as prescribed at the last visit.  2019 CXR showed bronchiectasis.  Today's spirometry shows some restriction with 4% improvement in FEV1 post bronchodilator treatment.  Clinically feeling improved.  Get CXR.  Follow up with pulmonology as scheduled in May. . Daily controller medication(s): START Symbicort 2 puffs twice a day with spacer and rinse mouth afterwards.  Continue with Singulair 10mg  daily. . Prior to physical activity: May use albuterol rescue inhaler 2 puffs 5 to 15 minutes prior to strenuous physical activities. Rescue medications: May use albuterol rescue inhaler 2 puffs or nebulizer every 4 to 6 hours as needed for shortness of breath, chest tightness, coughing, and wheezing. Monitor frequency of use.  . May use albuterol rescue inhaler 2 puffs every 4 to 6 hours as needed for shortness of breath, chest tightness, coughing, and wheezing. May use albuterol rescue inhaler 2 puffs 5 to 15 minutes prior to strenuous physical activities. Monitor frequency of use.  . Question if she would benefit from flutter valve/vest therapy for the chronic cough/mucous production given past chest imagining results - will see what pulmonology recommends first.

## 2020-09-28 ENCOUNTER — Ambulatory Visit (INDEPENDENT_AMBULATORY_CARE_PROVIDER_SITE_OTHER): Payer: BC Managed Care – PPO | Admitting: Family Medicine

## 2020-09-28 ENCOUNTER — Encounter: Payer: Self-pay | Admitting: Family Medicine

## 2020-09-28 ENCOUNTER — Other Ambulatory Visit: Payer: Self-pay

## 2020-09-28 VITALS — BP 124/66 | HR 63 | Temp 98.0°F | Resp 16 | Ht 60.0 in | Wt 114.6 lb

## 2020-09-28 DIAGNOSIS — M810 Age-related osteoporosis without current pathological fracture: Secondary | ICD-10-CM

## 2020-09-28 DIAGNOSIS — Z1239 Encounter for other screening for malignant neoplasm of breast: Secondary | ICD-10-CM | POA: Diagnosis not present

## 2020-09-28 DIAGNOSIS — E119 Type 2 diabetes mellitus without complications: Secondary | ICD-10-CM

## 2020-09-28 DIAGNOSIS — R000 Tachycardia, unspecified: Secondary | ICD-10-CM

## 2020-09-28 DIAGNOSIS — Z0000 Encounter for general adult medical examination without abnormal findings: Secondary | ICD-10-CM

## 2020-09-28 DIAGNOSIS — D649 Anemia, unspecified: Secondary | ICD-10-CM

## 2020-09-28 DIAGNOSIS — M069 Rheumatoid arthritis, unspecified: Secondary | ICD-10-CM | POA: Diagnosis not present

## 2020-09-28 DIAGNOSIS — Z23 Encounter for immunization: Secondary | ICD-10-CM | POA: Diagnosis not present

## 2020-09-28 DIAGNOSIS — Z780 Asymptomatic menopausal state: Secondary | ICD-10-CM

## 2020-09-28 DIAGNOSIS — E2839 Other primary ovarian failure: Secondary | ICD-10-CM

## 2020-09-28 DIAGNOSIS — U071 COVID-19: Secondary | ICD-10-CM

## 2020-09-28 MED ORDER — HYDROCODONE-ACETAMINOPHEN 5-325 MG PO TABS: 1.0000 | ORAL_TABLET | Freq: Four times a day (QID) | ORAL | 0 refills | Status: DC | PRN

## 2020-09-28 NOTE — Progress Notes (Signed)
Subjective:    Patient ID: Jamie Cox, female    DOB: 20-Nov-1960, 60 y.o.   MRN: [id number]  Chief Complaint  Patient presents with  . Annual Exam    Pt states that her legs bother her.    HPI Patient is in today for annual preventative exam and follow up on chronic medical concerns. No recent febrile illness or hospitalizations. No polyuria or polydipsia. Is trying to eat a heart healthy diet and stay active. She is noting increased fatigue and a sense of her legs being weaker. No falls or trauma. She denies any flares in her allergies. Denies CP/palp/SOB/HA/congestion/fevers/GI or GU c/o. Taking meds as prescribed  Past Medical History:  Diagnosis Date  . Allergy   . Asthma   . Atypical chest pain   . Chronic sinusitis    Followed by Dr. Jenne Pane  . Environmental allergies   . GERD (gastroesophageal reflux disease)   . 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 History of chicken pox   . Hypercalcemia 07/19/2015  . Osteoporosis, unspecified    steroid induced  . Personal history of other diseases of circulatory system   . Rheumatoid arthritis(714.0)   . Unspecified deficiency anemia    microcytic  . Urticaria     Past Surgical History:  Procedure Laterality Date  . NASAL SINUS SURGERY  06/2012  . NASAL SINUS SURGERY     2010    Family History  Problem Relation Age of Onset  . Liver cancer Maternal Grandmother   . Hypertension Maternal Grandmother   . Heart attack Maternal Grandmother   . Scleroderma Mother   . Heart disease Mother   . Kidney disease Mother   . Other Mother   . Prostate cancer Father   . Diabetes Father   . Heart disease Father        CHF  . Arthritis Father        s/p hip replacement  . Cancer Father        lung cancer with mets, smoker  . Lung cancer Paternal Uncle   . Coronary artery disease Paternal Grandmother   . Hypertension Paternal Grandmother   . Liver cancer Paternal Grandmother   . Hyperlipidemia Son   . Alzheimer's  disease Paternal Grandfather   . Other Sister        Cardiomegaly  . Arthritis Sister   . Heart disease Sister        rare  . Sleep apnea Brother   . Heart disease Brother   . Arthritis Daughter   . Lupus Cousin   . Arthritis Cousin   . Colon cancer Neg Hx   . Asthma Neg Hx   . Allergic rhinitis Neg Hx   . Eczema Neg Hx   . Immunodeficiency Neg Hx   . Angioedema Neg Hx     Social History   Socioeconomic History  . Marital status: Married    Spouse name: Not on file  . Number of children: 2  . Years of education: Not on file  . Highest education level: Not on file  Occupational History  . Occupation: Data processing manager  Tobacco Use  . Smoking status: Never Smoker  . Smokeless tobacco: Never Used  Vaping Use  . Vaping Use: Never used  Substance and Sexual Activity  . Alcohol use: No    Alcohol/week: 0.0 standard drinks  . Drug use: No  . Sexual activity: Not Currently  Other Topics Concern  . Not on file  Social History Narrative  . Not on file   Social Determinants of Health   Financial Resource Strain: Not on file  Food Insecurity: Not on file  Transportation Needs: Not on file  Physical Activity: Not on file  Stress: Not on file  Social Connections: Not on file  Intimate Partner Violence: Not on file    Outpatient Medications Prior to Visit  Medication Sig Dispense Refill  . albuterol (VENTOLIN HFA) 108 (90 Base) MCG/ACT inhaler INHALE ONE TO TWO PUFFS INTO THE LUNGS EVERY 6 HOURS AS NEEDED FOR WHEEZING OR SHORTNESS OF BREATH 18 g 5  . atorvastatin (LIPITOR) 10 MG tablet Take 1 tablet (10 mg total) by mouth daily. 90 tablet 1  . budesonide-formoterol (SYMBICORT) 80-4.5 MCG/ACT inhaler INHALE 2 PUFFS INTO THE LUNGS TWO TIMES DAILY 1 each 1  . famotidine (PEPCID) 40 MG tablet Take 1 tablet (40 mg total) by mouth daily. 90 tablet 1  . fexofenadine (ALLEGRA) 180 MG tablet Take 180 mg by mouth daily.    . Fluticasone Propionate (XHANCE) 93 MCG/ACT EXHU Place 2  sprays into both nostrils in the morning and at bedtime. 16 mL 12  . gabapentin (NEURONTIN) 300 MG capsule Take 1 capsule (300 mg total) by mouth 3 (three) times daily. 270 capsule 1  . HEMATINIC/FOLIC ACID 324-1 MG TABS TAKE 1 TABLET BY MOUTH EVERY DAY 90 tablet 1  . levocetirizine (XYZAL) 5 MG tablet Take 1 tablet (5 mg total) by mouth every evening. 30 tablet 0  . meloxicam (MOBIC) 7.5 MG tablet TAKE 1 TO 2 TABLETS BY MOUTH ONCE DAILY AS NEEDED FOR PAIN 30 tablet 7  . metoprolol succinate (TOPROL-XL) 25 MG 24 hr tablet Take 1 tablet (25 mg total) by mouth daily. 90 tablet 1  . montelukast (SINGULAIR) 10 MG tablet TAKE 1 TABLET BY MOUTH AT BEDTIME 30 tablet 3  . pantoprazole (PROTONIX) 40 MG tablet Take 1 tablet by mouth once daily 90 tablet 0  . predniSONE (DELTASONE) 2.5 MG tablet TAKE 1 TABLET BY MOUTH  DAILY WITH BREAKFAST 30 tablet 1  . influenza vac split quadrivalent PF (FLUARIX) 0.5 ML injection TAKE AS DIRECTED .5 mL 0  . ciprofloxacin (CIPRO) 500 MG tablet Take 500 mg by mouth 2 (two) times daily. (Patient not taking: Reported on 09/23/2020)    . fluticasone (FLONASE) 50 MCG/ACT nasal spray Use 2 spray(s) in each nostril once daily (Patient not taking: Reported on 09/23/2020) 16 g 5   No facility-administered medications prior to visit.    Allergies  Allergen Reactions  . Avelox [Moxifloxacin Hcl In Nacl] Diarrhea, Nausea And Vomiting and Other (See Comments)    Dizziness, Cold chills   . Bactrim [Sulfamethoxazole-Trimethoprim] Hives  . Doxycycline     Abdominal discomfort, anorexia  . Sulfamethoxazole-Trimethoprim Hives  . Augmentin [Amoxicillin-Pot Clavulanate] Hives, Itching and Swelling    Review of Systems  Constitutional: Positive for malaise/fatigue. Negative for chills and fever.  HENT: Negative for congestion and hearing loss.   Eyes: Negative for discharge.  Respiratory: Positive for cough. Negative for sputum production and shortness of breath.   Cardiovascular:  Negative for chest pain, palpitations and leg swelling.  Gastrointestinal: Negative for abdominal pain, blood in stool, constipation, diarrhea, heartburn, nausea and vomiting.  Genitourinary: Negative for dysuria, frequency, hematuria and urgency.  Musculoskeletal: Positive for myalgias. Negative for back pain and falls.  Skin: Negative for rash.  Neurological: Negative for dizziness, sensory change, loss of consciousness, weakness and headaches.  Endo/Heme/Allergies: Negative for environmental  allergies. Does not bruise/bleed easily.  Psychiatric/Behavioral: Negative for depression and suicidal ideas. The patient is not nervous/anxious and does not have insomnia.        Objective:    Physical Exam Constitutional:      General: She is not in acute distress.    Appearance: She is well-developed.  HENT:     Head: Normocephalic and atraumatic.  Eyes:     Conjunctiva/sclera: Conjunctivae normal.  Neck:     Thyroid: No thyromegaly.  Cardiovascular:     Rate and Rhythm: Normal rate and regular rhythm.     Heart sounds: Normal heart sounds. No murmur heard.   Pulmonary:     Effort: Pulmonary effort is normal. No respiratory distress.     Breath sounds: Normal breath sounds.  Abdominal:     General: Bowel sounds are normal. There is no distension.     Palpations: Abdomen is soft. There is no mass.     Tenderness: There is no abdominal tenderness.  Musculoskeletal:     Cervical back: Neck supple.  Lymphadenopathy:     Cervical: No cervical adenopathy.  Skin:    General: Skin is warm and dry.  Neurological:     Mental Status: She is alert and oriented to person, place, and time.  Psychiatric:        Behavior: Behavior normal.     BP 124/66   Pulse 63   Temp 98 F (36.7 C)   Resp 16   Ht 5' (1.524 m)   Wt 114 lb 9.6 oz (52 kg)   SpO2 99%   BMI 22.38 kg/m  Wt Readings from Last 3 Encounters:  09/28/20 114 lb 9.6 oz (52 kg)  08/13/20 113 lb (51.3 kg)  03/31/20 112 lb  (50.8 kg)    Diabetic Foot Exam - Simple   No data filed    Lab Results  Component Value Date   WBC 5.5 08/18/2020   HGB 10.4 (L) 08/18/2020   HCT 34.4 08/18/2020   PLT 266 03/31/2020   GLUCOSE 90 03/31/2020   CHOL 202 (H) 03/31/2020   TRIG 92 03/31/2020   HDL 72 03/31/2020   LDLCALC 110 (H) 03/31/2020   ALT 16 03/31/2020   AST 22 03/31/2020   NA 139 03/31/2020   K 4.1 03/31/2020   CL 105 03/31/2020   CREATININE 0.71 03/31/2020   BUN 14 03/31/2020   CO2 24 03/31/2020   TSH 1.18 03/31/2020   HGBA1C 6.5 (H) 03/31/2020    Lab Results  Component Value Date   TSH 1.18 03/31/2020   Lab Results  Component Value Date   WBC 5.5 08/18/2020   HGB 10.4 (L) 08/18/2020   HCT 34.4 08/18/2020   MCV 76 (L) 08/18/2020   PLT 266 03/31/2020   Lab Results  Component Value Date   NA 139 03/31/2020   K 4.1 03/31/2020   CO2 24 03/31/2020   GLUCOSE 90 03/31/2020   BUN 14 03/31/2020   CREATININE 0.71 03/31/2020   BILITOT 0.3 03/31/2020   ALKPHOS 61 12/06/2019   AST 22 03/31/2020   ALT 16 03/31/2020   PROT 8.0 03/31/2020   ALBUMIN 3.9 12/06/2019   CALCIUM 9.9 03/31/2020   ANIONGAP 14 09/21/2019   GFR 86.91 12/06/2019   Lab Results  Component Value Date   CHOL 202 (H) 03/31/2020   Lab Results  Component Value Date   HDL 72 03/31/2020   Lab Results  Component Value Date   LDLCALC 110 (H) 03/31/2020  Lab Results  Component Value Date   TRIG 92 03/31/2020   Lab Results  Component Value Date   CHOLHDL 2.8 03/31/2020   Lab Results  Component Value Date   HGBA1C 6.5 (H) 03/31/2020       Assessment & Plan:   Problem List Items Addressed This Visit    Anemia   Relevant Orders   CBC with Differential/Platelet   Comprehensive metabolic panel   Lipid panel   Rheumatoid arthritis [facility])    Following with rheumatology, 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 Allowed a refill of Hydrocodone which she only uses for severe pain.      Relevant Medications    HYDROcodone-acetaminophen (NORCO/VICODIN) 5-325 MG tablet   Osteoporosis    Encouraged to get adequate exercise, calcium and vitamin d intake. dexa scan ordered today      Tachycardia    RRR today      Preventative health care - Primary    Patient encouraged to maintain heart healthy diet, regular exercise, adequate sleep. Consider daily probiotics. Take medications as prescribed. Labs ordered and reviewed. MGM ordered, Dexa scan ordered. Last colonoscopy 2014, repeat in 2024      Relevant Orders   Hemoglobin A1c   CBC with Differential/Platelet   Comprehensive metabolic panel   Lipid panel   TSH   COVID-19    She has recovered fully and is encouraged to get an mRNA vaccine booster.       Controlled type 2 diabetes mellitus without complication, without long-term current use of insulin (HCC)    hgba1c acceptable, minimize simple carbs. Increase exercise as tolerated. PCV 20 given today      Relevant Orders   Hemoglobin A1c    Other Visit Diagnoses    Encounter for screening for malignant neoplasm of breast, unspecified screening modality       Relevant Orders   MM 3D SCREEN BREAST BILATERAL   Post-menopausal       Relevant Orders   DG Bone Density   Estrogen deficiency       Relevant Orders   DG Bone Density   Need for pneumococcal vaccination       Relevant Orders   Pneumococcal conjugate vaccine 20-valent (Prevnar-20) (Completed)      I have discontinued Teneshia L. Pugsley's fluticasone, ciprofloxacin, and influenza vac split quadrivalent PF. I am also having her maintain her levocetirizine, meloxicam, Hematinic/Folic Acid, famotidine, albuterol, pantoprazole, metoprolol succinate, montelukast, atorvastatin, gabapentin, predniSONE, fexofenadine, Xhance, budesonide-formoterol, and HYDROcodone-acetaminophen.  Meds ordered this encounter  Medications  . HYDROcodone-acetaminophen (NORCO/VICODIN) 5-325 MG tablet    Sig: Take 1 tablet by mouth every 6 (six) hours as  needed for moderate pain or severe pain.    Dispense:  40 tablet    Refill:  0     Danise Edge, MD

## 2020-09-28 NOTE — Assessment & Plan Note (Signed)
Encouraged to get adequate exercise, calcium and vitamin d intake. dexa scan ordered today

## 2020-09-28 NOTE — Assessment & Plan Note (Signed)
She has recovered fully and is encouraged to get an mRNA vaccine booster.

## 2020-09-28 NOTE — Assessment & Plan Note (Signed)
hgba1c acceptable, minimize simple carbs. Increase exercise as tolerated. PCV 20 given today

## 2020-09-28 NOTE — Assessment & Plan Note (Signed)
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 Allowed a refill of Hydrocodone which she only uses for severe pain.

## 2020-09-28 NOTE — Assessment & Plan Note (Addendum)
Patient encouraged to maintain heart healthy diet, regular exercise, adequate sleep. Consider daily probiotics. Take medications as prescribed. Labs ordered and reviewed. MGM ordered, Dexa scan ordered. Last colonoscopy 2014, repeat in 2024

## 2020-09-28 NOTE — Patient Instructions (Addendum)
Shingrix is the new shingles shot, 2 shots over 2-6 months, confirm coverage with insurance and document, then can return here for shots with nurse appt or at pharmacy [facility] phone at pharmacy is [phone] call for appt for Saunemin booster Preventive Care 30-62 Years Old, Female Preventive care refers to lifestyle choices and visits with your health care provider that can promote health and wellness. This includes:  A yearly physical exam. This is also called an annual wellness visit.  Regular dental and eye exams.  Immunizations.  Screening for certain conditions.  Healthy lifestyle choices, such as: ? Eating a healthy diet. ? Getting regular exercise. ? Not using drugs or products that contain nicotine and tobacco. ? Limiting alcohol use. What can I expect for my preventive care visit? Physical exam Your health care provider will check your:  Height and weight. These may be used to calculate your BMI (body mass index). BMI is a measurement that tells if you are at a healthy weight.  Heart rate and blood pressure.  Body temperature.  Skin for abnormal spots. Counseling Your health care provider may ask you questions about your:  Past medical problems.  Family's medical history.  Alcohol, tobacco, and drug use.  Emotional well-being.  Home life and relationship well-being.  Sexual activity.  Diet, exercise, and sleep habits.  Work and work Statistician.  Access to firearms.  Method of birth control.  Menstrual cycle.  Pregnancy history. What immunizations do I need? Vaccines are usually given at various ages, according to a schedule. Your health care provider will recommend vaccines for you based on your age, medical history, and lifestyle or other factors, such as travel or where you work.   What tests do I need? Blood tests  Lipid and cholesterol levels. These may be checked every 5 years, or more often if you are over 4 years old.  Hepatitis C  test.  Hepatitis B test. Screening  Lung cancer screening. You may have this screening every year starting at age 39 if you have a 30-pack-year history of smoking and currently smoke or have quit within the past 15 years.  Colorectal cancer screening. ? All adults should have this screening starting at age 73 and continuing until age 64. ? Your health care provider may recommend screening at age 21 if you are at increased risk. ? You will have tests every 1-10 years, depending on your results and the type of screening test.  Diabetes screening. ? This is done by checking your blood sugar (glucose) after you have not eaten for a while (fasting). ? You may have this done every 1-3 years.  Mammogram. ? This may be done every 1-2 years. ? Talk with your health care provider about when you should start having regular mammograms. This may depend on whether you have a family history of breast cancer.  BRCA-related cancer screening. This may be done if you have a family history of breast, ovarian, tubal, or peritoneal cancers.  Pelvic exam and Pap test. ? This may be done every 3 years starting at age 83. ? Starting at age 30, this may be done every 5 years if you have a Pap test in combination with an HPV test. Other tests  STD (sexually transmitted disease) testing, if you are at risk.  Bone density scan. This is done to screen for osteoporosis. You may have this scan if you are at high risk for osteoporosis. Talk with your health care provider about your test  results, treatment options, and if necessary, the need for more tests. Follow these instructions at home: Eating and drinking  Eat a diet that includes fresh fruits and vegetables, whole grains, lean protein, and low-fat dairy products.  Take vitamin and mineral supplements as recommended by your health care provider.  Do not drink alcohol if: ? Your health care provider tells you not to drink. ? You are pregnant, may be  pregnant, or are planning to become pregnant.  If you drink alcohol: ? Limit how much you have to 0-1 drink a day. ? Be aware of how much alcohol is in your drink. In the [place] one drink equals one 12 oz bottle of beer (355 mL), one 5 oz glass of wine (148 mL), or one 1 oz glass of hard liquor (44 mL).   Lifestyle  Take daily care of your teeth and gums. Brush your teeth every morning and night with fluoride toothpaste. Floss one time each day.  Stay active. Exercise for at least 30 minutes 5 or more days each week.  Do not use any products that contain nicotine or tobacco, such as cigarettes, e-cigarettes, and chewing tobacco. If you need help quitting, ask your health care provider.  Do not use drugs.  If you are sexually active, practice safe sex. Use a condom or other form of protection to prevent STIs (sexually transmitted infections).  If you do not wish to become pregnant, use a form of birth control. If you plan to become pregnant, see your health care provider for a prepregnancy visit.  If told by your health care provider, take low-dose aspirin daily starting at age 20.  Find healthy ways to cope with stress, such as: ? Meditation, yoga, or listening to music. ? Journaling. ? Talking to a trusted person. ? Spending time with friends and family. Safety  Always wear your seat belt while driving or riding in a vehicle.  Do not drive: ? If you have been drinking alcohol. Do not ride with someone who has been drinking. ? When you are tired or distracted. ? While texting.  Wear a helmet and other protective equipment during sports activities.  If you have firearms in your house, make sure you follow all gun safety procedures. What's next?  Visit your health care provider once a year for an annual wellness visit.  Ask your health care provider how often you should have your eyes and teeth checked.  Stay up to date on all vaccines. This information is not intended to  replace advice given to you by your health care provider. Make sure you discuss any questions you have with your health care provider. Document Revised: 02/25/2020 Document Reviewed: 02/01/2018 Elsevier Patient Education  2021 [employer].

## 2020-09-28 NOTE — Assessment & Plan Note (Signed)
RRR today 

## 2020-09-29 ENCOUNTER — Other Ambulatory Visit (INDEPENDENT_AMBULATORY_CARE_PROVIDER_SITE_OTHER): Payer: BC Managed Care – PPO

## 2020-09-29 DIAGNOSIS — D509 Iron deficiency anemia, unspecified: Secondary | ICD-10-CM | POA: Diagnosis not present

## 2020-09-29 LAB — LIPID PANEL
Cholesterol: 148 mg/dL (ref 0–200)
HDL: 78.6 mg/dL (ref >39.00)
LDL Cholesterol: 45 mg/dL (ref 0–99)
NonHDL: 69.34
Total CHOL/HDL Ratio: 2
Triglycerides: 120 mg/dL (ref 0.0–149.0)
VLDL: 24 mg/dL (ref 0.0–40.0)

## 2020-09-29 LAB — CBC WITH DIFFERENTIAL/PLATELET
Basophils Absolute: 0.1 10*3/uL (ref 0.0–0.1)
Basophils Relative: 0.8 % (ref 0.0–3.0)
Eosinophils Absolute: 0 10*3/uL (ref 0.0–0.7)
Eosinophils Relative: 0.7 % (ref 0.0–5.0)
HCT: 32 % — ABNORMAL LOW (ref 36.0–46.0)
Hemoglobin: 10.1 g/dL — ABNORMAL LOW (ref 12.0–15.0)
Lymphocytes Relative: 13.3 % (ref 12.0–46.0)
Lymphs Abs: 0.9 10*3/uL (ref 0.7–4.0)
MCHC: 31.5 g/dL (ref 30.0–36.0)
MCV: 73.5 fl — ABNORMAL LOW (ref 78.0–100.0)
Monocytes Absolute: 0.6 10*3/uL (ref 0.1–1.0)
Monocytes Relative: 8.5 % (ref 3.0–12.0)
Neutro Abs: 5.1 10*3/uL (ref 1.4–7.7)
Neutrophils Relative %: 76.7 % (ref 43.0–77.0)
Platelets: 190 10*3/uL (ref 150.0–400.0)
RBC: 4.35 Mil/uL (ref 3.87–5.11)
RDW: 15.3 % (ref 11.5–15.5)
WBC: 6.6 10*3/uL (ref 4.0–10.5)

## 2020-09-29 LAB — COMPREHENSIVE METABOLIC PANEL
ALT: 12 U/L (ref 0–35)
AST: 15 U/L (ref 0–37)
Albumin: 3.7 g/dL (ref 3.5–5.2)
Alkaline Phosphatase: 64 U/L (ref 39–117)
BUN: 10 mg/dL (ref 6–23)
CO2: 29 mEq/L (ref 19–32)
Calcium: 9.7 mg/dL (ref 8.4–10.5)
Chloride: 106 mEq/L (ref 96–112)
Creatinine, Ser: 0.84 mg/dL (ref 0.40–1.20)
GFR: 74.67 mL/min (ref >60.00)
Glucose, Bld: 87 mg/dL (ref 70–99)
Potassium: 4 mEq/L (ref 3.5–5.1)
Sodium: 141 mEq/L (ref 135–145)
Total Bilirubin: 0.2 mg/dL (ref 0.2–1.2)
Total Protein: 7.4 g/dL (ref 6.0–8.3)

## 2020-09-29 LAB — IBC + FERRITIN
Ferritin: 496.2 ng/mL — ABNORMAL HIGH (ref 10.0–291.0)
Iron: 87 ug/dL (ref 42–145)
Saturation Ratios: 37.2 % (ref 20.0–50.0)
Transferrin: 167 mg/dL — ABNORMAL LOW (ref 212.0–360.0)

## 2020-09-29 LAB — HEMOGLOBIN A1C: Hgb A1c MFr Bld: 6.6 % — ABNORMAL HIGH (ref 4.6–6.5)

## 2020-09-29 LAB — TSH: TSH: 0.84 u[IU]/mL (ref 0.35–4.50)

## 2020-10-02 ENCOUNTER — Telehealth: Payer: Self-pay | Admitting: *Deleted

## 2020-10-02 ENCOUNTER — Ambulatory Visit
Admission: RE | Admit: 2020-10-02 | Discharge: 2020-10-02 | Disposition: A | Discharge status: Home / Self Care | Payer: BC Managed Care – PPO | Source: Ambulatory Visit | Attending: Allergy | Admitting: Allergy

## 2020-10-02 ENCOUNTER — Other Ambulatory Visit: Payer: Self-pay

## 2020-10-02 ENCOUNTER — Other Ambulatory Visit: Payer: Self-pay | Admitting: Family Medicine

## 2020-10-02 NOTE — Telephone Encounter (Signed)
Left message on machine to call back to schedule appointment on or around 03/30/21.  Patient did not make follow up appointment.

## 2020-10-07 ENCOUNTER — Ambulatory Visit: Payer: BC Managed Care – PPO | Admitting: Internal Medicine

## 2020-10-07 ENCOUNTER — Encounter: Payer: Self-pay | Admitting: Internal Medicine

## 2020-10-07 ENCOUNTER — Other Ambulatory Visit: Payer: Self-pay

## 2020-10-07 VITALS — BP 124/70 | HR 81 | Ht 62.0 in | Wt 110.0 lb

## 2020-10-07 DIAGNOSIS — M0579 Rheumatoid arthritis with rheumatoid factor of multiple sites without organ or systems involvement: Secondary | ICD-10-CM

## 2020-10-07 DIAGNOSIS — J479 Bronchiectasis, uncomplicated: Secondary | ICD-10-CM | POA: Diagnosis not present

## 2020-10-07 DIAGNOSIS — J849 Interstitial pulmonary disease, unspecified: Secondary | ICD-10-CM | POA: Diagnosis not present

## 2020-10-07 MED ORDER — SODIUM CHLORIDE 0.9 % IN NEBU: 3.0000 mL | INHALATION_SOLUTION | Freq: Two times a day (BID) | RESPIRATORY_TRACT | 12 refills | Status: DC

## 2020-10-07 NOTE — Patient Instructions (Addendum)
The patient should have follow up scheduled with myself in 1 months.   Prior to next visit patient should have: CT Scan - ordered Sputum cultures - ordered  Airway Clearance: Albuterol inhaler first Then Saline nebulizer Then Flutter Valve  Don't use symbicort right now.   Getting sample ready  Collect as soon as you wake up in the morning  Do not eat or drink anything before collecting sample  Only one sample per day per cup. Collect each sample in its own separate cups   Only use cup care provider provided for you   Do not use own plastic home containers   Wash your hands before opening collection cup   Do not take off lip until you are ready to place sample in cup  How to collect sputum sample  1. Loosen sputum   Take 3 slow deep breath, so you can feel it from your stomach   If you have a vest you may use this device to help break up sputum  2. Collect sample   Wash your hands   Take lid off   Press rim of cup against lower lip    Take deep breath and hope for 2-3 seconds, then cough deeply by using stomach muscles to force out sputum. Make sure if comes from your stomach not your     not your throat or chest.   GOAL: collect 3-60mL of sputum try to avoid saliva or spit  3. Refrigerate sample   Close container    Place date on label provided for you - should be in bag   Put label on cup   Put cup in bag provided to your from office   Place in refrigerator right away. DO NOT FREEZE IT.    Bring your sample to the [address] location, unless instructed otherwise.    Reminders  Patient should have 2 cups if collecting more than one order  Obtain sample in the morning  Return to our office within 4 hours of producing sample  Produce enough sputum to reach the line marked by clinical staff   Make sure it is sputum and not spit  Any questions call the nearest office   [place]- [phone]   [place]- [phone]   [place]-  [phone]

## 2020-10-07 NOTE — Progress Notes (Signed)
[name]    [id number]    12-04-1960  Primary Care Physician:Blyth, Bryon Lions, MD  Referring Physician: Alfonse Spruce, MD [address] [place],  [place] [address] Reason for Consultation: bronchiectasis Date of Consultation: 10/07/2020  Chief complaint:   Chief Complaint  Patient presents with  . Consult    Bronchiectasis     HPI: Jamie Cox is a 60 y.o. woman with bronchiectasis who presents for new patient evaluation.  She notes for the past 4 months her mucus production has worsened ever since she had covid infection. She notes the color has turned green. Denies hemoptysis. She denies fevers, chills, night sweats, weight loss, but she takes tylenol for rhematoid arthritis. She is on 2.5 mg of prednisone - last saw NP Marcelino Duster young. Not on any DMARD therapy. Has been on methotrexate and plaquenil in the past. These were discontinued because she thought was doing better.  She is having fatigue also Stays up at night coughing -leads to poor sleep.   Has seen PCP and allergy recently and had a chest xray which shows chronic interstitial changes. She was recently started on symbicort by allergist two weeks ago. Not sure if it's helping.    Social history:  Occupation: Works as a Advertising copywriter at [employer]: lives at home with son and daughter, husband, and 3 grandchildren.  Smoking history: never smoker, passive smoke exposure in childhood.   Social History   Occupational History  . Occupation: Data processing manager  Tobacco Use  . Smoking status: Never Smoker  . Smokeless tobacco: Never Used  Vaping Use  . Vaping Use: Never used  Substance and Sexual Activity  . Alcohol use: No    Alcohol/week: 0.0 standard drinks  . Drug use: No  . Sexual activity: Not Currently    Relevant family history:  Family History  Problem Relation Age of Onset  . Liver cancer Maternal Grandmother   . Hypertension Maternal Grandmother   . Heart attack  Maternal Grandmother   . Scleroderma Mother   . Heart disease Mother   . Kidney disease Mother   . Other Mother   . Prostate cancer Father   . Diabetes Father   . Heart disease Father        CHF  . Arthritis Father        s/p hip replacement  . Cancer Father        lung cancer with mets, smoker  . Lung cancer Paternal Uncle   . Coronary artery disease Paternal Grandmother   . Hypertension Paternal Grandmother   . Liver cancer Paternal Grandmother   . Hyperlipidemia Son   . Alzheimer's disease Paternal Grandfather   . Other Sister        Cardiomegaly  . Arthritis Sister   . Heart disease Sister        rare  . Sleep apnea Brother   . Heart disease Brother   . Arthritis Daughter   . Lupus Cousin   . Arthritis Cousin   . Colon cancer Neg Hx   . Asthma Neg Hx   . Allergic rhinitis Neg Hx   . Eczema Neg Hx   . Immunodeficiency Neg Hx   . Angioedema Neg Hx     Past Medical History:  Diagnosis Date  . Allergy   . Asthma   . Atypical chest pain   . Chronic sinusitis    Followed by Dr. Jenne Pane  .  Environmental allergies   . GERD (gastroesophageal reflux disease)   . 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 History of chicken pox   . Hypercalcemia 07/19/2015  . Osteoporosis, unspecified    steroid induced  . Personal history of other diseases of circulatory system   . Rheumatoid arthritis(714.0)   . Unspecified deficiency anemia    microcytic  . Urticaria     Past Surgical History:  Procedure Laterality Date  . NASAL SINUS SURGERY  06/2012  . NASAL SINUS SURGERY     2010     Physical Exam: Blood pressure 124/70, pulse 81, height 5\' 2"  (1.575 m), weight 110 lb (49.9 kg), SpO2 97 %. Gen:      No acute distress ENT:  no nasal polyps, mucus membranes moist Lungs:   Diminished, no wheezes or crackles, frequent coughing CV:         Regular rate and rhythm; no murmurs, rubs, or gallops.  No pedal edema Abd:      + bowel sounds; soft, non-tender; no  distension MSK: no acute synovitis of DIP or PIP joints, no mechanics hands.  Skin:      Warm and dry; no rashes Neuro: normal speech, no focal facial asymmetry Psych: alert and oriented x3, normal mood and affect   Data Reviewed/Medical Decision Making:  Independent interpretation of tests: Imaging: . Review of patient's CT scan in 2018 images revealed lower lobe predominant bron. The patient's images have been independently reviewed by me.    PFTs: I have personally reviewed the patient's PFTs and in April 2022 show moderate airflow limitation, no significant response to BD.  PFT Results Latest Ref Rng & Units 03/21/2017 06/28/2016 05/06/2016 09/05/2014  FVC-Pre L 2.28 1.92 1.65 2.39  FVC-Predicted Pre % 96 84 72 102  FVC-Post L - 1.96 1.75 2.38  FVC-Predicted Post % - 85 76 102  Pre FEV1/FVC % % 71 83 84 72  Post FEV1/FCV % % - 83 82 72  FEV1-Pre L 1.63 1.59 1.38 1.73  FEV1-Predicted Pre % 87 88 76 94  FEV1-Post L - 1.62 1.43 1.73  DLCO uncorrected ml/min/mmHg - 13.41 8.52 12.63  DLCO UNC% % - 71 45 66  DLCO corrected ml/min/mmHg - 13.14 9.62 -  DLCO COR %Predicted % - 69 51 -  DLVA Predicted % - 106 87 91  TLC L - 3.31 3.27 4.03  TLC % Predicted % - 74 73 90  RV % Predicted % - 75 97 103    Labs:  Lab Results  Component Value Date   WBC 6.6 09/28/2020   HGB 10.1 (L) 09/28/2020   HCT 32.0 (L) 09/28/2020   MCV 73.5 (L) 09/28/2020   PLT 190.0 09/28/2020   Lab Results  Component Value Date   NA 141 09/28/2020   K 4.0 09/28/2020   CL 106 09/28/2020   CO2 29 09/28/2020     Immunization status:  Immunization History  Administered Date(s) Administered  . Influenza Split 04/05/2011  . Influenza Whole 04/10/2008, 05/15/2009, 05/21/2010, 02/05/2012  . Influenza,inj,Quad PF,6+ Mos 08/06/2013, 05/09/2016, 04/10/2018  . Janssen (J&J) SARS-COV-2 Vaccination 08/17/2019  . PNEUMOCOCCAL CONJUGATE-20 09/28/2020  . Pneumococcal Polysaccharide-23 07/01/2010  . Td 05/15/2009     . I reviewed prior external note(s) from PCP Dr. 14/03/2009, Dr.  . I reviewed the result(s) of the labs and imaging as noted above.  . I have ordered PFT  Assessment:  Bronchiectasis  Rheumatoid Arthritis with wrist, knee and hip involvement. Currently only  on prednisone Abnormal CT Chest with tree in bud opacities concerning for MAI infection  Plan/Recommendations: Ms. 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  She has ongoing symptoms of dyspnea and cough with sputum production related to her bronchiectasis.  I would ask her to hold her Symbicort which was recently started as inhaled corticosteroids can worsen bronchiectasis and increase the risk for bacterial infection.  Instead like to prescribe her nebulizer through DME, and start nebulized saline treatments twice a day preceded by albuterol, and followed by flutter valve for airway clearance.  She should do this religiously.  I would also like her to obtain sputum cultures x2 to send for AFB and regular sputum.  I will also obtain a repeat CT chest as her chest x-ray shows suggestion of worsening interstitial lung disease.  With rheumatoid arthritis she is at risk for UIP pattern of ILD but also has significant airway involvement including her bronchiectasis.  Her spirometry most recently showed moderate airflow limitation however I think full set of PFTs including lung volumes and DLCO may be useful in future date.  If she is having progression of her ILD, we may need to discuss additional biologic therapy for her RA including rituximab infusion.  I will have to reach out to Azucena Fallen to [facility] rheumatology.  We discussed disease management and progression at length today.     Return to Care: Return in about 4 weeks (around 11/04/2020).  Durel Salts, MD Pulmonary  and Critical Care Medicine [facility] [phone]  CC: Alfonse Spruce, *

## 2020-10-08 ENCOUNTER — Other Ambulatory Visit: Payer: Self-pay | Admitting: Family Medicine

## 2020-10-12 ENCOUNTER — Ambulatory Visit: Payer: BC Managed Care – PPO | Admitting: Podiatry

## 2020-10-12 ENCOUNTER — Encounter: Payer: Self-pay | Admitting: Podiatry

## 2020-10-12 ENCOUNTER — Other Ambulatory Visit: Payer: Self-pay

## 2020-10-12 DIAGNOSIS — M2041 Other hammer toe(s) (acquired), right foot: Secondary | ICD-10-CM

## 2020-10-12 DIAGNOSIS — M2012 Hallux valgus (acquired), left foot: Secondary | ICD-10-CM

## 2020-10-12 DIAGNOSIS — B351 Tinea unguium: Secondary | ICD-10-CM

## 2020-10-12 DIAGNOSIS — M2011 Hallux valgus (acquired), right foot: Secondary | ICD-10-CM

## 2020-10-12 DIAGNOSIS — M2042 Other hammer toe(s) (acquired), left foot: Secondary | ICD-10-CM

## 2020-10-12 DIAGNOSIS — L84 Corns and callosities: Secondary | ICD-10-CM

## 2020-10-12 DIAGNOSIS — M79676 Pain in unspecified toe(s): Secondary | ICD-10-CM | POA: Diagnosis not present

## 2020-10-12 DIAGNOSIS — E119 Type 2 diabetes mellitus without complications: Secondary | ICD-10-CM | POA: Diagnosis not present

## 2020-10-14 ENCOUNTER — Inpatient Hospital Stay: Admission: RE | Admit: 2020-10-14 | Payer: BC Managed Care – PPO | Source: Ambulatory Visit

## 2020-10-18 NOTE — Progress Notes (Signed)
Subjective: Jamie Cox is a pleasant 60 y.o. female patient seen today for preventative diabetic foot care with h/o corns/callouses b/l andpainful thick toenails that are difficult to trim. Pain interferes with ambulation. Aggravating factors include wearing enclosed shoe gear. Pain is relieved with periodic professional debridement.  She voices no new pedal concerns on today's visit.   PCP is Dr. Danise Edge and last visit was 09/28/2020.  Allergies  Allergen Reactions  . Avelox [Moxifloxacin Hcl In Nacl] Diarrhea, Nausea And Vomiting and Other (See Comments)    Dizziness, Cold chills   . Bactrim [Sulfamethoxazole-Trimethoprim] Hives  . Doxycycline     Abdominal discomfort, anorexia  . Sulfamethoxazole-Trimethoprim Hives  . Augmentin [Amoxicillin-Pot Clavulanate] Hives, Itching and Swelling    Objective: Physical Exam  General: Jamie Cox is a pleasant 60 y.o. African American female, WD, WN in NAD. AAO x 3.   Vascular:  Capillary refill time to digits immediate b/l. Palpable pedal pulses b/l LE. Pedal hair present b/l. Lower extremity skin temperature gradient within normal limits. No pain with calf compression b/l. No edema noted b/l lower extremities.   Dermatological:  Pedal skin with normal turgor, texture and tone bilaterally. No open wounds bilaterally. No interdigital macerations bilaterally. Toenails 1-5 b/l elongated, discolored, dystrophic, thickened, crumbly with subungual debris and tenderness to dorsal palpation.  Hyperkeratotic lesion(s) digits 2, 3, 5 b/l, submet head 2 b/l, submet head 3 b/l. No erythema, no edema, no drainage, no fluctuance noted.  Musculoskeletal:  Normal muscle strength 5/5 to all lower extremity muscle groups bilaterally. No pain crepitus or joint limitation noted with ROM b/l. Hallux valgus with bunion deformity noted b/l lower extremities. Hammertoes noted to the 2-5 bilaterally.  Neurological:  Protective sensation intact 5/5  intact bilaterally with 10g monofilament b/l. Vibratory sensation intact b/l. Clonus negative b/l.  Assessment and Plan:  1. Pain due to onychomycosis of toenail   2. Corns and callosities   3. Acquired hammertoes of both feet   4. Hallux valgus, acquired, bilateral   5. Controlled type 2 diabetes mellitus without complication, without long-term current use of insulin (HCC)     -Examined patient. -Continue diabetic foot care principles. -Patient to continue soft, supportive shoe gear daily. -Toenails 1-5 b/l were debrided in length and girth with sterile nail nippers and dremel without iatrogenic bleeding.  -Corn(s) L 2nd toe, L 3rd toe, L 5th toe, R 2nd toe, R 3rd toe and R 5th toe and callus(es) submet head 2 left foot, submet head 2 right foot, submet head 3 left foot and submet head 3 right foot were pared utilizing sterile scalpel blade without incident. Total number debrided =10. -Patient to report any pedal injuries to medical professional immediately. -Patient/POA to call should there be question/concern in the interim.  Return in about 3 months (around 01/12/2021).  Freddie Breech, DPM

## 2020-10-19 ENCOUNTER — Ambulatory Visit (HOSPITAL_BASED_OUTPATIENT_CLINIC_OR_DEPARTMENT_OTHER): Payer: BC Managed Care – PPO

## 2020-10-19 ENCOUNTER — Other Ambulatory Visit (HOSPITAL_BASED_OUTPATIENT_CLINIC_OR_DEPARTMENT_OTHER): Payer: BC Managed Care – PPO

## 2020-10-27 ENCOUNTER — Ambulatory Visit (HOSPITAL_BASED_OUTPATIENT_CLINIC_OR_DEPARTMENT_OTHER)
Admission: RE | Admit: 2020-10-27 | Discharge: 2020-10-27 | Disposition: A | Discharge status: Home / Self Care | Payer: BC Managed Care – PPO | Source: Ambulatory Visit | Attending: Family Medicine | Admitting: Family Medicine

## 2020-10-27 ENCOUNTER — Telehealth: Payer: Self-pay | Admitting: Internal Medicine

## 2020-10-27 ENCOUNTER — Ambulatory Visit (HOSPITAL_COMMUNITY)
Admission: EM | Admit: 2020-10-27 | Discharge: 2020-10-27 | Disposition: A | Discharge status: Home / Self Care | Payer: BC Managed Care – PPO | Attending: Internal Medicine | Admitting: Internal Medicine

## 2020-10-27 ENCOUNTER — Other Ambulatory Visit: Payer: Self-pay

## 2020-10-27 ENCOUNTER — Ambulatory Visit: Payer: BC Managed Care – PPO | Attending: Internal Medicine

## 2020-10-27 ENCOUNTER — Encounter (HOSPITAL_BASED_OUTPATIENT_CLINIC_OR_DEPARTMENT_OTHER): Payer: Self-pay

## 2020-10-27 ENCOUNTER — Encounter (HOSPITAL_COMMUNITY): Payer: Self-pay | Admitting: *Deleted

## 2020-10-27 DIAGNOSIS — E2839 Other primary ovarian failure: Secondary | ICD-10-CM | POA: Diagnosis present

## 2020-10-27 DIAGNOSIS — Z1239 Encounter for other screening for malignant neoplasm of breast: Secondary | ICD-10-CM | POA: Diagnosis present

## 2020-10-27 DIAGNOSIS — Z23 Encounter for immunization: Secondary | ICD-10-CM

## 2020-10-27 DIAGNOSIS — Z780 Asymptomatic menopausal state: Secondary | ICD-10-CM | POA: Insufficient documentation

## 2020-10-27 DIAGNOSIS — R109 Unspecified abdominal pain: Secondary | ICD-10-CM

## 2020-10-27 LAB — POCT URINALYSIS DIPSTICK, ED / UC
Bilirubin Urine: NEGATIVE
Glucose, UA: NEGATIVE mg/dL
Hgb urine dipstick: NEGATIVE
Ketones, ur: NEGATIVE mg/dL
Leukocytes,Ua: NEGATIVE
Nitrite: NEGATIVE
Protein, ur: NEGATIVE mg/dL
Specific Gravity, Urine: 1.015 (ref 1.005–1.030)
Urobilinogen, UA: 0.2 mg/dL (ref 0.0–1.0)
pH: 6.5 (ref 5.0–8.0)

## 2020-10-27 MED ORDER — KETOROLAC TROMETHAMINE 60 MG/2ML IM SOLN
60.0000 mg | Freq: Once | INTRAMUSCULAR | Status: AC
  Administered 2020-10-27: 60 mg via INTRAMUSCULAR

## 2020-10-27 MED ORDER — KETOROLAC TROMETHAMINE 60 MG/2ML IM SOLN
INTRAMUSCULAR | Status: AC
  Filled 2020-10-27: qty 2

## 2020-10-27 NOTE — Discharge Instructions (Signed)
As we discussed your symptoms may be related to a pulled muscle.  Less likely but possibility would be kidney stone.  You have been given a shot of Toradol at the clinic today.  Please avoid any Motrin, Aleve or ibuprofen for the next 24 hours.  After that take every 8 hours as needed for pain.  Use heating pad for 20 minutes at a time several times daily.  Light stretching.  Please return to the emergency department for any worsening pain or symptoms.

## 2020-10-27 NOTE — ED Provider Notes (Signed)
[facility]    CSN: [id number] Arrival date & time: 10/27/20  1843      History   Chief Complaint Chief Complaint  Patient presents with  . Flank Pain    HPI Jamie Cox is a 60 y.o. female presents urgent care today with complaints of right sided pain.  Patient describes sudden onset of right-sided flank and side pain while at work today and pushing a heavy cart.  Patient describes pain as cramping and intense with associated nausea.  Pain subsided without any intervention and now feels just "tender" in that area.  Patient denies any recent fever or chills, chest pain, SOB, ABD pain, N/V/D, dysuria or hematuria.    Past Medical History:  Diagnosis Date  . Allergy   . Asthma   . Atypical chest pain   . Chronic sinusitis    Followed by Dr. Jenne Pane  . Environmental allergies   . GERD (gastroesophageal reflux disease)   . 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 History of chicken pox   . Hypercalcemia 07/19/2015  . Osteoporosis, unspecified    steroid induced  . Personal history of other diseases of circulatory system   . Rheumatoid arthritis(714.0)   . Unspecified deficiency anemia    microcytic  . Urticaria     Patient Active Problem List   Diagnosis Date Noted  . Controlled type 2 diabetes mellitus without complication, without long-term current use of insulin (HCC) 09/28/2020  . Chronic rhinitis 09/23/2020  . Recurrent infections 09/23/2020  . Bilateral impacted cerumen 08/03/2020  . COVID-19 09/19/2019  . Acute recurrent pansinusitis 05/29/2018  . Atypical chest pain 08/09/2017  . Onychomycosis 05/08/2017  . Cough variant asthma with possible UACS component  05/05/2017  . Asthma 02/13/2017  . Allergy 08/03/2016  . Maxillary sinusitis, chronic 08/03/2016  . Bronchiectasis without acute exacerbation (HCC) 06/29/2016  . 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 Cervical cancer screening 03/04/2016  . Hypercalcemia 07/19/2015  .  History of chicken pox   . On prednisone therapy 09/25/2013  . Intercostal muscle pain 09/20/2013  . Preventative health care 08/06/2013  . GERD (gastroesophageal reflux disease) 08/01/2012  . Tachycardia 07/28/2012  . Health care maintenance 01/31/2012  . Chronic sinusitis 01/31/2012  . High risk medication use 08/17/2011  . 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 Chronic cough 09/06/2010  . Rheumatoid arthritis (HCC) 05/17/2007  . Anemia 06/23/2006  . Osteoporosis 06/23/2006    Past Surgical History:  Procedure Laterality Date  . NASAL SINUS SURGERY  06/2012  . NASAL SINUS SURGERY     2010    OB History   No obstetric history on file.      Home Medications    Prior to Admission medications   Medication Sig Start Date End Date Taking? Authorizing Provider  albuterol (VENTOLIN HFA) 108 (90 Base) MCG/ACT inhaler INHALE ONE TO TWO PUFFS INTO THE LUNGS EVERY 6 HOURS AS NEEDED FOR WHEEZING OR SHORTNESS OF BREATH 03/31/20   [name], MD  atorvastatin (LIPITOR) 10 MG tablet Take 1 tablet (10 mg total) by mouth daily. 07/20/20   [name], MD  budesonide-formoterol (SYMBICORT) 80-4.5 MCG/ACT inhaler INHALE 2 PUFFS INTO THE LUNGS TWO TIMES DAILY 08/13/20   Alfonse Spruce, MD  famotidine (PEPCID) 40 MG tablet TAKE 1 TABLET BY MOUTH EVERY DAY 10/02/20   [name], MD  fexofenadine (ALLEGRA) 180 MG tablet Take 180 mg by mouth daily.    [provider]  Fluticasone Propionate (XHANCE) 93 MCG/ACT EXHU Place 2 sprays into both nostrils in the morning and at bedtime. 08/13/20   Alfonse Spruce, MD  gabapentin (NEURONTIN) 300 MG capsule Take 1 capsule (300 mg total) by mouth 3 (three) times daily. 08/04/20   [name], MD  HEMATINIC/FOLIC ACID 324-1 MG TABS TAKE 1 TABLET BY MOUTH EVERY DAY 03/16/20   [name], MD  HYDROcodone-acetaminophen (NORCO/VICODIN) 5-325 MG tablet Take 1 tablet by mouth every 6 (six) hours as needed for moderate pain  or severe pain. 09/28/20   [name], MD  levocetirizine (XYZAL) 5 MG tablet Take 1 tablet (5 mg total) by mouth every evening. 10/25/19   Saguier, Ramon Dredge, PA-C  meloxicam (MOBIC) 7.5 MG tablet TAKE 1 TO 2 TABLETS BY MOUTH ONCE DAILY AS NEEDED FOR PAIN 01/27/20   [name], MD  metoprolol succinate (TOPROL-XL) 25 MG 24 hr tablet Take 1 tablet (25 mg total) by mouth daily. 07/03/20   [name], MD  montelukast (SINGULAIR) 10 MG tablet TAKE 1 TABLET BY MOUTH AT BEDTIME 07/09/20   [name], MD  pantoprazole (PROTONIX) 40 MG tablet Take 1 tablet by mouth once daily 04/07/20   [name], MD  predniSONE (DELTASONE) 2.5 MG tablet Take 1 tablet by mouth once daily with breakfast 10/08/20   [name], MD  sodium chloride 0.9 % nebulizer solution Take 3 mLs by nebulization in the morning and at bedtime. 10/07/20   Charlott Holler, MD    Family History Family History  Problem Relation Age of Onset  . Liver cancer Maternal Grandmother   . Hypertension Maternal Grandmother   . Heart attack Maternal Grandmother   . Scleroderma Mother   . Heart disease Mother   . Kidney disease Mother   . Other Mother   . Prostate cancer Father   . Diabetes Father   . Heart disease Father        CHF  . Arthritis Father        s/p hip replacement  . Cancer Father        lung cancer with mets, smoker  . Lung cancer Paternal Uncle   . Coronary artery disease Paternal Grandmother   . Hypertension Paternal Grandmother   . Liver cancer Paternal Grandmother   . Hyperlipidemia Son   . Alzheimer's disease Paternal Grandfather   . Other Sister        Cardiomegaly  . Arthritis Sister   . Heart disease Sister        rare  . Sleep apnea Brother   . Heart disease Brother   . Arthritis Daughter   . Lupus Cousin   . Arthritis Cousin   . Colon cancer Neg Hx   . Asthma Neg Hx   . Allergic rhinitis Neg Hx   . Eczema Neg Hx   . Immunodeficiency Neg Hx   . Angioedema Neg Hx     Social  History Social History   Tobacco Use  . Smoking status: Never Smoker  . Smokeless tobacco: Never Used  Vaping Use  . Vaping Use: Never used  Substance Use Topics  . Alcohol use: No    Alcohol/week: 0.0 standard drinks  . Drug use: No     Allergies   Avelox [moxifloxacin hcl in nacl], Bactrim [sulfamethoxazole-trimethoprim], Doxycycline, Sulfamethoxazole-trimethoprim, and Augmentin [amoxicillin-pot clavulanate]   Review of Systems As stated in HPI otherwise negative   Physical Exam Triage Vital Signs ED Triage Vitals  Enc Vitals Group     BP 10/27/20 1925 (!) 158/85     Pulse Rate 10/27/20 1925 84     Resp 10/27/20 1925 16     Temp 10/27/20 1925 98.6 F (37 C)     Temp src --      SpO2 10/27/20 1925 100 %     Weight --      Height --      Head Circumference --      Peak Flow --      Pain Score 10/27/20 1922 3     Pain Loc --      Pain Edu? --      Excl. in GC? --    No data found.  Updated Vital Signs BP (!) 158/85   Pulse 84   Temp 98.6 F (37 C)   Resp 16   SpO2 100%   Visual Acuity Right Eye Distance:   Left Eye Distance:   Bilateral Distance:    Right Eye Near:   Left Eye Near:    Bilateral Near:     Physical Exam Constitutional:      General: She is not in acute distress.    Appearance: Normal appearance. She is normal weight. She is not ill-appearing or toxic-appearing.  Eyes:     Extraocular Movements: Extraocular movements intact.  Cardiovascular:     Rate and Rhythm: Normal rate and regular rhythm.     Heart sounds: No murmur heard. No gallop.   Pulmonary:     Effort: Pulmonary effort is normal.     Breath sounds: Normal breath sounds. No wheezing, rhonchi or rales.  Abdominal:     General: Bowel sounds are normal.     Palpations: Abdomen is soft.     Tenderness: There is no abdominal tenderness. There is right CVA tenderness. There is no left CVA tenderness, guarding or rebound.  Musculoskeletal:        General: No swelling.  Normal range of motion.  Skin:    General: Skin is warm and dry.  Neurological:     General: No focal deficit present.     Mental Status: She is alert and oriented to person, place, and time.  Psychiatric:        Mood and Affect: Mood normal.        Behavior: Behavior normal.      UC Treatments / Results  Labs (all labs ordered are listed, but only abnormal results are displayed) Labs Reviewed  POCT URINALYSIS DIPSTICK, ED / UC    EKG   Radiology DG Bone Density  Result Date: 10/27/2020 EXAM: DUAL X-RAY ABSORPTIOMETRY (DXA) FOR BONE MINERAL DENSITY IMPRESSION: STACEY A BLYTH Your patient Anneli Bing completed a BMD test on 10/27/2020 using the Lunar IDXA DXA System (analysis version: 16.SP2) manufactured by [employer]. The following summarizes the results of our evaluation. [facility] PATIENT: Name: Anarosa, Kubisiak Patient ID: [id number] Birth Date: 04-Sep-1960 Height: 60.5 in. Gender: Female Measured: 10/27/2020 Weight: 111.8 lbs. Indications: African-American, Chronic use of Prednisone, Early Menopause, Estrogen Deficiency, Family Hx of Osteoporosis, History of Osteoporosis, Post Menopausal, Rheumatoid Arthritis, History of osteoporosis Fractures: Treatments: Actonel(Risedronate), Boniva, Calcium, Multivitamin, Prednisolone, Vitamin D ASSESSMENT: The BMD measured at Forearm Radius 33% is 0.604 g/cm2 with a T-score of -3.1. This patient is considered osteoporotic according to World Health Organization [facility]) criteria. Compared with the prior study on, 09/04/2015 the BMD of the total mean shows a statistically significant decrease. The scan quality is good. Site Region  Measured Date Measured Age WHO YA BMD Classification T-score AP Spine L1-L4 10/27/[age] 62.1 Osteoporosis -2.5 0.888 g/cm2 AP Spine L1-L4 09/04/2015 56.9 Osteopenia -2.0 0.948 g/cm2 AP Spine L1-L4 09/22/2006 48.0 Osteopenia -2.1 0.940 g/cm2 DualFemur Total Mean 10/27/2020 62.1 Osteopenia -1.3 0.843 g/cm2 DualFemur Total Mean  09/04/2015 56.9 Osteopenia -1.1 0.873 g/cm2 DualFemur Total Mean 09/22/2006 48.0 Osteopenia -1.2 0.860 g/cm2 Left Forearm Radius 33% 10/27/2020 62.1 Osteoporosis -3.1 0.604 g/cm2 Left Forearm Radius 33% 09/22/2006 48.0 Osteoporosis -3.0 0.619 g/cm2 World Health Organization [facility]) criteria for post-menopausal, Caucasian Women: Normal       T-score at or above -1 SD Osteopenia   T-score between -1 and -2.5 SD Osteoporosis T-score at or below -2.5 SD RECOMMENDATION: 1. All patients should optimize calcium and vitamin D intake. 2. Consider FDA-approved medical therapies in postmenopausal women and men aged 32 years and older, based on the following: a. A hip or vertebral(clinical or morphometric) fracture. b. T-Score < -2.5 at the femoral neck or spine after appropriate evaluation to exclude secondary causes c. Low bone mass (T-score between -1.0 and -2.5 at the femoral neck or spine) and a 10 year probability of a hip fracture >3% or a 10 year probability of major osteoporosis-related fracture > 20% based on the [place]-adapted WHO algorithm d. Clinical judgement and/or patient preferences may indicate treatment for people with 10-year fracture probabilities above or below these levels FOLLOW-UP: Patients with diagnosis of osteoporosis or at high risk for fracture should have regular bone mineral density tests. For patients eligible for Medicare, routine testing is allowed once every 2 years. The testing frequency can be increased to one year for patients who have rapidly progressing disease, those who are receiving or discontinuing medical therapy to restore bone mass, or have additional risk factors. I have reviewed this report, and agree with the above findings. Mark A. Tyron Russell, M.D. [facility] Radiology Electronically Signed   By: Ulyses Southward M.D.   On: 10/27/2020 15:30    Procedures Procedures (including critical care time)  Medications Ordered in UC Medications  ketorolac (TORADOL) injection 60 mg (60 mg  Intramuscular Given 10/27/20 2026)    Initial Impression / Assessment and Plan / UC Course  I have reviewed the triage vital signs and the nursing notes.  Pertinent labs & imaging results that were available during my care of the patient were reviewed by me and considered in my medical decision making (see chart for details).  Right flank pain -Sudden onset and now improved without any intervention -U/a negative for hematuria though cannot rule out renal stone -More likely would be musculoskeletal strain given activity at onset -IM Toradol in office, as needed ibuprofen starting tomorrow.  Frequent heat and light stretching -Return precautions discussed  Reviewed expections re: course of current medical issues. Questions answered. Outlined signs and symptoms indicating need for more acute intervention. Pt verbalized understanding. AVS given   Final Clinical Impressions(s) / UC Diagnoses   Final diagnoses:  Right flank pain     Discharge Instructions     As we discussed your symptoms may be related to a pulled muscle.  Less likely but possibility would be kidney stone.  You have been given a shot of Toradol at the clinic today.  Please avoid any Motrin, Aleve or ibuprofen for the next 24 hours.  After that take every 8 hours as needed for pain.  Use heating pad for 20 minutes at a time several times daily.  Light stretching.  Please return to the emergency department  for any worsening pain or symptoms.    ED Prescriptions    None     PDMP not reviewed this encounter.   Rolla Etienne, NP 10/27/20 2037

## 2020-10-27 NOTE — ED Triage Notes (Signed)
Pt reports RT flank pain that started today, Pt reports she had a Covid booster today.

## 2020-10-27 NOTE — Progress Notes (Signed)
   Covid-19 Vaccination Clinic  Name:  Jamie Cox    MRN: [id number] DOB: 07-25-1960  10/27/2020  Ms. Kolasinski was observed post Covid-19 immunization for 15 minutes without incident. She was provided with Vaccine Information Sheet and instruction to access the V-Safe system.   Ms. Lenoir was instructed to call 911 with any severe reactions post vaccine: Marland Kitchen Difficulty breathing  . Swelling of face and throat  . A fast heartbeat  . A bad rash all over body  . Dizziness and weakness   Immunizations Administered    Name Date Dose VIS Date Route   PFIZER Comrnaty(Gray TOP) Covid-19 Vaccine 10/27/2020 11:51 AM 0.3 mL 05/14/2020 Intramuscular   Manufacturer: [employer], [employer]   Lot: [id number]   NDC: [phone]

## 2020-10-28 ENCOUNTER — Telehealth: Payer: Self-pay | Admitting: Internal Medicine

## 2020-10-28 NOTE — Telephone Encounter (Signed)
Called Melissa at [employer].  She does not see order in their system.  She is going to check with Tacey Ruiz and get order expedited and they will call pt. I called pt and made her aware.  Nothing further needed.

## 2020-10-28 NOTE — Telephone Encounter (Signed)
Nothing noted in message. Will close encounter.  

## 2020-10-30 ENCOUNTER — Telehealth: Payer: Self-pay

## 2020-10-30 NOTE — Telephone Encounter (Signed)
-----   Message from Jones Bales, CMA sent at 10/29/2020 10:50 AM EDT ----- PA approved Ok to call patient and schedule prolia at convenience.

## 2020-10-30 NOTE — Telephone Encounter (Signed)
Called pt to get scheduled for her prolia injection on nurse visits.

## 2020-11-03 ENCOUNTER — Other Ambulatory Visit: Payer: Self-pay

## 2020-11-03 ENCOUNTER — Other Ambulatory Visit (HOSPITAL_BASED_OUTPATIENT_CLINIC_OR_DEPARTMENT_OTHER): Payer: Self-pay

## 2020-11-03 ENCOUNTER — Ambulatory Visit (INDEPENDENT_AMBULATORY_CARE_PROVIDER_SITE_OTHER)
Admission: RE | Admit: 2020-11-03 | Discharge: 2020-11-03 | Disposition: A | Discharge status: Home / Self Care | Payer: BC Managed Care – PPO | Source: Ambulatory Visit | Attending: Internal Medicine | Admitting: Internal Medicine

## 2020-11-03 DIAGNOSIS — J849 Interstitial pulmonary disease, unspecified: Secondary | ICD-10-CM

## 2020-11-03 MED ORDER — PFIZER-BIONT COVID-19 VAC-TRIS 30 MCG/0.3ML IM SUSP
INTRAMUSCULAR | 0 refills | Status: DC
  Filled 2020-11-03: qty 0.3, 1d supply, fill #0

## 2020-11-10 ENCOUNTER — Encounter: Payer: Self-pay | Admitting: Internal Medicine

## 2020-11-10 ENCOUNTER — Ambulatory Visit (INDEPENDENT_AMBULATORY_CARE_PROVIDER_SITE_OTHER): Payer: BC Managed Care – PPO | Admitting: Internal Medicine

## 2020-11-10 ENCOUNTER — Other Ambulatory Visit: Payer: Self-pay

## 2020-11-10 VITALS — BP 118/72 | HR 101 | Temp 98.0°F | Ht 60.0 in | Wt 112.0 lb

## 2020-11-10 DIAGNOSIS — J479 Bronchiectasis, uncomplicated: Secondary | ICD-10-CM

## 2020-11-10 NOTE — Progress Notes (Signed)
Jamie Cox    [id number]    09-19-1960  Primary Care Physician:Blyth, Bryon Lions, MD Date of Appointment: 11/10/2020 Established Patient Visit  Chief complaint:   Chief Complaint  Patient presents with  . Follow-up    Coughing green/ cloudy phlegm.     HPI: Jamie Cox is a 60 y.o. woman with bronchiectasis  Interval Updates: Here for follow up after CT Chest which shows persistent bronchiectasis and mucus plugging concerning for atypical infection. She has not brought in any sputum samples yet.   She is feeling fatigued, but no fevers, chills, night sweats or weight loss. Appetite is ok, no nausea, vomiting.   She has not started nebulized saline yet but does have a solution at home. Minimal albuterol use.  She has stopped symbicort, noticed no difference  I have reviewed the patient's family social and past medical history and updated as appropriate.   Past Medical History:  Diagnosis Date  . Allergy   . Asthma   . Atypical chest pain   . Chronic sinusitis    Followed by Dr. Jenne Pane  . Environmental allergies   . GERD (gastroesophageal reflux disease)   . 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 History of chicken pox   . Hypercalcemia 07/19/2015  . Osteoporosis, unspecified    steroid induced  . Personal history of other diseases of circulatory system   . Rheumatoid arthritis(714.0)   . Unspecified deficiency anemia    microcytic  . Urticaria     Past Surgical History:  Procedure Laterality Date  . NASAL SINUS SURGERY  06/2012  . NASAL SINUS SURGERY     2010    Family History  Problem Relation Age of Onset  . Liver cancer Maternal Grandmother   . Hypertension Maternal Grandmother   . Heart attack Maternal Grandmother   . Scleroderma Mother   . Heart disease Mother   . Kidney disease Mother   . Other Mother   . Prostate cancer Father   . Diabetes Father   . Heart disease Father        CHF  . Arthritis Father        s/p  hip replacement  . Cancer Father        lung cancer with mets, smoker  . Lung cancer Paternal Uncle   . Coronary artery disease Paternal Grandmother   . Hypertension Paternal Grandmother   . Liver cancer Paternal Grandmother   . Hyperlipidemia Son   . Alzheimer's disease Paternal Grandfather   . Other Sister        Cardiomegaly  . Arthritis Sister   . Heart disease Sister        rare  . Sleep apnea Brother   . Heart disease Brother   . Arthritis Daughter   . Lupus Cousin   . Arthritis Cousin   . Colon cancer Neg Hx   . Asthma Neg Hx   . Allergic rhinitis Neg Hx   . Eczema Neg Hx   . Immunodeficiency Neg Hx   . Angioedema Neg Hx     Social History   Occupational History  . Occupation: Data processing manager  Tobacco Use  . Smoking status: Never Smoker  . Smokeless tobacco: Never Used  Vaping Use  . Vaping Use: Never used  Substance and Sexual Activity  . Alcohol use: No    Alcohol/week: 0.0 standard drinks  . Drug use: No  . Sexual activity: Not Currently  Physical Exam: Blood pressure 118/72, pulse (!) 101, temperature 98 F (36.7 C), temperature source Temporal, height 5' (1.524 m), weight 112 lb (50.8 kg), SpO2 100 %.  Gen:      No acute distress Lungs:    Diminished bilaterally, no wheezes or crackles CV:         Tachycardic, regular no murmurs, rubs, or gallops.  No pedal edema   Data Reviewed: Imaging: I have personally reviewed the CT Chest obtained May 31st which shows persistenet lower lobe predominant bronchiectasis, nodules, and   PFTs:  PFT Results Latest Ref Rng & Units 03/21/2017 06/28/2016 05/06/2016 09/05/2014  FVC-Pre L 2.28 1.92 1.65 2.39  FVC-Predicted Pre % 96 84 72 102  FVC-Post L - 1.96 1.75 2.38  FVC-Predicted Post % - 85 76 102  Pre FEV1/FVC % % 71 83 84 72  Post FEV1/FCV % % - 83 82 72  FEV1-Pre L 1.63 1.59 1.38 1.73  FEV1-Predicted Pre % 87 88 76 94  FEV1-Post L - 1.62 1.43 1.73  DLCO uncorrected ml/min/mmHg - 13.41 8.52 12.63   DLCO UNC% % - 71 45 66  DLCO corrected ml/min/mmHg - 13.14 9.62 -  DLCO COR %Predicted % - 69 51 -  DLVA Predicted % - 106 87 91  TLC L - 3.31 3.27 4.03  TLC % Predicted % - 74 73 90  RV % Predicted % - 75 97 103   I have personally reviewed the patient's PFTs and they show mild restriction to ventilation, no airflow limitation, no significant response to bronchodilator. Moderately reduced diffusion capacity.  Labs: Lab Results  Component Value Date   WBC 6.6 09/28/2020   HGB 10.1 (L) 09/28/2020   HCT 32.0 (L) 09/28/2020   MCV 73.5 (L) 09/28/2020   PLT 190.0 09/28/2020   Lab Results  Component Value Date   NA 141 09/28/2020   K 4.0 09/28/2020   CL 106 09/28/2020   CO2 29 09/28/2020    Immunization status: Immunization History  Administered Date(s) Administered  . Influenza Split 04/05/2011  . Influenza Whole 04/10/2008, 05/15/2009, 05/21/2010, 02/05/2012  . Influenza,inj,Quad PF,6+ Mos 08/06/2013, 05/09/2016, 04/10/2018  . Janssen (J&J) SARS-COV-2 Vaccination 08/17/2019  . PFIZER Comirnaty(Gray Top)Covid-19 Tri-Sucrose Vaccine 10/27/2020  . PNEUMOCOCCAL CONJUGATE-20 09/28/2020  . Pneumococcal Polysaccharide-23 07/01/2010  . 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  Rheumatoid Arthritis Concern for MAI infection  Plan/Recommendations: Plan is to start airway clearance with nebulized saline and flutter valve - reviewed this with her today again. Instructed her to bring in sputum cultures so we can rule out MAI - bronchoscopy would be the next step but I would prefer not to put her through that since she is bringing up sputum. Instructions given again.   Will obtain PFTs prior to next visit.    Return to Care: Return in about 2 months (around 01/10/2021).   Durel Salts, MD Pulmonary and Critical Care Medicine [facility] Office:[phone]

## 2020-11-10 NOTE — Patient Instructions (Addendum)
The patient should have follow up scheduled with APP or myself in 2 months.   Prior to next visit patient should have: PFT ( 1 hour, before next visit) Bring sputum samples - instructions below. In the cups we gave you last time.  Start nebulized saline twice a day with flutter valve to help bring up mucus.    Getting sample ready  Collect as soon as you wake up in the morning  Do not eat or drink anything before collecting sample  Only one sample per day per cup. Collect each sample in its own separate cups   Only use cup care provider provided for you   Do not use own plastic home containers   Wash your hands before opening collection cup   Do not take off lip until you are ready to place sample in cup  How to collect sputum sample  1. Loosen sputum   Take 3 slow deep breath, so you can feel it from your stomach   If you have a vest you may use this device to help break up sputum  2. Collect sample   Wash your hands   Take lid off   Press rim of cup against lower lip    Take deep breath and hope for 2-3 seconds, then cough deeply by using stomach muscles to force out sputum. Make sure if comes from your stomach not your     not your throat or chest.   GOAL: collect 3-34mL of sputum try to avoid saliva or spit  3. Refrigerate sample   Close container    Place date on label provided for you - should be in bag   Put label on cup   Put cup in bag provided to your from office   Place in refrigerator right away. DO NOT FREEZE IT.    Bring your sample to the [address] location, unless instructed otherwise.    Reminders  Patient should have 2 cups if collecting more than one order  Obtain sample in the morning  Return to our office within 4 hours of producing sample  Produce enough sputum to reach the line marked by clinical staff   Make sure it is sputum and not spit  Any questions call the nearest office   [place]- [phone]   [place]-  [phone]   [place]- [phone]

## 2020-11-18 ENCOUNTER — Other Ambulatory Visit: Payer: Self-pay | Admitting: Family Medicine

## 2020-11-18 NOTE — Telephone Encounter (Signed)
Dr Abner Greenspan -- I sent Singulair refill.  Please advise regarding pt's request for prednisone?

## 2020-12-16 ENCOUNTER — Other Ambulatory Visit: Payer: Self-pay | Admitting: Family Medicine

## 2021-01-08 ENCOUNTER — Other Ambulatory Visit (HOSPITAL_COMMUNITY): Payer: BC Managed Care – PPO

## 2021-01-11 ENCOUNTER — Other Ambulatory Visit: Payer: Self-pay | Admitting: Family Medicine

## 2021-01-11 ENCOUNTER — Ambulatory Visit: Payer: BC Managed Care – PPO | Admitting: Acute Care

## 2021-01-12 ENCOUNTER — Ambulatory Visit: Payer: BC Managed Care – PPO | Admitting: Podiatry

## 2021-01-18 ENCOUNTER — Other Ambulatory Visit: Payer: Self-pay

## 2021-01-18 DIAGNOSIS — M069 Rheumatoid arthritis, unspecified: Secondary | ICD-10-CM

## 2021-01-18 NOTE — Telephone Encounter (Signed)
Requested Prescriptions   Pending Prescriptions Disp Refills   HYDROcodone-acetaminophen (NORCO/VICODIN) 5-325 MG tablet 40 tablet 0    Sig: Take 1 tablet by mouth every 6 (six) hours as needed for moderate pain or severe pain.   Pt called for refill

## 2021-01-19 MED ORDER — HYDROCODONE-ACETAMINOPHEN 5-325 MG PO TABS: 1.0000 | ORAL_TABLET | Freq: Four times a day (QID) | ORAL | 0 refills | Status: DC | PRN

## 2021-01-19 NOTE — Telephone Encounter (Signed)
Requesting: hydrocodone Contract:  UDS:03/31/20 Last Visit: 09/28/20 Next Visit: none Last Refill:   Please Advise

## 2021-01-20 ENCOUNTER — Telehealth: Payer: Self-pay | Admitting: *Deleted

## 2021-01-20 NOTE — Telephone Encounter (Signed)
Prior auth started via cover my meds. Awaiting determination.  Key: [id number]

## 2021-01-21 NOTE — Telephone Encounter (Signed)
Approved  01/20/2021 - 07/23/2021

## 2021-02-02 ENCOUNTER — Other Ambulatory Visit: Payer: Self-pay | Admitting: Family Medicine

## 2021-02-04 ENCOUNTER — Other Ambulatory Visit: Payer: Self-pay

## 2021-02-04 ENCOUNTER — Ambulatory Visit: Payer: BC Managed Care – PPO | Admitting: Primary Care

## 2021-02-04 ENCOUNTER — Encounter: Payer: Self-pay | Admitting: Primary Care

## 2021-02-04 ENCOUNTER — Ambulatory Visit (INDEPENDENT_AMBULATORY_CARE_PROVIDER_SITE_OTHER): Payer: BC Managed Care – PPO | Admitting: Internal Medicine

## 2021-02-04 DIAGNOSIS — J479 Bronchiectasis, uncomplicated: Secondary | ICD-10-CM

## 2021-02-04 LAB — PULMONARY FUNCTION TEST
DL/VA % pred: 100 %
DL/VA: 4.3 ml/min/mmHg/L
DLCO cor % pred: 65 %
DLCO cor: 11.87 ml/min/mmHg
DLCO unc % pred: 65 %
DLCO unc: 11.87 ml/min/mmHg
FEF 25-75 Post: 1.3 L/sec
FEF 25-75 Pre: 1.1 L/sec
FEF2575-%Change-Post: 18 %
FEF2575-%Pred-Post: 72 %
FEF2575-%Pred-Pre: 61 %
FEV1-%Change-Post: 4 %
FEV1-%Pred-Post: 82 %
FEV1-%Pred-Pre: 78 %
FEV1-Post: 1.46 L
FEV1-Pre: 1.4 L
FEV1FVC-%Change-Post: 6 %
FEV1FVC-%Pred-Pre: 97 %
FEV6-%Change-Post: -1 %
FEV6-%Pred-Post: 82 %
FEV6-%Pred-Pre: 83 %
FEV6-Post: 1.8 L
FEV6-Pre: 1.83 L
FEV6FVC-%Pred-Post: 104 %
FEV6FVC-%Pred-Pre: 104 %
FVC-%Change-Post: -1 %
FVC-%Pred-Post: 78 %
FVC-%Pred-Pre: 80 %
FVC-Post: 1.8 L
FVC-Pre: 1.83 L
Post FEV1/FVC ratio: 81 %
Post FEV6/FVC ratio: 100 %
Pre FEV1/FVC ratio: 77 %
Pre FEV6/FVC Ratio: 100 %
RV % pred: 96 %
RV: 1.81 L
TLC % pred: 79 %
TLC: 3.64 L

## 2021-02-04 NOTE — Assessment & Plan Note (Addendum)
-   Cough and sputum production have improved. She is not consistently performing pulmonary hygiene regimen. Pulmonary function testing today showed mild restrictive defect with moderate diffusion defect. Encourage patient try and provide sputum sample when able and use hypertonic saline nebulizer + flutter valve twice a day as often as possible. FU in 3-4 months with Dr. Celine Mans.

## 2021-02-04 NOTE — Progress Notes (Signed)
PFT done today. 

## 2021-02-04 NOTE — Progress Notes (Signed)
@Patient  ID: Jamie Cox, female    DOB: Jun 29, 1960, 60 y.o.   MRN: [id number]  Chief Complaint  Patient presents with   Follow-up    PFT     Referring provider: [name], MD  HPI: 60 year old female, never smoked. PMH significant for rheumatoid lung diease, bronchiectasis, asthma, chronic sinusitis, covid-19, GERD, type 2 diabetes, osteoporosis. Patient of Dr. Celine Mans, last seen on 11/10/20.   Previous [facility] pulmonary encounter:  Interval Updates: Here for follow up after CT Chest which shows persistent bronchiectasis and mucus plugging concerning for atypical infection. She has not brought in any sputum samples yet.    She is feeling fatigued, but no fevers, chills, night sweats or weight loss. Appetite is ok, no nausea, vomiting.    She has not started nebulized saline yet but does have a solution at home. Minimal albuterol use.  She has stopped symbicort, noticed no difference   I have reviewed the patient's family social and past medical history and updated as appropriate.     02/04/2021- INTERIM HX  Patient presents today for fu with PFTs. Her main complaints are cough and fatigue. Her legs get tired/heavy after working long hours. Her cough has improved, she is not coughing up as much phelgm. She is not consistently using hypertonic saline nebulizer or flutter vavlve. She states that work and home issues prevent her from adherence to pulmonary hygiene regimen. She is caring for 62 year old and working 8 hours at facilities operations at [employer]. She has not yet been able to provide a sputum sample. She follows with Dr. Azucena Fallen annually for rheumatoid arthritis.  Pulmonary function testing: 08/13/20 Spirometry- FVC 1.69 (78%), FEV1 1.24 (73%), ratio 73  02/04/2021 PFT- FVC 1.80 (78%), FEV1 1.46 (82%), ratio 81, TLC 79%, DLCOunc 11.87 (65%) Mild restrictive defect without BD response. Moderate diffusion defect.   Allergies  Allergen Reactions   Avelox [Moxifloxacin Hcl  In Nacl] Diarrhea, Nausea And Vomiting and Other (See Comments)    Dizziness, Cold chills    Bactrim [Sulfamethoxazole-Trimethoprim] Hives   Doxycycline     Abdominal discomfort, anorexia   Sulfamethoxazole-Trimethoprim Hives   Augmentin [Amoxicillin-Pot Clavulanate] Hives, Itching and Swelling    Immunization History  Administered Date(s) Administered   Influenza Split 04/05/2011   Influenza Whole 04/10/2008, 05/15/2009, 05/21/2010, 02/05/2012   Influenza,inj,Quad PF,6+ Mos 08/06/2013, 05/09/2016, 04/10/2018   Janssen (J&J) SARS-COV-2 Vaccination 08/17/2019   PFIZER Comirnaty(Gray Top)Covid-19 Tri-Sucrose Vaccine 10/27/2020   PNEUMOCOCCAL CONJUGATE-20 09/28/2020   Pneumococcal Polysaccharide-23 07/01/2010   Td 05/15/2009    Past Medical History:  Diagnosis Date   Allergy    Asthma    Atypical chest pain    Chronic sinusitis    Followed by Dr. Jenne Pane   Environmental allergies    GERD (gastroesophageal reflux disease)    High serum parathyroid hormone (PTH) 06/09/2016   History of chicken pox    Hypercalcemia 07/19/2015   Osteoporosis, unspecified    steroid induced   Personal history of other diseases of circulatory system    Rheumatoid arthritis(714.0)    Unspecified deficiency anemia    microcytic   Urticaria     Tobacco History: Social History   Tobacco Use  Smoking Status Never  Smokeless Tobacco Never   Counseling given: Not Answered   Outpatient Medications Prior to Visit  Medication Sig Dispense Refill   albuterol (VENTOLIN HFA) 108 (90 Base) MCG/ACT inhaler INHALE ONE TO TWO PUFFS INTO THE LUNGS EVERY 6 HOURS AS NEEDED  FOR WHEEZING OR SHORTNESS OF BREATH 18 g 5   atorvastatin (LIPITOR) 10 MG tablet Take 1 tablet by mouth once daily 90 tablet 1   COVID-19 mRNA Vac-TriS, Pfizer, (PFIZER-BIONT COVID-19 VAC-TRIS) SUSP injection Inject into the muscle. 0.3 mL 0   famotidine (PEPCID) 40 MG tablet TAKE 1 TABLET BY MOUTH EVERY DAY 90 tablet 1   fexofenadine  (ALLEGRA) 180 MG tablet Take 180 mg by mouth daily.     Fluticasone Propionate (XHANCE) 93 MCG/ACT EXHU Place 2 sprays into both nostrils in the morning and at bedtime. 16 mL 12   gabapentin (NEURONTIN) 300 MG capsule Take 1 capsule (300 mg total) by mouth 3 (three) times daily. 270 capsule 1   HEMATINIC/FOLIC ACID 324-1 MG TABS TAKE 1 TABLET BY MOUTH EVERY DAY 90 tablet 1   HYDROcodone-acetaminophen (NORCO/VICODIN) 5-325 MG tablet Take 1 tablet by mouth every 6 (six) hours as needed for moderate pain or severe pain. 40 tablet 0   levocetirizine (XYZAL) 5 MG tablet Take 1 tablet (5 mg total) by mouth every evening. 30 tablet 0   meloxicam (MOBIC) 7.5 MG tablet TAKE 1 TO 2 TABLETS BY MOUTH ONCE DAILY AS NEEDED FOR PAIN 30 tablet 7   metoprolol succinate (TOPROL-XL) 25 MG 24 hr tablet Take 1 tablet by mouth once daily 90 tablet 0   montelukast (SINGULAIR) 10 MG tablet TAKE 1 TABLET BY MOUTH AT BEDTIME 30 tablet 5   pantoprazole (PROTONIX) 40 MG tablet Take 1 tablet by mouth once daily 90 tablet 0   predniSONE (DELTASONE) 2.5 MG tablet Take 1 tablet by mouth once daily with breakfast 30 tablet 2   sodium chloride 0.9 % nebulizer solution Take 3 mLs by nebulization in the morning and at bedtime. 90 mL 12   No facility-administered medications prior to visit.      Review of Systems  Review of Systems  Constitutional:  Positive for fatigue.  HENT: Negative.    Respiratory:  Positive for cough. Negative for chest tightness, shortness of breath and wheezing.   Cardiovascular: Negative.     Physical Exam  BP 118/76 (BP Location: Left Arm, Patient Position: Sitting, Cuff Size: Normal)   Pulse 92   Temp 98.3 F (36.8 C) (Oral)   Ht 5' 0.8" (1.544 m)   Wt 113 lb 12.8 oz (51.6 kg)   SpO2 99%   BMI 21.64 kg/m  Physical Exam Constitutional:      Appearance: Normal appearance.  HENT:     Mouth/Throat:     Mouth: Mucous membranes are moist.     Pharynx: Oropharynx is clear.   Cardiovascular:     Rate and Rhythm: Normal rate and regular rhythm.  Pulmonary:     Effort: Pulmonary effort is normal.     Breath sounds: Normal breath sounds. No wheezing, rhonchi or rales.  Musculoskeletal:        General: Normal range of motion.  Skin:    General: Skin is warm and dry.  Neurological:     General: No focal deficit present.     Mental Status: She is alert and oriented to person, place, and time. Mental status is at baseline.  Psychiatric:        Mood and Affect: Mood normal.        Behavior: Behavior normal.        Thought Content: Thought content normal.     Lab Results:  CBC    Component Value Date/Time   WBC 6.6 09/28/2020 1539  RBC 4.35 09/28/2020 1539   HGB 10.1 (L) 09/28/2020 1539   HGB 10.4 (L) 08/18/2020 1415   HCT 32.0 (L) 09/28/2020 1539   HCT 34.4 08/18/2020 1415   PLT 190.0 09/28/2020 1539   MCV 73.5 (L) 09/28/2020 1539   MCV 76 (L) 08/18/2020 1415   MCH 22.9 (L) 08/18/2020 1415   MCH 22.7 (L) 03/31/2020 1108   MCHC 31.5 09/28/2020 1539   RDW 15.3 09/28/2020 1539   RDW 16.2 (H) 08/18/2020 1415   LYMPHSABS 0.9 09/28/2020 1539   LYMPHSABS 1.2 08/18/2020 1415   MONOABS 0.6 09/28/2020 1539   EOSABS 0.0 09/28/2020 1539   EOSABS 0.1 08/18/2020 1415   BASOSABS 0.1 09/28/2020 1539   BASOSABS 0.0 08/18/2020 1415    BMET    Component Value Date/Time   NA 141 09/28/2020 1539   K 4.0 09/28/2020 1539   CL 106 09/28/2020 1539   CO2 29 09/28/2020 1539   GLUCOSE 87 09/28/2020 1539   BUN 10 09/28/2020 1539   CREATININE 0.84 09/28/2020 1539   CREATININE 0.71 03/31/2020 1108   CALCIUM 9.7 09/28/2020 1539   GFRNONAA 32 (L) 09/21/2019 0330   GFRNONAA >89 07/22/2013 1155   GFRAA 37 (L) 09/21/2019 0330   GFRAA >89 07/22/2013 1155    BNP No results found for: BNP  ProBNP No results found for: PROBNP  Imaging: No results found.   Assessment & Plan:   No problem-specific Assessment & Plan notes found for this  encounter.     Glenford Bayley, NP 02/04/2021

## 2021-02-04 NOTE — Patient Instructions (Addendum)
Breathing test today showed mild-moderate decrease in lung function  Recommendations: Please complete sputum sample when able Use hypertonic saline nebulizer twice a day followed by flutter valve Get regular exercise Notify office if you develop increased or purulent mucus   Follow-up: 3-4 months with Dr. Celine Mans or sooner if needed    Bronchiectasis Bronchiectasis is a condition in which the airways in the lungs (bronchi) are damaged and widened. The condition makes it hard for the lungs to get rid of mucus, and it causes mucus to gather in the bronchi. This condition often leads to lung infections, which can make the condition worse. What are the causes? You can be born with this condition or you can develop it later in life. Common causes of this condition include: Cystic fibrosis. Repeated lung infections, such as pneumonia or tuberculosis. An object or other blockage in the lungs. Breathing in fluid, food, or other objects (aspiration). A problem with the immune system and lung structure that is present at birth (congenital). Sometimes the cause is not known. What are the signs or symptoms? Common symptoms of this condition include: A daily cough that brings up mucus and lasts for more than 3 weeks. Lung infections that happen often. Shortness of breath and wheezing. Weakness and fatigue. How is this diagnosed? This condition is diagnosed with tests, such as: Chest X-rays or CT scans. These are done to check for changes in the lungs. Breathing tests. These are done to check how well your lungs are working. A test of a sample of your saliva (sputum culture). This test is done to check for infection. Blood tests and other tests. These are done to check for related diseases or causes. How is this treated? Treatment for this condition depends on the severity of the illness and its cause. Treatment may include: Medicines that loosen mucus so it can be coughed up  (mucolytics). Medicines that relax the muscles of the bronchi (bronchodilators). Antibiotic medicines to prevent or treat infection. Physical therapy to help clear mucus from the lungs. Techniques may include: Postural drainage. This is when you sit or lie in certain positions so that mucus can drain by gravity. Chest percussion. This involves tapping the chest or back with a cupped hand. Chest vibration. For this therapy, a hand or special equipment vibrates your chest and back. Surgery to remove the affected part of the lung. This may be done in severe cases. Follow these instructions at home: Medicines Take over-the-counter and prescription medicines only as told by your health care provider. If you were prescribed an antibiotic medicine, take it as told by your health care provider. Do not stop taking the antibiotic even if you start to feel better. Avoid taking sedatives and antihistamines unless your health care provider tells you to take them. These medicines tend to thicken the mucus in the lungs. Managing symptoms Perform breathing exercises or techniques to clear your lungs as told by your health care provider. Consider using a cold steam vaporizer or humidifier in your room or home to help loosen secretions. If you have a cough that gets worse at night, try sleeping in a semi-upright position. General instructions Get plenty of rest. Drink enough fluid to keep your urine clear or pale yellow. Stay inside when pollution and ozone levels are high. Stay up to date with vaccinations and immunizations. Avoid cigarette smoke and other lung irritants. Do not use any products that contain nicotine or tobacco, such as cigarettes and e-cigarettes. If you need help quitting,  ask your health care provider. Keep all follow-up visits as told by your health care provider. This is important. Contact a health care provider if: You cough up more sputum than before and the sputum is yellow or green  in color. You have a fever. You cannot control your cough and are losing sleep. Get help right away if: You cough up blood. You have chest pain. You have increasing shortness of breath. You have pain that gets worse or is not controlled with medicines. You have a fever and your symptoms suddenly get worse. Summary Bronchiectasis is a condition in which the airways in the lungs (bronchi) are damaged and widened. The condition makes it hard for the lungs to get rid of mucus, and it causes mucus to gather in the bronchi. Treatment usually includes therapy to help clear mucus from the lungs. Avoid cigarette smoke and other lung irritants. Stay up to date with vaccinations and immunizations. This information is not intended to replace advice given to you by your health care provider. Make sure you discuss any questions you have with your health care provider. Document Revised: 01/23/2020 Document Reviewed: 01/23/2020 Elsevier Patient Education  2022 [employer].

## 2021-02-09 ENCOUNTER — Other Ambulatory Visit (HOSPITAL_BASED_OUTPATIENT_CLINIC_OR_DEPARTMENT_OTHER): Payer: Self-pay

## 2021-02-23 ENCOUNTER — Other Ambulatory Visit: Payer: Self-pay | Admitting: Family Medicine

## 2021-02-25 ENCOUNTER — Other Ambulatory Visit: Payer: Self-pay | Admitting: Family Medicine

## 2021-03-31 ENCOUNTER — Other Ambulatory Visit: Payer: Self-pay | Admitting: Family Medicine

## 2021-04-07 ENCOUNTER — Other Ambulatory Visit: Payer: Self-pay | Admitting: Family Medicine

## 2021-04-20 ENCOUNTER — Other Ambulatory Visit: Payer: Self-pay | Admitting: Family Medicine

## 2021-04-22 ENCOUNTER — Other Ambulatory Visit: Payer: Self-pay | Admitting: Family Medicine

## 2021-04-27 ENCOUNTER — Telehealth: Payer: Self-pay | Admitting: Family Medicine

## 2021-04-27 ENCOUNTER — Other Ambulatory Visit: Payer: Self-pay | Admitting: Family Medicine

## 2021-04-27 DIAGNOSIS — M069 Rheumatoid arthritis, unspecified: Secondary | ICD-10-CM

## 2021-04-27 MED ORDER — HYDROCODONE-ACETAMINOPHEN 5-325 MG PO TABS: 1.0000 | ORAL_TABLET | Freq: Four times a day (QID) | ORAL | 0 refills | Status: DC | PRN

## 2021-04-27 NOTE — Telephone Encounter (Signed)
Called lvm to schedule appointment

## 2021-04-27 NOTE — Telephone Encounter (Signed)
Medication: HYDROcodone-acetaminophen (NORCO/VICODIN) 5-325 MG tablet   Has the patient contacted their pharmacy? No. (If no, request that the patient contact the pharmacy for the refill.) (If yes, when and what did the pharmacy advise?)  Preferred Pharmacy (with phone number or street name):  [id number] - [place] ([place]), [place] - [address]  [address] [place] ([place]) [place] [address]  Phone:  [phone]  Fax:  [phone]   Agent: Please be advised that RX refills may take up to 3 business days. We ask that you follow-up with your pharmacy.

## 2021-04-27 NOTE — Progress Notes (Signed)
[facility] [address] [place], [place] [address] [phone]  Date:  04/28/2021   Name:  Jamie Cox   DOB:  12-22-1960   MRN:  [id number]  PCP:  [name], MD    Chief Complaint: No chief complaint on file.   History of Present Illness:  Jamie Cox is a 60 y.o. very pleasant female patient who presents with the following:  Virtual visit today for concern of medication follow-up Pt of Dr Abner Greenspan History of DM, asthma and rheumatoid lung disease, RA  Seen by pulmonology - last visit in September   Pt location is home, provider location is office. Patient identified with 2 factors, she gives consent for virtual visit today.  However, on discussion the patient is requesting a refill of her hydrocodone.  This was actually refilled yesterday by her PCP Dr. Abner Greenspan  Advised patient this has been taking care of already, she does not have any other concerns.  No charge for visit  Patient Active Problem List   Diagnosis Date Noted   Controlled type 2 diabetes mellitus without complication, without long-term current use of insulin (HCC) 09/28/2020   Chronic rhinitis 09/23/2020   Recurrent infections 09/23/2020   Bilateral impacted cerumen 08/03/2020   COVID-19 09/19/2019   Acute recurrent pansinusitis 05/29/2018   Atypical chest pain 08/09/2017   Onychomycosis 05/08/2017   Cough variant asthma with possible UACS component  05/05/2017   Asthma 02/13/2017   Allergy 08/03/2016   Maxillary sinusitis, chronic 08/03/2016   Bronchiectasis without acute exacerbation (HCC) 06/29/2016   High serum parathyroid hormone (PTH) 06/09/2016   Cervical cancer screening 03/04/2016   Hypercalcemia 07/19/2015   History of chicken pox    On prednisone therapy 09/25/2013   Intercostal muscle pain 09/20/2013   Preventative health care 08/06/2013   GERD (gastroesophageal reflux disease) 08/01/2012   Tachycardia  07/28/2012   Health care maintenance 01/31/2012   Chronic sinusitis 01/31/2012   High risk medication use 08/17/2011   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   Chronic cough 09/06/2010   Rheumatoid arthritis (HCC) 05/17/2007   Anemia 06/23/2006   Osteoporosis 06/23/2006    Past Medical History:  Diagnosis Date   Allergy    Asthma    Atypical chest pain    Chronic sinusitis    Followed by Dr. Jenne Pane   Environmental allergies    GERD (gastroesophageal reflux disease)    High serum parathyroid hormone (PTH) 06/09/2016   History of chicken pox    Hypercalcemia 07/19/2015   Osteoporosis, unspecified    steroid induced   Personal history of other diseases of circulatory system    Rheumatoid arthritis(714.0)    Unspecified deficiency anemia    microcytic   Urticaria     Past Surgical History:  Procedure Laterality Date   NASAL SINUS SURGERY  06/2012   NASAL SINUS SURGERY     2010    Social History   Tobacco Use   Smoking status: Never   Smokeless tobacco: Never  Vaping Use   Vaping Use: Never used  Substance Use Topics   Alcohol use: No    Alcohol/week: 0.0 standard drinks   Drug use: No    Family History  Problem Relation Age of Onset   Liver cancer Maternal Grandmother    Hypertension Maternal Grandmother    Heart attack Maternal Grandmother    Scleroderma Mother    Heart disease Mother  Kidney disease Mother    Other Mother    Prostate cancer Father    Diabetes Father    Heart disease Father        CHF   Arthritis Father        s/p hip replacement   Cancer Father        lung cancer with mets, smoker   Lung cancer Paternal Uncle    Coronary artery disease Paternal Grandmother    Hypertension Paternal Grandmother    Liver cancer Paternal Grandmother    Hyperlipidemia Son    Alzheimer's disease Paternal Grandfather    Other Sister        Cardiomegaly   Arthritis Sister    Heart disease Sister        rare   Sleep apnea Brother    Heart disease  Brother    Arthritis Daughter    Lupus Cousin    Arthritis Cousin    Colon cancer Neg Hx    Asthma Neg Hx    Allergic rhinitis Neg Hx    Eczema Neg Hx    Immunodeficiency Neg Hx    Angioedema Neg Hx     Allergies  Allergen Reactions   Avelox [Moxifloxacin Hcl In Nacl] Diarrhea, Nausea And Vomiting and Other (See Comments)    Dizziness, Cold chills    Bactrim [Sulfamethoxazole-Trimethoprim] Hives   Doxycycline     Abdominal discomfort, anorexia   Sulfamethoxazole-Trimethoprim Hives   Augmentin [Amoxicillin-Pot Clavulanate] Hives, Itching and Swelling    Medication list has been reviewed and updated.  Current Outpatient Medications on File Prior to Visit  Medication Sig Dispense Refill   metoprolol succinate (TOPROL-XL) 25 MG 24 hr tablet Take 1 tablet by mouth once daily 30 tablet 0   albuterol (VENTOLIN HFA) 108 (90 Base) MCG/ACT inhaler INHALE ONE TO TWO PUFFS INTO THE LUNGS EVERY 6 HOURS AS NEEDED FOR WHEEZING OR SHORTNESS OF BREATH 18 g 5   atorvastatin (LIPITOR) 10 MG tablet Take 1 tablet by mouth once daily 90 tablet 1   COVID-19 mRNA Vac-TriS, Pfizer, (PFIZER-BIONT COVID-19 VAC-TRIS) SUSP injection Inject into the muscle. 0.3 mL 0   famotidine (PEPCID) 40 MG tablet TAKE 1 TABLET BY MOUTH EVERY DAY 90 tablet 1   fexofenadine (ALLEGRA) 180 MG tablet Take 180 mg by mouth daily.     Fluticasone Propionate (XHANCE) 93 MCG/ACT EXHU Place 2 sprays into both nostrils in the morning and at bedtime. 16 mL 12   gabapentin (NEURONTIN) 300 MG capsule Take 1 capsule (300 mg total) by mouth 3 (three) times daily. 270 capsule 1   HEMATINIC/FOLIC ACID 324-1 MG TABS TAKE 1 TABLET BY MOUTH EVERY DAY 90 tablet 1   HYDROcodone-acetaminophen (NORCO/VICODIN) 5-325 MG tablet Take 1 tablet by mouth every 6 (six) hours as needed for moderate pain or severe pain. 40 tablet 0   levocetirizine (XYZAL) 5 MG tablet Take 1 tablet (5 mg total) by mouth every evening. 30 tablet 0   meloxicam (MOBIC) 7.5 MG  tablet TAKE 1 TO 2 TABLETS BY MOUTH ONCE DAILY AS NEEDED FOR PAIN 30 tablet 0   montelukast (SINGULAIR) 10 MG tablet TAKE 1 TABLET BY MOUTH AT BEDTIME 30 tablet 5   pantoprazole (PROTONIX) 40 MG tablet Take 1 tablet by mouth once daily 90 tablet 0   predniSONE (DELTASONE) 2.5 MG tablet Take 1 tablet by mouth once daily with breakfast 30 tablet 0   sodium chloride 0.9 % nebulizer solution Take 3 mLs by nebulization in the  morning and at bedtime. 90 mL 12   No current facility-administered medications on file prior to visit.    Review of Systems:  As per HPI- otherwise negative.   Physical Examination: There were no vitals filed for this visit. There were no vitals filed for this visit. There is no height or weight on file to calculate BMI. Ideal Body Weight:      Assessment and Plan:   Signed Abbe Amsterdam, MD

## 2021-04-28 ENCOUNTER — Other Ambulatory Visit: Payer: Self-pay

## 2021-04-28 ENCOUNTER — Telehealth: Payer: BC Managed Care – PPO | Admitting: Family Medicine

## 2021-04-28 DIAGNOSIS — Z760 Encounter for issue of repeat prescription: Secondary | ICD-10-CM

## 2021-05-08 ENCOUNTER — Other Ambulatory Visit: Payer: Self-pay | Admitting: Family Medicine

## 2021-05-10 ENCOUNTER — Other Ambulatory Visit: Payer: Self-pay | Admitting: Family Medicine

## 2021-05-13 ENCOUNTER — Other Ambulatory Visit: Payer: Self-pay | Admitting: Family Medicine

## 2021-05-14 ENCOUNTER — Other Ambulatory Visit: Payer: Self-pay | Admitting: Family Medicine

## 2021-05-25 ENCOUNTER — Other Ambulatory Visit: Payer: Self-pay | Admitting: Family Medicine

## 2021-05-26 ENCOUNTER — Other Ambulatory Visit: Payer: Self-pay | Admitting: Family Medicine

## 2021-06-08 ENCOUNTER — Telehealth: Payer: Self-pay

## 2021-06-08 ENCOUNTER — Ambulatory Visit: Payer: BC Managed Care – PPO | Admitting: Family Medicine

## 2021-06-08 NOTE — Telephone Encounter (Signed)
Prolia VOB initiated via [email or web address]  Last OV:  Next OV:  Last Prolia inj: 01/01/19 Next Prolia inj DUE: RE-START

## 2021-06-11 ENCOUNTER — Encounter: Payer: Self-pay | Admitting: Family Medicine

## 2021-06-11 ENCOUNTER — Ambulatory Visit: Payer: BC Managed Care – PPO | Attending: Internal Medicine

## 2021-06-11 ENCOUNTER — Ambulatory Visit: Payer: BC Managed Care – PPO | Admitting: Family Medicine

## 2021-06-11 VITALS — BP 112/62 | HR 64 | Temp 97.8°F | Resp 16 | Ht 61.0 in | Wt 114.6 lb

## 2021-06-11 DIAGNOSIS — R739 Hyperglycemia, unspecified: Secondary | ICD-10-CM

## 2021-06-11 DIAGNOSIS — M810 Age-related osteoporosis without current pathological fracture: Secondary | ICD-10-CM

## 2021-06-11 DIAGNOSIS — R000 Tachycardia, unspecified: Secondary | ICD-10-CM

## 2021-06-11 DIAGNOSIS — Z79899 Other long term (current) drug therapy: Secondary | ICD-10-CM

## 2021-06-11 DIAGNOSIS — D649 Anemia, unspecified: Secondary | ICD-10-CM | POA: Diagnosis not present

## 2021-06-11 DIAGNOSIS — Z79891 Long term (current) use of opiate analgesic: Secondary | ICD-10-CM

## 2021-06-11 DIAGNOSIS — E119 Type 2 diabetes mellitus without complications: Secondary | ICD-10-CM | POA: Diagnosis not present

## 2021-06-11 DIAGNOSIS — R053 Chronic cough: Secondary | ICD-10-CM

## 2021-06-11 DIAGNOSIS — Z23 Encounter for immunization: Secondary | ICD-10-CM

## 2021-06-11 LAB — CBC WITH DIFFERENTIAL/PLATELET
Basophils Absolute: 0 10*3/uL (ref 0.0–0.1)
Basophils Relative: 1.1 % (ref 0.0–3.0)
Eosinophils Absolute: 0.1 10*3/uL (ref 0.0–0.7)
Eosinophils Relative: 1.8 % (ref 0.0–5.0)
HCT: 37.3 % (ref 36.0–46.0)
Hemoglobin: 11.3 g/dL — ABNORMAL LOW (ref 12.0–15.0)
Lymphocytes Relative: 18.1 % (ref 12.0–46.0)
Lymphs Abs: 0.7 10*3/uL (ref 0.7–4.0)
MCHC: 30.3 g/dL (ref 30.0–36.0)
MCV: 73.9 fl — ABNORMAL LOW (ref 78.0–100.0)
Monocytes Absolute: 0.5 10*3/uL (ref 0.1–1.0)
Monocytes Relative: 11.1 % (ref 3.0–12.0)
Neutro Abs: 2.8 10*3/uL (ref 1.4–7.7)
Neutrophils Relative %: 67.9 % (ref 43.0–77.0)
Platelets: 169 10*3/uL (ref 150.0–400.0)
RBC: 5.04 Mil/uL (ref 3.87–5.11)
RDW: 15.1 % (ref 11.5–15.5)
WBC: 4.1 10*3/uL (ref 4.0–10.5)

## 2021-06-11 LAB — LIPID PANEL
Cholesterol: 191 mg/dL (ref 0–200)
HDL: 88.5 mg/dL (ref >39.00)
LDL Cholesterol: 90 mg/dL (ref 0–99)
NonHDL: 102.57
Total CHOL/HDL Ratio: 2
Triglycerides: 62 mg/dL (ref 0.0–149.0)
VLDL: 12.4 mg/dL (ref 0.0–40.0)

## 2021-06-11 LAB — COMPREHENSIVE METABOLIC PANEL
ALT: 13 U/L (ref 0–35)
AST: 18 U/L (ref 0–37)
Albumin: 4.2 g/dL (ref 3.5–5.2)
Alkaline Phosphatase: 64 U/L (ref 39–117)
BUN: 15 mg/dL (ref 6–23)
CO2: 26 mEq/L (ref 19–32)
Calcium: 10.3 mg/dL (ref 8.4–10.5)
Chloride: 104 mEq/L (ref 96–112)
Creatinine, Ser: 0.78 mg/dL (ref 0.40–1.20)
GFR: 81.22 mL/min (ref >60.00)
Glucose, Bld: 84 mg/dL (ref 70–99)
Potassium: 4.7 mEq/L (ref 3.5–5.1)
Sodium: 139 mEq/L (ref 135–145)
Total Bilirubin: 0.3 mg/dL (ref 0.2–1.2)
Total Protein: 8 g/dL (ref 6.0–8.3)

## 2021-06-11 LAB — HEMOGLOBIN A1C: Hgb A1c MFr Bld: 6.7 % — ABNORMAL HIGH (ref 4.6–6.5)

## 2021-06-11 LAB — TSH: TSH: 0.87 u[IU]/mL (ref 0.35–5.50)

## 2021-06-11 NOTE — Patient Instructions (Addendum)
GET SPUTUM CULTURE TO PULMONOLOGY ASAP    Cough, Adult Coughing is a reflex that clears your throat and your airways (respiratory system). Coughing helps to heal and protect your lungs. It is normal to cough occasionally, but a cough that happens with other symptoms or lasts a long time may be a sign of a condition that needs treatment. An acute cough may only last 2-3 weeks, while a chronic cough may last 8 or more weeks. Coughing is commonly caused by: Infection of the respiratory systemby viruses or bacteria. Breathing in substances that irritate your lungs. Allergies. Asthma. Mucus that runs down the back of your throat (postnasal drip). Smoking. Acid backing up from the stomach into the esophagus (gastroesophageal reflux). Certain medicines. Chronic lung problems. Other medical conditions such as heart failure or a blood clot in the lung (pulmonary embolism). Follow these instructions at home: Medicines Take over-the-counter and prescription medicines only as told by your health care provider. Talk with your health care provider before you take a cough suppressant medicine. Lifestyle  Avoid cigarette smoke. Do not use any products that contain nicotine or tobacco, such as cigarettes, e-cigarettes, and chewing tobacco. If you need help quitting, ask your health care provider. Drink enough fluid to keep your urine pale yellow. Avoid caffeine. Do not drink alcohol if your health care provider tells you not to drink. General instructions  Pay close attention to changes in your cough. Tell your health care provider about them. Always cover your mouth when you cough. Avoid things that make you cough, such as perfume, candles, cleaning products, or campfire or tobacco smoke. If the air is dry, use a cool mist vaporizer or humidifier in your bedroom or your home to help loosen secretions. If your cough is worse at night, try to sleep in a semi-upright position. Rest as needed. Keep  all follow-up visits as told by your health care provider. This is important. Contact a health care provider if you: Have new symptoms. Cough up pus. Have a cough that does not get better after 2-3 weeks or gets worse. Cannot control your cough with cough suppressant medicines and you are losing sleep. Have pain that gets worse or pain that is not helped with medicine. Have a fever. Have unexplained weight loss. Have night sweats. Get help right away if: You cough up blood. You have difficulty breathing. Your heartbeat is very fast. These symptoms may represent a serious problem that is an emergency. Do not wait to see if the symptoms will go away. Get medical help right away. Call your local emergency services (911 in the [place] Do not drive yourself to the hospital. Summary Coughing is a reflex that clears your throat and your airways. It is normal to cough occasionally, but a cough that happens with other symptoms or lasts a long time may be a sign of a condition that needs treatment. Take over-the-counter and prescription medicines only as told by your health care provider. Always cover your mouth when you cough. Contact a health care provider if you have new symptoms or a cough that does not get better after 2-3 weeks or gets worse. This information is not intended to replace advice given to you by your health care provider. Make sure you discuss any questions you have with your health care provider. Document Revised: 06/11/2018 Document Reviewed: 06/11/2018 Elsevier Patient Education  2022 [employer].

## 2021-06-11 NOTE — Progress Notes (Signed)
° °  Covid-19 Vaccination Clinic  Name:  Jamie Cox    MRN: [id number] DOB: November 11, 1960  06/11/2021  Jamie Cox was observed post Covid-19 immunization for 15 minutes without incident. She was provided with Vaccine Information Sheet and instruction to access the V-Safe system.   Jamie Cox was instructed to call 911 with any severe reactions post vaccine: Difficulty breathing  Swelling of face and throat  A fast heartbeat  A bad rash all over body  Dizziness and weakness   Immunizations Administered     Name Date Dose VIS Date Route   Pfizer Covid-19 Vaccine Bivalent Booster 06/11/2021 12:10 PM 0.3 mL 02/03/2021 Intramuscular   Manufacturer: [employer], [employer]   Lot: [id number]   NDC: [phone]

## 2021-06-12 NOTE — Telephone Encounter (Signed)
Prior Auth required for Prolia  AUTHORIZATION REQUIRED: Yes PA PROCESS DETAILS: PA is required. Providers may call Medical Utilization at [phone] to initiate. Forms may be accessed online at [email or web address]

## 2021-06-13 NOTE — Assessment & Plan Note (Signed)
RRR today 

## 2021-06-13 NOTE — Assessment & Plan Note (Signed)
Stable, she is established with pulmonology now but has not returned the sputum collection they asked for. She agrees to proceed so they an complete their work up.

## 2021-06-13 NOTE — Assessment & Plan Note (Signed)
hgba1c acceptable, minimize simple carbs. Increase exercise as tolerated. Continue current meds 

## 2021-06-13 NOTE — Assessment & Plan Note (Signed)
Encouraged to get adequate exercise, calcium and vitamin d intake 

## 2021-06-13 NOTE — Progress Notes (Signed)
Subjective:    Patient ID: Jamie Cox, female    DOB: 05/18/1961, 61 y.o.   MRN: [id number]  Chief Complaint  Patient presents with   Follow-up    HPI Patient is in today for follow up on chronic medical concerns. No recent febrile illness or hospitalizations. Her cough persists and she is established with pulmonology now. She continues to work and help care for her grandchildren. She reports she is eating well despite being due for a root canal soon. Denies CP/palp/SOB/HA/congestion/fevers/GI or GU c/o. Taking meds as prescribed   Past Medical History:  Diagnosis Date   Allergy    Asthma    Atypical chest pain    Chronic sinusitis    Followed by Dr. Jenne PaneBates   Environmental allergies    GERD (gastroesophageal reflux disease)    High serum parathyroid hormone (PTH) 06/09/2016   History of chicken pox    Hypercalcemia 07/19/2015   Osteoporosis, unspecified    steroid induced   Personal history of other diseases of circulatory system    Rheumatoid arthritis(714.0)    Unspecified deficiency anemia    microcytic   Urticaria     Past Surgical History:  Procedure Laterality Date   NASAL SINUS SURGERY  06/2012   NASAL SINUS SURGERY     2010    Family History  Problem Relation Age of Onset   Liver cancer Maternal Grandmother    Hypertension Maternal Grandmother    Heart attack Maternal Grandmother    Scleroderma Mother    Heart disease Mother    Kidney disease Mother    Other Mother    Prostate cancer Father    Diabetes Father    Heart disease Father        CHF   Arthritis Father        s/p hip replacement   Cancer Father        lung cancer with mets, smoker   Lung cancer Paternal Uncle    Coronary artery disease Paternal Grandmother    Hypertension Paternal Grandmother    Liver cancer Paternal Grandmother    Hyperlipidemia Son    Alzheimer's disease Paternal Grandfather    Other Sister        Cardiomegaly   Arthritis Sister    Heart disease Sister         rare   Sleep apnea Brother    Heart disease Brother    Arthritis Daughter    Lupus Cousin    Arthritis Cousin    Colon cancer Neg Hx    Asthma Neg Hx    Allergic rhinitis Neg Hx    Eczema Neg Hx    Immunodeficiency Neg Hx    Angioedema Neg Hx     Social History   Socioeconomic History   Marital status: Married    Spouse name: Not on file   Number of children: 2   Years of education: Not on file   Highest education level: Not on file  Occupational History   Occupation: Data processing managerAssistant teacher  Tobacco Use   Smoking status: Never   Smokeless tobacco: Never  Vaping Use   Vaping Use: Never used  Substance and Sexual Activity   Alcohol use: No    Alcohol/week: 0.0 standard drinks   Drug use: No   Sexual activity: Not Currently  Other Topics Concern   Not on file  Social History Narrative   Not on file   Social Determinants of Health   Financial Resource Strain: Not  on file  Food Insecurity: Not on file  Transportation Needs: Not on file  Physical Activity: Not on file  Stress: Not on file  Social Connections: Not on file  Intimate Partner Violence: Not on file    Outpatient Medications Prior to Visit  Medication Sig Dispense Refill   albuterol (VENTOLIN HFA) 108 (90 Base) MCG/ACT inhaler INHALE ONE TO TWO PUFFS INTO THE LUNGS EVERY 6 HOURS AS NEEDED FOR WHEEZING OR SHORTNESS OF BREATH 18 g 5   atorvastatin (LIPITOR) 10 MG tablet Take 1 tablet by mouth once daily 90 tablet 1   COVID-19 mRNA Vac-TriS, Pfizer, (PFIZER-BIONT COVID-19 VAC-TRIS) SUSP injection Inject into the muscle. 0.3 mL 0   famotidine (PEPCID) 40 MG tablet TAKE 1 TABLET BY MOUTH EVERY DAY 90 tablet 1   fexofenadine (ALLEGRA) 180 MG tablet Take 180 mg by mouth daily.     Fluticasone Propionate (XHANCE) 93 MCG/ACT EXHU Place 2 sprays into both nostrils in the morning and at bedtime. 16 mL 12   gabapentin (NEURONTIN) 300 MG capsule TAKE 1 CAPSULE BY MOUTH THREE TIMES DAILY 270 capsule 0   HEMATINIC/FOLIC  ACID 324-1 MG TABS TAKE 1 TABLET BY MOUTH EVERY DAY 90 tablet 1   HYDROcodone-acetaminophen (NORCO/VICODIN) 5-325 MG tablet Take 1 tablet by mouth every 6 (six) hours as needed for moderate pain or severe pain. 40 tablet 0   levocetirizine (XYZAL) 5 MG tablet Take 1 tablet (5 mg total) by mouth every evening. 30 tablet 0   meloxicam (MOBIC) 7.5 MG tablet TAKE 1 TO 2 TABLETS BY MOUTH ONCE DAILY AS NEEDED FOR PAIN 30 tablet 0   metoprolol succinate (TOPROL-XL) 25 MG 24 hr tablet Take 1 tablet by mouth once daily 30 tablet 0   montelukast (SINGULAIR) 10 MG tablet TAKE 1 TABLET BY MOUTH AT BEDTIME 30 tablet 5   pantoprazole (PROTONIX) 40 MG tablet Take 1 tablet by mouth once daily 90 tablet 0   predniSONE (DELTASONE) 2.5 MG tablet Take 1 tablet by mouth once daily with breakfast 30 tablet 0   sodium chloride 0.9 % nebulizer solution Take 3 mLs by nebulization in the morning and at bedtime. 90 mL 12   No facility-administered medications prior to visit.    Allergies  Allergen Reactions   Avelox [Moxifloxacin Hcl In Nacl] Diarrhea, Nausea And Vomiting and Other (See Comments)    Dizziness, Cold chills    Bactrim [Sulfamethoxazole-Trimethoprim] Hives   Doxycycline     Abdominal discomfort, anorexia   Sulfamethoxazole-Trimethoprim Hives   Augmentin [Amoxicillin-Pot Clavulanate] Hives, Itching and Swelling    Review of Systems  Constitutional:  Positive for malaise/fatigue. Negative for fever.  HENT:  Negative for congestion.   Eyes:  Negative for blurred vision.  Respiratory:  Positive for cough and shortness of breath. Negative for hemoptysis, sputum production and wheezing.   Cardiovascular:  Negative for chest pain, palpitations and leg swelling.  Gastrointestinal:  Negative for abdominal pain, blood in stool and nausea.  Genitourinary:  Negative for dysuria and frequency.  Musculoskeletal:  Negative for falls.  Skin:  Negative for rash.  Neurological:  Negative for dizziness, loss of  consciousness and headaches.  Endo/Heme/Allergies:  Negative for environmental allergies.  Psychiatric/Behavioral:  Negative for depression. The patient is not nervous/anxious.       Objective:    Physical Exam Constitutional:      General: She is not in acute distress.    Appearance: She is well-developed.  HENT:     Head: Normocephalic and  atraumatic.  Eyes:     Conjunctiva/sclera: Conjunctivae normal.  Neck:     Thyroid: No thyromegaly.  Cardiovascular:     Rate and Rhythm: Normal rate and regular rhythm.     Heart sounds: Normal heart sounds. No murmur heard. Pulmonary:     Effort: Pulmonary effort is normal. No respiratory distress.     Breath sounds: Normal breath sounds.  Abdominal:     General: Bowel sounds are normal. There is no distension.     Palpations: Abdomen is soft. There is no mass.     Tenderness: There is no abdominal tenderness.  Musculoskeletal:     Cervical back: Neck supple.  Lymphadenopathy:     Cervical: No cervical adenopathy.  Skin:    General: Skin is warm and dry.  Neurological:     Mental Status: She is alert and oriented to person, place, and time.  Psychiatric:        Behavior: Behavior normal.    BP 112/62    Pulse 64    Temp 97.8 F (36.6 C)    Resp 16    Ht  (1.549 m)    Wt 114 lb 9.6 oz (52 kg)    SpO2 99%    BMI 21.65 kg/m  Wt Readings from Last 3 Encounters:  06/11/21 114 lb 9.6 oz (52 kg)  02/04/21 113 lb 12.8 oz (51.6 kg)  11/10/20 112 lb (50.8 kg)    Diabetic Foot Exam - Simple   No data filed    Lab Results  Component Value Date   WBC 4.1 06/11/2021   HGB 11.3 (L) 06/11/2021   HCT 37.3 06/11/2021   PLT 169.0 06/11/2021   GLUCOSE 84 06/11/2021   CHOL 191 06/11/2021   TRIG 62.0 06/11/2021   HDL 88.50 06/11/2021   LDLCALC 90 06/11/2021   ALT 13 06/11/2021   AST 18 06/11/2021   NA 139 06/11/2021   K 4.7 06/11/2021   CL 104 06/11/2021   CREATININE 0.78 06/11/2021   BUN 15 06/11/2021   CO2 26 06/11/2021    TSH 0.87 06/11/2021   HGBA1C 6.7 (H) 06/11/2021    Lab Results  Component Value Date   TSH 0.87 06/11/2021   Lab Results  Component Value Date   WBC 4.1 06/11/2021   HGB 11.3 (L) 06/11/2021   HCT 37.3 06/11/2021   MCV 73.9 (L) 06/11/2021   PLT 169.0 06/11/2021   Lab Results  Component Value Date   NA 139 06/11/2021   K 4.7 06/11/2021   CO2 26 06/11/2021   GLUCOSE 84 06/11/2021   BUN 15 06/11/2021   CREATININE 0.78 06/11/2021   BILITOT 0.3 06/11/2021   ALKPHOS 64 06/11/2021   AST 18 06/11/2021   ALT 13 06/11/2021   PROT 8.0 06/11/2021   ALBUMIN 4.2 06/11/2021   CALCIUM 10.3 06/11/2021   ANIONGAP 14 09/21/2019   GFR 81.22 06/11/2021   Lab Results  Component Value Date   CHOL 191 06/11/2021   Lab Results  Component Value Date   HDL 88.50 06/11/2021   Lab Results  Component Value Date   LDLCALC 90 06/11/2021   Lab Results  Component Value Date   TRIG 62.0 06/11/2021   Lab Results  Component Value Date   CHOLHDL 2 06/11/2021   Lab Results  Component Value Date   HGBA1C 6.7 (H) 06/11/2021       Assessment & Plan:   Problem List Items Addressed This Visit     Anemia -  Primary   Relevant Orders   CBC with Differential/Platelet (Completed)   Iron, TIBC and Ferritin Panel (Completed)   Osteoporosis    Encouraged to get adequate exercise, calcium and vitamin d intake      Chronic cough    Stable, she is established with pulmonology now but has not returned the sputum collection they asked for. She agrees to proceed so they an complete their work up.      Tachycardia    RRR today      High risk medication use   Relevant Orders   Drug Monitoring Panel [phone] , Urine   Controlled type 2 diabetes mellitus without complication, without long-term current use of insulin (HCC)    hgba1c acceptable, minimize simple carbs. Increase exercise as tolerated. Continue current meds      Relevant Orders   Hemoglobin A1c (Completed)   CBC with  Differential/Platelet (Completed)   Comprehensive metabolic panel (Completed)   TSH (Completed)   Lipid panel (Completed)   Other Visit Diagnoses     Opioid use agreement exists       Relevant Orders   Drug Monitoring Panel [phone] , Urine   Hyperglycemia       Relevant Orders   Hemoglobin A1c (Completed)   CBC with Differential/Platelet (Completed)   Comprehensive metabolic panel (Completed)   TSH (Completed)   Lipid panel (Completed)       I am having Cyleigh L. Nairn maintain her levocetirizine, albuterol, pantoprazole, fexofenadine, Xhance, sodium chloride, Pfizer-BioNT COVID-19 Vac-TriS, montelukast, Hematinic/Folic Acid, atorvastatin, famotidine, HYDROcodone-acetaminophen, gabapentin, predniSONE, meloxicam, and metoprolol succinate.  No orders of the defined types were placed in this encounter.    Danise Edge, MD

## 2021-06-14 ENCOUNTER — Other Ambulatory Visit (HOSPITAL_BASED_OUTPATIENT_CLINIC_OR_DEPARTMENT_OTHER): Payer: Self-pay

## 2021-06-14 MED ORDER — PFIZER COVID-19 VAC BIVALENT 30 MCG/0.3ML IM SUSP
INTRAMUSCULAR | 0 refills | Status: DC
  Filled 2021-06-14: qty 0.3, 1d supply, fill #0

## 2021-06-16 ENCOUNTER — Other Ambulatory Visit: Payer: Self-pay | Admitting: Family Medicine

## 2021-06-17 LAB — DRUG MONITORING PANEL 376104, URINE
Amphetamines: NEGATIVE ng/mL (ref <500)
Barbiturates: NEGATIVE ng/mL (ref <300)
Benzodiazepines: NEGATIVE ng/mL (ref <100)
Cocaine Metabolite: NEGATIVE ng/mL (ref <150)
Codeine: NEGATIVE ng/mL (ref <50)
Desmethyltramadol: NEGATIVE ng/mL (ref <100)
Hydrocodone: NEGATIVE ng/mL (ref <50)
Hydromorphone: 82 ng/mL — ABNORMAL HIGH (ref <50)
Morphine: NEGATIVE ng/mL (ref <50)
Norhydrocodone: NEGATIVE ng/mL (ref <50)
Opiates: POSITIVE ng/mL — AB (ref <100)
Oxycodone: NEGATIVE ng/mL (ref <100)
Tramadol: NEGATIVE ng/mL (ref <100)

## 2021-06-17 LAB — DM TEMPLATE

## 2021-06-17 LAB — IRON,TIBC AND FERRITIN PANEL
%SAT: 37 % (calc) (ref 16–45)
Ferritin: 558 ng/mL — ABNORMAL HIGH (ref 16–288)
Iron: 110 ug/dL (ref 45–160)
TIBC: 299 mcg/dL (calc) (ref 250–450)

## 2021-06-26 NOTE — Telephone Encounter (Signed)
Prior auth initiated via [email or web address] KEY: [id number]

## 2021-06-29 ENCOUNTER — Other Ambulatory Visit: Payer: Self-pay | Admitting: Family Medicine

## 2021-06-29 NOTE — Telephone Encounter (Signed)
Prior auth APPROVED ° ° ° ° °

## 2021-06-29 NOTE — Telephone Encounter (Signed)
Pt ready for scheduling on or after 06/29/21  Out-of-pocket cost due at time of visit: $0  Primary: BCBS [place] Prolia co-insurance: 0% Admin fee co-insurance: 0%  Secondary: n/a Prolia co-insurance:  Admin fee co-insurance:   Deductible: does not apply  Prior Auth: APPROVED PA# Key: [id number] Valid: 06/17/21-06/25/22    ** This summary of benefits is an estimation of the patient's out-of-pocket cost. Exact cost may very based on individual plan coverage.

## 2021-06-30 NOTE — Telephone Encounter (Signed)
Pt ready for scheduling on or after 06/29/21 (right now)   Out-of-pocket cost due at time of visit: $0

## 2021-06-30 NOTE — Telephone Encounter (Signed)
Pt scheduled  

## 2021-07-02 ENCOUNTER — Encounter: Payer: Self-pay | Admitting: Family Medicine

## 2021-07-02 ENCOUNTER — Ambulatory Visit: Payer: BC Managed Care – PPO | Admitting: Family Medicine

## 2021-07-02 VITALS — BP 121/83 | HR 92 | Temp 98.1°F | Ht 61.0 in | Wt 112.2 lb

## 2021-07-02 DIAGNOSIS — M79661 Pain in right lower leg: Secondary | ICD-10-CM

## 2021-07-02 MED ORDER — MELOXICAM 7.5 MG PO TABS: ORAL_TABLET | ORAL | 0 refills | Status: DC

## 2021-07-02 MED ORDER — TIZANIDINE HCL 4 MG PO TABS: 4.0000 mg | ORAL_TABLET | Freq: Four times a day (QID) | ORAL | 0 refills | Status: DC | PRN

## 2021-07-02 NOTE — Progress Notes (Signed)
Musculoskeletal Exam  Patient: Jamie Cox DOB: 04/02/1961  DOS: 07/02/2021  SUBJECTIVE:  Chief Complaint:   Chief Complaint  Patient presents with   Leg Pain    Right for one week    Jamie DuraCynthia L Vanorder is a 61 y.o.  female for evaluation and treatment of R leg pain.   Onset:  1 week ago. No inj or change in activity.  Location: lateral calf into lateral foot Character:  aching  Progression of issue:  is unchanged Associated symptoms: difficulty walking No swelling, redness, bruising, prolonged bed rest, recent surgeries, decreased ROm Treatment: to date has been acetaminophen and hydrocodone.   Neurovascular symptoms: no  Past Medical History:  Diagnosis Date   Allergy    Asthma    Atypical chest pain    Chronic sinusitis    Followed by Dr. Jenne PaneBates   Environmental allergies    GERD (gastroesophageal reflux disease)    High serum parathyroid hormone (PTH) 06/09/2016   History of chicken pox    Hypercalcemia 07/19/2015   Osteoporosis, unspecified    steroid induced   Personal history of other diseases of circulatory system    Rheumatoid arthritis(714.0)    Unspecified deficiency anemia    microcytic   Urticaria     Objective: VITAL SIGNS: BP 121/83    Pulse 92    Temp 98.1 F (36.7 C) (Oral)    Ht 5\' 1"  (1.549 m)    Wt 112 lb 4 oz (50.9 kg)    SpO2 95%    BMI 21.21 kg/m  Constitutional: Well formed, well developed. No acute distress. Thorax & Lungs: No accessory muscle use Musculoskeletal: RLE.   Normal active range of motion of ankle: yes.   Normal passive range of motion: yes Tenderness to palpation: yes; over lateral leg compartment and calf Deformity: no Ecchymosis: no Tests positive: none Tests negative: squeeze Neurologic: Normal sensory function. No focal deficits noted. Psychiatric: Normal mood. Age appropriate judgment and insight. Alert & oriented x 3.    Assessment:  Pain of right lower leg - Plan: meloxicam (MOBIC) 7.5 MG tablet, tiZANidine  (ZANAFLEX) 4 MG tablet  Plan: New problem. Meloxicam, stretches/exercises, heat, ice, Tylenol trial muscle relaxer. Letter for work provided excusing today.   F/u prn. The patient voiced understanding and agreement to the plan.   Jilda Rocheicholas Paul Mel[place] Village[place], DO 07/02/21  12:03 PM

## 2021-07-02 NOTE — Patient Instructions (Addendum)
Heat (pad or rice pillow in microwave) over affected area, 10-15 minutes twice daily.   Ice/cold pack over area for 10-15 min twice daily.  OK to continue Tylenol.   Let us know if you need anything.  Ankle Exercises It is normal to feel mild stretching, pulling, tightness, or discomfort as you do these exercises, but you should stop right away if you feel sudden pain or your pain gets worse.  Stretching and range of motion exercises These exercises warm up your muscles and joints and improve the movement and flexibility of your ankle. These exercises also help to relieve pain, numbness, and tingling. Exercise A: Dorsiflexion/Plantar Flexion    Sit with your affected knee straight or bent. Do not rest your foot on anything. Flex your affected ankle to tilt the top of your foot toward your shin. Hold this position for 5 seconds. Point your toes downward to tilt the top of your foot away from your shin. Hold this position for 5 seconds. Repeat 2 times. Complete this exercise 3 times per week. Exercise B: Ankle Alphabet    Sit with your affected foot supported at your lower leg. Do not rest your foot on anything. Make sure your foot has room to move freely. Think of your affected foot as a paintbrush, and move your foot to trace each letter of the alphabet in the air. Keep your hip and knee still while you trace. Make the letters as large as you can without increasing any discomfort. Trace every letter from A to Z. Repeat 2 times. Complete this exercise 3 times per week. Strengthening exercises These exercises build strength and endurance in your ankle. Endurance is the ability to use your muscles for a long time, even after they get tired. Exercise D: Dorsiflexors    Secure a rubber exercise band or tube to an object, such as a table leg, that will stay still when the band is pulled. Secure the other end around your affected foot. Sit on the floor, facing the object with your  affected leg extended. The band or tube should be slightly tense when your foot is relaxed. Slowly flex your affected ankle and toes to bring your foot toward you. Hold this position for 3 seconds.  Slowly return your foot to the starting position, controlling the band as you do that. Do a total of 10 repetitions. Repeat 2 times. Complete this exercise 3 times per week. Exercise E: Plantar Flexors    Sit on the floor with your affected leg extended. Loop a rubber exercise band or tube around the ball of your affected foot. The ball of your foot is on the walking surface, right under your toes. The band or tube should be slightly tense when your foot is relaxed. Slowly point your toes downward, pushing them away from you. Hold this position for 3 seconds. Slowly release the tension in the band or tube, controlling smoothly until your foot is back in the starting position. Repeat for a total of 10 repetitions. Repeat 2 times. Complete this exercise 3 times per week. Exercise F: Towel Curls    Sit in a chair on a non-carpeted surface, and put your feet on the floor. Place a towel in front of your feet.  Keeping your heel on the floor, put your affected foot on the towel. Pull the towel toward you by grabbing the towel with your toes and curling them under. Keep your heel on the floor. Let your toes relax. Grab the towel  again. Keep going until the towel is completely underneath your foot. Repeat for a total of 10 repetitions. Repeat 2 times. Complete this exercise 3 times per week. Exercise G: Heel Raise ( Plantar Flexors, Standing)     Stand with your feet shoulder-width apart. Keep your weight spread evenly over the width of your feet while you rise up on your toes. Use a wall or table to steady yourself, but try not to use it for support. If this exercise is too easy, try these options: Shift your weight toward your affected leg until you feel challenged. If told by your health care  provider, lift your uninjured leg off the floor. Hold this position for 3 seconds. Repeat for a total of 10 repetitions. Repeat 2 times. Complete this exercise 3 times per week. Exercise H: Tandem Walking Stand with one foot directly in front of the other. Slowly raise your back foot up, lifting your heel before your toes, and place it directly in front of your other foot. Continue to walk in this heel-to-toe way for 10 steps or for as long as told by your health care provider. Have a countertop or wall nearby to use if needed to keep your balance, but try not to hold onto anything for support. Repeat 2 times. Complete this exercises 3 times per week. Make sure you discuss any questions you have with your health care provider. Document Released: 04/06/2005 Document Revised: 01/21/2016 Document Reviewed: 02/08/2015 Elsevier Interactive Patient Education  2018 [employer]. Stretching and range of motion exercises These exercises warm up your muscles and joints and improve the movement and flexibility of your lower leg. These exercises also help to relieve pain and stiffness.  Exercise A: Gastrocnemius stretch Sit with your left / right leg extended. Loop a belt or towel around the ball of your left / right foot. The ball of your foot is on the walking surface, right under your toes. Hold both ends of the belt or towel. Keep your left / right ankle and foot relaxed and keep your knee straight while you use the belt or towel to pull your foot and ankle toward you. Stop at the first point of resistance. Hold this position for 30 seconds. Repeat 2 times. Complete this exercise 3 times per week.  Exercise B: Ankle alphabet Sit with your left / right leg supported at the lower leg. Do not rest your foot on anything. Make sure your foot has room to move freely. Think of your left / right foot as a paintbrush, and move your foot to trace each letter of the alphabet in the air. Keep your hip and  knee still while you trace. Trace every letter from A to Z. Repeat 2 times. Complete this exercise 3 times per week.  Strengthening exercises These exercises build strength and endurance in your lower leg. Endurance is the ability to use your muscles for a long time, even after they get tired.  Exercise C: Plantar flexors with band Sit with your left / right leg extended. Loop a rubber exercise band or tube around the ball of your left / right foot. The ball of your foot is on the walking surface, right under your toes. While holding both ends of the band or tube, slowly point your toes downward, pushing them away from you. Hold this position for 3 seconds. Slowly return your foot to the starting position and repeat for a total of 10 repetitions. Repeat 2 times. Complete this exercise 3 times  per week.  Exercise D: Plantar flexors, standing Stand with your feet shoulder-width apart. Place your hands on a wall or table to steady yourself as needed, but try not to use it very much for support. Rise up on your toes. If this exercise is too easy, try these options: Shift your weight toward your left / right leg until you feel challenged. If told by your health care provider, stand on your left / right foot only. Hold this position for 3 seconds. Repeat for a total of 10 repetitions. Repeat 2 times. Complete this exercise 3 times per week.  Exercise E: Plantar flexors, eccentric Stand on the balls of your feet on the edge of a step. The ball of your foot is on the walking surface, right under your toes. Place your hands on a wall or railing for balance as needed, but try not to lean on it for support. Rise up on your toes, using both legs to help. Slowly shift all of your weight to your left / right foot and lift your other foot off the step. Slowly lower your left / right heel so it drops below the level of the step. You will feel a slight stretch in your left / right calf. Put your other  foot back onto the step. Repeat 2 times. Complete this exercise 3 times per week. This information is not intended to replace advice given to you by your health care provider. Make sure you discuss any questions you have with your health care provider.

## 2021-07-06 ENCOUNTER — Telehealth: Payer: Self-pay | Admitting: Family Medicine

## 2021-07-06 ENCOUNTER — Encounter: Payer: Self-pay | Admitting: Family Medicine

## 2021-07-06 DIAGNOSIS — M79661 Pain in right lower leg: Secondary | ICD-10-CM

## 2021-07-06 NOTE — Telephone Encounter (Signed)
Patient states her foot is still not feeling good since her 01/27 OV with Dr. Nani Ravens, she states it's very achy and is scared to go to work because on the pain. She would like advice on what to do.

## 2021-07-06 NOTE — Telephone Encounter (Signed)
Referral done And letter printed Patient informed to pickup at the front desk [facility] through [facility] [place], Left detailed message

## 2021-07-06 NOTE — Telephone Encounter (Signed)
OK to extend time away from work and refer to sports med. Ty.

## 2021-07-09 ENCOUNTER — Ambulatory Visit: Payer: BC Managed Care – PPO | Admitting: Family Medicine

## 2021-07-09 ENCOUNTER — Telehealth: Payer: Self-pay | Admitting: *Deleted

## 2021-07-09 ENCOUNTER — Ambulatory Visit (INDEPENDENT_AMBULATORY_CARE_PROVIDER_SITE_OTHER): Payer: BC Managed Care – PPO | Admitting: *Deleted

## 2021-07-09 ENCOUNTER — Encounter: Payer: Self-pay | Admitting: Family Medicine

## 2021-07-09 ENCOUNTER — Ambulatory Visit: Payer: BC Managed Care – PPO

## 2021-07-09 ENCOUNTER — Ambulatory Visit: Payer: Self-pay

## 2021-07-09 VITALS — BP 140/92 | Ht 61.0 in | Wt 112.0 lb

## 2021-07-09 DIAGNOSIS — M25571 Pain in right ankle and joints of right foot: Secondary | ICD-10-CM

## 2021-07-09 DIAGNOSIS — M7671 Peroneal tendinitis, right leg: Secondary | ICD-10-CM

## 2021-07-09 DIAGNOSIS — M810 Age-related osteoporosis without current pathological fracture: Secondary | ICD-10-CM | POA: Diagnosis not present

## 2021-07-09 DIAGNOSIS — M2170 Unequal limb length (acquired), unspecified site: Secondary | ICD-10-CM | POA: Diagnosis not present

## 2021-07-09 MED ORDER — PREDNISONE 20 MG PO TABS: ORAL_TABLET | ORAL | 0 refills | Status: DC

## 2021-07-09 MED ORDER — DENOSUMAB 60 MG/ML ~~LOC~~ SOSY
60.0000 mg | PREFILLED_SYRINGE | Freq: Once | SUBCUTANEOUS | Status: AC
  Administered 2021-07-09: 60 mg via SUBCUTANEOUS

## 2021-07-09 NOTE — Assessment & Plan Note (Addendum)
Acutely occurring.  Symptoms seem more associated with an inflammatory origin. -Counseled on home exercise therapy and supportive care. -Prednisone. - provided work note.  - could consider injection or physical therapy.

## 2021-07-09 NOTE — Progress Notes (Signed)
°  Jamie Cox - 61 y.o. female MRN [id number]  Date of birth: 09-06-1960  SUBJECTIVE:  Including CC & ROS.  No chief complaint on file.   Jamie Cox is a 61 y.o. female that is presenting with acute right lower leg pain.  The pains been ongoing for.  The pain is severe in nature.  Is occurring in her lateral aspect of right ankle and lower leg.  No history of similar pain.  She does have a history of rheumatoid arthritis and osteoporosis.  Reviewed the note from 1/27 shows she was provided meloxicam and supportive care.   Review of Systems See HPI   HISTORY: Past Medical, Surgical, Social, and Family History Reviewed & Updated per EMR.   Pertinent Historical Findings include:  Past Medical History:  Diagnosis Date   Allergy    Asthma    Atypical chest pain    Chronic sinusitis    Followed by Dr. Jenne Pane   Environmental allergies    GERD (gastroesophageal reflux disease)    High serum parathyroid hormone (PTH) 06/09/2016   History of chicken pox    Hypercalcemia 07/19/2015   Osteoporosis, unspecified    steroid induced   Personal history of other diseases of circulatory system    Rheumatoid arthritis(714.0)    Unspecified deficiency anemia    microcytic   Urticaria     Past Surgical History:  Procedure Laterality Date   NASAL SINUS SURGERY  06/2012   NASAL SINUS SURGERY     2010     PHYSICAL EXAM:  VS: BP (!) 140/92 (BP Location: Left Arm, Patient Position: Sitting)    Ht 5\' 1"  (1.549 m)    Wt 112 lb (50.8 kg)    BMI 21.16 kg/m  Physical Exam Gen: NAD, alert, cooperative with exam, well-appearing MSK:  Neurovascularly intact    Limited ultrasound: Right ankle:  Effusion ankle joint. Normal-appearing distal fibula. Increased hyperemia over the proximal peroneal musculature and of the tendon at the lateral malleolus. Mild effusion of the peroneal tendons No acute changes of the Achilles tendon  Summary: Peroneal tendinitis  Ultrasound and interpretation  by Clare Gandy, MD    ASSESSMENT & PLAN:   Peroneal tendinitis of right lower extremity Acutely occurring.  Symptoms seem more associated with an inflammatory origin. -Counseled on home exercise therapy and supportive care. -Prednisone. - provided work note.  - could consider injection or physical therapy.   Leg length discrepancy Left leg is mild shorter than right.  - Green sport insoles - heel lift on left  - could consider custom orthotics.

## 2021-07-09 NOTE — Assessment & Plan Note (Signed)
Left leg is mild shorter than right.  - Green sport insoles - heel lift on left  - could consider custom orthotics.

## 2021-07-09 NOTE — Patient Instructions (Signed)
Nice to meet you Please try ice  Please try the insoles  Please try the medicine   Please send me a message in MyChart with any questions or updates.  Please see me back in 2-3 weeks.   --Dr. Jordan Likes

## 2021-07-09 NOTE — Telephone Encounter (Signed)
Prolia injection given today. 

## 2021-07-09 NOTE — Progress Notes (Signed)
Patient here for Prolia injection per PCP orders.  Injection given in right arm and patient tolerated well.

## 2021-07-14 NOTE — Telephone Encounter (Signed)
Kem Boroughs D, CMA 5 days ago   Prolia injection given today.

## 2021-07-23 ENCOUNTER — Other Ambulatory Visit: Payer: Self-pay | Admitting: Family Medicine

## 2021-07-23 ENCOUNTER — Ambulatory Visit: Payer: BC Managed Care – PPO | Admitting: Family Medicine

## 2021-07-23 ENCOUNTER — Other Ambulatory Visit: Payer: Self-pay | Admitting: Medical

## 2021-07-23 ENCOUNTER — Other Ambulatory Visit: Payer: Self-pay

## 2021-07-23 DIAGNOSIS — M069 Rheumatoid arthritis, unspecified: Secondary | ICD-10-CM

## 2021-07-23 DIAGNOSIS — M79661 Pain in right lower leg: Secondary | ICD-10-CM

## 2021-07-23 MED ORDER — HYDROCODONE-ACETAMINOPHEN 5-325 MG PO TABS: 1.0000 | ORAL_TABLET | Freq: Four times a day (QID) | ORAL | 0 refills | Status: DC | PRN

## 2021-07-23 NOTE — Telephone Encounter (Signed)
Requesting: oxycodone Contract:06/11/21 UDS:06/11/21 Last Visit:07/02/21 Next Visit:11/30/21 Last Refill:04/27/21  Please Advise

## 2021-07-27 NOTE — Telephone Encounter (Signed)
Last Prolia inj 07/09/21 °Next Prolia inj due 01/07/22 °

## 2021-07-29 ENCOUNTER — Other Ambulatory Visit: Payer: Self-pay | Admitting: Family Medicine

## 2021-08-02 ENCOUNTER — Telehealth: Payer: Self-pay | Admitting: Family Medicine

## 2021-08-02 ENCOUNTER — Ambulatory Visit: Payer: BC Managed Care – PPO | Admitting: Family Medicine

## 2021-08-02 DIAGNOSIS — M7671 Peroneal tendinitis, right leg: Secondary | ICD-10-CM | POA: Diagnosis not present

## 2021-08-02 NOTE — Telephone Encounter (Signed)
Left message on machine that patient needs to be evaluated first before we can send anything in and to call back to schedule appointment.

## 2021-08-02 NOTE — Telephone Encounter (Signed)
Patient has a flare up of mucus, a few weeks now(green in color) has a cough & head pressure   Patient was wanting to know if we could send something in

## 2021-08-02 NOTE — Progress Notes (Signed)
°  BIRDELLA NASSIF - 61 y.o. female MRN [id number]  Date of birth: September 01, 1960  SUBJECTIVE:  Including CC & ROS.  No chief complaint on file.   CHRISHAUNA BERNHEISEL is a 61 y.o. female that is following up for her right foot and ankle pain.  She has improved with the medication.  She has been using the insoles.    Review of Systems See HPI   HISTORY: Past Medical, Surgical, Social, and Family History Reviewed & Updated per EMR.   Pertinent Historical Findings include:  Past Medical History:  Diagnosis Date   Allergy    Asthma    Atypical chest pain    Chronic sinusitis    Followed by Dr. Redmond Baseman   Environmental allergies    GERD (gastroesophageal reflux disease)    High serum parathyroid hormone (PTH) 06/09/2016   History of chicken pox    Hypercalcemia 07/19/2015   Osteoporosis, unspecified    steroid induced   Personal history of other diseases of circulatory system    Rheumatoid arthritis(714.0)    Unspecified deficiency anemia    microcytic   Urticaria     Past Surgical History:  Procedure Laterality Date   NASAL SINUS SURGERY  06/2012   NASAL SINUS SURGERY     2010     PHYSICAL EXAM:  VS: BP 120/80    Ht 5' 0.5" (1.537 m)    Wt 108 lb (49 kg)    BMI 20.75 kg/m  Physical Exam Gen: NAD, alert, cooperative with exam, well-appearing MSK:  Neurovascularly intact       ASSESSMENT & PLAN:   Peroneal tendinitis of right lower extremity Has improved with modalities today.  Pain is not completely gone.  Has follow-up with rheumatology next month.  Can take meloxicam as needed -Counseled on home exercise therapy and supportive care. -Could consider injection.

## 2021-08-02 NOTE — Assessment & Plan Note (Signed)
Has improved with modalities today.  Pain is not completely gone.  Has follow-up with rheumatology next month.  Can take meloxicam as needed -Counseled on home exercise therapy and supportive care. -Could consider injection.

## 2021-08-04 ENCOUNTER — Encounter: Payer: Self-pay | Admitting: Family Medicine

## 2021-08-04 ENCOUNTER — Telehealth (INDEPENDENT_AMBULATORY_CARE_PROVIDER_SITE_OTHER): Payer: BC Managed Care – PPO | Admitting: Family Medicine

## 2021-08-04 DIAGNOSIS — J0140 Acute pansinusitis, unspecified: Secondary | ICD-10-CM | POA: Diagnosis not present

## 2021-08-04 MED ORDER — AZITHROMYCIN 250 MG PO TABS: ORAL_TABLET | ORAL | 0 refills | Status: DC

## 2021-08-04 MED ORDER — FLUTICASONE PROPIONATE 50 MCG/ACT NA SUSP: 2.0000 | Freq: Every day | NASAL | 0 refills | Status: DC

## 2021-08-04 NOTE — Progress Notes (Signed)
Virtual Video Visit via MyChart Note ? ?I connected with  Jamie Cox on 08/04/21 at 11:00 AM EST by the video enabled telemedicine application for MyChart, and verified that I am speaking with the correct person using two identifiers. ?  ?I introduced myself as a Publishing rights manager with the practice. We discussed the limitations of evaluation and management by telemedicine and the availability of in person appointments. The patient expressed understanding and agreed to proceed. ? ?Participating parties in this visit include: The patient and the nurse practitioner listed.  ?The patient is: At home ?I am: In the office - [facility] ? ?Subjective:   ? ?CC: sinusitis ? ? ?HPI: Jamie Cox is a 62 y.o. year old female presenting today via MyChart today for sinusitis. ? ?Patient reports she has had several weeks of frontal sinus pressure, drainage, coughing, yellow/green mucus, headaches. She denies any chest pain, dyspnea, fevers, ear pain. Recently had a prednisone taper for arthritis. Otherwise, has been doing OTC management without much improvement.  ? ? ? ?Past medical history, Surgical history, Family history not pertinant except as noted below, Social history, Allergies, and medications have been entered into the medical record, reviewed, and corrections made.  ? ?Review of Systems:  ?All review of systems negative except what is listed in the HPI ? ? ?Objective:   ? ?General:  ?Speaking clearly in complete sentences. ?Absent shortness of breath noted.   ?Alert and oriented x3.   ?Normal judgment.  ?Absent acute distress. ? ? ?Impression and Recommendations:   ? ?1. Acute non-recurrent pansinusitis ?- azithromycin (ZITHROMAX Z-PAK) 250 MG tablet; Take 2 tablets (500 mg) on  Day 1,  followed by 1 tablet (250 mg) once daily on Days 2 through 5.  Dispense: 6 tablet; Refill: 0 ?- fluticasone (FLONASE) 50 MCG/ACT nasal spray; Place 2 sprays into both nostrils daily.   Dispense: 1 g; Refill: 0 ? ?Given duration, treating with Zpak (multiple ABX allergies) and adding Flonase. States she has other OTC medications. Continue supportive measures including rest, hydration, humidifier use, steam showers, warm compresses to sinuses, warm liquids with lemon and honey, and over-the-counter cough, cold, and analgesics as needed.  Patient aware of signs/symptoms requiring further/urgent evaluation.  ? ? ?Follow-up if symptoms worsen or fail to improve.  ?  ?I discussed the assessment and treatment plan with the patient. The patient was provided an opportunity to ask questions and all were answered. The patient agreed with the plan and demonstrated an understanding of the instructions. ? ?  ?The patient was advised to call back or seek an in-person evaluation if the symptoms worsen or if the condition fails to improve as anticipated. ? ?I spent 20 minutes dedicated to the care of this patient on the date of this encounter to include pre-visit chart review of prior notes and results, face-to-face time with the patient, and post-visit ordering of testing as indicated.  ? ?Clayborne Dana, NP  ? ?

## 2021-08-04 NOTE — Progress Notes (Signed)
Sx started a couple weeks ago ?Sinus drainage ?Productive cough, sometimes greenish ?Headache in the mornings a few times ?Taking Tylenol Arthritis, allegra, generic mucinex ?

## 2021-08-06 ENCOUNTER — Other Ambulatory Visit: Payer: Self-pay | Admitting: Family Medicine

## 2021-08-12 ENCOUNTER — Telehealth: Payer: Self-pay | Admitting: Family Medicine

## 2021-08-12 NOTE — Telephone Encounter (Signed)
Patient saw Lovena Le on 03/01, and she states she is still having a lot of green and brown mucus build up. She would like to know what to do. Please advise. ?

## 2021-08-13 NOTE — Telephone Encounter (Signed)
Appointment made for 03/13. ?

## 2021-08-15 NOTE — Progress Notes (Signed)
Acute Office Visit  Subjective:    Patient ID: Jamie Cox, female    DOB: [birth year], 61 y.o.   MRN: [id number]  CC: ongoing sinusitis/cough   HPI Patient is in today for ongoing sinusitis/cough.  08/04/21 - Virtual visit for sinusitis symptoms going on for several weeks. She was started on Zpak (multiple ABX allergies) and Flonase; supportive measure reinforced.   Today she reports she only noticed mild improvement with the Zpack. She continues to have a lot of nasal congestion, post nasal drip, productive cough, greenish-yellow mucus, right ear discomfort. She denies any chest pain, dyspnea, wheezing, headache, fevers, GI/GU symptoms, seasonal allergies, body aches, fatigue.   She has spoken to pulmonology and they requested for her to bring in a sputum sample, but she has not been able to (transportation issues) planning to make an appointment with them       Past Medical History:  Diagnosis Date   Allergy    Asthma    Atypical chest pain    Chronic sinusitis    Followed by Dr. Jenne Pane   Environmental allergies    GERD (gastroesophageal reflux disease)    High serum parathyroid hormone (PTH) 06/09/2016   History of chicken pox    Hypercalcemia 07/19/2015   Osteoporosis, unspecified    steroid induced   Personal history of other diseases of circulatory system    Rheumatoid arthritis(714.0)    Unspecified deficiency anemia    microcytic   Urticaria     Past Surgical History:  Procedure Laterality Date   NASAL SINUS SURGERY  06/2012   NASAL SINUS SURGERY     2010    Family History  Problem Relation Age of Onset   Liver cancer Maternal Grandmother    Hypertension Maternal Grandmother    Heart attack Maternal Grandmother    Scleroderma Mother    Heart disease Mother    Kidney disease Mother    Other Mother    Prostate cancer Father    Diabetes Father    Heart disease Father        CHF   Arthritis Father        s/p hip replacement   Cancer Father         lung cancer with mets, smoker   Lung cancer Paternal Uncle    Coronary artery disease Paternal Grandmother    Hypertension Paternal Grandmother    Liver cancer Paternal Grandmother    Hyperlipidemia Son    Alzheimer's disease Paternal Grandfather    Other Sister        Cardiomegaly   Arthritis Sister    Heart disease Sister        rare   Sleep apnea Brother    Heart disease Brother    Arthritis Daughter    Lupus Cousin    Arthritis Cousin    Colon cancer Neg Hx    Asthma Neg Hx    Allergic rhinitis Neg Hx    Eczema Neg Hx    Immunodeficiency Neg Hx    Angioedema Neg Hx     Social History   Socioeconomic History   Marital status: Married    Spouse name: Not on file   Number of children: 2   Years of education: Not on file   Highest education level: Not on file  Occupational History   Occupation: Data processing manager  Tobacco Use   Smoking status: Never   Smokeless tobacco: Never  Vaping Use   Vaping Use: Never used  Substance  and Sexual Activity   Alcohol use: No    Alcohol/week: 0.0 standard drinks   Drug use: No   Sexual activity: Not Currently  Other Topics Concern   Not on file  Social History Narrative   Not on file   Social Determinants of Health   Financial Resource Strain: Not on file  Food Insecurity: Not on file  Transportation Needs: Not on file  Physical Activity: Not on file  Stress: Not on file  Social Connections: Not on file  Intimate Partner Violence: Not on file    Outpatient Medications Prior to Visit  Medication Sig Dispense Refill   albuterol (VENTOLIN HFA) 108 (90 Base) MCG/ACT inhaler INHALE ONE TO TWO PUFFS INTO THE LUNGS EVERY 6 HOURS AS NEEDED FOR WHEEZING OR SHORTNESS OF BREATH 18 g 5   atorvastatin (LIPITOR) 10 MG tablet Take 1 tablet by mouth once daily 90 tablet 1   azithromycin (ZITHROMAX Z-PAK) 250 MG tablet Take 2 tablets (500 mg) on  Day 1,  followed by 1 tablet (250 mg) once daily on Days 2 through 5. 6 tablet 0    COVID-19 mRNA bivalent vaccine, Pfizer, (PFIZER COVID-19 VAC BIVALENT) injection Inject into the muscle. 0.3 mL 0   COVID-19 mRNA Vac-TriS, Pfizer, (PFIZER-BIONT COVID-19 VAC-TRIS) SUSP injection Inject into the muscle. 0.3 mL 0   famotidine (PEPCID) 40 MG tablet TAKE 1 TABLET BY MOUTH EVERY DAY 90 tablet 1   fexofenadine (ALLEGRA) 180 MG tablet Take 180 mg by mouth daily.     fluticasone (FLONASE) 50 MCG/ACT nasal spray Place 2 sprays into both nostrils daily. 1 g 0   gabapentin (NEURONTIN) 300 MG capsule TAKE 1 CAPSULE BY MOUTH THREE TIMES DAILY 270 capsule 0   HEMATINIC/FOLIC ACID 324-1 MG TABS TAKE 1 TABLET BY MOUTH EVERY DAY 90 tablet 1   HYDROcodone-acetaminophen (NORCO/VICODIN) 5-325 MG tablet Take 1 tablet by mouth every 6 (six) hours as needed for moderate pain or severe pain. 40 tablet 0   levocetirizine (XYZAL) 5 MG tablet Take 1 tablet (5 mg total) by mouth every evening. 30 tablet 0   meloxicam (MOBIC) 7.5 MG tablet TAKE 1 TO 2 TABLETS BY MOUTH ONCE DAILY AS NEEDED FOR PAIN 30 tablet 0   metoprolol succinate (TOPROL-XL) 25 MG 24 hr tablet Take 1 tablet by mouth once daily 90 tablet 1   montelukast (SINGULAIR) 10 MG tablet TAKE 1 TABLET BY MOUTH AT BEDTIME 90 tablet 1   pantoprazole (PROTONIX) 40 MG tablet Take 1 tablet by mouth once daily 90 tablet 0   predniSONE (DELTASONE) 2.5 MG tablet Take 1 tablet by mouth once daily with breakfast 30 tablet 0   predniSONE (DELTASONE) 20 MG tablet TAKE 3 TABS ONCE DAILY FOR 3 DAYS, 2 FOR 3 DAYS, 1 FOR 2 DAYS THEN 1/2 FOR 2 DAYS 18 tablet 0   sodium chloride 0.9 % nebulizer solution Take 3 mLs by nebulization in the morning and at bedtime. 90 mL 12   tiZANidine (ZANAFLEX) 4 MG tablet Take 1 tablet (4 mg total) by mouth every 6 (six) hours as needed for muscle spasms. 30 tablet 0   No facility-administered medications prior to visit.    Allergies  Allergen Reactions   Avelox [Moxifloxacin Hcl In Nacl] Diarrhea, Nausea And Vomiting and Other  (See Comments)    Dizziness, Cold chills    Bactrim [Sulfamethoxazole-Trimethoprim] Hives   Doxycycline     Abdominal discomfort, anorexia   Sulfamethoxazole-Trimethoprim Hives   Augmentin [Amoxicillin-Pot Clavulanate] Hives, Itching  and Swelling    Review of Systems All review of systems negative except what is listed in the HPI     Objective:    Physical Exam Vitals reviewed.  Constitutional:      Appearance: Normal appearance. She is normal weight.  HENT:     Head: Normocephalic and atraumatic.     Right Ear: Tympanic membrane normal.     Left Ear: Tympanic membrane normal.     Nose: Congestion present.     Mouth/Throat:     Mouth: Mucous membranes are moist.     Pharynx: Oropharynx is clear. No posterior oropharyngeal erythema.  Eyes:     Extraocular Movements: Extraocular movements intact.     Conjunctiva/sclera: Conjunctivae normal.  Cardiovascular:     Rate and Rhythm: Normal rate and regular rhythm.  Pulmonary:     Effort: Pulmonary effort is normal.     Breath sounds: Normal breath sounds. No wheezing, rhonchi or rales.  Skin:    General: Skin is warm and dry.  Neurological:     General: No focal deficit present.     Mental Status: She is alert and oriented to person, place, and time. Mental status is at baseline.  Psychiatric:        Mood and Affect: Mood normal.        Behavior: Behavior normal.        Thought Content: Thought content normal.        Judgment: Judgment normal.    There were no vitals taken for this visit. Wt Readings from Last 3 Encounters:  08/02/21 108 lb (49 kg)  07/09/21 112 lb (50.8 kg)  07/02/21 112 lb 4 oz (50.9 kg)    Health Maintenance Due  Topic Date Due   FOOT EXAM  Never done   OPHTHALMOLOGY EXAM  Never done   URINE MICROALBUMIN  Never done   Zoster Vaccines- Shingrix (1 of 2) Never done   PAP SMEAR-Modifier  03/05/2019   TETANUS/TDAP  05/16/2019   INFLUENZA VACCINE  01/04/2021    There are no preventive care  reminders to display for this patient.   Lab Results  Component Value Date   TSH 0.87 06/11/2021   Lab Results  Component Value Date   WBC 4.1 06/11/2021   HGB 11.3 (L) 06/11/2021   HCT 37.3 06/11/2021   MCV 73.9 (L) 06/11/2021   PLT 169.0 06/11/2021   Lab Results  Component Value Date   NA 139 06/11/2021   K 4.7 06/11/2021   CO2 26 06/11/2021   GLUCOSE 84 06/11/2021   BUN 15 06/11/2021   CREATININE 0.78 06/11/2021   BILITOT 0.3 06/11/2021   ALKPHOS 64 06/11/2021   AST 18 06/11/2021   ALT 13 06/11/2021   PROT 8.0 06/11/2021   ALBUMIN 4.2 06/11/2021   CALCIUM 10.3 06/11/2021   ANIONGAP 14 09/21/2019   GFR 81.22 06/11/2021   Lab Results  Component Value Date   CHOL 191 06/11/2021   Lab Results  Component Value Date   HDL 88.50 06/11/2021   Lab Results  Component Value Date   LDLCALC 90 06/11/2021   Lab Results  Component Value Date   TRIG 62.0 06/11/2021   Lab Results  Component Value Date   CHOLHDL 2 06/11/2021   Lab Results  Component Value Date   HGBA1C 6.7 (H) 06/11/2021       Assessment & Plan:   1. Acute non-recurrent pansinusitis Changing up antibiotics - Cefdinir - has tolerated well in the past  despite PCN allergy. Continue Flonase, Mucinex, allergy medication, humidifier use.  Continue supportive measures including rest, hydration, humidifier use, steam showers, warm compresses to sinuses, warm liquids with lemon and honey, and over-the-counter cough, cold, and analgesics as needed.   Patient aware of signs/symptoms requiring further/urgent evaluation.   - cefdinir (OMNICEF) 300 MG capsule; Take 1 capsule (300 mg total) by mouth 2 (two) times daily for 7 days.  Dispense: 14 capsule; Refill: 0  Please contact office for follow-up if symptoms do not improve or worsen. Seek emergency care if symptoms become severe.   Clayborne Dana, NP

## 2021-08-16 ENCOUNTER — Encounter: Payer: Self-pay | Admitting: Family Medicine

## 2021-08-16 ENCOUNTER — Ambulatory Visit: Payer: BC Managed Care – PPO | Admitting: Family Medicine

## 2021-08-16 VITALS — BP 129/86 | HR 84 | Ht 60.05 in | Wt 114.4 lb

## 2021-08-16 DIAGNOSIS — J0140 Acute pansinusitis, unspecified: Secondary | ICD-10-CM

## 2021-08-16 MED ORDER — CEFDINIR 300 MG PO CAPS: 300.0000 mg | ORAL_CAPSULE | Freq: Two times a day (BID) | ORAL | 0 refills | Status: AC

## 2021-08-16 NOTE — Patient Instructions (Signed)
Changing up antibiotics. The Cefdinir is sort of like a cousin to penicillin antibiotics, but you took this last year without any problem so we will try it again. Stop if any sign of allergic reaction.  ?Continue Flonase, Mucinex, allergy medication, humidifier use.  ?Continue supportive measures including rest, hydration, humidifier use, steam showers, warm compresses to sinuses, warm liquids with lemon and honey, and over-the-counter cough, cold, and analgesics as needed.   ? ?Please contact office for follow-up if symptoms do not improve or worsen. Seek emergency care if symptoms become severe. ? ?

## 2021-09-01 ENCOUNTER — Other Ambulatory Visit: Payer: Self-pay | Admitting: Family Medicine

## 2021-09-07 ENCOUNTER — Other Ambulatory Visit: Payer: Self-pay | Admitting: Family Medicine

## 2021-09-07 DIAGNOSIS — M79661 Pain in right lower leg: Secondary | ICD-10-CM

## 2021-09-07 NOTE — Telephone Encounter (Signed)
Patient states she is still not feeling good since her 03/13 appointment, she is no longer having headaches but is still having green and yellow mucus. She would like to know what to do. Please advise.  ?

## 2021-09-07 NOTE — Telephone Encounter (Signed)
Definitely needs appointment.

## 2021-09-08 NOTE — Telephone Encounter (Signed)
LVM for pt to call back and schedule appt. 

## 2021-09-13 ENCOUNTER — Ambulatory Visit: Payer: BC Managed Care – PPO | Admitting: Family Medicine

## 2021-09-13 ENCOUNTER — Encounter: Payer: Self-pay | Admitting: Family Medicine

## 2021-09-13 ENCOUNTER — Telehealth: Payer: Self-pay | Admitting: Family Medicine

## 2021-09-13 VITALS — BP 130/80 | HR 111 | Temp 97.4°F | Resp 16 | Ht 60.0 in | Wt 113.0 lb

## 2021-09-13 DIAGNOSIS — M069 Rheumatoid arthritis, unspecified: Secondary | ICD-10-CM

## 2021-09-13 NOTE — Telephone Encounter (Signed)
The patient forgot to let know that she is still having some congestion/cough. ?Completed prescription given to her on 08/16/21 ?Did not know if there was anything else she could do. ?

## 2021-09-13 NOTE — Patient Instructions (Signed)
Drop off your form for me to fill out.  ? ?Let us know if you need anything. ?

## 2021-09-13 NOTE — Telephone Encounter (Signed)
Faxed FMLA (completed)to [employer] at [phone] and [phone]. ?Patient aware ?Sent to scan ? ?

## 2021-09-13 NOTE — Progress Notes (Signed)
Chief Complaint  ?Patient presents with  ? FMLA Paperwork   ?  Here for FMLA paperwork   ? ? ?Subjective: ?Patient is a 61 y.o. female here for follow-up. ? ?Patient has a history of rheumatoid arthritis.  She takes 5 mg of prednisone daily.  She will intermittently have flares in her ankles requiring her to miss work.  She is requesting FMLA to reflect that.  Previously done by her PCP, and had her off 3 days/month requiring visits every other month.  She is not currently having a flare. ? ?Past Medical History:  ?Diagnosis Date  ? Allergy   ? Asthma   ? Atypical chest pain   ? Chronic sinusitis   ? Followed by Dr. Jenne Pane  ? Environmental allergies   ? GERD (gastroesophageal reflux disease)   ? High serum parathyroid hormone (PTH) 06/09/2016  ? History of chicken pox   ? Hypercalcemia 07/19/2015  ? Osteoporosis, unspecified   ? steroid induced  ? Personal history of other diseases of circulatory system   ? Rheumatoid arthritis(714.0)   ? Unspecified deficiency anemia   ? microcytic  ? Urticaria   ? ? ?Objective: ?BP 130/80 (BP Location: Right Arm, Patient Position: Sitting, Cuff Size: Normal)   Pulse (!) 111   Temp (!) 97.4 ?F (36.3 ?C) (Oral)   Resp 16   Ht 5' (1.524 m)   Wt 113 lb (51.3 kg)   SpO2 98%   BMI 22.07 kg/m?  ?General: Awake, appears stated age ?Lungs:  No accessory muscle use ?Psych: Age appropriate judgment and insight, normal affect and mood ? ?Assessment and Plan: ?Rheumatoid arthritis, involving unspecified site, unspecified whether rheumatoid factor present (HCC) ? ?FMLA- 3 days off per month for possible flares. Requiring appts q 2 months. This is what she had from Dr. Abner Greenspan who filled out form most recently (2018).  ?The patient voiced understanding and agreement to the plan. ? ?I spent 25 min w the patient discussing the above plan, reviewing previous FMLA form and filling out current one on the same day of the visit.  ? ?Sharlene Dory, DO ?09/13/21  ?4:31 PM ? ? ? ? ?

## 2021-09-14 NOTE — Telephone Encounter (Signed)
Spoke to the patient ?She will keep her follow-up with her pulmonary doctor on 09/27/21. ? ?

## 2021-09-27 ENCOUNTER — Encounter: Payer: Self-pay | Admitting: Internal Medicine

## 2021-09-27 ENCOUNTER — Ambulatory Visit: Payer: BC Managed Care – PPO | Admitting: Internal Medicine

## 2021-09-27 VITALS — BP 124/82 | HR 62 | Ht 60.0 in | Wt 113.0 lb

## 2021-09-27 DIAGNOSIS — J479 Bronchiectasis, uncomplicated: Secondary | ICD-10-CM

## 2021-09-27 DIAGNOSIS — R918 Other nonspecific abnormal finding of lung field: Secondary | ICD-10-CM | POA: Diagnosis not present

## 2021-09-27 NOTE — Patient Instructions (Addendum)
Please schedule follow up scheduled with myself in 3 months.  If my schedule is not open yet, we will contact you with a reminder closer to that time. Please call [phone] if you haven't heard from [place] a month before.  ? ?Before your next visit I would like you to have: ? ?CT Scan - we will call you to schedule this. ? ?You still have sputum cups at home, so bring me sputum samples when you can. ? ?Instructions for getting a sputum sample: ? ?Collect as soon as you wake up in the morning ?Do not eat or drink anything before collecting sample ?Only one sample per day per cup. Collect each sample in its own separate cups ?Only use cup care provider provided for you ?Do not use own plastic home containers ?Wash your hands before opening collection cup ?Do not take off lip until you are ready to place sample in cup ? ?How to collect sputum sample ?1. Loosen sputum ?Take 3 slow deep breath, so you can feel it from your stomach ?If you have a vest you may use this device to help break up sputum ? ?2. Collect sample ?Wash your hands ?Take lid off ?Press rim of cup against lower lip  ?Take deep breath and hope for 2-3 seconds, then cough deeply by using stomach muscles to force out sputum. Make sure if comes from your stomach not your  ?not your throat or chest. ?GOAL: collect 3-42mL of sputum try to avoid saliva or spit ? ?3. Refrigerate sample ?Close container  ?Place date on label provided for you - should be in bag ?Put label on cup ?Put cup in bag provided to your from office ?Place in refrigerator right away. DO NOT FREEZE IT.  ?Bring your sample to the [address] location, unless instructed otherwise.  ? ?Reminders ?Patient should have 2 cups if collecting more than one order ?Obtain sample in the morning ?Return to our office within 4 hours of producing sample ?Produce enough sputum to reach the line marked by clinical staff  ?Make sure it is sputum and not spit ? ?Any questions call the  nearest office ?[phone] ?[phone] ?[place]- [phone] ? ? ?

## 2021-09-27 NOTE — Progress Notes (Signed)
? ?      ?Jamie Cox    [id number]    [birth year] ? ?Primary Care Physician:Blyth, Bonnita Levan, MD ?Date of Appointment: 09/27/2021 ?Established Patient Visit ? ?Chief complaint:   ?Chief Complaint  ?Patient presents with  ? Follow-up  ?  F/U for bronchiectasis. States she still has a cough. Denies any new symptoms.   ? ? ? ?HPI: ?Jamie Cox is a 61 y.o. woman with bronchiectasis without adherence to therapy.  ? ?Interval Updates: ? ?Last seen by me in June 2022. She saw Derl Barrow NP in September 2022. Here for follow up for bronchiectasis. Has been getting antibiotics from PCP on and off. She is also having sinus drainage. She forgets to give me samples for sputum but knows she needs to do it.  ? ?She is using albuterol inhaler prn which is infrequent use. She has saline nebs at home which she does not use.  ? ?Appetite is ok. No fevers or chills. She takes tylenol for arthritis. Weight has been stable. No nausea, vomiting.  ? ? ?Past Medical History:  ?Diagnosis Date  ? Allergy   ? Asthma   ? Atypical chest pain   ? Chronic sinusitis   ? Followed by Dr. Redmond Baseman  ? Environmental allergies   ? GERD (gastroesophageal reflux disease)   ? High serum parathyroid hormone (PTH) 06/09/2016  ? History of chicken pox   ? Hypercalcemia 07/19/2015  ? Osteoporosis, unspecified   ? steroid induced  ? Personal history of other diseases of circulatory system   ? Rheumatoid arthritis(714.0)   ? Unspecified deficiency anemia   ? microcytic  ? Urticaria   ? ? ?Past Surgical History:  ?Procedure Laterality Date  ? NASAL SINUS SURGERY  06/2012  ? NASAL SINUS SURGERY    ? 2010  ? ? ?Family History  ?Problem Relation Age of Onset  ? Liver cancer Maternal Grandmother   ? Hypertension Maternal Grandmother   ? Heart attack Maternal Grandmother   ? Scleroderma Mother   ? Heart disease Mother   ? Kidney disease Mother   ? Other Mother   ? Prostate cancer Father   ? Diabetes Father   ? Heart disease Father   ?     CHF  ? Arthritis  Father   ?     s/p hip replacement  ? Cancer Father   ?     lung cancer with mets, smoker  ? Lung cancer Paternal Uncle   ? Coronary artery disease Paternal Grandmother   ? Hypertension Paternal Grandmother   ? Liver cancer Paternal Grandmother   ? Hyperlipidemia Son   ? Alzheimer's disease Paternal Grandfather   ? Other Sister   ?     Cardiomegaly  ? Arthritis Sister   ? Heart disease Sister   ?     rare  ? Sleep apnea Brother   ? Heart disease Brother   ? Arthritis Daughter   ? Lupus Cousin   ? Arthritis Cousin   ? Colon cancer Neg Hx   ? Asthma Neg Hx   ? Allergic rhinitis Neg Hx   ? Eczema Neg Hx   ? Immunodeficiency Neg Hx   ? Angioedema Neg Hx   ? ? ?Social History  ? ?Occupational History  ? Occupation: Building control surveyor  ?Tobacco Use  ? Smoking status: Never  ? Smokeless tobacco: Never  ?Vaping Use  ? Vaping Use: Never used  ?Substance and Sexual Activity  ? Alcohol use:  No  ?  Alcohol/week: 0.0 standard drinks  ? Drug use: No  ? Sexual activity: Not Currently  ? ? ? ?Physical Exam: ?Blood pressure 124/82, pulse 62, height 5' (1.524 m), weight 113 lb (51.3 kg), SpO2 94 %. ? ?Gen:      Fatigued, no distress ?Lungs:    diminished no wheezes or crackles ?CV:         RRR no mrg ? ? ?Data Reviewed: ?Imaging: ?I have personally reviewed the CT Chest obtained May 31st 2022 which shows persistenet lower lobe predominant bronchiectasis, nodules, and concern for MAI, multiple 71mm or above pulmonary nodules ? ?PFTs: ? ? ? ?  Latest Ref Rng & Units 02/04/2021  ? 10:11 AM 03/21/2017  ?  9:55 AM 06/28/2016  ? 12:58 PM 05/06/2016  ?  1:59 PM 09/05/2014  ? 11:06 AM  ?PFT Results  ?FVC-Pre L 1.83   2.28   1.92   1.65   2.39    ?FVC-Predicted Pre % 80   96   84   72   102    ?FVC-Post L 1.80    1.96   1.75   2.38    ?FVC-Predicted Post % 78    85   76   102    ?Pre FEV1/FVC % % 77   71   83   84   72    ?Post FEV1/FCV % % 81    83   82   72    ?FEV1-Pre L 1.40   1.63   1.59   1.38   1.73    ?FEV1-Predicted Pre % 78   87   88   76    94    ?FEV1-Post L 1.46    1.62   1.43   1.73    ?DLCO uncorrected ml/min/mmHg 11.87    13.41   8.52   12.63    ?DLCO UNC% % 65    71   45   66    ?DLCO corrected ml/min/mmHg 11.87    13.14   9.62     ?DLCO COR %Predicted % 65    69   51     ?DLVA Predicted % 100    106   87   91    ?TLC L 3.64    3.31   3.27   4.03    ?TLC % Predicted % 79    74   73   90    ?RV % Predicted % 96    75   97   103    ? ?I have personally reviewed the patient's PFTs and they show mild restriction to ventilation, no airflow limitation, no significant response to bronchodilator. Moderately reduced diffusion capacity. Reduced lung function from 2018 to 2022 ? ?Labs: ?Lab Results  ?Component Value Date  ? WBC 4.1 06/11/2021  ? HGB 11.3 (L) 06/11/2021  ? HCT 37.3 06/11/2021  ? MCV 73.9 (L) 06/11/2021  ? PLT 169.0 06/11/2021  ? ?Lab Results  ?Component Value Date  ? NA 139 06/11/2021  ? K 4.7 06/11/2021  ? CL 104 06/11/2021  ? CO2 26 06/11/2021  ? ? ?Immunization status: ?Immunization History  ?Administered Date(s) Administered  ? Influenza Split 04/05/2011  ? Influenza Whole 04/10/2008, 05/15/2009, 05/21/2010, 02/05/2012  ? Influenza,inj,Quad PF,6+ Mos 08/06/2013, 05/09/2016, 04/10/2018  ? Janssen (J&J) SARS-COV-2 Vaccination 08/17/2019  ? PFIZER Comirnaty(Gray Top)Covid-19 Tri-Sucrose Vaccine 10/27/2020  ? PNEUMOCOCCAL CONJUGATE-20 09/28/2020  ? Pfizer Covid-19 Vaccine  Bivalent Booster 24yrs & up 06/11/2021  ? Pneumococcal Polysaccharide-23 07/01/2010  ? Td 05/15/2009  ? ? ?Assessment:  ?Bronchiectasis, suspect secondary to RA airway involvement.  ?Rheumatoid Arthritis ?Concern for MAI infection ?Multiple pulmonary nodules ? ?Plan/Recommendations: ? ?Repeat CT Chest now to follow up on nodules based on Fleischner criteria.  ?Obtain sputum samples.  ?Continue prn albuterol with nebulizer therapy.  ?No systemic signs/symptoms of MAI infection based on her report.  ? ? ?Return to Care: ?Return in about 6 months (around  03/29/2022). ? ? ?Lenice Llamas, MD ?Pulmonary and Critical Care Medicine ?[place] ?Office:[phone] ? ? ? ? ? ?

## 2021-09-28 ENCOUNTER — Other Ambulatory Visit: Payer: Self-pay | Admitting: Family Medicine

## 2021-10-12 ENCOUNTER — Other Ambulatory Visit: Payer: Self-pay | Admitting: Family Medicine

## 2021-10-12 DIAGNOSIS — M79661 Pain in right lower leg: Secondary | ICD-10-CM

## 2021-10-21 ENCOUNTER — Other Ambulatory Visit: Payer: Self-pay | Admitting: Family Medicine

## 2021-10-22 ENCOUNTER — Other Ambulatory Visit: Payer: Self-pay | Admitting: Family Medicine

## 2021-10-22 DIAGNOSIS — M069 Rheumatoid arthritis, unspecified: Secondary | ICD-10-CM

## 2021-10-22 MED ORDER — HYDROCODONE-ACETAMINOPHEN 5-325 MG PO TABS: 1.0000 | ORAL_TABLET | Freq: Four times a day (QID) | ORAL | 0 refills | Status: DC | PRN

## 2021-10-22 NOTE — Telephone Encounter (Signed)
Medication:   HYDROcodone-acetaminophen (NORCO/VICODIN) 5-325 MG tablet [[id number]][[id number]]   Has the patient contacted their pharmacy? No. (If no, request that the patient contact the pharmacy for the refill.) (If yes, when and what did the pharmacy advise?)  Preferred Pharmacy (with phone number or street name):   [id number] - [place] - [address]  [address], [place] [address]  Phone:  [phone]  Fax:  [phone]   Agent: Please be advised that RX refills may take up to 3 business days. We ask that you follow-up with your pharmacy.

## 2021-10-22 NOTE — Telephone Encounter (Signed)
Requesting: Norco 5-325 Contract: 06/11/21 UDS: 06/11/21 Last Visit: 09/13/21 Next Visit: 11/30/21 Last Refill: 07/23/21  Please Advise

## 2021-11-29 NOTE — Progress Notes (Signed)
Subjective:    Patient ID: Jamie Cox, female    DOB: 1960-09-20, 61 y.o.   MRN: [id number]  Chief Complaint  Patient presents with   Follow-up    HPI Patient is in today for follow up on chronic medical concerns. No recent febrile illness or acute hospitalizations. No falls or injury. Her work is still physical but less demanding and she is struggling less with pain and sob. They do still palgue her but she gets adequate relief from her current meds. Denies CP/palp/HA/congestion/fevers/GI or GU c/o. Taking meds as prescribed   Past Medical History:  Diagnosis Date   Allergy    Asthma    Atypical chest pain    Chronic sinusitis    Followed by Dr. Jenne Pane   Environmental allergies    GERD (gastroesophageal reflux disease)    High serum parathyroid hormone (PTH) 06/09/2016   History of chicken pox    Hypercalcemia 07/19/2015   Osteoporosis, unspecified    steroid induced   Personal history of other diseases of circulatory system    Rheumatoid arthritis(714.0)    Unspecified deficiency anemia    microcytic   Urticaria     Past Surgical History:  Procedure Laterality Date   NASAL SINUS SURGERY  06/2012   NASAL SINUS SURGERY     2010    Family History  Problem Relation Age of Onset   Liver cancer Maternal Grandmother    Hypertension Maternal Grandmother    Heart attack Maternal Grandmother    Scleroderma Mother    Heart disease Mother    Kidney disease Mother    Other Mother    Prostate cancer Father    Diabetes Father    Heart disease Father        CHF   Arthritis Father        s/p hip replacement   Cancer Father        lung cancer with mets, smoker   Lung cancer Paternal Uncle    Coronary artery disease Paternal Grandmother    Hypertension Paternal Grandmother    Liver cancer Paternal Grandmother    Hyperlipidemia Son    Alzheimer's disease Paternal Grandfather    Other Sister        Cardiomegaly   Arthritis Sister    Heart disease Sister        rare    Sleep apnea Brother    Heart disease Brother    Arthritis Daughter    Lupus Cousin    Arthritis Cousin    Colon cancer Neg Hx    Asthma Neg Hx    Allergic rhinitis Neg Hx    Eczema Neg Hx    Immunodeficiency Neg Hx    Angioedema Neg Hx     Social History   Socioeconomic History   Marital status: Married    Spouse name: Not on file   Number of children: 2   Years of education: Not on file   Highest education level: Not on file  Occupational History   Occupation: Data processing manager  Tobacco Use   Smoking status: Never   Smokeless tobacco: Never  Vaping Use   Vaping Use: Never used  Substance and Sexual Activity   Alcohol use: No    Alcohol/week: 0.0 standard drinks of alcohol   Drug use: No   Sexual activity: Not Currently  Other Topics Concern   Not on file  Social History Narrative   Not on file   Social Determinants of Health  Financial Resource Strain: Not on file  Food Insecurity: Not on file  Transportation Needs: Not on file  Physical Activity: Not on file  Stress: Not on file  Social Connections: Not on file  Intimate Partner Violence: Not on file    Outpatient Medications Prior to Visit  Medication Sig Dispense Refill   albuterol (VENTOLIN HFA) 108 (90 Base) MCG/ACT inhaler INHALE ONE TO TWO PUFFS INTO THE LUNGS EVERY 6 HOURS AS NEEDED FOR WHEEZING OR SHORTNESS OF BREATH 18 g 5   atorvastatin (LIPITOR) 10 MG tablet Take 1 tablet by mouth once daily 90 tablet 1   COVID-19 mRNA bivalent vaccine, Pfizer, (PFIZER COVID-19 VAC BIVALENT) injection Inject into the muscle. 0.3 mL 0   COVID-19 mRNA Vac-TriS, Pfizer, (PFIZER-BIONT COVID-19 VAC-TRIS) SUSP injection Inject into the muscle. 0.3 mL 0   famotidine (PEPCID) 40 MG tablet TAKE 1 TABLET BY MOUTH EVERY DAY 90 tablet 1   fexofenadine (ALLEGRA) 180 MG tablet Take 180 mg by mouth daily.     fluticasone (FLONASE) 50 MCG/ACT nasal spray Place 2 sprays into both nostrils daily. 1 g 0   gabapentin (NEURONTIN)  300 MG capsule TAKE 1 CAPSULE BY MOUTH THREE TIMES DAILY 270 capsule 0   HEMATINIC/FOLIC ACID 324-1 MG TABS TAKE 1 TABLET BY MOUTH EVERY DAY 90 tablet 1   levocetirizine (XYZAL) 5 MG tablet Take 1 tablet (5 mg total) by mouth every evening. 30 tablet 0   metoprolol succinate (TOPROL-XL) 25 MG 24 hr tablet Take 1 tablet by mouth once daily 90 tablet 1   montelukast (SINGULAIR) 10 MG tablet TAKE 1 TABLET BY MOUTH AT BEDTIME 90 tablet 1   pantoprazole (PROTONIX) 40 MG tablet Take 1 tablet by mouth once daily 90 tablet 0   predniSONE (DELTASONE) 2.5 MG tablet Take 1 tablet by mouth once daily with breakfast 30 tablet 2   sodium chloride 0.9 % nebulizer solution Take 3 mLs by nebulization in the morning and at bedtime. 90 mL 12   tiZANidine (ZANAFLEX) 4 MG tablet Take 1 tablet (4 mg total) by mouth every 6 (six) hours as needed for muscle spasms. 30 tablet 0   HYDROcodone-acetaminophen (NORCO/VICODIN) 5-325 MG tablet Take 1 tablet by mouth every 6 (six) hours as needed for moderate pain or severe pain. 40 tablet 0   meloxicam (MOBIC) 7.5 MG tablet TAKE 1 TO 2 TABLETS BY MOUTH ONCE DAILY AS NEEDED FOR PAIN 30 tablet 1   No facility-administered medications prior to visit.    Allergies  Allergen Reactions   Avelox [Moxifloxacin Hcl In Nacl] Diarrhea, Nausea And Vomiting and Other (See Comments)    Dizziness, Cold chills    Bactrim [Sulfamethoxazole-Trimethoprim] Hives   Doxycycline     Abdominal discomfort, anorexia   Sulfamethoxazole-Trimethoprim Hives   Augmentin [Amoxicillin-Pot Clavulanate] Hives, Itching and Swelling    Review of Systems  Constitutional:  Negative for fever and malaise/fatigue.  HENT:  Negative for congestion.   Eyes:  Negative for blurred vision.  Respiratory:  Negative for shortness of breath.   Cardiovascular:  Negative for chest pain, palpitations and leg swelling.  Gastrointestinal:  Negative for abdominal pain, blood in stool and nausea.  Genitourinary:  Negative  for dysuria and frequency.  Musculoskeletal:  Positive for back pain, joint pain, myalgias and neck pain. Negative for falls.  Skin:  Negative for rash.  Neurological:  Negative for dizziness, loss of consciousness and headaches.  Endo/Heme/Allergies:  Negative for environmental allergies.  Psychiatric/Behavioral:  Negative for depression. The  patient is not nervous/anxious.        Objective:    Physical Exam Constitutional:      General: She is not in acute distress.    Appearance: She is well-developed.  HENT:     Head: Normocephalic and atraumatic.  Eyes:     Conjunctiva/sclera: Conjunctivae normal.  Neck:     Thyroid: No thyromegaly.  Cardiovascular:     Rate and Rhythm: Normal rate and regular rhythm.     Heart sounds: Normal heart sounds. No murmur heard. Pulmonary:     Effort: Pulmonary effort is normal. No respiratory distress.     Breath sounds: Normal breath sounds.  Abdominal:     General: Bowel sounds are normal. There is no distension.     Palpations: Abdomen is soft. There is no mass.     Tenderness: There is no abdominal tenderness.  Musculoskeletal:     Cervical back: Neck supple.  Lymphadenopathy:     Cervical: No cervical adenopathy.  Skin:    General: Skin is warm and dry.  Neurological:     Mental Status: She is alert and oriented to person, place, and time.  Psychiatric:        Behavior: Behavior normal.     BP 130/80 (BP Location: Left Arm, Patient Position: Sitting, Cuff Size: Normal)   Pulse 84   Resp 20   Ht 5' (1.524 m)   Wt 111 lb 12.8 oz (50.7 kg)   SpO2 99%   BMI 21.83 kg/m  Wt Readings from Last 3 Encounters:  11/30/21 111 lb 12.8 oz (50.7 kg)  09/27/21 113 lb (51.3 kg)  09/13/21 113 lb (51.3 kg)    Diabetic Foot Exam - Simple   No data filed    Lab Results  Component Value Date   WBC 4.0 11/30/2021   HGB 10.8 (L) 11/30/2021   HCT 35.0 (L) 11/30/2021   PLT 159.0 11/30/2021   GLUCOSE 86 11/30/2021   CHOL 180 11/30/2021    TRIG 82.0 11/30/2021   HDL 92.30 11/30/2021   LDLCALC 72 11/30/2021   ALT 14 11/30/2021   AST 17 11/30/2021   NA 139 11/30/2021   K 4.4 11/30/2021   CL 103 11/30/2021   CREATININE 0.81 11/30/2021   BUN 11 11/30/2021   CO2 28 11/30/2021   TSH 0.94 11/30/2021   HGBA1C 6.5 11/30/2021   MICROALBUR 1.3 11/30/2021    Lab Results  Component Value Date   TSH 0.94 11/30/2021   Lab Results  Component Value Date   WBC 4.0 11/30/2021   HGB 10.8 (L) 11/30/2021   HCT 35.0 (L) 11/30/2021   MCV 75.0 (L) 11/30/2021   PLT 159.0 11/30/2021   Lab Results  Component Value Date   NA 139 11/30/2021   K 4.4 11/30/2021   CO2 28 11/30/2021   GLUCOSE 86 11/30/2021   BUN 11 11/30/2021   CREATININE 0.81 11/30/2021   BILITOT 0.3 11/30/2021   ALKPHOS 55 11/30/2021   AST 17 11/30/2021   ALT 14 11/30/2021   PROT 7.7 11/30/2021   ALBUMIN 4.2 11/30/2021   CALCIUM 9.9 11/30/2021   ANIONGAP 14 09/21/2019   GFR 77.36 11/30/2021   Lab Results  Component Value Date   CHOL 180 11/30/2021   Lab Results  Component Value Date   HDL 92.30 11/30/2021   Lab Results  Component Value Date   LDLCALC 72 11/30/2021   Lab Results  Component Value Date   TRIG 82.0 11/30/2021   Lab Results  Component Value Date   CHOLHDL 2 11/30/2021   Lab Results  Component Value Date   HGBA1C 6.5 11/30/2021       Assessment & Plan:   COLONOSCOPY: 10/2012 MAMMO: 10/2020 PAP: 02/2019 PSA: n/a DEXA: 10/2020   Problem List Items Addressed This Visit     Anemia    Slightly worse, check iron studies and ifob. Consider referral back to gastro for early colonoscopy. Increase leafy greens, consider increased lean red meat and using cast iron cookware. Continue to monitor, report any concerns      Relevant Orders   CBC (Completed)   Iron, TIBC and Ferritin Panel   Rheumatoid arthritis (HCC)    Work is less demanding and her meds have been helpful. Refills given on Meloxicam and Hydrocodone to use prn       Relevant Medications   meloxicam (MOBIC) 7.5 MG tablet   HYDROcodone-acetaminophen (NORCO/VICODIN) 5-325 MG tablet   Osteoporosis    Encouraged to get adequate exercise, calcium and vitamin d intake      Relevant Orders   VITAMIN D 25 Hydroxy (Vit-D Deficiency, Fractures) (Completed)   Tachycardia    RRR      GERD (gastroesophageal reflux disease)    Using famotidine daily and using Pantoprazole prn with good results      Preventative health care - Primary   Relevant Orders   Lipid panel (Completed)   Comprehensive metabolic panel (Completed)   Intercostal muscle pain   Relevant Orders   CK (Creatine Kinase) (Completed)   Sedimentation rate (Completed)   Hypercalcemia   High serum parathyroid hormone (PTH)    Normal on check today      Relevant Orders   PTH, intact (no Ca) (Completed)   Asthma    Her work has become physically easier so her breathing has been less of a barrier. No recent exacerbation      Controlled type 2 diabetes mellitus without complication, without long-term current use of insulin (HCC)    hgba1c acceptable, minimize simple carbs. Increase exercise as tolerated. Continue current meds      Relevant Orders   TSH (Completed)   Lipid panel (Completed)   Comprehensive metabolic panel (Completed)   CBC (Completed)   Hemoglobin A1c (Completed)   Microalbumin / creatinine urine ratio (Completed)   Other Visit Diagnoses     Paresthesias       Relevant Orders   Comprehensive metabolic panel (Completed)   Magnesium (Completed)   CK (Creatine Kinase) (Completed)   Sedimentation rate (Completed)   Pain of right lower leg       Relevant Medications   meloxicam (MOBIC) 7.5 MG tablet       I am having Grainne L. Elenbaas maintain her levocetirizine, albuterol, pantoprazole, fexofenadine, sodium chloride, Pfizer-BioNT COVID-19 Vac-TriS, Hematinic/Folic Acid, Pfizer COVID-19 Vac Bivalent, montelukast, tiZANidine, fluticasone, atorvastatin,  metoprolol succinate, predniSONE, gabapentin, famotidine, meloxicam, and HYDROcodone-acetaminophen.  Meds ordered this encounter  Medications   meloxicam (MOBIC) 7.5 MG tablet    Sig: TAKE 1 TO 2 TABLETS BY MOUTH ONCE DAILY AS NEEDED FOR PAIN    Dispense:  30 tablet    Refill:  2   HYDROcodone-acetaminophen (NORCO/VICODIN) 5-325 MG tablet    Sig: Take 1 tablet by mouth every 6 (six) hours as needed for moderate pain or severe pain.    Dispense:  40 tablet    Refill:  0

## 2021-11-30 ENCOUNTER — Ambulatory Visit (INDEPENDENT_AMBULATORY_CARE_PROVIDER_SITE_OTHER): Payer: BC Managed Care – PPO | Admitting: Family Medicine

## 2021-11-30 ENCOUNTER — Encounter: Payer: Self-pay | Admitting: Family Medicine

## 2021-11-30 VITALS — BP 130/80 | HR 84 | Resp 20 | Ht 60.0 in | Wt 111.8 lb

## 2021-11-30 DIAGNOSIS — M7918 Myalgia, other site: Secondary | ICD-10-CM | POA: Diagnosis not present

## 2021-11-30 DIAGNOSIS — R202 Paresthesia of skin: Secondary | ICD-10-CM | POA: Diagnosis not present

## 2021-11-30 DIAGNOSIS — M79661 Pain in right lower leg: Secondary | ICD-10-CM

## 2021-11-30 DIAGNOSIS — J4540 Moderate persistent asthma, uncomplicated: Secondary | ICD-10-CM

## 2021-11-30 DIAGNOSIS — E119 Type 2 diabetes mellitus without complications: Secondary | ICD-10-CM

## 2021-11-30 DIAGNOSIS — M0510 Rheumatoid lung disease with rheumatoid arthritis of unspecified site: Secondary | ICD-10-CM

## 2021-11-30 DIAGNOSIS — M069 Rheumatoid arthritis, unspecified: Secondary | ICD-10-CM

## 2021-11-30 DIAGNOSIS — R7989 Other specified abnormal findings of blood chemistry: Secondary | ICD-10-CM

## 2021-11-30 DIAGNOSIS — R000 Tachycardia, unspecified: Secondary | ICD-10-CM

## 2021-11-30 DIAGNOSIS — D649 Anemia, unspecified: Secondary | ICD-10-CM | POA: Diagnosis not present

## 2021-11-30 DIAGNOSIS — M810 Age-related osteoporosis without current pathological fracture: Secondary | ICD-10-CM

## 2021-11-30 DIAGNOSIS — K219 Gastro-esophageal reflux disease without esophagitis: Secondary | ICD-10-CM

## 2021-11-30 DIAGNOSIS — Z0000 Encounter for general adult medical examination without abnormal findings: Secondary | ICD-10-CM

## 2021-11-30 MED ORDER — MELOXICAM 7.5 MG PO TABS: ORAL_TABLET | ORAL | 2 refills | Status: DC

## 2021-11-30 MED ORDER — HYDROCODONE-ACETAMINOPHEN 5-325 MG PO TABS: 1.0000 | ORAL_TABLET | Freq: Four times a day (QID) | ORAL | 0 refills | Status: DC | PRN

## 2021-11-30 NOTE — Assessment & Plan Note (Signed)
Using famotidine daily and using Pantoprazole prn with good results

## 2021-12-01 ENCOUNTER — Other Ambulatory Visit (INDEPENDENT_AMBULATORY_CARE_PROVIDER_SITE_OTHER): Payer: BC Managed Care – PPO

## 2021-12-01 ENCOUNTER — Other Ambulatory Visit: Payer: Self-pay

## 2021-12-01 DIAGNOSIS — D649 Anemia, unspecified: Secondary | ICD-10-CM | POA: Diagnosis not present

## 2021-12-01 DIAGNOSIS — R202 Paresthesia of skin: Secondary | ICD-10-CM | POA: Insufficient documentation

## 2021-12-01 LAB — COMPREHENSIVE METABOLIC PANEL
ALT: 14 U/L (ref 0–35)
AST: 17 U/L (ref 0–37)
Albumin: 4.2 g/dL (ref 3.5–5.2)
Alkaline Phosphatase: 55 U/L (ref 39–117)
BUN: 11 mg/dL (ref 6–23)
CO2: 28 mEq/L (ref 19–32)
Calcium: 9.9 mg/dL (ref 8.4–10.5)
Chloride: 103 mEq/L (ref 96–112)
Creatinine, Ser: 0.81 mg/dL (ref 0.40–1.20)
GFR: 77.36 mL/min (ref >60.00)
Glucose, Bld: 86 mg/dL (ref 70–99)
Potassium: 4.4 mEq/L (ref 3.5–5.1)
Sodium: 139 mEq/L (ref 135–145)
Total Bilirubin: 0.3 mg/dL (ref 0.2–1.2)
Total Protein: 7.7 g/dL (ref 6.0–8.3)

## 2021-12-01 LAB — LIPID PANEL
Cholesterol: 180 mg/dL (ref 0–200)
HDL: 92.3 mg/dL (ref >39.00)
LDL Cholesterol: 72 mg/dL (ref 0–99)
NonHDL: 88.01
Total CHOL/HDL Ratio: 2
Triglycerides: 82 mg/dL (ref 0.0–149.0)
VLDL: 16.4 mg/dL (ref 0.0–40.0)

## 2021-12-01 LAB — CBC
HCT: 35 % — ABNORMAL LOW (ref 36.0–46.0)
Hemoglobin: 10.8 g/dL — ABNORMAL LOW (ref 12.0–15.0)
MCHC: 30.9 g/dL (ref 30.0–36.0)
MCV: 75 fl — ABNORMAL LOW (ref 78.0–100.0)
Platelets: 159 10*3/uL (ref 150.0–400.0)
RBC: 4.66 Mil/uL (ref 3.87–5.11)
RDW: 15.4 % (ref 11.5–15.5)
WBC: 4 10*3/uL (ref 4.0–10.5)

## 2021-12-01 LAB — MICROALBUMIN / CREATININE URINE RATIO
Creatinine,U: 102.3 mg/dL
Microalb Creat Ratio: 1.2 mg/g (ref 0.0–30.0)
Microalb, Ur: 1.3 mg/dL (ref 0.0–1.9)

## 2021-12-01 LAB — TSH: TSH: 0.94 u[IU]/mL (ref 0.35–5.50)

## 2021-12-01 LAB — MAGNESIUM: Magnesium: 1.8 mg/dL (ref 1.5–2.5)

## 2021-12-01 LAB — HEMOGLOBIN A1C: Hgb A1c MFr Bld: 6.5 % (ref 4.6–6.5)

## 2021-12-01 LAB — IBC + FERRITIN
Ferritin: 637.2 ng/mL — ABNORMAL HIGH (ref 10.0–291.0)
Iron: 107 ug/dL (ref 42–145)
Saturation Ratios: 37.3 % (ref 20.0–50.0)
TIBC: 287 ug/dL (ref 250.0–450.0)
Transferrin: 205 mg/dL — ABNORMAL LOW (ref 212.0–360.0)

## 2021-12-01 LAB — CK: Total CK: 130 U/L (ref 7–177)

## 2021-12-01 LAB — VITAMIN D 25 HYDROXY (VIT D DEFICIENCY, FRACTURES): VITD: 40.44 ng/mL (ref 30.00–100.00)

## 2021-12-01 LAB — PARATHYROID HORMONE, INTACT (NO CA): PTH: 65 pg/mL (ref 16–77)

## 2021-12-01 LAB — SEDIMENTATION RATE: Sed Rate: 61 mm/hr — ABNORMAL HIGH (ref 0–30)

## 2021-12-01 NOTE — Assessment & Plan Note (Signed)
hgba1c acceptable, minimize simple carbs. Increase exercise as tolerated. Continue current meds 

## 2021-12-01 NOTE — Assessment & Plan Note (Signed)
Work is less demanding and her meds have been helpful. Refills given on Meloxicam and Hydrocodone to use prn. Using inhalers as needed and following with pulmonology

## 2021-12-01 NOTE — Assessment & Plan Note (Signed)
Her work has become physically easier so her breathing has been less of a barrier. No recent exacerbation

## 2021-12-01 NOTE — Assessment & Plan Note (Deleted)
Work is less demanding and her meds have been helpful. Refills given on Meloxicam and Hydrocodone to use prn

## 2021-12-01 NOTE — Assessment & Plan Note (Signed)
Intermittent in both legs. Consider DDD in low spine she will notify us if symptoms worsen.

## 2021-12-01 NOTE — Assessment & Plan Note (Signed)
Slightly worse, check iron studies and ifob. Consider referral back to gastro for early colonoscopy. Increase leafy greens, consider increased lean red meat and using cast iron cookware. Continue to monitor, report any concerns

## 2021-12-01 NOTE — Assessment & Plan Note (Signed)
Normal on check today 

## 2021-12-01 NOTE — Assessment & Plan Note (Signed)
RRR 

## 2021-12-01 NOTE — Assessment & Plan Note (Signed)
Encouraged to get adequate exercise, calcium and vitamin d intake 

## 2021-12-04 NOTE — Telephone Encounter (Signed)
Prolia VOB initiated via [email or web address]  Last Prolia inj 07/09/21 Next Prolia inj due 01/07/22

## 2021-12-19 NOTE — Telephone Encounter (Signed)
Prior auth required for PROLIA  PA PROCESS DETAILS: PA is required. Providers may call Medical Utilization at [phone] to initiate. Forms may be accessed online at [email or web address] faxed back to [phone] .   

## 2021-12-20 ENCOUNTER — Other Ambulatory Visit: Payer: Self-pay | Admitting: Family Medicine

## 2021-12-20 ENCOUNTER — Ambulatory Visit (HOSPITAL_BASED_OUTPATIENT_CLINIC_OR_DEPARTMENT_OTHER)
Admission: RE | Admit: 2021-12-20 | Discharge: 2021-12-20 | Disposition: A | Discharge status: Home / Self Care | Payer: BC Managed Care – PPO | Source: Ambulatory Visit | Attending: Family Medicine | Admitting: Family Medicine

## 2021-12-20 DIAGNOSIS — R918 Other nonspecific abnormal finding of lung field: Secondary | ICD-10-CM | POA: Diagnosis present

## 2021-12-20 DIAGNOSIS — J479 Bronchiectasis, uncomplicated: Secondary | ICD-10-CM | POA: Insufficient documentation

## 2021-12-22 ENCOUNTER — Other Ambulatory Visit: Payer: Self-pay | Admitting: Family Medicine

## 2021-12-26 NOTE — Telephone Encounter (Signed)
PA# [id number]  Valid: 06/26/21-06/25/22

## 2021-12-26 NOTE — Telephone Encounter (Signed)
Pt ready for scheduling on or after 01/07/22  Out-of-pocket cost due at time of visit: $0  Primary: BCBS [place] FEP Prolia co-insurance: 0% Admin fee co-insurance: 0%  Secondary: n/a Prolia co-insurance:  Admin fee co-insurance:   Deductible: does not apply  Prior Auth: APPROVED PA# [id number]  Valid: 06/26/21-06/25/22    ** This summary of benefits is an estimation of the patient's out-of-pocket cost. Exact cost may very based on individual plan coverage.

## 2021-12-27 ENCOUNTER — Telehealth: Payer: Self-pay | Admitting: Family Medicine

## 2021-12-27 ENCOUNTER — Ambulatory Visit: Payer: BC Managed Care – PPO | Admitting: Internal Medicine

## 2021-12-27 NOTE — Telephone Encounter (Signed)
Mychart message sent.

## 2021-12-27 NOTE — Telephone Encounter (Signed)
Pt stated she needed to complete a stool sample and she would like that mailed to her house.

## 2021-12-28 NOTE — Telephone Encounter (Signed)
Kit mailed.

## 2021-12-29 ENCOUNTER — Telehealth: Payer: Self-pay | Admitting: Family Medicine

## 2021-12-29 ENCOUNTER — Other Ambulatory Visit: Payer: Self-pay

## 2021-12-29 DIAGNOSIS — J0140 Acute pansinusitis, unspecified: Secondary | ICD-10-CM

## 2021-12-29 MED ORDER — FLUTICASONE PROPIONATE 50 MCG/ACT NA SUSP: 2.0000 | Freq: Every day | NASAL | 1 refills | Status: DC

## 2021-12-29 NOTE — Telephone Encounter (Signed)
Refill sent.

## 2021-12-29 NOTE — Telephone Encounter (Signed)
Medication: fluticasone (FLONASE) 50 MCG/ACT nasal spray   Has the patient contacted their pharmacy? Yes.   (If no, request that the patient contact the pharmacy for the refill.) (If yes, when and what did the pharmacy advise?)  Preferred Pharmacy (with phone number or street name): [facility] - [place] ([place]), [place]  [address] [place] ([place]) [place] [address]  Phone:  [phone]  Fax:  [phone]   Agent: Please be advised that RX refills may take up to 3 business days. We ask that you follow-up with your pharmacy.

## 2021-12-30 ENCOUNTER — Other Ambulatory Visit: Payer: Self-pay | Admitting: Family Medicine

## 2022-01-03 ENCOUNTER — Ambulatory Visit: Payer: BC Managed Care – PPO | Admitting: Internal Medicine

## 2022-01-03 ENCOUNTER — Encounter: Payer: Self-pay | Admitting: Internal Medicine

## 2022-01-03 VITALS — BP 134/78 | HR 105 | Temp 98.0°F | Ht 60.0 in | Wt 112.6 lb

## 2022-01-03 DIAGNOSIS — R9389 Abnormal findings on diagnostic imaging of other specified body structures: Secondary | ICD-10-CM | POA: Diagnosis not present

## 2022-01-03 DIAGNOSIS — J479 Bronchiectasis, uncomplicated: Secondary | ICD-10-CM

## 2022-01-03 DIAGNOSIS — M0579 Rheumatoid arthritis with rheumatoid factor of multiple sites without organ or systems involvement: Secondary | ICD-10-CM

## 2022-01-03 NOTE — Progress Notes (Unsigned)
Jamie Cox    [id number]    [birth year]  Primary Care Physician:Blyth, Bryon Lions, MD Date of Appointment: 01/04/2022 Established Patient Visit  Chief complaint:   Chief Complaint  Patient presents with   Follow-up    Dry cough/ CT review      HPI: Jamie Cox is a 61 y.o. 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   Interval Updates: Here for follow up. Breathing feels stable.  She feels overall that her coughing is from sinus drainage.   Has not dropped off sputum cultures. She keeps forgetting to bring me sputum samples - she says she has enough to fill a spoon every couple of days. So it's not a daily amount No hemoptysis, no fevers, no weight loss. No fevers but she takes tylenol everyday for pain.   Uses albuterol prn - maybe once every other week.   RA symptoms - no fevers or rashes. Joint pains controlled on tylenol. She is on prednisone 2.5 mg daily no steroid sparing therapy. Sees Azucena Fallen NP at [facility].   Saw a sports medicine doctor for a painful ankle a couple months ago.   Denies worsening dyspnea or chest tightness.    Past Medical History:  Diagnosis Date   Allergy    Asthma    Atypical chest pain    Chronic sinusitis    Followed by Dr. Jenne Pane   Environmental allergies    GERD (gastroesophageal reflux disease)    High serum parathyroid hormone (PTH) 06/09/2016   History of chicken pox    Hypercalcemia 07/19/2015   Osteoporosis, unspecified    steroid induced   Personal history of other diseases of circulatory system    Rheumatoid arthritis(714.0)    Unspecified deficiency anemia    microcytic   Urticaria     Past Surgical History:  Procedure Laterality Date   NASAL SINUS SURGERY  06/2012   NASAL SINUS SURGERY     2010    Family History  Problem Relation Age of Onset   Liver cancer Maternal Grandmother    Hypertension Maternal Grandmother    Heart attack  Maternal Grandmother    Scleroderma Mother    Heart disease Mother    Kidney disease Mother    Other Mother    Prostate cancer Father    Diabetes Father    Heart disease Father        CHF   Arthritis Father        s/p hip replacement   Cancer Father        lung cancer with mets, smoker   Lung cancer Paternal Uncle    Coronary artery disease Paternal Grandmother    Hypertension Paternal Grandmother    Liver cancer Paternal Grandmother    Hyperlipidemia Son    Alzheimer's disease Paternal Grandfather    Other Sister        Cardiomegaly   Arthritis Sister    Heart disease Sister        rare   Sleep apnea Brother    Heart disease Brother    Arthritis Daughter    Lupus Cousin    Arthritis Cousin    Colon cancer Neg Hx    Asthma Neg Hx    Allergic rhinitis Neg Hx    Eczema Neg Hx    Immunodeficiency Neg Hx    Angioedema Neg Hx     Social History   Occupational History  Occupation: Data processing manager  Tobacco Use   Smoking status: Never   Smokeless tobacco: Never  Vaping Use   Vaping Use: Never used  Substance and Sexual Activity   Alcohol use: No    Alcohol/week: 0.0 standard drinks of alcohol   Drug use: No   Sexual activity: Not Currently     Physical Exam: Blood pressure 134/78, pulse (!) 105, temperature 98 F (36.7 C), temperature source Oral, height 5' (1.524 m), weight 112 lb 9.6 oz (51.1 kg), SpO2 96 %.  Gen:      NAD.  Lungs:    diminished, no wheezes or crackles CV:         RRR, no mrg MSK:      no acute synovitis   Data Reviewed: Imaging: I have personally reviewed the CT Chest obtained July 2023 shows stable bronchiectasis. Stable adenopathy.  PFTs:      Latest Ref Rng & Units 02/04/2021   10:11 AM 03/21/2017    9:55 AM 06/28/2016   12:58 PM 05/06/2016    1:59 PM 09/05/2014   11:06 AM  PFT Results  FVC-Pre L 1.83  2.28  1.92  1.65  2.39   FVC-Predicted Pre % 80  96  84  72  102   FVC-Post L 1.80   1.96  1.75  2.38   FVC-Predicted Post  % 78   85  76  102   Pre FEV1/FVC % % 77  71  83  84  72   Post FEV1/FCV % % 81   83  82  72   FEV1-Pre L 1.40  1.63  1.59  1.38  1.73   FEV1-Predicted Pre % 78  87  88  76  94   FEV1-Post L 1.46   1.62  1.43  1.73   DLCO uncorrected ml/min/mmHg 11.87   13.41  8.52  12.63   DLCO UNC% % 65   71  45  66   DLCO corrected ml/min/mmHg 11.87   13.14  9.62    DLCO COR %Predicted % 65   69  51    DLVA Predicted % 100   106  87  91   TLC L 3.64   3.31  3.27  4.03   TLC % Predicted % 79   74  73  90   RV % Predicted % 96   75  97  103    I have personally reviewed the patient's PFTs and they show mild restriction to ventilation, no airflow limitation, no significant response to bronchodilator. Moderately reduced diffusion capacity. Reduced lung function from 2018 to 2022  Labs: Lab Results  Component Value Date   WBC 4.0 11/30/2021   HGB 10.8 (L) 11/30/2021   HCT 35.0 (L) 11/30/2021   MCV 75.0 (L) 11/30/2021   PLT 159.0 11/30/2021   Lab Results  Component Value Date   NA 139 11/30/2021   K 4.4 11/30/2021   CL 103 11/30/2021   CO2 28 11/30/2021    Immunization status: Immunization History  Administered Date(s) Administered   Influenza Split 04/05/2011   Influenza Whole 04/10/2008, 05/15/2009, 05/21/2010, 02/05/2012   Influenza,inj,Quad PF,6+ Mos 08/06/2013, 05/09/2016, 04/10/2018   Janssen (J&J) SARS-COV-2 Vaccination 08/17/2019   PFIZER Comirnaty(Gray Top)Covid-19 Tri-Sucrose Vaccine 10/27/2020   PNEUMOCOCCAL CONJUGATE-20 09/28/2020   Pfizer Covid-19 Vaccine Bivalent Booster 76yrs & up 06/11/2021   Pneumococcal Polysaccharide-23 07/01/2010   Td 05/15/2009    Assessment:  Bronchiectasis, suspect secondary to RA airway involvement.  Rheumatoid  Arthritis, not well controlled Abnormal CT Chest with Concern for MAI infection Multiple pulmonary nodules  Plan/Recommendations:  I will request records from [facility] - Azucena Fallen. It seems she is considering  putting Ms. Cureton on higher doses of immune suppression for joint involvement.   Repeat CT Chest shows stable bronchiectasis with evolving tree in bud opacities consistent with likely atypical infection.   Continue prn albuterol  Repeat PFTs with next visit to assess for lung function. She is symptomatically stable Does not appear to have systemic signs or symptoms or MAI infection   Return to Care: Return in about 3 months (around 04/05/2022).   Durel Salts, MD Pulmonary and Critical Care Medicine [facility] [phone]

## 2022-01-03 NOTE — Patient Instructions (Addendum)
Please schedule follow up scheduled with myself in 3 months.  If my schedule is not open yet, we will contact you with a reminder closer to that time. Please call [phone] if you haven't heard from [place] a month before.   Before your next visit I would like you to have: Full set of PFTs - before next visit, see me after  Bring me sputum samples if you can.   I will request records from [facility] - Azucena Fallen.   Continue albuterol as needed.

## 2022-01-12 ENCOUNTER — Telehealth (INDEPENDENT_AMBULATORY_CARE_PROVIDER_SITE_OTHER): Payer: BC Managed Care – PPO | Admitting: Family Medicine

## 2022-01-12 ENCOUNTER — Encounter: Payer: Self-pay | Admitting: Family Medicine

## 2022-01-12 DIAGNOSIS — J324 Chronic pansinusitis: Secondary | ICD-10-CM | POA: Diagnosis not present

## 2022-01-12 DIAGNOSIS — R051 Acute cough: Secondary | ICD-10-CM | POA: Diagnosis not present

## 2022-01-12 MED ORDER — PROMETHAZINE-DM 6.25-15 MG/5ML PO SYRP: 5.0000 mL | ORAL_SOLUTION | Freq: Four times a day (QID) | ORAL | 0 refills | Status: AC | PRN

## 2022-01-12 MED ORDER — CEFDINIR 300 MG PO CAPS: 300.0000 mg | ORAL_CAPSULE | Freq: Two times a day (BID) | ORAL | 0 refills | Status: AC

## 2022-01-12 NOTE — Progress Notes (Signed)
Virtual Video Visit via MyChart Note  I connected with  Jamie Cox on 01/12/22 at 11:00 AM EDT by the video enabled telemedicine application for MyChart, and verified that I am speaking with the correct person using two identifiers.   I introduced myself as a Publishing rights manager with the practice. We discussed the limitations of evaluation and management by telemedicine and the availability of in person appointments. The patient expressed understanding and agreed to proceed.  Participating parties in this visit include: The patient and the nurse practitioner listed.  The patient is: At home I am: In the office - [facility]  Subjective:    CC: URI, cough symptoms   HPI: Jamie Cox is a 61 y.o. year old female presenting today via MyChart today for URI, cough symptoms.  Patient states that for the past 5-6 days she has had weakness, aches, sore throat, cough, rhinorrhea, sinus pressure. Sore throat, body aches, and weakness are improving, but cough and sinus pressure/drainage is persisting. She has history of asthma, chronic sinusitis. Cough is keeping her from resting. She got minimal relief with OTC night time cold/flu medicine last night. Reports cough is mostly dry. She is not having any worsening dyspnea or wheezing, fevers, chills, GI/GU symptoms. She had a negative COVID test at home today. No known sick contacts. Sinus drainage/mucus is purulent.     Past medical history, Surgical history, Family history not pertinant except as noted below, Social history, Allergies, and medications have been entered into the medical record, reviewed, and corrections made.   Review of Systems:  All review of systems negative except what is listed in the HPI   Objective:    General:  Speaking clearly in complete sentences. Absent shortness of breath noted.   Alert and oriented x3.   Normal judgment.  Absent acute distress.   Impression and  Recommendations:    1. Chronic pansinusitis 2. Acute cough Given history and severity, adding Omnicef. She is already on daily prednisone and declines adding a steroid burst - no dyspnea/wheezing. Adding Promethazine DM. Continue Mucinex, allergy medicine, PRN inhaler, etc. Continue supportive measures including rest, hydration, humidifier use, steam showers, warm compresses to sinuses, warm liquids with lemon and honey, and over-the-counter cough, cold, and analgesics as needed.   Patient aware of signs/symptoms requiring further/urgent evaluation.    - promethazine-dextromethorphan (PROMETHAZINE-DM) 6.25-15 MG/5ML syrup; Take 5 mLs by mouth 4 (four) times daily as needed for up to 6 days for cough.  Dispense: 118 mL; Refill: 0 - cefdinir (OMNICEF) 300 MG capsule; Take 1 capsule (300 mg total) by mouth 2 (two) times daily for 7 days.  Dispense: 14 capsule; Refill: 0    Follow-up if symptoms worsen or fail to improve.    I discussed the assessment and treatment plan with the patient. The patient was provided an opportunity to ask questions and all were answered. The patient agreed with the plan and demonstrated an understanding of the instructions.   The patient was advised to call back or seek an in-person evaluation if the symptoms worsen or if the condition fails to improve as anticipated.    Jamie Dana, NP

## 2022-01-12 NOTE — Progress Notes (Signed)
Covid test neg Cough mostly dry, congestion, runny nose, sore throat but getting a little better, watery eyes Taking PM cold/flu medicine, allegra, singular

## 2022-01-17 ENCOUNTER — Other Ambulatory Visit: Payer: Self-pay | Admitting: Family Medicine

## 2022-01-17 DIAGNOSIS — R053 Chronic cough: Secondary | ICD-10-CM

## 2022-01-26 ENCOUNTER — Telehealth: Payer: Self-pay

## 2022-01-26 ENCOUNTER — Other Ambulatory Visit: Payer: Self-pay | Admitting: Family Medicine

## 2022-01-26 DIAGNOSIS — R053 Chronic cough: Secondary | ICD-10-CM

## 2022-01-26 MED ORDER — PROMETHAZINE HCL 6.25 MG/5ML PO SYRP: 6.2500 mg | ORAL_SOLUTION | Freq: Every evening | ORAL | 0 refills | Status: DC | PRN

## 2022-01-26 NOTE — Telephone Encounter (Signed)
Pt was seen by Ladona Ridgel on 01/12/22- promethazine-dextromethorphan (PROMETHAZINE-DM) 6.25-15 MG/5ML syrup,   [facility] - [place] ([place]), [place] - [address]  Pt still having cough, wanting to know if she can get a refill please.

## 2022-01-26 NOTE — Telephone Encounter (Signed)
Called pt was advised  

## 2022-02-02 NOTE — Progress Notes (Unsigned)
Jamie Cox is a 61 y.o. female presents to the office today for Prolia injections, per physician's orders. Original order: " 10/28/20" :OK well with her cough and GERD she is better off not restarting it anyway. Please start the process to get her on Prolia shots twice a year if she agrees. Can use contraindication for using bisphosphanates more. " Prolia SQ, 60 mg was administered L arm posterior (location) today. Patient tolerated injection. Patient due for follow up labs/provider appt: No.  Patient next injection due: 6 months, appt made No- will schedule once new EOB has been ran in 5 months.   Creft, Feliberto Harts

## 2022-02-03 ENCOUNTER — Ambulatory Visit (INDEPENDENT_AMBULATORY_CARE_PROVIDER_SITE_OTHER): Payer: BC Managed Care – PPO

## 2022-02-03 ENCOUNTER — Telehealth: Payer: Self-pay

## 2022-02-03 DIAGNOSIS — M810 Age-related osteoporosis without current pathological fracture: Secondary | ICD-10-CM | POA: Diagnosis not present

## 2022-02-03 MED ORDER — DENOSUMAB 60 MG/ML ~~LOC~~ SOSY
60.0000 mg | PREFILLED_SYRINGE | Freq: Once | SUBCUTANEOUS | Status: AC
  Administered 2022-02-03: 60 mg via SUBCUTANEOUS

## 2022-02-03 NOTE — Telephone Encounter (Signed)
Pt received prolia injection today. Please update notebook.  

## 2022-02-03 NOTE — Telephone Encounter (Signed)
Creft, Tierra L, CMA 5 minutes ago (10:30 AM)    Pt received prolia injection today. Please update notebook.

## 2022-02-09 NOTE — Telephone Encounter (Signed)
Last Prolia inj 02/03/22 Next Prolia inj due 08/08/22

## 2022-02-14 ENCOUNTER — Other Ambulatory Visit: Payer: Self-pay | Admitting: Family Medicine

## 2022-02-16 ENCOUNTER — Other Ambulatory Visit: Payer: Self-pay | Admitting: Family Medicine

## 2022-02-22 ENCOUNTER — Other Ambulatory Visit: Payer: Self-pay | Admitting: Family Medicine

## 2022-03-08 ENCOUNTER — Other Ambulatory Visit: Payer: Self-pay | Admitting: Family Medicine

## 2022-03-08 ENCOUNTER — Telehealth: Payer: Self-pay | Admitting: Family Medicine

## 2022-03-08 DIAGNOSIS — M069 Rheumatoid arthritis, unspecified: Secondary | ICD-10-CM

## 2022-03-08 MED ORDER — HYDROCODONE-ACETAMINOPHEN 5-325 MG PO TABS
1.0000 | ORAL_TABLET | Freq: Four times a day (QID) | ORAL | 0 refills | Status: DC | PRN
Start: 2022-03-08 — End: 2022-05-03

## 2022-03-08 NOTE — Telephone Encounter (Signed)
Medication:  HYDROcodone-acetaminophen (NORCO/VICODIN) 5-325 MG tablet   Has the patient contacted their pharmacy? No.  Preferred Pharmacy (with phone number or street name):   [place] ([place]), [place] - [address] [facility] ([place]) [place] [address] Phone: [phone]  Fax: [phone]

## 2022-03-11 ENCOUNTER — Other Ambulatory Visit: Payer: Self-pay | Admitting: *Deleted

## 2022-03-11 DIAGNOSIS — J479 Bronchiectasis, uncomplicated: Secondary | ICD-10-CM

## 2022-03-11 MED ORDER — SODIUM CHLORIDE 0.9 % IN NEBU: 3.0000 mL | INHALATION_SOLUTION | Freq: Two times a day (BID) | RESPIRATORY_TRACT | 12 refills | Status: DC

## 2022-03-25 ENCOUNTER — Other Ambulatory Visit: Payer: Self-pay | Admitting: Family Medicine

## 2022-03-27 ENCOUNTER — Other Ambulatory Visit: Payer: Self-pay | Admitting: Family Medicine

## 2022-03-27 DIAGNOSIS — M79661 Pain in right lower leg: Secondary | ICD-10-CM

## 2022-03-28 ENCOUNTER — Other Ambulatory Visit: Payer: Self-pay | Admitting: Family Medicine

## 2022-03-30 ENCOUNTER — Encounter: Payer: Self-pay | Admitting: Internal Medicine

## 2022-03-30 ENCOUNTER — Ambulatory Visit (INDEPENDENT_AMBULATORY_CARE_PROVIDER_SITE_OTHER): Payer: BC Managed Care – PPO | Admitting: Internal Medicine

## 2022-03-30 VITALS — BP 118/76 | HR 78 | Temp 97.8°F | Ht 61.0 in | Wt 107.0 lb

## 2022-03-30 DIAGNOSIS — R053 Chronic cough: Secondary | ICD-10-CM

## 2022-03-30 DIAGNOSIS — J479 Bronchiectasis, uncomplicated: Secondary | ICD-10-CM | POA: Diagnosis not present

## 2022-03-30 DIAGNOSIS — R0602 Shortness of breath: Secondary | ICD-10-CM

## 2022-03-30 DIAGNOSIS — M069 Rheumatoid arthritis, unspecified: Secondary | ICD-10-CM | POA: Diagnosis not present

## 2022-03-30 LAB — PULMONARY FUNCTION TEST
DL/VA % pred: 90 %
DL/VA: 3.89 ml/min/mmHg/L
DLCO cor % pred: 65 %
DLCO cor: 11.77 ml/min/mmHg
DLCO unc % pred: 65 %
DLCO unc: 11.77 ml/min/mmHg
FEF 25-75 Post: 1.26 L/sec
FEF 25-75 Pre: 1.1 L/sec
FEF2575-%Change-Post: 15 %
FEF2575-%Pred-Post: 61 %
FEF2575-%Pred-Pre: 53 %
FEV1-%Change-Post: 4 %
FEV1-%Pred-Post: 66 %
FEV1-%Pred-Pre: 63 %
FEV1-Post: 1.46 L
FEV1-Pre: 1.4 L
FEV1FVC-%Change-Post: 1 %
FEV1FVC-%Pred-Pre: 98 %
FEV6-%Change-Post: 3 %
FEV6-%Pred-Post: 68 %
FEV6-%Pred-Pre: 66 %
FEV6-Post: 1.88 L
FEV6-Pre: 1.82 L
FEV6FVC-%Pred-Post: 104 %
FEV6FVC-%Pred-Pre: 104 %
FVC-%Change-Post: 2 %
FVC-%Pred-Post: 65 %
FVC-%Pred-Pre: 63 %
FVC-Post: 1.88 L
FVC-Pre: 1.82 L
Post FEV1/FVC ratio: 78 %
Post FEV6/FVC ratio: 100 %
Pre FEV1/FVC ratio: 77 %
Pre FEV6/FVC Ratio: 100 %
RV % pred: 64 %
RV: 1.22 L
TLC % pred: 72 %
TLC: 3.34 L

## 2022-03-30 NOTE — Progress Notes (Signed)
Jamie Cox    [id number]    Jun 03, 1961  Primary Care Physician:Blyth, Bonnita Levan, MD Date of Appointment: 03/30/2022 Established Patient Visit  Chief complaint:   Chief Complaint  Patient presents with   Follow-up    Cough worse x 2 weeks.  Disucss PFT from today.  Discuss getting flu vaccine today     HPI: Jamie Cox is a 61 y.o. 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   Interval Updates: Here for follow up after PFTs. FVC, DLCO stable from 5 years ago. No worsening dyspnea.   Still has intermittent cough from sinus drainage. Takes flonase and this helps as well as mucinex. Also on singulair and xyzal.   No fevers chills, night sweats weight loss.  Has not been able to bring up sputum from her lungs - feels its all nasal drainage  RA symptoms - no fevers or rashes. Joint pains controlled on tylenol. She is on prednisone 2.5 mg daily no steroid sparing therapy. Sees Leafy Kindle NP at [facility].    Past Medical History:  Diagnosis Date   Allergy    Asthma    Atypical chest pain    Chronic sinusitis    Followed by Dr. Redmond Baseman   Environmental allergies    GERD (gastroesophageal reflux disease)    High serum parathyroid hormone (PTH) 06/09/2016   History of chicken pox    Hypercalcemia 07/19/2015   Osteoporosis, unspecified    steroid induced   Personal history of other diseases of circulatory system    Rheumatoid arthritis(714.0)    Unspecified deficiency anemia    microcytic   Urticaria     Past Surgical History:  Procedure Laterality Date   NASAL SINUS SURGERY  06/2012   NASAL SINUS SURGERY     2010    Family History  Problem Relation Age of Onset   Liver cancer Maternal Grandmother    Hypertension Maternal Grandmother    Heart attack Maternal Grandmother    Scleroderma Mother    Heart disease Mother    Kidney disease Mother    Other Mother    Prostate cancer Father     Diabetes Father    Heart disease Father        CHF   Arthritis Father        s/p hip replacement   Cancer Father        lung cancer with mets, smoker   Lung cancer Paternal Uncle    Coronary artery disease Paternal Grandmother    Hypertension Paternal Grandmother    Liver cancer Paternal Grandmother    Hyperlipidemia Son    Alzheimer's disease Paternal Grandfather    Other Sister        Cardiomegaly   Arthritis Sister    Heart disease Sister        rare   Sleep apnea Brother    Heart disease Brother    Arthritis Daughter    Lupus Cousin    Arthritis Cousin    Colon cancer Neg Hx    Asthma Neg Hx    Allergic rhinitis Neg Hx    Eczema Neg Hx    Immunodeficiency Neg Hx    Angioedema Neg Hx     Social History   Occupational History   Occupation: Building control surveyor  Tobacco Use   Smoking status: Never   Smokeless tobacco: Never  Vaping Use   Vaping Use: Never used  Substance  and Sexual Activity   Alcohol use: No    Alcohol/week: 0.0 standard drinks of alcohol   Drug use: No   Sexual activity: Not Currently     Physical Exam: Blood pressure 118/76, pulse 78, temperature 97.8 F (36.6 C), temperature source Oral, height 5\' 1"  (1.549 m), weight 107 lb (48.5 kg).  Gen:      NAD.  Lungs:    no wheezes or crackles or rhonchi CV:      RRR no mrg MSK:      no acute synovitis   Data Reviewed: Imaging: I have personally reviewed the CT Chest obtained July 2023 shows stable bronchiectasis. Stable adenopathy.  PFTs:     Latest Ref Rng & Units 03/30/2022    9:36 AM 02/04/2021   10:11 AM 03/21/2017    9:55 AM 06/28/2016   12:58 PM 05/06/2016    1:59 PM 09/05/2014   11:06 AM  PFT Results  FVC-Pre L 1.82  P 1.83  2.28  1.92  1.65  2.39   FVC-Predicted Pre % 63  P 80  96  84  72  102   FVC-Post L 1.88  P 1.80   1.96  1.75  2.38   FVC-Predicted Post % 65  P 78   85  76  102   Pre FEV1/FVC % % 77  P 77  71  83  84  72   Post FEV1/FCV % % 78  P 81   83  82  72    FEV1-Pre L 1.40  P 1.40  1.63  1.59  1.38  1.73   FEV1-Predicted Pre % 63  P 78  87  88  76  94   FEV1-Post L 1.46  P 1.46   1.62  1.43  1.73   DLCO uncorrected ml/min/mmHg 11.77  P 11.87   13.41  8.52  12.63   DLCO UNC% % 65  P 65   71  45  66   DLCO corrected ml/min/mmHg 11.77  P 11.87   13.14  9.62    DLCO COR %Predicted % 65  P 65   69  51    DLVA Predicted % 90  P 100   106  87  91   TLC L 3.34  P 3.64   3.31  3.27  4.03   TLC % Predicted % 72  P 79   74  73  90   RV % Predicted % 64  P 96   75  97  103     P Preliminary result   I have personally reviewed the patient's PFTs and they show mild restriction to ventilation, no airflow limitation, no significant response to bronchodilator. Moderately reduced diffusion capacity. Reduced lung function from 2018 to 2022  Labs: Lab Results  Component Value Date   WBC 4.0 11/30/2021   HGB 10.8 (L) 11/30/2021   HCT 35.0 (L) 11/30/2021   MCV 75.0 (L) 11/30/2021   PLT 159.0 11/30/2021   Lab Results  Component Value Date   NA 139 11/30/2021   K 4.4 11/30/2021   CL 103 11/30/2021   CO2 28 11/30/2021    Immunization status: Immunization History  Administered Date(s) Administered   Influenza Split 04/05/2011   Influenza Whole 04/10/2008, 05/15/2009, 05/21/2010, 02/05/2012   Influenza,inj,Quad PF,6+ Mos 08/06/2013, 05/09/2016, 04/10/2018   Janssen (J&J) SARS-COV-2 Vaccination 08/17/2019   PFIZER Comirnaty(Gray Top)Covid-19 Tri-Sucrose Vaccine 10/27/2020   PNEUMOCOCCAL CONJUGATE-20 09/28/2020   Pfizer Covid-19 Vaccine Bivalent  Booster 8yrs & up 06/11/2021   Pneumococcal Polysaccharide-23 07/01/2010   Td 05/15/2009    Assessment:  Bronchiectasis, suspect secondary to RA airway involvement.  Rheumatoid Arthritis, not well controlled Abnormal CT Chest with Concern for MAI infection Multiple pulmonary nodules  Plan/Recommendations: PFTs stable CT Chest continues to be concerning for atypical infection, but she is  asymptomatic. She is not able to bring up sputum. Would consider bronchoscopy with TBBX and BAL if she becomes symptomatic or has worsening lung function.   Come see me sooner if you developing shortness of breath, fevers, chills, night sweats or weight loss.  If your coughing gets worse and with more mucus please come see [place] and bring [place] sputum cultures.   Continue albuterol inhaler as needed for shortness of breath.   Continue your flonase, montelukast, xyzal for sinuses. You can take nasal saline spray in between to help with coughing related to sinus drainage.   Return to Care: Return in about 6 months (around 09/29/2022).   Durel Salts, MD Pulmonary and Critical Care Medicine [facility] Office:[phone]

## 2022-03-30 NOTE — Patient Instructions (Addendum)
Please schedule follow up scheduled with myself in 6 months.  If my schedule is not open yet, we will contact you with a reminder closer to that time. Please call [phone] if you haven't heard from [place] a month before.   Your lung function is stable.  Come see me sooner if you developing shortness of breath, fevers, chills, night sweats or weight loss.  If your coughing gets worse and with more mucus please come see [place] and bring [place] sputum cultures.   Continue albuterol inhaler as needed for shortness of breath.   Continue your flonase, montelukast, xyzal for sinuses. You can take nasal saline spray in between to help with coughing related to sinus drainage.

## 2022-03-30 NOTE — Progress Notes (Signed)
Full PFT performed today. °

## 2022-03-30 NOTE — Progress Notes (Signed)
118 [id number]  

## 2022-03-30 NOTE — Patient Instructions (Signed)
Full PFT performed today. °

## 2022-04-12 ENCOUNTER — Telehealth (INDEPENDENT_AMBULATORY_CARE_PROVIDER_SITE_OTHER): Payer: BC Managed Care – PPO | Admitting: Family

## 2022-04-12 DIAGNOSIS — H6692 Otitis media, unspecified, left ear: Secondary | ICD-10-CM | POA: Diagnosis not present

## 2022-04-12 DIAGNOSIS — J40 Bronchitis, not specified as acute or chronic: Secondary | ICD-10-CM

## 2022-04-12 MED ORDER — BENZONATATE 100 MG PO CAPS: 100.0000 mg | ORAL_CAPSULE | Freq: Three times a day (TID) | ORAL | 0 refills | Status: DC | PRN

## 2022-04-12 MED ORDER — AZITHROMYCIN 250 MG PO TABS: ORAL_TABLET | ORAL | 0 refills | Status: AC

## 2022-04-12 NOTE — Assessment & Plan Note (Signed)
New. Rx with zpak. Pt is pen allergic.

## 2022-04-12 NOTE — Assessment & Plan Note (Signed)
Rx with zpak, also tessalon prn. Pt is advised to schedule an in person visit if symptoms worsen or if symptoms fail to improve.

## 2022-04-12 NOTE — Progress Notes (Signed)
MyChart Video Visit    Virtual Visit via Video Note   This visit type was conducted due to national recommendations for restrictions regarding the COVID-19 Pandemic (e.g. social distancing) in an effort to limit this patient's exposure and mitigate transmission in our community. This patient is at least at moderate risk for complications without adequate follow up. This format is felt to be most appropriate for this patient at this time. Physical exam was limited by quality of the video and audio technology used for the visit. CMA was able to get the patient set up on a video visit.  Patient location: Home Patient and provider in visit Provider location: Office  I discussed the limitations of evaluation and management by telemedicine and the availability of in person appointments. The patient expressed understanding and agreed to proceed.  Visit Date: 04/12/2022  Today's healthcare provider: Lemont Fillers, NP     Subjective:    Patient ID: Jamie Cox, female    DOB: 17-Jun-1960, 61 y.o.   MRN: [id number]  Chief Complaint  Patient presents with   Cough    Persistent cough, sob, for about 2 weeks. Tested negative for covid about 2 weeks go   Ear Pain    Complains of ear pain for 2 weeks    Cough Associated symptoms include ear pain (Left).   Patient is in today for a virtual office visit  Earache/Cough: Patient complains of an earache in her left ear and a persistent cough. She reports that the earache appeared a couple of weeks ago and a cough a couple of weeks ago. She has taken OTC sinus and AM/PM cold medications and Mucinex. She reports that the cold medication helps some, especially during the evenings because it helps her sleep. She notes a history of asthma and and has been using an inhaler. She states that the inhaler does not help as much as it used to. She has been taking a small dosage of 2.5 mg of Prednisone. Her breathing does not feel tight. She took a  Covid test at the beginning of her symptoms and reports a negative result. She is interested in receiving a doctor's note for work  Past Medical History:  Diagnosis Date   Allergy    Asthma    Atypical chest pain    Chronic sinusitis    Followed by Dr. Jenne Pane   Environmental allergies    GERD (gastroesophageal reflux disease)    High serum parathyroid hormone (PTH) 06/09/2016   History of chicken pox    Hypercalcemia 07/19/2015   Osteoporosis, unspecified    steroid induced   Personal history of other diseases of circulatory system    Rheumatoid arthritis(714.0)    Unspecified deficiency anemia    microcytic   Urticaria     Past Surgical History:  Procedure Laterality Date   NASAL SINUS SURGERY  06/2012   NASAL SINUS SURGERY     2010    Family History  Problem Relation Age of Onset   Liver cancer Maternal Grandmother    Hypertension Maternal Grandmother    Heart attack Maternal Grandmother    Scleroderma Mother    Heart disease Mother    Kidney disease Mother    Other Mother    Prostate cancer Father    Diabetes Father    Heart disease Father        CHF   Arthritis Father        s/p hip replacement   Cancer Father  lung cancer with mets, smoker   Lung cancer Paternal Uncle    Coronary artery disease Paternal Grandmother    Hypertension Paternal Grandmother    Liver cancer Paternal Grandmother    Hyperlipidemia Son    Alzheimer's disease Paternal Grandfather    Other Sister        Cardiomegaly   Arthritis Sister    Heart disease Sister        rare   Sleep apnea Brother    Heart disease Brother    Arthritis Daughter    Lupus Cousin    Arthritis Cousin    Colon cancer Neg Hx    Asthma Neg Hx    Allergic rhinitis Neg Hx    Eczema Neg Hx    Immunodeficiency Neg Hx    Angioedema Neg Hx     Social History   Socioeconomic History   Marital status: Married    Spouse name: Not on file   Number of children: 2   Years of education: Not on file    Highest education level: Not on file  Occupational History   Occupation: Building control surveyor  Tobacco Use   Smoking status: Never   Smokeless tobacco: Never  Vaping Use   Vaping Use: Never used  Substance and Sexual Activity   Alcohol use: No    Alcohol/week: 0.0 standard drinks of alcohol   Drug use: No   Sexual activity: Not Currently  Other Topics Concern   Not on file  Social History Narrative   Not on file   Social Determinants of Health   Financial Resource Strain: Not on file  Food Insecurity: Not on file  Transportation Needs: Not on file  Physical Activity: Not on file  Stress: Not on file  Social Connections: Not on file  Intimate Partner Violence: Not on file    Outpatient Medications Prior to Visit  Medication Sig Dispense Refill   albuterol (VENTOLIN HFA) 108 (90 Base) MCG/ACT inhaler INHALE ONE TO TWO PUFFS INTO THE LUNGS EVERY 6 HOURS AS NEEDED FOR WHEEZING OR SHORTNESS OF BREATH 18 each 5   atorvastatin (LIPITOR) 10 MG tablet Take 1 tablet (10 mg total) by mouth daily. 90 tablet 1   COVID-19 mRNA bivalent vaccine, Pfizer, (PFIZER COVID-19 VAC BIVALENT) injection Inject into the muscle. 0.3 mL 0   COVID-19 mRNA Vac-TriS, Pfizer, (PFIZER-BIONT COVID-19 VAC-TRIS) SUSP injection Inject into the muscle. 0.3 mL 0   famotidine (PEPCID) 40 MG tablet TAKE 1 TABLET BY MOUTH EVERY DAY (Patient taking differently: Take 40 mg by mouth at bedtime.) 90 tablet 1   fexofenadine (ALLEGRA) 180 MG tablet Take 180 mg by mouth daily.     fluticasone (FLONASE) 50 MCG/ACT nasal spray Place 2 sprays into both nostrils daily. 1 g 1   gabapentin (NEURONTIN) 300 MG capsule Take 1 capsule (300 mg total) by mouth 3 (three) times daily. 270 capsule 1   HEMATINIC/FOLIC ACID 557-3 MG TABS TAKE 1 TABLET BY MOUTH EVERY DAY 90 tablet 1   HYDROcodone-acetaminophen (NORCO/VICODIN) 5-325 MG tablet Take 1 tablet by mouth every 6 (six) hours as needed for moderate pain or severe pain. 40 tablet 0    levocetirizine (XYZAL) 5 MG tablet Take 1 tablet (5 mg total) by mouth every evening. 30 tablet 0   meloxicam (MOBIC) 7.5 MG tablet TAKE 1 TO 2 TABLETS BY MOUTH ONCE DAILY AS NEEDED FOR PAIN 30 tablet 2   metoprolol succinate (TOPROL-XL) 25 MG 24 hr tablet Take 1 tablet by mouth once  daily 90 tablet 0   montelukast (SINGULAIR) 10 MG tablet TAKE 1 TABLET BY MOUTH AT BEDTIME 90 tablet 1   pantoprazole (PROTONIX) 40 MG tablet Take 1 tablet by mouth once daily 90 tablet 0   predniSONE (DELTASONE) 2.5 MG tablet Take 1 tablet by mouth once daily with breakfast 30 tablet 0   promethazine (PHENERGAN) 6.25 MG/5ML syrup Take 5 mLs (6.25 mg total) by mouth at bedtime as needed (Cough). 120 mL 0   sodium chloride 0.9 % nebulizer solution Take 3 mLs by nebulization in the morning and at bedtime. 90 mL 12   tiZANidine (ZANAFLEX) 4 MG tablet Take 1 tablet (4 mg total) by mouth every 6 (six) hours as needed for muscle spasms. 30 tablet 0   No facility-administered medications prior to visit.    Allergies  Allergen Reactions   Avelox [Moxifloxacin Hcl In Nacl] Diarrhea, Nausea And Vomiting and Other (See Comments)    Dizziness, Cold chills    Bactrim [Sulfamethoxazole-Trimethoprim] Hives   Doxycycline     Abdominal discomfort, anorexia   Sulfamethoxazole-Trimethoprim Hives   Augmentin [Amoxicillin-Pot Clavulanate] Hives, Itching and Swelling    Review of Systems  HENT:  Positive for ear pain (Left).   Respiratory:  Positive for cough.        Objective:    Physical Exam  There were no vitals taken for this visit. Wt Readings from Last 3 Encounters:  03/30/22 107 lb (48.5 kg)  01/03/22 112 lb 9.6 oz (51.1 kg)  11/30/21 111 lb 12.8 oz (50.7 kg)   Gen: Awake, alert, no acute distress Resp: Breathing is even and non-labored Psych: calm/pleasant demeanor Neuro: Alert and Oriented x 3, + facial symmetry, speech is clear.  Assessment & Plan:   Problem List Items Addressed This Visit        Unprioritized   Left otitis media - Primary    New. Rx with zpak. Pt is pen allergic.       Relevant Medications   azithromycin (ZITHROMAX) 250 MG tablet   Bronchitis    Rx with zpak, also tessalon prn. Pt is advised to schedule an in person visit if symptoms worsen or if symptoms fail to improve.      Meds ordered this encounter  Medications   azithromycin (ZITHROMAX) 250 MG tablet    Sig: Take 2 tablets on day 1, then 1 tablet daily on days 2 through 5    Dispense:  6 tablet    Refill:  0    Order Specific Question:   Supervising Provider    Answer:   Danise Edge A [[id number]]   benzonatate (TESSALON) 100 MG capsule    Sig: Take 1 capsule (100 mg total) by mouth 3 (three) times daily as needed.    Dispense:  20 capsule    Refill:  0    Order Specific Question:   Supervising Provider    Answer:   Danise Edge A [[id number]]    I discussed the assessment and treatment plan with the patient. The patient was provided an opportunity to ask questions and all were answered. The patient agreed with the plan and demonstrated an understanding of the instructions.   The patient was advised to call back or seek an in-person evaluation if the symptoms worsen or if the condition fails to improve as anticipated.  I provided 20 minutes of face-to-face time during this encounter.   I,Amber Collins,acting as a Neurosurgeon for [employer], NP.,have documented all relevant documentation on the  behalf of Lemont Fillers, NP,as directed by  Lemont Fillers, NP while in the presence of Lemont Fillers, NP.   Lemont Fillers, NP [employer] [facility] [employer] [phone] (phone) [phone] (fax)  [facility]

## 2022-04-30 ENCOUNTER — Other Ambulatory Visit: Payer: Self-pay | Admitting: Family Medicine

## 2022-05-03 ENCOUNTER — Other Ambulatory Visit: Payer: Self-pay | Admitting: Family Medicine

## 2022-05-03 ENCOUNTER — Telehealth: Payer: Self-pay | Admitting: *Deleted

## 2022-05-03 DIAGNOSIS — M069 Rheumatoid arthritis, unspecified: Secondary | ICD-10-CM

## 2022-05-03 MED ORDER — HYDROCODONE-ACETAMINOPHEN 5-325 MG PO TABS: 1.0000 | ORAL_TABLET | Freq: Four times a day (QID) | ORAL | 0 refills | Status: DC | PRN

## 2022-05-03 NOTE — Telephone Encounter (Signed)
Patient called for refill on hydrocodone 5/325mg .  Pharmacy [facility]

## 2022-05-03 NOTE — Telephone Encounter (Signed)
Requesting: hydrocodone 5/325mg  Contract:   UDS: 06/11/21 Last Visit: 11/30/21 Next Visit: 07/14/22 Last Refill: 03/08/22  Please Advise

## 2022-05-06 ENCOUNTER — Telehealth: Payer: Self-pay | Admitting: *Deleted

## 2022-05-06 NOTE — Telephone Encounter (Signed)
Prior auth started via cover my meds.  Awaiting determination.  Key: [id number]

## 2022-05-09 NOTE — Telephone Encounter (Signed)
As long as you remain covered by the [facility] and there are no changes to your plan benefits, this request is approved for the following time period: 05/06/2022 - 05/07/2023

## 2022-06-13 ENCOUNTER — Encounter: Payer: BC Managed Care – PPO | Admitting: Family Medicine

## 2022-06-21 ENCOUNTER — Other Ambulatory Visit: Payer: Self-pay | Admitting: Family Medicine

## 2022-06-21 DIAGNOSIS — J0140 Acute pansinusitis, unspecified: Secondary | ICD-10-CM

## 2022-06-26 ENCOUNTER — Other Ambulatory Visit: Payer: Self-pay | Admitting: Family Medicine

## 2022-06-26 DIAGNOSIS — M79661 Pain in right lower leg: Secondary | ICD-10-CM

## 2022-06-27 ENCOUNTER — Ambulatory Visit: Payer: BC Managed Care – PPO | Admitting: Podiatry

## 2022-06-27 ENCOUNTER — Encounter: Payer: Self-pay | Admitting: Podiatry

## 2022-06-27 VITALS — BP 124/79

## 2022-06-27 DIAGNOSIS — M79671 Pain in right foot: Secondary | ICD-10-CM

## 2022-06-27 DIAGNOSIS — B351 Tinea unguium: Secondary | ICD-10-CM

## 2022-06-27 DIAGNOSIS — Q828 Other specified congenital malformations of skin: Secondary | ICD-10-CM

## 2022-06-27 DIAGNOSIS — M79676 Pain in unspecified toe(s): Secondary | ICD-10-CM

## 2022-06-27 DIAGNOSIS — M069 Rheumatoid arthritis, unspecified: Secondary | ICD-10-CM

## 2022-06-27 DIAGNOSIS — D84821 Immunodeficiency due to drugs: Secondary | ICD-10-CM

## 2022-06-27 DIAGNOSIS — Z7952 Long term (current) use of systemic steroids: Secondary | ICD-10-CM

## 2022-06-27 DIAGNOSIS — M79672 Pain in left foot: Secondary | ICD-10-CM | POA: Diagnosis not present

## 2022-06-27 DIAGNOSIS — L84 Corns and callosities: Secondary | ICD-10-CM | POA: Diagnosis not present

## 2022-06-27 NOTE — Progress Notes (Signed)
  Subjective:  Patient ID: Jamie Cox, female    DOB: [birth year],  MRN: [id number]  Jamie Cox presents to clinic today for at risk foot care with h/o RA on immunosuppressive therapy and callus(es) right lower extremity, porokeratotic lesion(s) b/l lower extremities, and painful mycotic nails. Painful toenails interfere with ambulation. Aggravating factors include wearing enclosed shoe gear. Pain is relieved with periodic professional debridement. Painful callus(es) and porokeratotic lesion(s) are aggravated when weightbearing with and without shoegear. Pain is relieved with periodic professional debridement.  Chief Complaint  Patient presents with   Nail Problem    [facility] PCP-Blyth, Stacy PCP VST-3 months ago   New problem(s): None.   PCP is Mosie Lukes, MD.  Allergies  Allergen Reactions   Avelox [Moxifloxacin Hcl In Nacl] Diarrhea, Nausea And Vomiting and Other (See Comments)    Dizziness, Cold chills    Bactrim [Sulfamethoxazole-Trimethoprim] Hives   Doxycycline     Abdominal discomfort, anorexia   Sulfamethoxazole-Trimethoprim Hives   Augmentin [Amoxicillin-Pot Clavulanate] Hives, Itching and Swelling    Review of Systems: Negative except as noted in the HPI.  Objective: No changes noted in today's physical examination. Vitals:   06/27/22 0924  BP: 124/79   Jamie Cox is a pleasant 62 y.o. female WD, WN in NAD. AAO x 3. Vascular:  Capillary refill time to digits immediate b/l. Palpable pedal pulses b/l LE. Pedal hair present b/l. Lower extremity skin temperature gradient within normal limits. No pain with calf compression b/l. No edema noted b/l lower extremities.   Dermatological:  Pedal skin with normal turgor, texture and tone bilaterally. No open wounds bilaterally. No interdigital macerations bilaterally. .  Toenails 1-5 b/l elongated, discolored, dystrophic, thickened, crumbly with subungual debris and tenderness to dorsal  palpation.  Hyperkeratotic lesion(s) medial IPJ right great toe. No erythema, no edema, no drainage, no fluctuance noted.  Porokeratotic lesion(s) submet head 3 b/l and submet head 5 left foot. No erythema, no edema, no drainage, no fluctuance.  Musculoskeletal:  Normal muscle strength 5/5 to all lower extremity muscle groups bilaterally. No pain crepitus or joint limitation noted with ROM b/l. Hallux valgus with bunion deformity noted b/l lower extremities. Hammertoes noted to the 2-5 bilaterally.  Neurological:  Protective sensation intact 5/5 intact bilaterally with 10g monofilament b/l. Vibratory sensation intact b/l. Clonus negative b/l.  Assessment/Plan: 1. Pain due to onychomycosis of toenail   2. Porokeratosis   3. Callus   4. Pain in both feet   5. 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 Rheumatoid arthritis, involving unspecified site, unspecified whether rheumatoid factor present [facility])     -Patient was evaluated and treated. All patient's and/or POA's questions/concerns answered on today's visit. -Patient to continue soft, supportive shoe gear daily. -Toenails 1-5 b/l were debrided in length and girth with sterile nail nippers and dremel without iatrogenic bleeding.  -Callus(es) right great toe pared utilizing sterile scalpel blade without complication or incident. Total number debrided =1. -Porokeratotic lesion(s) submet head 3 b/l and submet head 5 left foot pared and enucleated with sterile currette without incident. Total number of lesions debrided=3. -Patient/POA to call should there be question/concern in the interim.   Return in about 3 months (around 09/26/2022).  Marzetta Board, DPM

## 2022-06-30 ENCOUNTER — Other Ambulatory Visit: Payer: Self-pay | Admitting: Family Medicine

## 2022-07-02 ENCOUNTER — Ambulatory Visit (HOSPITAL_COMMUNITY)
Admission: EM | Admit: 2022-07-02 | Discharge: 2022-07-02 | Disposition: A | Discharge status: Home / Self Care | Payer: BC Managed Care – PPO | Attending: Internal Medicine | Admitting: Internal Medicine

## 2022-07-02 ENCOUNTER — Ambulatory Visit (INDEPENDENT_AMBULATORY_CARE_PROVIDER_SITE_OTHER): Payer: BC Managed Care – PPO

## 2022-07-02 ENCOUNTER — Encounter (HOSPITAL_COMMUNITY): Payer: Self-pay

## 2022-07-02 DIAGNOSIS — M79644 Pain in right finger(s): Secondary | ICD-10-CM | POA: Diagnosis not present

## 2022-07-02 DIAGNOSIS — W2105XA Struck by basketball, initial encounter: Secondary | ICD-10-CM | POA: Diagnosis not present

## 2022-07-02 DIAGNOSIS — S61216A Laceration without foreign body of right little finger without damage to nail, initial encounter: Secondary | ICD-10-CM | POA: Diagnosis not present

## 2022-07-02 NOTE — Discharge Instructions (Addendum)
Keep area clean and dry.  Recommend ibuprofen as needed for discomfort.

## 2022-07-02 NOTE — ED Triage Notes (Signed)
Pt states she injured her right pinky finger after blocking a basketball today, Pain is 3/10.

## 2022-07-04 ENCOUNTER — Other Ambulatory Visit: Payer: Self-pay | Admitting: Family Medicine

## 2022-07-04 DIAGNOSIS — S61216A Laceration without foreign body of right little finger without damage to nail, initial encounter: Secondary | ICD-10-CM | POA: Diagnosis not present

## 2022-07-04 NOTE — ED Provider Notes (Signed)
Jamie Cox    CSN: [id number] Arrival date & time: 07/02/22  1736      History   Chief Complaint Chief Complaint  Patient presents with  . Finger Injury    HPI Jamie Cox is a 62 y.o. female.   Patient complains of right small finger pain that started earlier today.  She reports she was at her grandson's basketball game when she went to stop a stray basketball and felt immediate onset of right small finger pain.  She reports noticed some bleeding but was unsure what that her finger.  She reports old fracture to this finger many years ago.  She has minimal range of motion of this finger due to fractures.  She has wound wrapped which is still bleeding.   Past Medical History:  Diagnosis Date  . Allergy   . Asthma   . Atypical chest pain   . Chronic sinusitis    Followed by Dr. Redmond Baseman  . Environmental allergies   . GERD (gastroesophageal reflux disease)   . 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 History of chicken pox   . Hypercalcemia 07/19/2015  . Osteoporosis, unspecified    steroid induced  . Personal history of other diseases of circulatory system   . Rheumatoid arthritis(714.0)   . Unspecified deficiency anemia    microcytic  . Urticaria     Patient Active Problem List   Diagnosis Date Noted  . Left otitis media 04/12/2022  . Bronchitis 04/12/2022  . Paresthesias 12/01/2021  . Peroneal tendinitis of right lower extremity 07/09/2021  . Leg length discrepancy 07/09/2021  . Controlled type 2 diabetes mellitus without complication, without long-term current use of insulin ([place]) 09/28/2020  . Chronic rhinitis 09/23/2020  . Bilateral impacted cerumen 08/03/2020  . COVID-19 09/19/2019  . Atypical chest pain 08/09/2017  . Onychomycosis 05/08/2017  . Cough variant asthma with possible UACS component  05/05/2017  . Asthma 02/13/2017  . Allergy 08/03/2016  . Maxillary sinusitis, chronic 08/03/2016  . Bronchiectasis without acute  exacerbation ([place]) 06/29/2016  . 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 Cervical cancer screening 03/04/2016  . Hypercalcemia 07/19/2015  . History of chicken pox   . On prednisone therapy 09/25/2013  . Intercostal muscle pain 09/20/2013  . Preventative health care 08/06/2013  . GERD (gastroesophageal reflux disease) 08/01/2012  . Tachycardia 07/28/2012  . Health care maintenance 01/31/2012  . Chronic sinusitis 01/31/2012  . High risk medication use 08/17/2011  . 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 Chronic cough 09/06/2010  . Anemia 06/23/2006  . Osteoporosis 06/23/2006    Past Surgical History:  Procedure Laterality Date  . NASAL SINUS SURGERY  06/2012  . NASAL SINUS SURGERY     2010    OB History   No obstetric history on file.      Home Medications    Prior to Admission medications   Medication Sig Start Date End Date Taking? Authorizing Provider  albuterol (VENTOLIN HFA) 108 (90 Base) MCG/ACT inhaler INHALE ONE TO TWO PUFFS INTO THE LUNGS EVERY 6 HOURS AS NEEDED FOR WHEEZING OR SHORTNESS OF BREATH 01/17/22   Mosie Lukes, MD  atorvastatin (LIPITOR) 10 MG tablet Take 1 tablet (10 mg total) by mouth daily. 02/14/22   Mosie Lukes, MD  benzonatate (TESSALON) 100 MG capsule Take 1 capsule (100 mg total) by mouth 3 (three) times daily as needed. 04/12/22   Debbrah Alar, NP  COVID-19 mRNA bivalent vaccine, [place], (PFIZER COVID-19 [facility]  BIVALENT) injection Inject into the muscle. 06/11/21   Judyann Munson, MD  COVID-19 mRNA Vac-TriS, Pfizer, (PFIZER-BIONT COVID-19 VAC-TRIS) SUSP injection Inject into the muscle. 10/27/20   Judyann Munson, MD  famotidine (PEPCID) 40 MG tablet TAKE 1 TABLET BY MOUTH EVERY DAY 04/30/22   [name], MD  fexofenadine (ALLEGRA) 180 MG tablet Take 180 mg by mouth daily.    [provider]  fluticasone (FLONASE) 50 MCG/ACT nasal spray Place 2 sprays into both nostrils daily. 06/21/22   [name], MD   gabapentin (NEURONTIN) 300 MG capsule Take 1 capsule (300 mg total) by mouth 3 (three) times daily. 02/16/22   [name], MD  HEMATINIC/FOLIC ACID 324-1 MG TABS TAKE 1 TABLET BY MOUTH EVERY DAY 06/30/22   [name], MD  HYDROcodone-acetaminophen (NORCO/VICODIN) 5-325 MG tablet Take 1 tablet by mouth every 6 (six) hours as needed for moderate pain or severe pain. 05/03/22   [name], MD  levocetirizine (XYZAL) 5 MG tablet Take 1 tablet (5 mg total) by mouth every evening. 10/25/19   Saguier, Ramon Dredge, PA-C  meloxicam (MOBIC) 7.5 MG tablet TAKE 1 TO 2 TABLETS BY MOUTH ONCE DAILY AS NEEDED FOR PAIN 06/27/22   [name], MD  metoprolol succinate (TOPROL-XL) 25 MG 24 hr tablet Take 1 tablet by mouth once daily 07/04/22   [name], MD  montelukast (SINGULAIR) 10 MG tablet TAKE 1 TABLET BY MOUTH AT BEDTIME 12/22/21   [name], MD  pantoprazole (PROTONIX) 40 MG tablet Take 1 tablet by mouth once daily Patient not taking: Reported on 06/27/2022 04/07/20   [name], MD  predniSONE (DELTASONE) 2.5 MG tablet Take 1 tablet by mouth once daily with breakfast 05/03/22   [name], MD  promethazine (PHENERGAN) 6.25 MG/5ML syrup Take 5 mLs (6.25 mg total) by mouth at bedtime as needed (Cough). 01/26/22   [name], MD  sodium chloride 0.9 % nebulizer solution Take 3 mLs by nebulization in the morning and at bedtime. 03/11/22   Charlott Holler, MD  tiZANidine (ZANAFLEX) 4 MG tablet Take 1 tablet (4 mg total) by mouth every 6 (six) hours as needed for muscle spasms. Patient not taking: Reported on 06/27/2022 07/02/21   Sharlene Dory, DO    Family History Family History  Problem Relation Age of Onset  . Liver cancer Maternal Grandmother   . Hypertension Maternal Grandmother   . Heart attack Maternal Grandmother   . Scleroderma Mother   . Heart disease Mother   . Kidney disease Mother   . Other Mother   . Prostate cancer Father   . Diabetes Father   .  Heart disease Father        CHF  . Arthritis Father        s/p hip replacement  . Cancer Father        lung cancer with mets, smoker  . Lung cancer Paternal Uncle   . Coronary artery disease Paternal Grandmother   . Hypertension Paternal Grandmother   . Liver cancer Paternal Grandmother   . Hyperlipidemia Son   . Alzheimer's disease Paternal Grandfather   . Other Sister        Cardiomegaly  . Arthritis Sister   . Heart disease Sister        rare  . Sleep apnea Brother   . Heart disease Brother   . Arthritis Daughter   . Lupus Cousin   . Arthritis Cousin   .  Colon cancer Neg Hx   . Asthma Neg Hx   . Allergic rhinitis Neg Hx   . Eczema Neg Hx   . Immunodeficiency Neg Hx   . Angioedema Neg Hx     Social History Social History   Tobacco Use  . Smoking status: Never  . Smokeless tobacco: Never  Vaping Use  . Vaping Use: Never used  Substance Use Topics  . Alcohol use: No    Alcohol/week: 0.0 standard drinks of alcohol  . Drug use: No     Allergies   Avelox [moxifloxacin hcl in nacl], Bactrim [sulfamethoxazole-trimethoprim], Doxycycline, Sulfamethoxazole-trimethoprim, and Augmentin [amoxicillin-pot clavulanate]   Review of Systems Review of Systems  Constitutional:  Negative for chills and fever.  HENT:  Negative for ear pain and sore throat.   Eyes:  Negative for pain and visual disturbance.  Respiratory:  Negative for cough and shortness of breath.   Cardiovascular:  Negative for chest pain and palpitations.  Gastrointestinal:  Negative for abdominal pain and vomiting.  Genitourinary:  Negative for dysuria and hematuria.  Musculoskeletal:  Positive for arthralgias. Negative for back pain.  Skin:  Positive for wound. Negative for color change and rash.  Neurological:  Negative for seizures and syncope.  All other systems reviewed and are negative.    Physical Exam Triage Vital Signs ED Triage Vitals [07/02/22 1848]  Enc Vitals Group     BP (!) 133/52      Pulse Rate 81     Resp 16     Temp 98.1 F (36.7 C)     Temp Source Oral     SpO2 99 %     Weight      Height      Head Circumference      Peak Flow      Pain Score      Pain Loc      Pain Edu?      Excl. in GC?    No data found.  Updated Vital Signs BP (!) 133/52 (BP Location: Left Arm)   Pulse 81   Temp 98.1 F (36.7 C) (Oral)   Resp 16   SpO2 99%   Visual Acuity Right Eye Distance:   Left Eye Distance:   Bilateral Distance:    Right Eye Near:   Left Eye Near:    Bilateral Near:     Physical Exam Vitals and nursing note reviewed.  Constitutional:      General: She is not in acute distress.    Appearance: She is well-developed.  HENT:     Head: Normocephalic and atraumatic.  Eyes:     Conjunctiva/sclera: Conjunctivae normal.  Cardiovascular:     Rate and Rhythm: Normal rate and regular rhythm.     Heart sounds: No murmur heard. Pulmonary:     Effort: Pulmonary effort is normal. No respiratory distress.     Breath sounds: Normal breath sounds.  Abdominal:     Palpations: Abdomen is soft.     Tenderness: There is no abdominal tenderness.  Musculoskeletal:        General: No swelling.     Cervical back: Neck supple.     Comments: Minimal range of motion of right pinky finger due to previous fracture.  Dorsal aspect of finger with small laceration, 0.5 cm linear.   Skin:    General: Skin is warm and dry.     Capillary Refill: Capillary refill takes less than 2 seconds.  Neurological:     Mental  Status: She is alert.  Psychiatric:        Mood and Affect: Mood normal.     UC Treatments / Results  Labs (all labs ordered are listed, but only abnormal results are displayed) Labs Reviewed - No data to display  EKG   Radiology DG Finger Little Right  Result Date: 07/02/2022 CLINICAL DATA:  Right little finger pain after being hit with a basketball. Previous right little finger fracture. EXAM: RIGHT LITTLE FINGER 2+V COMPARISON:  09/06/2005  FINDINGS: Old, healed fracture of the distal aspect of the 5th proximal phalanx. No acute fracture or dislocation seen. Mild degenerative spur formation at the 5th PIP joint. Progressive diffuse significantly progressive diffuse right wrist degenerative changes with progressive carpal collapse. IMPRESSION: 1. No acute fracture. 2. Progressive diffuse right wrist degenerative changes with progressive carpal collapse. Electronically Signed   By: Claudie Revering M.D.   On: 07/02/2022 18:25    Procedures Laceration Repair  Date/Time: 07/04/2022 6:13 PM  Performed by: Ward, Lenise Arena, PA-C Authorized by: Chase Picket, MD   Consent:    Consent obtained:  Verbal   Consent given by:  Patient   Risks, benefits, and alternatives were discussed: yes     Risks discussed:  Infection and poor wound healing   Alternatives discussed:  No treatment Universal protocol:    Procedure explained and questions answered to patient or proxy's satisfaction: yes     Patient identity confirmed:  Verbally with patient Anesthesia:    Anesthesia method:  None Laceration details:    Location:  Finger   Finger location:  R small finger   Length (cm):  0.5 Exploration:    Contaminated: no   Treatment:    Area cleansed with:  Saline   Amount of cleaning:  Standard   Irrigation method:  Pressure wash   Visualized foreign bodies/material removed: no   Skin repair:    Repair method:  Tissue adhesive Approximation:    Approximation:  Close Repair type:    Repair type:  Simple  (including critical care time)  Medications Ordered in UC Medications - No data to display  Initial Impression / Assessment and Plan / UC Course  I have reviewed the triage vital signs and the nursing notes.  Pertinent labs & imaging results that were available during my care of the patient were reviewed by me and considered in my medical decision making (see chart for details).     Right finger laceration, wound is very small.   Skin glue applied.  Patient with minimal range of motion due to previous fracture and no new fracture noted on imaging.  Supportive care discussed.  Return precautions discussed. Final Clinical Impressions(s) / UC Diagnoses   Final diagnoses:  Laceration of right little finger without foreign body without damage to nail, initial encounter     Discharge Instructions      Keep area clean and dry.  Recommend ibuprofen as needed for discomfort.    ED Prescriptions   None    PDMP not reviewed this encounter.   Ward, Lenise Arena, PA-C 07/04/22 1815

## 2022-07-05 ENCOUNTER — Telehealth: Payer: Self-pay | Admitting: Family Medicine

## 2022-07-05 ENCOUNTER — Other Ambulatory Visit: Payer: Self-pay | Admitting: Family Medicine

## 2022-07-05 DIAGNOSIS — M069 Rheumatoid arthritis, unspecified: Secondary | ICD-10-CM

## 2022-07-05 MED ORDER — HYDROCODONE-ACETAMINOPHEN 5-325 MG PO TABS: 1.0000 | ORAL_TABLET | Freq: Four times a day (QID) | ORAL | 0 refills | Status: DC | PRN

## 2022-07-05 NOTE — Telephone Encounter (Signed)
Prescription Request  07/05/2022  Is this a "Controlled Substance" medicine? Yes  LOV: Visit date not found  What is the name of the medication or equipment?  HYDROcodone-acetaminophen (NORCO/VICODIN) 5-325 MG tablet   Have you contacted your pharmacy to request a refill? No   Which pharmacy would you like this sent to?  [place] ([place]), [place] - [address] [place] ([place]) [place] [address] Phone: [phone] Fax: [phone]   Patient notified that their request is being sent to the clinical staff for review and that they should receive a response within 2 business days.   Please advise at Mobile [phone] (mobile)

## 2022-07-07 DIAGNOSIS — S62629A Displaced fracture of medial phalanx of unspecified finger, initial encounter for closed fracture: Secondary | ICD-10-CM | POA: Insufficient documentation

## 2022-07-08 ENCOUNTER — Other Ambulatory Visit: Payer: Self-pay | Admitting: Family Medicine

## 2022-07-13 ENCOUNTER — Ambulatory Visit (HOSPITAL_COMMUNITY)
Admission: AD | Admit: 2022-07-13 | Discharge: 2022-07-13 | Disposition: A | Discharge status: Home / Self Care | Payer: BC Managed Care – PPO | Source: Ambulatory Visit | Attending: Orthopedic Surgery | Admitting: Orthopedic Surgery

## 2022-07-13 ENCOUNTER — Ambulatory Visit (HOSPITAL_COMMUNITY): Payer: BC Managed Care – PPO

## 2022-07-13 ENCOUNTER — Other Ambulatory Visit: Payer: Self-pay

## 2022-07-13 ENCOUNTER — Encounter (HOSPITAL_COMMUNITY): Payer: Self-pay | Admitting: Orthopedic Surgery

## 2022-07-13 ENCOUNTER — Ambulatory Visit (HOSPITAL_COMMUNITY): Payer: BC Managed Care – PPO | Admitting: Anesthesiology

## 2022-07-13 ENCOUNTER — Encounter (HOSPITAL_COMMUNITY)
Admission: AD | Disposition: A | Discharge status: Home / Self Care | Payer: Self-pay | Source: Ambulatory Visit | Attending: Orthopedic Surgery

## 2022-07-13 DIAGNOSIS — K219 Gastro-esophageal reflux disease without esophagitis: Secondary | ICD-10-CM | POA: Insufficient documentation

## 2022-07-13 DIAGNOSIS — I081 Rheumatic disorders of both mitral and tricuspid valves: Secondary | ICD-10-CM | POA: Diagnosis not present

## 2022-07-13 DIAGNOSIS — E119 Type 2 diabetes mellitus without complications: Secondary | ICD-10-CM | POA: Insufficient documentation

## 2022-07-13 DIAGNOSIS — I10 Essential (primary) hypertension: Secondary | ICD-10-CM | POA: Diagnosis not present

## 2022-07-13 DIAGNOSIS — S62626B Displaced fracture of medial phalanx of right little finger, initial encounter for open fracture: Secondary | ICD-10-CM | POA: Insufficient documentation

## 2022-07-13 DIAGNOSIS — D649 Anemia, unspecified: Secondary | ICD-10-CM | POA: Diagnosis not present

## 2022-07-13 DIAGNOSIS — J45909 Unspecified asthma, uncomplicated: Secondary | ICD-10-CM | POA: Insufficient documentation

## 2022-07-13 DIAGNOSIS — R059 Cough, unspecified: Secondary | ICD-10-CM | POA: Diagnosis not present

## 2022-07-13 DIAGNOSIS — W230XXA Caught, crushed, jammed, or pinched between moving objects, initial encounter: Secondary | ICD-10-CM | POA: Insufficient documentation

## 2022-07-13 DIAGNOSIS — M069 Rheumatoid arthritis, unspecified: Secondary | ICD-10-CM | POA: Diagnosis not present

## 2022-07-13 DIAGNOSIS — T7840XA Allergy, unspecified, initial encounter: Secondary | ICD-10-CM

## 2022-07-13 HISTORY — DX: Essential (primary) hypertension: I10

## 2022-07-13 HISTORY — PX: OPEN REDUCTION INTERNAL FIXATION (ORIF) METACARPAL: SHX6234

## 2022-07-13 LAB — BASIC METABOLIC PANEL
Anion gap: 17 — ABNORMAL HIGH (ref 5–15)
BUN: 5 mg/dL — ABNORMAL LOW (ref 8–23)
CO2: 12 mmol/L — ABNORMAL LOW (ref 22–32)
Calcium: 7.5 mg/dL — ABNORMAL LOW (ref 8.9–10.3)
Chloride: 105 mmol/L (ref 98–111)
Creatinine, Ser: 0.36 mg/dL — ABNORMAL LOW (ref 0.44–1.00)
GFR, Estimated: >60 mL/min (ref >60)
Glucose, Bld: 42 mg/dL — CL (ref 70–99)
Potassium: 3.9 mmol/L (ref 3.5–5.1)
Sodium: 134 mmol/L — ABNORMAL LOW (ref 135–145)

## 2022-07-13 LAB — CBC
HCT: 20.9 % — ABNORMAL LOW (ref 36.0–46.0)
Hemoglobin: 6.5 g/dL — CL (ref 12.0–15.0)
MCH: 23.3 pg — ABNORMAL LOW (ref 26.0–34.0)
MCHC: 31.1 g/dL (ref 30.0–36.0)
MCV: 74.9 fL — ABNORMAL LOW (ref 80.0–100.0)
Platelets: 110 10*3/uL — ABNORMAL LOW (ref 150–400)
RBC: 2.79 MIL/uL — ABNORMAL LOW (ref 3.87–5.11)
RDW: 15 % (ref 11.5–15.5)
WBC: 3.7 10*3/uL — ABNORMAL LOW (ref 4.0–10.5)
nRBC: 0 % (ref 0.0–0.2)

## 2022-07-13 LAB — GLUCOSE, CAPILLARY: Glucose-Capillary: 155 mg/dL — ABNORMAL HIGH (ref 70–99)

## 2022-07-13 SURGERY — OPEN REDUCTION INTERNAL FIXATION (ORIF) METACARPAL
Anesthesia: Monitor Anesthesia Care | Site: Finger | Laterality: Right

## 2022-07-13 MED ORDER — HYDROMORPHONE HCL 1 MG/ML IJ SOLN
INTRAMUSCULAR | Status: DC | PRN
  Administered 2022-07-13 (×2): .25 mg via INTRAVENOUS

## 2022-07-13 MED ORDER — PROMETHAZINE HCL 25 MG/ML IJ SOLN: 6.2500 mg | INTRAMUSCULAR | Status: DC | PRN

## 2022-07-13 MED ORDER — MIDAZOLAM HCL 2 MG/2ML IJ SOLN
INTRAMUSCULAR | Status: DC | PRN
  Administered 2022-07-13 (×2): .5 mg via INTRAVENOUS
  Administered 2022-07-13: 1 mg via INTRAVENOUS

## 2022-07-13 MED ORDER — BUPIVACAINE HCL (PF) 0.25 % IJ SOLN
INTRAMUSCULAR | Status: DC | PRN
  Administered 2022-07-13: .01 mL

## 2022-07-13 MED ORDER — CHLORHEXIDINE GLUCONATE 0.12 % MT SOLN
15.0000 mL | Freq: Once | OROMUCOSAL | Status: AC
  Administered 2022-07-13: 15 mL via OROMUCOSAL
  Filled 2022-07-13: qty 15

## 2022-07-13 MED ORDER — FENTANYL CITRATE (PF) 100 MCG/2ML IJ SOLN
INTRAMUSCULAR | Status: DC | PRN
  Administered 2022-07-13 (×2): 25 ug via INTRAVENOUS
  Administered 2022-07-13: 50 ug via INTRAVENOUS

## 2022-07-13 MED ORDER — LIDOCAINE HCL (PF) 1 % IJ SOLN
INTRAMUSCULAR | Status: DC | PRN
  Administered 2022-07-13: 10 mL

## 2022-07-13 MED ORDER — DEXTROSE 50 % IV SOLN
INTRAVENOUS | Status: DC | PRN
  Administered 2022-07-13: 25 mL via INTRAVENOUS

## 2022-07-13 MED ORDER — BUPIVACAINE HCL (PF) 0.25 % IJ SOLN
INTRAMUSCULAR | Status: AC
  Filled 2022-07-13: qty 30

## 2022-07-13 MED ORDER — 0.9 % SODIUM CHLORIDE (POUR BTL) OPTIME
TOPICAL | Status: DC | PRN
  Administered 2022-07-13: 1000 mL

## 2022-07-13 MED ORDER — FENTANYL CITRATE (PF) 100 MCG/2ML IJ SOLN: 25.0000 ug | INTRAMUSCULAR | Status: DC | PRN

## 2022-07-13 MED ORDER — LACTATED RINGERS IV SOLN: INTRAVENOUS | Status: DC

## 2022-07-13 MED ORDER — LIDOCAINE 2% (20 MG/ML) 5 ML SYRINGE
INTRAMUSCULAR | Status: DC | PRN
  Administered 2022-07-13: 50 mg via INTRAVENOUS

## 2022-07-13 MED ORDER — ORAL CARE MOUTH RINSE: 15.0000 mL | Freq: Once | OROMUCOSAL | Status: AC

## 2022-07-13 MED ORDER — HYDROCODONE-ACETAMINOPHEN 5-325 MG PO TABS: 1.0000 | ORAL_TABLET | ORAL | 0 refills | Status: AC | PRN

## 2022-07-13 MED ORDER — ACETAMINOPHEN 325 MG PO TABS: 325.0000 mg | ORAL_TABLET | ORAL | Status: DC | PRN

## 2022-07-13 MED ORDER — MIDAZOLAM HCL 2 MG/2ML IJ SOLN
INTRAMUSCULAR | Status: AC
  Filled 2022-07-13: qty 2

## 2022-07-13 MED ORDER — LIDOCAINE HCL (PF) 1 % IJ SOLN
INTRAMUSCULAR | Status: AC
  Filled 2022-07-13: qty 30

## 2022-07-13 MED ORDER — FENTANYL CITRATE (PF) 100 MCG/2ML IJ SOLN
INTRAMUSCULAR | Status: AC
  Filled 2022-07-13: qty 2

## 2022-07-13 MED ORDER — ACETAMINOPHEN 160 MG/5ML PO SOLN: 325.0000 mg | ORAL | Status: DC | PRN

## 2022-07-13 MED ORDER — HYDROMORPHONE HCL 1 MG/ML IJ SOLN
INTRAMUSCULAR | Status: AC
  Filled 2022-07-13: qty 0.5

## 2022-07-13 MED ORDER — OXYCODONE HCL 5 MG/5ML PO SOLN: 5.0000 mg | Freq: Once | ORAL | Status: DC | PRN

## 2022-07-13 MED ORDER — LIDOCAINE 2% (20 MG/ML) 5 ML SYRINGE
INTRAMUSCULAR | Status: AC
  Filled 2022-07-13: qty 5

## 2022-07-13 MED ORDER — PROPOFOL 500 MG/50ML IV EMUL
INTRAVENOUS | Status: DC | PRN
  Administered 2022-07-13: 50 ug/kg/min via INTRAVENOUS

## 2022-07-13 MED ORDER — OXYCODONE HCL 5 MG PO TABS: 5.0000 mg | ORAL_TABLET | Freq: Once | ORAL | Status: DC | PRN

## 2022-07-13 MED ORDER — CEFAZOLIN SODIUM-DEXTROSE 2-4 GM/100ML-% IV SOLN
2.0000 g | INTRAVENOUS | Status: AC
  Administered 2022-07-13: 2 g via INTRAVENOUS
  Filled 2022-07-13: qty 100

## 2022-07-13 MED ORDER — AMISULPRIDE (ANTIEMETIC) 5 MG/2ML IV SOLN: 10.0000 mg | Freq: Once | INTRAVENOUS | Status: DC | PRN

## 2022-07-13 MED ORDER — DEXAMETHASONE SODIUM PHOSPHATE 10 MG/ML IJ SOLN
INTRAMUSCULAR | Status: DC | PRN
  Administered 2022-07-13: 5 mg via INTRAVENOUS

## 2022-07-13 MED ORDER — ONDANSETRON HCL 4 MG/2ML IJ SOLN
INTRAMUSCULAR | Status: DC | PRN
  Administered 2022-07-13: 4 mg via INTRAVENOUS

## 2022-07-13 SURGICAL SUPPLY — 58 items
BAG COUNTER SPONGE SURGICOUNT (BAG) ×2 IMPLANT
BAG SPNG CNTER NS LX DISP (BAG) ×1
BLADE CLIPPER SURG (BLADE) IMPLANT
BNDG CMPR 9X4 STRL LF SNTH (GAUZE/BANDAGES/DRESSINGS) ×1
BNDG ELASTIC 3X5.8 VLCR STR LF (GAUZE/BANDAGES/DRESSINGS) ×2 IMPLANT
BNDG ELASTIC 4X5.8 VLCR STR LF (GAUZE/BANDAGES/DRESSINGS) ×2 IMPLANT
BNDG ESMARK 4X9 LF (GAUZE/BANDAGES/DRESSINGS) ×2 IMPLANT
BNDG GAUZE DERMACEA FLUFF 4 (GAUZE/BANDAGES/DRESSINGS) ×2 IMPLANT
BNDG GZE DERMACEA 4 6PLY (GAUZE/BANDAGES/DRESSINGS) ×1
CORD BIPOLAR FORCEPS 12FT (ELECTRODE) ×2 IMPLANT
COVER SURGICAL LIGHT HANDLE (MISCELLANEOUS) ×2 IMPLANT
CUFF TOURN SGL QUICK 18X4 (TOURNIQUET CUFF) ×2 IMPLANT
CUFF TOURN SGL QUICK 24 (TOURNIQUET CUFF)
CUFF TRNQT CYL 24X4X16.5-23 (TOURNIQUET CUFF) IMPLANT
DRAIN TLS ROUND 10FR (DRAIN) IMPLANT
DRAPE OEC MINIVIEW 54X84 (DRAPES) ×2 IMPLANT
DRAPE SURG 17X11 SM STRL (DRAPES) ×2 IMPLANT
DRSG ADAPTIC 3X8 NADH LF (GAUZE/BANDAGES/DRESSINGS) ×2 IMPLANT
DRSG XEROFORM 1X8 (GAUZE/BANDAGES/DRESSINGS) IMPLANT
GAUZE 4X4 16PLY ~~LOC~~+RFID DBL (SPONGE) IMPLANT
GAUZE SPONGE 2X2 8PLY STRL LF (GAUZE/BANDAGES/DRESSINGS) IMPLANT
GAUZE SPONGE 4X4 12PLY STRL (GAUZE/BANDAGES/DRESSINGS) ×2 IMPLANT
GLOVE BIOGEL PI IND STRL 8.5 (GLOVE) ×2 IMPLANT
GLOVE SURG ORTHO 8.0 STRL STRW (GLOVE) ×2 IMPLANT
GOWN STRL REUS W/ TWL LRG LVL3 (GOWN DISPOSABLE) ×6 IMPLANT
GOWN STRL REUS W/ TWL XL LVL3 (GOWN DISPOSABLE) ×2 IMPLANT
GOWN STRL REUS W/TWL LRG LVL3 (GOWN DISPOSABLE) ×3
GOWN STRL REUS W/TWL XL LVL3 (GOWN DISPOSABLE) ×1
K-WIRE 0.9X102 (Wire) ×2 IMPLANT
KIT BASIN OR (CUSTOM PROCEDURE TRAY) ×2 IMPLANT
KIT TURNOVER KIT B (KITS) ×2 IMPLANT
KWIRE 0.9X102 (Wire) IMPLANT
MANIFOLD NEPTUNE II (INSTRUMENTS) ×2 IMPLANT
NDL 25GX 5/8IN NON SAFETY (NEEDLE) IMPLANT
NDL HYPO 25X1 1.5 SAFETY (NEEDLE) ×2 IMPLANT
NEEDLE 25GX 5/8IN NON SAFETY (NEEDLE) ×1 IMPLANT
NEEDLE HYPO 25X1 1.5 SAFETY (NEEDLE) ×1 IMPLANT
NS IRRIG 1000ML POUR BTL (IV SOLUTION) ×2 IMPLANT
PACK ORTHO EXTREMITY (CUSTOM PROCEDURE TRAY) ×2 IMPLANT
PAD ARMBOARD 7.5X6 YLW CONV (MISCELLANEOUS) ×4 IMPLANT
PAD CAST 4YDX4 CTTN HI CHSV (CAST SUPPLIES) ×2 IMPLANT
PADDING CAST COTTON 4X4 STRL (CAST SUPPLIES) ×1
SOAP 2 % CHG 4 OZ (WOUND CARE) ×2 IMPLANT
STRIP CLOSURE SKIN 1/2X4 (GAUZE/BANDAGES/DRESSINGS) IMPLANT
SUT ETHIBOND 4 0 TF (SUTURE) IMPLANT
SUT ETHILON 4 0 PS 2 18 (SUTURE) IMPLANT
SUT MNCRL AB 4-0 PS2 18 (SUTURE) IMPLANT
SUT PROLENE 4 0 PS 2 18 (SUTURE) IMPLANT
SUT SILK 4 0 PS 2 (SUTURE) IMPLANT
SUT VIC AB 2-0 FS1 27 (SUTURE) IMPLANT
SUT VICRYL 4-0 PS2 18IN ABS (SUTURE) IMPLANT
SYR CONTROL 10ML LL (SYRINGE) IMPLANT
SYSTEM CHEST DRAIN TLS 7FR (DRAIN) IMPLANT
TOWEL GREEN STERILE (TOWEL DISPOSABLE) ×2 IMPLANT
TOWEL GREEN STERILE FF (TOWEL DISPOSABLE) ×2 IMPLANT
TUBE CONNECTING 12X1/4 (SUCTIONS) ×2 IMPLANT
WATER STERILE IRR 1000ML POUR (IV SOLUTION) ×2 IMPLANT
YANKAUER SUCT BULB TIP NO VENT (SUCTIONS) IMPLANT

## 2022-07-13 NOTE — H&P (Signed)
Jamie Cox is an 62 y.o. female.   Chief Complaint: Right small finger middle phalanx fracture HPI: Patient is a right-hand-dominant female sustained a crushing injury to the right small finger middle phalanx.  Patient was seen and evaluated the office recommended undergo the above procedure.  Patient is here for surgery today.  No prior surgery to the right small finger. Past Medical History:  Diagnosis Date   Allergy    Asthma    Atypical chest pain    Chronic sinusitis    Followed by Dr. Redmond Baseman   Environmental allergies    GERD (gastroesophageal reflux disease)    High serum parathyroid hormone (PTH) 06/09/2016   History of chicken pox    Hypercalcemia 07/19/2015   Osteoporosis, unspecified    steroid induced   Personal history of other diseases of circulatory system    Rheumatoid arthritis(714.0)    Unspecified deficiency anemia    microcytic   Urticaria     Past Surgical History:  Procedure Laterality Date   NASAL SINUS SURGERY  06/2012   NASAL SINUS SURGERY     2010    Family History  Problem Relation Age of Onset   Liver cancer Maternal Grandmother    Hypertension Maternal Grandmother    Heart attack Maternal Grandmother    Scleroderma Mother    Heart disease Mother    Kidney disease Mother    Other Mother    Prostate cancer Father    Diabetes Father    Heart disease Father        CHF   Arthritis Father        s/p hip replacement   Cancer Father        lung cancer with mets, smoker   Lung cancer Paternal Uncle    Coronary artery disease Paternal Grandmother    Hypertension Paternal Grandmother    Liver cancer Paternal Grandmother    Hyperlipidemia Son    Alzheimer's disease Paternal Grandfather    Other Sister        Cardiomegaly   Arthritis Sister    Heart disease Sister        rare   Sleep apnea Brother    Heart disease Brother    Arthritis Daughter    Lupus Cousin    Arthritis Cousin    Colon cancer Neg Hx    Asthma Neg Hx    Allergic  rhinitis Neg Hx    Eczema Neg Hx    Immunodeficiency Neg Hx    Angioedema Neg Hx    Social History:  reports that she has never smoked. She has never used smokeless tobacco. She reports that she does not drink alcohol and does not use drugs.  Allergies:  Allergies  Allergen Reactions   Avelox [Moxifloxacin Hcl In Nacl] Diarrhea, Nausea And Vomiting and Other (See Comments)    Dizziness, Cold chills    Bactrim [Sulfamethoxazole-Trimethoprim] Hives   Doxycycline     Abdominal discomfort, anorexia   Sulfamethoxazole-Trimethoprim Hives   Augmentin [Amoxicillin-Pot Clavulanate] Hives, Itching and Swelling    Medications Prior to Admission  Medication Sig Dispense Refill   albuterol (VENTOLIN HFA) 108 (90 Base) MCG/ACT inhaler INHALE ONE TO TWO PUFFS INTO THE LUNGS EVERY 6 HOURS AS NEEDED FOR WHEEZING OR SHORTNESS OF BREATH 18 each 5   atorvastatin (LIPITOR) 10 MG tablet Take 1 tablet (10 mg total) by mouth daily. 90 tablet 1   benzonatate (TESSALON) 100 MG capsule Take 1 capsule (100 mg total) by mouth 3 (  three) times daily as needed. 20 capsule 0   COVID-19 mRNA bivalent vaccine, Pfizer, (PFIZER COVID-19 VAC BIVALENT) injection Inject into the muscle. 0.3 mL 0   COVID-19 mRNA Vac-TriS, Pfizer, (PFIZER-BIONT COVID-19 VAC-TRIS) SUSP injection Inject into the muscle. 0.3 mL 0   famotidine (PEPCID) 40 MG tablet TAKE 1 TABLET BY MOUTH EVERY DAY 90 tablet 1   fexofenadine (ALLEGRA) 180 MG tablet Take 180 mg by mouth daily.     fluticasone (FLONASE) 50 MCG/ACT nasal spray Place 2 sprays into both nostrils daily. 16 g 5   gabapentin (NEURONTIN) 300 MG capsule Take 1 capsule (300 mg total) by mouth 3 (three) times daily. 270 capsule 1   HEMATINIC/FOLIC ACID 578-4 MG TABS TAKE 1 TABLET BY MOUTH EVERY DAY 90 tablet 1   HYDROcodone-acetaminophen (NORCO/VICODIN) 5-325 MG tablet Take 1 tablet by mouth every 6 (six) hours as needed for moderate pain or severe pain. 40 tablet 0   levocetirizine (XYZAL) 5  MG tablet Take 1 tablet (5 mg total) by mouth every evening. 30 tablet 0   meloxicam (MOBIC) 7.5 MG tablet TAKE 1 TO 2 TABLETS BY MOUTH ONCE DAILY AS NEEDED FOR PAIN 30 tablet 2   metoprolol succinate (TOPROL-XL) 25 MG 24 hr tablet Take 1 tablet by mouth once daily 90 tablet 0   montelukast (SINGULAIR) 10 MG tablet TAKE 1 TABLET BY MOUTH AT BEDTIME 90 tablet 0   pantoprazole (PROTONIX) 40 MG tablet Take 1 tablet by mouth once daily (Patient not taking: Reported on 06/27/2022) 90 tablet 0   predniSONE (DELTASONE) 2.5 MG tablet Take 1 tablet by mouth once daily with breakfast 30 tablet 5   promethazine (PHENERGAN) 6.25 MG/5ML syrup Take 5 mLs (6.25 mg total) by mouth at bedtime as needed (Cough). 120 mL 0   sodium chloride 0.9 % nebulizer solution Take 3 mLs by nebulization in the morning and at bedtime. 90 mL 12   tiZANidine (ZANAFLEX) 4 MG tablet Take 1 tablet (4 mg total) by mouth every 6 (six) hours as needed for muscle spasms. (Patient not taking: Reported on 06/27/2022) 30 tablet 0    No results found for this or any previous visit (from the past 48 hour(s)). No results found.  ROS: No recent illnesses or hospitalizations.  Blood pressure (!) 149/88, pulse 99, temperature 98.2 F (36.8 C), resp. rate 20, height 5\' 1"  (1.549 m), weight 49 kg, SpO2 100 %. Physical Exam General Appearance:  Alert, cooperative, no distress, appears stated age  Head:  Normocephalic, without obvious abnormality, atraumatic  Eyes:  Pupils equal, conjunctiva/corneas clear,         Throat: Lips, mucosa, and tongue normal; teeth and gums normal  Neck: No visible masses     Lungs:   respirations unlabored  Chest Wall:  No tenderness or deformity  Heart:  Regular rate and rhythm,  Abdomen:   Soft, non-tender,         Extremities: The patient does have a laceration directly over the small finger middle phalanx patient is able to gently wiggle her fingers her fingers are warm well-perfused good capillary refill.   Pulses: 2+ and symmetric  Skin: Skin color, texture, turgor normal, no rashes or lesions     Neurologic: Normal     Assessment/Plan Right small finger open middle phalanx fracture  Right small finger open reduction internal fixation and repair as indicated.  R/B/A DISCUSSED WITH PT IN OFFICE.  PT VOICED UNDERSTANDING OF PLAN CONSENT SIGNED DAY OF SURGERY PT SEEN  AND EXAMINED PRIOR TO OPERATIVE PROCEDURE/DAY OF SURGERY SITE MARKED. QUESTIONS ANSWERED WILL GO HOME FOLLOWING SURGERY   WE ARE PLANNING SURGERY FOR YOUR UPPER EXTREMITY. THE RISKS AND BENEFITS OF SURGERY INCLUDE BUT NOT LIMITED TO BLEEDING INFECTION, DAMAGE TO NEARBY NERVES ARTERIES TENDONS, FAILURE OF SURGERY TO ACCOMPLISH ITS INTENDED GOALS, PERSISTENT SYMPTOMS AND NEED FOR FURTHER SURGICAL INTERVENTION. WITH THIS IN MIND WE WILL PROCEED. I HAVE DISCUSSED WITH THE PATIENT THE PRE AND POSTOPERATIVE REGIMEN AND THE DOS AND DON'TS. PT VOICED UNDERSTANDING AND INFORMED CONSENT SIGNED.     Janelle Floor [facility] 07/13/2022, 11:55 AM

## 2022-07-13 NOTE — Transfer of Care (Signed)
Immediate Anesthesia Transfer of Care Note  Patient: Jamie Cox  Procedure(s) Performed: OPEN REDUCTION INTERNAL FIXATION METACARPAL RIGHT SMALL FINGER (Right: Finger)  Patient Location: PACU  Anesthesia Type:General  Level of Consciousness: oriented and patient cooperative  Airway & Oxygen Therapy: Patient connected to face mask oxygen  Post-op Assessment: Report given to RN and Post -op Vital signs reviewed and stable  Post vital signs: Reviewed and stable  Last Vitals:  Vitals Value Taken Time  BP 136/81 07/13/22 1424  Temp    Pulse 99 07/13/22 1427  Resp 21 07/13/22 1427  SpO2 100 % 07/13/22 1427  Vitals shown include unvalidated device data.  Last Pain:  Vitals:   07/13/22 1159  PainSc: 0-No pain         Complications: No notable events documented.

## 2022-07-13 NOTE — Op Note (Signed)
PREOPERATIVE DIAGNOSIS: Right small finger open displaced middle phalanx  POSTOPERATIVE DIAGNOSIS: Same  ATTENDING SURGEON: Dr. Iran Planas is currently present for the entire procedure  ASSISTANT SURGEON:  ANESTHESIA: 1% Xylocaine, with IV sedation  OPERATIVE PROCEDURE: Open treatment of right small finger middle phalanx fracture requiring internal fixation Open debridement of skin subcutaneous tissue and bone open right small finger middle phalanx Radiographs 3 views right small finger  IMPLANTS: Two 0.035 K wires  EBL: Minimal  RADIOGRAPHIC INTERPRETATION: AP lateral and oblique views of the finger do show the cross K wire fixation in place in good position  SURGICAL INDICATIONS: Patient is a right-hand-dominant female sustained a crushing injury to her right small finger.  Patient was seen evaluate the office and recommend undergo the above procedure.  The risks of surgery include but not limited to bleeding infection damage nearby nerves arteries or tendons loss of motion of the wrist and digits nonunion malunion hardware failure need for further surgical invention.  Signed informed consent was obtained the day of surgery.  SURGICAL TECHNIQUE: Patient was prepped identified.  The area marked improvement of the right small finger indicate correct operative site.  Patient brought back to operating placed supine on the anesthesia table where the IV sedation was administered.  Patient tolerates well.  Well-padded tourniquet placed on the right brachium and sealed with the appropriate drape.  The right upper extremities then prepped and draped normal sterile fashion.  Timeout was called the correct site was identified procedure then began.  Local anesthetic administered.  Able to close the incision made directly over the center of the middle phalanx.  The open fracture site was then debrided the skin and subcutaneous tissue with sharp scissors and knife.  Dissection carried down through the  extensor mechanism and the fracture site was then identified and debridement of the open fracture was then carried out with small curettes and rondure.  After open debridement the wound was then thoroughly irrigated.  Copious wound irrigation done.  Following this attention was then turned to stabilization of the fracture.  A 0.035 K wire was then placed longitudinally about the distal phalanx and additional oblique K wire was then placed at the site of the finger along the dorsal column.  Close K wires were then carefully advanced secured across the fracture site.  They were cut and bent left out of the skin.  Wound was then irrigated.  Final radiographs were then obtained.  The extensor mechanism was then repaired with 4-0 Ethibond suture.  Skin was then closed with simple Prolene suture.  Xeroform bolster dressing was then applied.  Patient tolerated procedure well.  Patient was placed in a small finger splint taken recovery in good condition.    POSTOPERATIVE PLAN: Patient discharged to home.  See him back in the office in 8 days for wound check pin check the downstairs therapist for small finger gutter splint.  X-rays at each visit.  Proximal K wires out likely at the 4-week mark

## 2022-07-13 NOTE — Anesthesia Postprocedure Evaluation (Signed)
Anesthesia Post Note  Patient: Jamie Cox  Procedure(s) Performed: OPEN REDUCTION INTERNAL FIXATION METACARPAL RIGHT SMALL FINGER (Right: Finger)     Patient location during evaluation: PACU Anesthesia Type: MAC Level of consciousness: awake and alert Pain management: pain level controlled Vital Signs Assessment: post-procedure vital signs reviewed and stable Respiratory status: spontaneous breathing, nonlabored ventilation, respiratory function stable and patient connected to nasal cannula oxygen Cardiovascular status: stable and blood pressure returned to baseline Postop Assessment: no apparent nausea or vomiting Anesthetic complications: no Comments: Reviewed labs (hgb) with patient. She denies symptoms associated with anemia, dark stools or bouts of emesis. She states she has an appointment with her PCP in one week. I instructed her to get repeat labs at her next PCP appointment as a workup for her anemia may be necessary.   No notable events documented.  Last Vitals:  Vitals:   07/13/22 1425 07/13/22 1440  BP: 136/81 127/77  Pulse: 97 95  Resp: 20 19  Temp: 36.7 C 36.9 C  SpO2: 100% 99%    Last Pain:  Vitals:   07/13/22 1440  PainSc: 0-No pain                 Effie Berkshire

## 2022-07-13 NOTE — Telephone Encounter (Signed)
Prolia VOB initiated via [email or web address]  Last Prolia inj 02/03/22 Next Prolia inj due 08/08/22

## 2022-07-13 NOTE — Progress Notes (Signed)
Was told during hand off that patient's hemoglobin was low. Checked with Smith Robert, MD about patient being discharged. Says as long as patient's BP, HR were good and patient was not orthostatic, patient was clear for discharge. Patient ambulated to bathroom, gait steady and tolerated well

## 2022-07-13 NOTE — OR Nursing (Signed)
Received critical glucose lab from [place] in lab. Pt glucose is 42. CRNA Capelle Hannifin notified.

## 2022-07-13 NOTE — Progress Notes (Signed)
Lab called with critical HGB, Dr. Smith Robert made aware.

## 2022-07-13 NOTE — Discharge Instructions (Signed)
KEEP BANDAGE CLEAN AND DRY CALL OFFICE FOR F/U APPT [phone] in 08 days KEEP HAND ELEVATED ABOVE HEART OK TO APPLY ICE TO OPERATIVE AREA CONTACT OFFICE IF ANY WORSENING PAIN OR CONCERNS.

## 2022-07-13 NOTE — Progress Notes (Signed)
DONE AT 1441

## 2022-07-13 NOTE — Anesthesia Preprocedure Evaluation (Addendum)
Anesthesia Evaluation  Patient identified by MRN, date of birth, ID band Patient awake    Reviewed: Allergy & Precautions, NPO status , Patient's Chart, lab work & pertinent test results  History of Anesthesia Complications Negative for: history of anesthetic complications  Airway Mallampati: I  TM Distance: >3 FB Neck ROM: Full    Dental  (+) Poor Dentition, Missing, Chipped, Dental Advisory Given   Pulmonary asthma     + decreased breath sounds      Cardiovascular hypertension,  Rhythm:Regular Rate:Normal  08/2017  Blood pressure demonstrated a normal response to exercise.  Pt walked for 6:30 of a Bruce protocol GXT.  Peak HR of 160 which is 98% predicted maximal HR . NO ST or T wave changes to suggest ischemia  Hypertensive response to exercise  Negative for ischemia . Negative GXT.  08/2017 Study Conclusions   - Left ventricle: The cavity size was normal. Systolic function was    normal. The estimated ejection fraction was in the range of 55%    to 60%. Wall motion was normal; there were no regional wall    motion abnormalities. Left ventricular diastolic function    parameters were normal.  - Aortic valve: There was no regurgitation.  - Mitral valve: There was mild regurgitation.  - Left atrium: The atrium was normal in size.  - Right ventricle: The cavity size was normal. Wall thickness was    normal. Systolic function was normal.  - Right atrium: The atrium was normal in size.  - Tricuspid valve: There was mild regurgitation.  - Pulmonary arteries: Systolic pressure was within the normal    range.  - Inferior vena cava: The vessel was normal in size.  - Pericardium, extracardiac: There was no pericardial effusion.        Neuro/Psych negative neurological ROS     GI/Hepatic Neg liver ROS,GERD  ,,  Endo/Other  diabetes    Renal/GU negative Renal ROS     Musculoskeletal  (+) Arthritis , Rheumatoid  disorders,    Abdominal   Peds  Hematology  (+) Blood dyscrasia, anemia   Anesthesia Other Findings - Nonproductive cough  Reproductive/Obstetrics                             Anesthesia Physical Anesthesia Plan  ASA: 3  Anesthesia Plan: MAC   Post-op Pain Management: Tylenol PO (pre-op)*   Induction: Intravenous  PONV Risk Score and Plan: 2 and Ondansetron, Propofol infusion and Midazolam  Airway Management Planned: Natural Airway and Simple Face Mask  Additional Equipment: None  Intra-op Plan:   Post-operative Plan:   Informed Consent: I have reviewed the patients History and Physical, chart, labs and discussed the procedure including the risks, benefits and alternatives for the proposed anesthesia with the patient or authorized representative who has indicated his/her understanding and acceptance.       Plan Discussed with: Anesthesiologist and CRNA  Anesthesia Plan Comments:         Anesthesia Quick Evaluation

## 2022-07-14 ENCOUNTER — Encounter: Payer: BC Managed Care – PPO | Admitting: Family Medicine

## 2022-07-14 ENCOUNTER — Encounter (HOSPITAL_COMMUNITY): Payer: Self-pay | Admitting: Orthopedic Surgery

## 2022-08-16 ENCOUNTER — Other Ambulatory Visit: Payer: Self-pay | Admitting: Family Medicine

## 2022-08-19 ENCOUNTER — Other Ambulatory Visit (HOSPITAL_COMMUNITY): Payer: Self-pay

## 2022-08-19 NOTE — Telephone Encounter (Signed)
Submitted a prior authorization for Prolia to [employer] via fax at [phone].

## 2022-08-24 ENCOUNTER — Other Ambulatory Visit (HOSPITAL_COMMUNITY): Payer: Self-pay

## 2022-08-24 NOTE — Telephone Encounter (Signed)
Patient Advocate Encounter  Prior Authorization for Prolia for medical benefit has been approved.    PA# [id number] Effective dates: 08/19/22 through 08/18/23    Received notification that prior authorization for Prolia is required/requested for pharmacy benefits.  Per Test Claim: Prior authorization required   PA submitted on 08/24/22 to (ins) [employer] via CoverMyMeds Key # [id number] Status is pending

## 2022-08-25 NOTE — Telephone Encounter (Signed)
What will be cost for patient?

## 2022-09-07 NOTE — Telephone Encounter (Signed)
Checking status.

## 2022-09-09 ENCOUNTER — Other Ambulatory Visit (HOSPITAL_COMMUNITY): Payer: Self-pay

## 2022-09-09 NOTE — Telephone Encounter (Signed)
Mychart message sent.

## 2022-09-09 NOTE — Telephone Encounter (Signed)
Pt ready for scheduling for Prolia 60mg  on or after : 09/09/22  Out-of-pocket cost due at time of visit: $0  Primary: [employer] Commercial Prolia co-insurance: 0% Admin fee co-insurance: 0%  Secondary: --- Prolia co-insurance:  Admin fee co-insurance:   Medical Benefit Details: Date Benefits were checked: 07/13/22 Deductible: NO/ Coinsurance: 0%/ Admin Fee: 0%  Prior Auth: APPROVED PA# [id number]  Expiration Date: 08/18/23   Pharmacy benefit: Copay $--- If patient wants fill through the pharmacy benefit please send prescription to:  --- , and include estimated need by date in rx notes. Pharmacy will ship medication directly to the office.  Patient is eligible for Prolia Copay Card. Copay Card can make patient's cost as little as $25. Link to apply: [email or web address]://[email or web address]  ** This summary of benefits is an estimation of the patient's out-of-pocket cost. Exact cost may very based on individual plan coverage.

## 2022-09-12 NOTE — Telephone Encounter (Signed)
Left message on machine to call back to schedule for prolia. 

## 2022-09-14 ENCOUNTER — Other Ambulatory Visit: Payer: Self-pay | Admitting: Family Medicine

## 2022-09-14 ENCOUNTER — Telehealth: Payer: Self-pay | Admitting: Family Medicine

## 2022-09-14 DIAGNOSIS — M069 Rheumatoid arthritis, unspecified: Secondary | ICD-10-CM

## 2022-09-14 MED ORDER — HYDROCODONE-ACETAMINOPHEN 5-325 MG PO TABS: 1.0000 | ORAL_TABLET | Freq: Four times a day (QID) | ORAL | 0 refills | Status: DC | PRN

## 2022-09-14 NOTE — Telephone Encounter (Signed)
Prescription Request  09/14/2022  Is this a "Controlled Substance" medicine? Yes  LOV: Visit date not found  What is the name of the medication or equipment?  HYDROcodone-acetaminophen (NORCO/VICODIN) 5-325 MG tablet  Have you contacted your pharmacy to request a refill? No   Which pharmacy would you like this sent to?  [id number] - [place] ([place]), [place] - [address] [place] ([place] [address] Phone: [phone] Fax: [phone]     Patient notified that their request is being sent to the clinical staff for review and that they should receive a response within 2 business days.   Please advise at [facility] [phone]

## 2022-09-19 ENCOUNTER — Encounter: Payer: Self-pay | Admitting: *Deleted

## 2022-09-22 ENCOUNTER — Encounter: Payer: BC Managed Care – PPO | Admitting: Family Medicine

## 2022-10-02 ENCOUNTER — Other Ambulatory Visit: Payer: Self-pay | Admitting: Family Medicine

## 2022-10-04 ENCOUNTER — Ambulatory Visit (INDEPENDENT_AMBULATORY_CARE_PROVIDER_SITE_OTHER): Payer: BC Managed Care – PPO | Admitting: Podiatry

## 2022-10-04 VITALS — BP 135/84

## 2022-10-04 DIAGNOSIS — M2012 Hallux valgus (acquired), left foot: Secondary | ICD-10-CM

## 2022-10-04 DIAGNOSIS — Q828 Other specified congenital malformations of skin: Secondary | ICD-10-CM

## 2022-10-04 DIAGNOSIS — B351 Tinea unguium: Secondary | ICD-10-CM | POA: Diagnosis not present

## 2022-10-04 DIAGNOSIS — M2011 Hallux valgus (acquired), right foot: Secondary | ICD-10-CM

## 2022-10-04 DIAGNOSIS — L84 Corns and callosities: Secondary | ICD-10-CM | POA: Diagnosis not present

## 2022-10-04 DIAGNOSIS — M2041 Other hammer toe(s) (acquired), right foot: Secondary | ICD-10-CM

## 2022-10-04 DIAGNOSIS — E119 Type 2 diabetes mellitus without complications: Secondary | ICD-10-CM | POA: Diagnosis not present

## 2022-10-04 DIAGNOSIS — M2042 Other hammer toe(s) (acquired), left foot: Secondary | ICD-10-CM

## 2022-10-04 DIAGNOSIS — M79676 Pain in unspecified toe(s): Secondary | ICD-10-CM | POA: Diagnosis not present

## 2022-10-05 ENCOUNTER — Other Ambulatory Visit: Payer: Self-pay | Admitting: Family Medicine

## 2022-10-06 ENCOUNTER — Encounter: Payer: Self-pay | Admitting: Podiatry

## 2022-10-06 NOTE — Progress Notes (Signed)
Subjective:  Patient ID: Jamie Cox, female    DOB: [birth year],  MRN: [id number]  Jamie Cox presents to clinic today for preventative diabetic foot care, callus(es) bilateral great toes and painful thick toenails that are difficult to trim. Painful toenails interfere with ambulation. Aggravating factors include wearing enclosed shoe gear. Pain is relieved with periodic professional debridement. Painful calluses are aggravated when weightbearing with and without shoegear. Pain is relieved with periodic professional debridement., and painful porokeratotic lesion(s) both feet and painful mycotic toenails that limit ambulation. Painful toenails interfere with ambulation. Aggravating factors include wearing enclosed shoe gear. Pain is relieved with periodic professional debridement. Painful porokeratotic lesions are aggravated when weightbearing with and without shoegear. Pain is relieved with periodic professional debridement.  Chief Complaint  Patient presents with   Nail Problem and calluses     [facility] PCP-[facility] PCP VST-3 months ago   New problem(s): None.   PCP is [name], MD.  Allergies  Allergen Reactions   Avelox [Moxifloxacin Hcl In Nacl] Diarrhea, Nausea And Vomiting and Other (See Comments)    Dizziness, Cold chills    Bactrim [Sulfamethoxazole-Trimethoprim] Hives   Doxycycline     Abdominal discomfort, anorexia   Sulfamethoxazole-Trimethoprim Hives   Augmentin [Amoxicillin-Pot Clavulanate] Hives, Itching and Swelling    Review of Systems: Negative except as noted in the HPI.  Objective: No changes noted in today's physical examination. Vitals:   10/04/22 1150  BP: 135/84   Jamie Cox is a pleasant 62 y.o. female in NAD. AAO x 3.  Vascular:  Capillary refill time to digits immediate b/l. Palpable pedal pulses b/l LE. Pedal hair present b/l. Lower extremity skin temperature gradient within normal limits. No pain with calf compression b/l. No edema noted  b/l lower extremities.   Dermatological:  Pedal skin with normal turgor, texture and tone bilaterally. No open wounds bilaterally. No interdigital macerations bilaterally. .  Toenails 1-5 b/l elongated, discolored, dystrophic, thickened, crumbly with subungual debris and tenderness to dorsal palpation.  Hyperkeratotic lesion(s) medial IPJ right great toe. No erythema, no edema, no drainage, no fluctuance noted.  Porokeratotic lesion(s) submet head 3 b/l and submet head 5 left foot. No erythema, no edema, no drainage, no fluctuance.  Musculoskeletal:  Normal muscle strength 5/5 to all lower extremity muscle groups bilaterally. No pain crepitus or joint limitation noted with ROM b/l. Hallux valgus with bunion deformity noted b/l lower extremities. Hammertoes noted to the 2-5 bilaterally.  Neurological:  Protective sensation intact 5/5 intact bilaterally with 10g monofilament b/l. Vibratory sensation intact b/l. Clonus negative b/l.  Assessment/Plan: 1. Pain due to onychomycosis of toenail   2. Porokeratosis   3. Callus   4. Controlled type 2 diabetes mellitus without complication, without long-term current use of insulin (HCC)    Orders Placed This Encounter  Procedures   For Home Use Only DME Diabetic Shoe    Dispense one pair extra depth shoes and 3 pair total contact insoles. Offload calluses submet head 3, 5 bilaterally.   -Consent given for treatment as described below: -Examined patient. -Mycotic toenails 1-5 bilaterally were debrided in length and girth with sterile nail nippers and dremel without incident. -Callus(es) bilateral great toes pared utilizing sharp debridement with sterile blade without complication or incident. Total number debrided =2. -Porokeratotic lesion(s) submet head 3 b/l and submet head 5 b/l pared and enucleated with sterile currette without incident. Total number of lesions debrided=4. -Patient/POA to call should there be question/concern in the interim.  Return in about 3 months (around 01/03/2023).  Freddie Breech, DPM

## 2022-10-20 ENCOUNTER — Other Ambulatory Visit: Payer: Self-pay | Admitting: Family Medicine

## 2022-10-21 ENCOUNTER — Other Ambulatory Visit: Payer: Self-pay | Admitting: Family Medicine

## 2022-11-07 NOTE — Assessment & Plan Note (Deleted)
Follows with pulmonary 

## 2022-11-07 NOTE — Assessment & Plan Note (Deleted)
hgba1c acceptable, minimize simple carbs. Increase exercise as tolerated. Continue current meds 

## 2022-11-07 NOTE — Assessment & Plan Note (Deleted)
Patient encouraged to maintain heart healthy diet, regular exercise, adequate sleep. Consider daily probiotics. Take medications as prescribed. Labs ordered and reviewed. MGM 2022 Dexa scan done 5 2022. Last colonoscopy 2014, repeat in 2024. Pap 2017

## 2022-11-07 NOTE — Assessment & Plan Note (Deleted)
Encouraged to get adequate exercise, calcium and vitamin d intake 

## 2022-11-08 ENCOUNTER — Encounter: Payer: BC Managed Care – PPO | Admitting: Family Medicine

## 2022-11-08 DIAGNOSIS — Z0000 Encounter for general adult medical examination without abnormal findings: Secondary | ICD-10-CM

## 2022-11-08 DIAGNOSIS — E119 Type 2 diabetes mellitus without complications: Secondary | ICD-10-CM

## 2022-11-08 DIAGNOSIS — M810 Age-related osteoporosis without current pathological fracture: Secondary | ICD-10-CM

## 2022-11-08 DIAGNOSIS — J45991 Cough variant asthma: Secondary | ICD-10-CM

## 2022-11-19 ENCOUNTER — Other Ambulatory Visit: Payer: Self-pay | Admitting: Family Medicine

## 2022-11-21 ENCOUNTER — Other Ambulatory Visit: Payer: Self-pay | Admitting: Family Medicine

## 2022-11-23 ENCOUNTER — Other Ambulatory Visit: Payer: Self-pay | Admitting: Family Medicine

## 2022-11-24 ENCOUNTER — Emergency Department (HOSPITAL_COMMUNITY): Payer: BC Managed Care – PPO

## 2022-11-24 ENCOUNTER — Inpatient Hospital Stay (HOSPITAL_COMMUNITY)
Admission: EM | Admit: 2022-11-24 | Discharge: 2022-11-26 | DRG: 871 | Disposition: A | Discharge status: Home / Self Care | Payer: BC Managed Care – PPO | Attending: Internal Medicine | Admitting: Internal Medicine

## 2022-11-24 ENCOUNTER — Encounter (HOSPITAL_COMMUNITY): Payer: Self-pay | Admitting: *Deleted

## 2022-11-24 ENCOUNTER — Other Ambulatory Visit: Payer: Self-pay

## 2022-11-24 DIAGNOSIS — Z1152 Encounter for screening for COVID-19: Secondary | ICD-10-CM

## 2022-11-24 DIAGNOSIS — E119 Type 2 diabetes mellitus without complications: Secondary | ICD-10-CM

## 2022-11-24 DIAGNOSIS — D649 Anemia, unspecified: Secondary | ICD-10-CM | POA: Diagnosis present

## 2022-11-24 DIAGNOSIS — K219 Gastro-esophageal reflux disease without esophagitis: Secondary | ICD-10-CM | POA: Diagnosis present

## 2022-11-24 DIAGNOSIS — Z888 Allergy status to other drugs, medicaments and biological substances status: Secondary | ICD-10-CM

## 2022-11-24 DIAGNOSIS — Z833 Family history of diabetes mellitus: Secondary | ICD-10-CM

## 2022-11-24 DIAGNOSIS — Z841 Family history of disorders of kidney and ureter: Secondary | ICD-10-CM

## 2022-11-24 DIAGNOSIS — Z8249 Family history of ischemic heart disease and other diseases of the circulatory system: Secondary | ICD-10-CM

## 2022-11-24 DIAGNOSIS — J45901 Unspecified asthma with (acute) exacerbation: Secondary | ICD-10-CM | POA: Diagnosis present

## 2022-11-24 DIAGNOSIS — J441 Chronic obstructive pulmonary disease with (acute) exacerbation: Secondary | ICD-10-CM | POA: Diagnosis not present

## 2022-11-24 DIAGNOSIS — Z820 Family history of epilepsy and other diseases of the nervous system: Secondary | ICD-10-CM

## 2022-11-24 DIAGNOSIS — A419 Sepsis, unspecified organism: Secondary | ICD-10-CM | POA: Diagnosis not present

## 2022-11-24 DIAGNOSIS — J189 Pneumonia, unspecified organism: Secondary | ICD-10-CM | POA: Diagnosis not present

## 2022-11-24 DIAGNOSIS — Z882 Allergy status to sulfonamides status: Secondary | ICD-10-CM

## 2022-11-24 DIAGNOSIS — Z8261 Family history of arthritis: Secondary | ICD-10-CM

## 2022-11-24 DIAGNOSIS — E669 Obesity, unspecified: Secondary | ICD-10-CM | POA: Diagnosis present

## 2022-11-24 DIAGNOSIS — D509 Iron deficiency anemia, unspecified: Secondary | ICD-10-CM | POA: Diagnosis present

## 2022-11-24 DIAGNOSIS — E785 Hyperlipidemia, unspecified: Secondary | ICD-10-CM | POA: Diagnosis present

## 2022-11-24 DIAGNOSIS — M810 Age-related osteoporosis without current pathological fracture: Secondary | ICD-10-CM | POA: Diagnosis present

## 2022-11-24 DIAGNOSIS — R000 Tachycardia, unspecified: Secondary | ICD-10-CM | POA: Diagnosis present

## 2022-11-24 DIAGNOSIS — Z79899 Other long term (current) drug therapy: Secondary | ICD-10-CM

## 2022-11-24 DIAGNOSIS — M0510 Rheumatoid lung disease with rheumatoid arthritis of unspecified site: Secondary | ICD-10-CM | POA: Diagnosis present

## 2022-11-24 DIAGNOSIS — I10 Essential (primary) hypertension: Secondary | ICD-10-CM | POA: Diagnosis present

## 2022-11-24 DIAGNOSIS — Z7952 Long term (current) use of systemic steroids: Secondary | ICD-10-CM

## 2022-11-24 LAB — CBC WITH DIFFERENTIAL/PLATELET
Abs Immature Granulocytes: 0.05 10*3/uL (ref 0.00–0.07)
Basophils Absolute: 0 10*3/uL (ref 0.0–0.1)
Basophils Relative: 0 %
Eosinophils Absolute: 0 10*3/uL (ref 0.0–0.5)
Eosinophils Relative: 0 %
HCT: 29.7 % — ABNORMAL LOW (ref 36.0–46.0)
Hemoglobin: 9.4 g/dL — ABNORMAL LOW (ref 12.0–15.0)
Immature Granulocytes: 1 %
Lymphocytes Relative: 4 %
Lymphs Abs: 0.5 10*3/uL — ABNORMAL LOW (ref 0.7–4.0)
MCH: 22.7 pg — ABNORMAL LOW (ref 26.0–34.0)
MCHC: 31.6 g/dL (ref 30.0–36.0)
MCV: 71.7 fL — ABNORMAL LOW (ref 80.0–100.0)
Monocytes Absolute: 0.6 10*3/uL (ref 0.1–1.0)
Monocytes Relative: 5 %
Neutro Abs: 9.8 10*3/uL — ABNORMAL HIGH (ref 1.7–7.7)
Neutrophils Relative %: 90 %
Platelets: 177 10*3/uL (ref 150–400)
RBC: 4.14 MIL/uL (ref 3.87–5.11)
RDW: 14.5 % (ref 11.5–15.5)
WBC: 10.9 10*3/uL — ABNORMAL HIGH (ref 4.0–10.5)
nRBC: 0 % (ref 0.0–0.2)

## 2022-11-24 LAB — COMPREHENSIVE METABOLIC PANEL
ALT: 18 U/L (ref 0–44)
AST: 17 U/L (ref 15–41)
Albumin: 3 g/dL — ABNORMAL LOW (ref 3.5–5.0)
Alkaline Phosphatase: 71 U/L (ref 38–126)
Anion gap: 12 (ref 5–15)
BUN: 12 mg/dL (ref 8–23)
CO2: 22 mmol/L (ref 22–32)
Calcium: 9.2 mg/dL (ref 8.9–10.3)
Chloride: 102 mmol/L (ref 98–111)
Creatinine, Ser: 0.88 mg/dL (ref 0.44–1.00)
GFR, Estimated: >60 mL/min (ref >60)
Glucose, Bld: 87 mg/dL (ref 70–99)
Potassium: 3.8 mmol/L (ref 3.5–5.1)
Sodium: 136 mmol/L (ref 135–145)
Total Bilirubin: 0.9 mg/dL (ref 0.3–1.2)
Total Protein: 7.3 g/dL (ref 6.5–8.1)

## 2022-11-24 LAB — LACTIC ACID, PLASMA
Lactic Acid, Venous: 0.8 mmol/L (ref 0.5–1.9)
Lactic Acid, Venous: 0.8 mmol/L (ref 0.5–1.9)

## 2022-11-24 LAB — RESP PANEL BY RT-PCR (RSV, FLU A&B, COVID)  RVPGX2
Influenza A by PCR: NEGATIVE
Influenza B by PCR: NEGATIVE
Resp Syncytial Virus by PCR: NEGATIVE
SARS Coronavirus 2 by RT PCR: NEGATIVE

## 2022-11-24 MED ORDER — SODIUM CHLORIDE 0.9 % IV SOLN
500.0000 mg | Freq: Once | INTRAVENOUS | Status: AC
  Administered 2022-11-24: 500 mg via INTRAVENOUS
  Filled 2022-11-24: qty 5

## 2022-11-24 MED ORDER — ACETAMINOPHEN 500 MG PO TABS
1000.0000 mg | ORAL_TABLET | Freq: Once | ORAL | Status: AC
  Administered 2022-11-24: 1000 mg via ORAL
  Filled 2022-11-24: qty 2

## 2022-11-24 MED ORDER — LACTATED RINGERS IV BOLUS
1000.0000 mL | Freq: Once | INTRAVENOUS | Status: AC
  Administered 2022-11-24: 1000 mL via INTRAVENOUS

## 2022-11-24 MED ORDER — SODIUM CHLORIDE 0.9 % IV SOLN
1.0000 g | Freq: Once | INTRAVENOUS | Status: AC
  Administered 2022-11-24: 1 g via INTRAVENOUS
  Filled 2022-11-24: qty 10

## 2022-11-24 MED ORDER — ALBUTEROL SULFATE (2.5 MG/3ML) 0.083% IN NEBU: 2.5000 mg | INHALATION_SOLUTION | RESPIRATORY_TRACT | Status: DC | PRN

## 2022-11-24 MED ORDER — SODIUM CHLORIDE 0.9 % IV SOLN
500.0000 mg | INTRAVENOUS | Status: DC
  Administered 2022-11-25: 500 mg via INTRAVENOUS
  Filled 2022-11-24: qty 5

## 2022-11-24 MED ORDER — ACETAMINOPHEN 325 MG PO TABS
ORAL_TABLET | ORAL | Status: AC
  Filled 2022-11-24: qty 2

## 2022-11-24 MED ORDER — SODIUM CHLORIDE 0.9 % IV SOLN
2.0000 g | INTRAVENOUS | Status: DC
  Administered 2022-11-25: 2 g via INTRAVENOUS
  Filled 2022-11-24: qty 20

## 2022-11-24 MED ORDER — ACETAMINOPHEN 325 MG PO TABS
650.0000 mg | ORAL_TABLET | Freq: Once | ORAL | Status: AC
  Administered 2022-11-24: 650 mg via ORAL

## 2022-11-24 NOTE — ED Notes (Signed)
Ambulated pt with SpO2, pt was unable to catch their breath. Pt was coughing and slightly SHOB while walking. Pts SpO2 stayed between 100-96 and HR initially started out at 96 and jumpoed when she stood up. HR remained between 115-131.

## 2022-11-24 NOTE — ED Provider Notes (Signed)
[facility] AT [facility] Provider Note   HPI: Jamie Cox is a 62 year old female with a past medical history as below presenting today with fevers, chills, cough, shortness of breath.  She reports her cough has been productive.  She reports her symptoms have been present for several weeks however over the last several days have acutely worsened.  She endorses generalized bodyaches, fatigue, and malaise with her symptoms.  She endorses a prior history of pneumonia which felt similar therefore she presented for evaluation.  She endorses using albuterol prior to arrival with minimal relief.  She takes daily prednisone.  She endorses a history of asthma.  Past Medical History:  Diagnosis Date   Allergy    Asthma    Atypical chest pain    Chronic sinusitis    Followed by Dr. Jenne Pane   Environmental allergies    GERD (gastroesophageal reflux disease)    High serum parathyroid hormone (PTH) 06/09/2016   History of chicken pox    Hypercalcemia 07/19/2015   Hypertension    Osteoporosis, unspecified    steroid induced   Personal history of other diseases of circulatory system    Rheumatoid arthritis(714.0)    Unspecified deficiency anemia    microcytic   Urticaria     Past Surgical History:  Procedure Laterality Date   NASAL SINUS SURGERY  06/2012   NASAL SINUS SURGERY     2010   OPEN REDUCTION INTERNAL FIXATION (ORIF) METACARPAL Right 07/13/2022   Procedure: OPEN REDUCTION INTERNAL FIXATION METACARPAL RIGHT SMALL FINGER;  Surgeon: Bradly Bienenstock, MD;  Location: [facility] OR;  Service: Orthopedics;  Laterality: Right;  regional with iv sedation     Social History   Tobacco Use   Smoking status: Never   Smokeless tobacco: Never  Vaping Use   Vaping Use: Never used  Substance Use Topics   Alcohol use: No    Alcohol/week: 0.0 standard drinks of alcohol   Drug use: No      Review of Systems  A complete ROS was performed with pertinent  positives/negatives noted in the HPI.   Vitals:   11/24/22 1837 11/24/22 2036  BP: (!) 151/123 (!) 146/96  Pulse: (!) 125 (!) 119  Resp: 17 20  Temp: 98.5 F (36.9 C) (!) 101.5 F (38.6 C)  SpO2: 97%     Physical Exam Vitals and nursing note reviewed.  Constitutional:      General: She is not in acute distress.    Appearance: She is well-developed.  HENT:     Head: Normocephalic and atraumatic.  Cardiovascular:     Rate and Rhythm: Regular rhythm. Tachycardia present.     Pulses: Normal pulses.     Heart sounds: Normal heart sounds. No murmur heard.    No friction rub. No gallop.  Pulmonary:     Effort: Pulmonary effort is normal. No respiratory distress.     Breath sounds: No stridor. Rhonchi and rales present.  Abdominal:     General: Abdomen is flat. There is no distension.     Tenderness: There is no abdominal tenderness. There is no guarding or rebound.  Musculoskeletal:        General: No swelling.     Cervical back: Normal range of motion.  Neurological:     General: No focal deficit present.     Mental Status: She is alert.  Psychiatric:        Mood and Affect: Mood normal.     Procedures  MDM:  Imaging/radiology results:  DG Chest 2 View  Result Date: 11/24/2022 CLINICAL DATA:  10031 Cough 10031 EXAM: CHEST - 2 VIEW COMPARISON:  Chest radiograph 10/03/2022 FINDINGS: No pleural effusion. No pneumothorax. Normal cardiac and mediastinal contours. There are hazy bibasilar airspace opacities, left-greater-than-right) which could represent atelectasis or infection. Visualized upper abdomen is unremarkable. No radiographically apparent displaced rib fractures. Vertebral body heights are maintained. IMPRESSION: Hazy bibasilar airspace opacities, left-greater-than-right, could represent atelectasis, but are suspicious for infection. Electronically Signed   By: Lorenza Cambridge M.D.   On: 11/24/2022 13:07      Lab results:  Results for orders placed or performed  during the hospital encounter of 11/24/22 (from the past 24 hour(s))  Resp panel by RT-PCR (RSV, Flu A&B, Covid) Anterior Nasal Swab     Status: None   Collection Time: 11/24/22 12:11 PM   Specimen: Anterior Nasal Swab  Result Value Ref Range   SARS Coronavirus 2 by RT PCR NEGATIVE NEGATIVE   Influenza A by PCR NEGATIVE NEGATIVE   Influenza B by PCR NEGATIVE NEGATIVE   Resp Syncytial Virus by PCR NEGATIVE NEGATIVE  Lactic acid, plasma     Status: None   Collection Time: 11/24/22 12:24 PM  Result Value Ref Range   Lactic Acid, Venous 0.8 0.5 - 1.9 mmol/L  Comprehensive metabolic panel     Status: Abnormal   Collection Time: 11/24/22 12:24 PM  Result Value Ref Range   Sodium 136 135 - 145 mmol/L   Potassium 3.8 3.5 - 5.1 mmol/L   Chloride 102 98 - 111 mmol/L   CO2 22 22 - 32 mmol/L   Glucose, Bld 87 70 - 99 mg/dL   BUN 12 8 - 23 mg/dL   Creatinine, Ser 4.09 0.44 - 1.00 mg/dL   Calcium 9.2 8.9 - 81.1 mg/dL   Total Protein 7.3 6.5 - 8.1 g/dL   Albumin 3.0 (L) 3.5 - 5.0 g/dL   AST 17 15 - 41 U/L   ALT 18 0 - 44 U/L   Alkaline Phosphatase 71 38 - 126 U/L   Total Bilirubin 0.9 0.3 - 1.2 mg/dL   GFR, Estimated >91 >47 mL/min   Anion gap 12 5 - 15  CBC with Differential     Status: Abnormal   Collection Time: 11/24/22 12:24 PM  Result Value Ref Range   WBC 10.9 (H) 4.0 - 10.5 K/uL   RBC 4.14 3.87 - 5.11 MIL/uL   Hemoglobin 9.4 (L) 12.0 - 15.0 g/dL   HCT 82.9 (L) 56.2 - 13.0 %   MCV 71.7 (L) 80.0 - 100.0 fL   MCH 22.7 (L) 26.0 - 34.0 pg   MCHC 31.6 30.0 - 36.0 g/dL   RDW 86.5 78.4 - 69.6 %   Platelets 177 150 - 400 K/uL   nRBC 0.0 0.0 - 0.2 %   Neutrophils Relative % 90 %   Neutro Abs 9.8 (H) 1.7 - 7.7 K/uL   Lymphocytes Relative 4 %   Lymphs Abs 0.5 (L) 0.7 - 4.0 K/uL   Monocytes Relative 5 %   Monocytes Absolute 0.6 0.1 - 1.0 K/uL   Eosinophils Relative 0 %   Eosinophils Absolute 0.0 0.0 - 0.5 K/uL   Basophils Relative 0 %   Basophils Absolute 0.0 0.0 - 0.1 K/uL    Immature Granulocytes 1 %   Abs Immature Granulocytes 0.05 0.00 - 0.07 K/uL  Lactic acid, plasma     Status: None   Collection Time: 11/24/22  4:25  PM  Result Value Ref Range   Lactic Acid, Venous 0.8 0.5 - 1.9 mmol/L     Key medications administered in the ER:  Medications  acetaminophen (TYLENOL) tablet 650 mg (650 mg Oral Given 11/24/22 1219)  lactated ringers bolus 1,000 mL (0 mLs Intravenous Stopped 11/24/22 1737)  cefTRIAXone (ROCEPHIN) 1 g in sodium chloride 0.9 % 100 mL IVPB (0 g Intravenous Stopped 11/24/22 1708)  azithromycin (ZITHROMAX) 500 mg in sodium chloride 0.9 % 250 mL IVPB (0 mg Intravenous Stopped 11/24/22 1900)  lactated ringers bolus 1,000 mL (1,000 mLs Intravenous New Bag/Given 11/24/22 2032)  acetaminophen (TYLENOL) tablet 1,000 mg (1,000 mg Oral Given 11/24/22 2105)    Medical decision making: -Vital signs stable. Patient afebrile, hemodynamically stable, and non-toxic appearing. -Patient's presentation is most consistent with acute presentation with potential threat to life or bodily function.. ACCACIA DIMURO is a 62 y.o. female presenting to the emergency department with fevers, chills, body aches, cough as above.  -Per chart review, patient had a crush injury to the right hand which she followed up with orthopedics in early February. -Patient is presenting today with concerns of fevers, chills, and a cough.  On arrival, patient is febrile and tachycardic.  Due to concerns for sepsis, cultures sent, lactic acid sent, and empiric antibiotics given.  Patient is hemodynamically stable will give 1 L fluid bolus.  Given Tylenol for fever.  Patient's fever and tachycardia improved.  CBC shows leukocytosis.  Patient is without urinary symptoms and is endorsing only respiratory symptoms.  Chest x-ray Maitri rotation is consistent with pneumonia.  COVID and flu testing is negative.  CMP is unremarkable.  Lactic acid is normal.  Upon reassessment, patient with persistent  tachycardia therefore given second fluid bolus.  Fevers returned therefore given additional Tylenol.  Upon ambulation, patient complained of dyspnea and got tachycardic increasing from the 110s into the 130s.  Report score is elevated to 74.  Given elevated heart score with persistent tachycardia will proceed with admission for IV antibiotics due to concerns of sepsis secondary to pneumonia.  Patient admitted to medicine.   Medical Decision Making Amount and/or Complexity of Data Reviewed Labs: ordered. Decision-making details documented in ED Course. Radiology: ordered and independent interpretation performed. Decision-making details documented in ED Course.  Risk OTC drugs. Decision regarding hospitalization.     The plan for this patient was discussed with Dr. Elpidio Anis, who voiced agreement and who oversaw evaluation and treatment of this patient.  Marta Lamas, MD Emergency Medicine, PGY-3  Note: Dragon medical dictation software was used in the creation of this note.   Clinical Impression:  1. Community acquired pneumonia, unspecified laterality          Chase Caller, MD 11/24/22 2243    Mardene Sayer, MD 11/25/22 [phone]

## 2022-11-24 NOTE — H&P (Signed)
Jamie Cox:[id number] DOB: September 12, 1960 DOA: 11/24/2022     PCP: [name], MD   Outpatient Specialists:     Pulmonary  Dr. Celine Mans   GI  Mardella Layman, MD    Patient arrived to ER on 11/24/22 at 1147 Referred by Attending Mardene Sayer, MD   Patient coming from:    home Lives alone,     Chief Complaint:   Chief Complaint  Patient presents with   Fever    HPI: Jamie Cox is a 62 y.o. female with medical history significant of RA, asthma GERD hypercalcemia osteoporosis anemia    Presented with shortness of breath and cough Comes in with cough and SOB  Cough worse with walking  Has been ongoing for 3 wks but worse for past 3 day  Not hypoxic  Inittialy tachy  Leaves alone     Denies significant ETOH intake   Does not smoke   Lab Results  Component Value Date   SARSCOV2NAA NEGATIVE 11/24/2022   SARSCOV2NAA Detected (A) 09/11/2019   SARSCOV2NAA Not Detected 08/30/2019   SARSCOV2NAA Not Detected 07/16/2019        Regarding pertinent Chronic problems:    Hyperlipidemia - on statins Lipitor (atorvastatin)  Lipid Panel     Component Value Date/Time   CHOL 180 11/30/2021 1429   TRIG 82.0 11/30/2021 1429   HDL 92.30 11/30/2021 1429   CHOLHDL 2 11/30/2021 1429   VLDL 16.4 11/30/2021 1429   LDLCALC 72 11/30/2021 1429   LDLCALC 110 (H) 03/31/2020 1108     HTN on metoprolol      DM 2 -  Lab Results  Component Value Date   HGBA1C 6.5 11/30/2021   diet controlled      Asthma - controlled on home inhalers/ nebs                          RA - on prednisone       Chronic anemia - baseline hg Hemoglobin & Hematocrit  Recent Labs    11/30/21 1429 07/13/22 1242 11/24/22 1224  HGB 10.8* 6.5* 9.4*   Iron/TIBC/Ferritin/ %Sat    Component Value Date/Time   IRON 107 12/01/2021 1616   TIBC 287.0 12/01/2021 1616   FERRITIN 637.2 (H) 12/01/2021 1616   IRONPCTSAT 37.3 12/01/2021 1616   IRONPCTSAT 37 06/11/2021 1136       While in ER:     CXR showed evidence of CAP  Started on rocephin and azithromycin  Initially tachy but improved with IV fluids  Lab Orders         Resp panel by RT-PCR (RSV, Flu A&B, Covid) Anterior Nasal Swab         Blood culture (routine x 2)         Lactic acid, plasma         Comprehensive metabolic panel         CBC with Differential        CXR - Hazy bibasilar airspace opacities, left-greater-than-right, could represent atelectasis, but are suspicious for infection.    Following Medications were ordered in ER: Medications  acetaminophen (TYLENOL) tablet 650 mg (650 mg Oral Given 11/24/22 1219)  lactated ringers bolus 1,000 mL (0 mLs Intravenous Stopped 11/24/22 1737)  cefTRIAXone (ROCEPHIN) 1 g in sodium chloride 0.9 % 100 mL IVPB (0 g Intravenous Stopped 11/24/22 1708)  azithromycin (ZITHROMAX) 500 mg in sodium chloride 0.9 % 250 mL  IVPB (0 mg Intravenous Stopped 11/24/22 1900)  lactated ringers bolus 1,000 mL (1,000 mLs Intravenous New Bag/Given 11/24/22 2032)  acetaminophen (TYLENOL) tablet 1,000 mg (1,000 mg Oral Given 11/24/22 2105)     ED Triage Vitals  Enc Vitals Group     BP 11/24/22 1202 (!) 155/81     Pulse Rate 11/24/22 1202 (!) 132     Resp 11/24/22 1202 20     Temp 11/24/22 1202 (!) 100.4 F (38 C)     Temp Source 11/24/22 1213 Oral     SpO2 11/24/22 1202 100 %     Weight 11/24/22 1207 108 lb (49 kg)     Height 11/24/22 1207 5\' 1"  (1.549 m)     Head Circumference --      Peak Flow --      Pain Score 11/24/22 1206 0     Pain Loc --      Pain Edu? --      Excl. in GC? --   TMAX(24)@     _________________________________________ Significant initial  Findings: Abnormal Labs Reviewed  COMPREHENSIVE METABOLIC PANEL - Abnormal; Notable for the following components:      Result Value   Albumin 3.0 (*)    All other components within normal limits  CBC WITH DIFFERENTIAL/PLATELET - Abnormal; Notable for the following components:   WBC 10.9 (*)     Hemoglobin 9.4 (*)    HCT 29.7 (*)    MCV 71.7 (*)    MCH 22.7 (*)    Neutro Abs 9.8 (*)    Lymphs Abs 0.5 (*)    All other components within normal limits        ECG: Ordered Personally reviewed and interpreted by me showing: HR : 127 Rhythm: Sinus tachycardia Possible Anterior infarct , age undetermined Abnormal ECG QTC 415  BNP (last 3 results) No results for input(s): "BNP" in the last 8760 hours.   COVID-19 Labs  No results for input(s): "DDIMER", "FERRITIN", "LDH", "CRP" in the last 72 hours.  Lab Results  Component Value Date   SARSCOV2NAA NEGATIVE 11/24/2022   SARSCOV2NAA Detected (A) 09/11/2019   SARSCOV2NAA Not Detected 08/30/2019   SARSCOV2NAA Not Detected 07/16/2019     ____________________ This patient meets SIRS Criteria and may be septic.    The recent clinical data is shown below. Vitals:   11/24/22 1213 11/24/22 1440 11/24/22 1837 11/24/22 2036  BP:  113/64 (!) 151/123 (!) 146/96  Pulse:  (!) 114 (!) 125 (!) 119  Resp:  16 17 20   Temp: (!) 100.5 F (38.1 C) 98.9 F (37.2 C) 98.5 F (36.9 C) (!) 101.5 F (38.6 C)  TempSrc: Oral Oral Oral Oral  SpO2:  97% 97%   Weight:      Height:        WBC     Component Value Date/Time   WBC 10.9 (H) 11/24/2022 1224   LYMPHSABS 0.5 (L) 11/24/2022 1224   LYMPHSABS 1.2 08/18/2020 1415   MONOABS 0.6 11/24/2022 1224   EOSABS 0.0 11/24/2022 1224   EOSABS 0.1 08/18/2020 1415   BASOSABS 0.0 11/24/2022 1224   BASOSABS 0.0 08/18/2020 1415    Lactic Acid, Venous    Component Value Date/Time   LATICACIDVEN 0.8 11/24/2022 1625      Procalcitonin   Ordered      UA   ordered     Results for orders placed or performed during the hospital encounter of 11/24/22  Resp panel by RT-PCR (RSV, Flu A&B,  Covid) Anterior Nasal Swab     Status: None   Collection Time: 11/24/22 12:11 PM   Specimen: Anterior Nasal Swab  Result Value Ref Range Status   SARS Coronavirus 2 by RT PCR NEGATIVE NEGATIVE Final    Influenza A by PCR NEGATIVE NEGATIVE Final   Influenza B by PCR NEGATIVE NEGATIVE Final         Resp Syncytial Virus by PCR NEGATIVE NEGATIVE Final          ABX started Antibiotics Given (last 72 hours)     Date/Time Action Medication Dose Rate   11/24/22 1638 New Bag/Given   cefTRIAXone (ROCEPHIN) 1 g in sodium chloride 0.9 % 100 mL IVPB 1 g 200 mL/hr   11/24/22 1808 New Bag/Given   azithromycin (ZITHROMAX) 500 mg in sodium chloride 0.9 % 250 mL IVPB 500 mg 250 mL/hr       No results found for the last 90 days.   VBG pending __________________________________________________________ Recent Labs  Lab 11/24/22 1224  NA 136  K 3.8  CO2 22  GLUCOSE 87  BUN 12  CREATININE 0.88  CALCIUM 9.2    Cr  stable,    Lab Results  Component Value Date   CREATININE 0.88 11/24/2022   CREATININE 0.36 (L) 07/13/2022   CREATININE 0.81 11/30/2021    Recent Labs  Lab 11/24/22 1224  AST 17  ALT 18  ALKPHOS 71  BILITOT 0.9  PROT 7.3  ALBUMIN 3.0*   Lab Results  Component Value Date   CALCIUM 9.2 11/24/2022    Plt: Lab Results  Component Value Date   PLT 177 11/24/2022       Recent Labs  Lab 11/24/22 1224  WBC 10.9*  NEUTROABS 9.8*  HGB 9.4*  HCT 29.7*  MCV 71.7*  PLT 177    HG/HCT  stable,      Component Value Date/Time   HGB 9.4 (L) 11/24/2022 1224   HGB 10.4 (L) 08/18/2020 1415   HCT 29.7 (L) 11/24/2022 1224   HCT 34.4 08/18/2020 1415   MCV 71.7 (L) 11/24/2022 1224   MCV 76 (L) 08/18/2020 1415      _______________________________________________ Hospitalist was called for admission for CAP sepsis, asthma exacerbation   The following Work up has been ordered so far:  Orders Placed This Encounter  Procedures   Resp panel by RT-PCR (RSV, Flu A&B, Covid) Anterior Nasal Swab   Blood culture (routine x 2)   DG Chest 2 View   Lactic acid, plasma   Comprehensive metabolic panel   CBC with Differential   Diet NPO time specified   Document height  and weight   Assess and Document Glasgow Coma Scale   Document vital signs within 1-hour of fluid bolus completion.  Notify provider of abnormal vital signs despite fluid resuscitation.   Refer to Sidebar Report: Sepsis Bundle ED/IP   Notify provider for difficulties obtaining IV access   Initiate Carrier Fluid Protocol   Check Pulse Oximetry while ambulating   [facility]   ED EKG   EKG 12-Lead   Insert peripheral IV X 1     OTHER Significant initial  Findings:  labs showing:     DM  labs:  HbA1C: Recent Labs    11/30/21 1429  HGBA1C 6.5       CBG (last 3)  No results for input(s): "GLUCAP" in the last 72 hours.        Cultures:    Component Value Date/Time  SDES URINE, CLEAN CATCH 01/28/2012 1621   SPECREQUEST NONE 01/28/2012 1621   CULT  01/28/2012 1621    STAPHYLOCOCCUS SPECIES (COAGULASE NEGATIVE) Note: RIFAMPIN AND GENTAMICIN SHOULD NOT BE USED AS SINGLE DRUGS FOR TREATMENT OF STAPH INFECTIONS.   REPTSTATUS 01/30/2012 FINAL 01/28/2012 1621     Radiological Exams on Admission: DG Chest 2 View  Result Date: 11/24/2022 CLINICAL DATA:  10031 Cough 10031 EXAM: CHEST - 2 VIEW COMPARISON:  Chest radiograph 10/03/2022 FINDINGS: No pleural effusion. No pneumothorax. Normal cardiac and mediastinal contours. There are hazy bibasilar airspace opacities, left-greater-than-right) which could represent atelectasis or infection. Visualized upper abdomen is unremarkable. No radiographically apparent displaced rib fractures. Vertebral body heights are maintained. IMPRESSION: Hazy bibasilar airspace opacities, left-greater-than-right, could represent atelectasis, but are suspicious for infection. Electronically Signed   By: Lorenza Cambridge M.D.   On: 11/24/2022 13:07   _______________________________________________________________________________________________________ Latest  Blood pressure (!) 146/96, pulse (!) 119, temperature (!) 101.5 F (38.6 C),  temperature source Oral, resp. rate 20, height 5\' 1"  (1.549 m), weight 49 kg, SpO2 97 %.   Vitals  labs and radiology finding personally reviewed  Review of Systems:    Pertinent positives include:  , Fevers, chills, fatigue,  shortness of breath at rest. No dyspnea on exertion,  Constitutional:  No weight loss, night sweats weight loss  HEENT:  No headaches, Difficulty swallowing,Tooth/dental problems,Sore throat,  No sneezing, itching, ear ache, nasal congestion, post nasal drip,  Cardio-vascular:  No chest pain, Orthopnea, PND, anasarca, dizziness, palpitations.no Bilateral lower extremity swelling  GI:  No heartburn, indigestion, abdominal pain, nausea, vomiting, diarrhea, change in bowel habits, loss of appetite, melena, blood in stool, hematemesis Resp:  no No excess mucus, no productive cough, No non-productive cough, No coughing up of blood.No change in color of mucus.No wheezing. Skin:  no rash or lesions. No jaundice GU:  no dysuria, change in color of urine, no urgency or frequency. No straining to urinate.  No flank pain.  Musculoskeletal:  No joint pain or no joint swelling. No decreased range of motion. No back pain.  Psych:  No change in mood or affect. No depression or anxiety. No memory loss.  Neuro: no localizing neurological complaints, no tingling, no weakness, no double vision, no gait abnormality, no slurred speech, no confusion  All systems reviewed and apart from HOPI all are negative _______________________________________________________________________________________________ Past Medical History:   Past Medical History:  Diagnosis Date   Allergy    Asthma    Atypical chest pain    Chronic sinusitis    Followed by Dr. Jenne Pane   Environmental allergies    GERD (gastroesophageal reflux disease)    High serum parathyroid hormone (PTH) 06/09/2016   History of chicken pox    Hypercalcemia 07/19/2015   Hypertension    Osteoporosis, unspecified     steroid induced   Personal history of other diseases of circulatory system    Rheumatoid arthritis(714.0)    Unspecified deficiency anemia    microcytic   Urticaria       Past Surgical History:  Procedure Laterality Date   NASAL SINUS SURGERY  06/2012   NASAL SINUS SURGERY     2010   OPEN REDUCTION INTERNAL FIXATION (ORIF) METACARPAL Right 07/13/2022   Procedure: OPEN REDUCTION INTERNAL FIXATION METACARPAL RIGHT SMALL FINGER;  Surgeon: Bradly Bienenstock, MD;  Location: [facility] OR;  Service: Orthopedics;  Laterality: Right;  regional with iv sedation    Social History:  Ambulatory   independently  reports that she has never smoked. She has never used smokeless tobacco. She reports that she does not drink alcohol and does not use drugs.     Family History:   Family History  Problem Relation Age of Onset   Liver cancer Maternal Grandmother    Hypertension Maternal Grandmother    Heart attack Maternal Grandmother    Scleroderma Mother    Heart disease Mother    Kidney disease Mother    Other Mother    Prostate cancer Father    Diabetes Father    Heart disease Father        CHF   Arthritis Father        s/p hip replacement   Cancer Father        lung cancer with mets, smoker   Lung cancer Paternal Uncle    Coronary artery disease Paternal Grandmother    Hypertension Paternal Grandmother    Liver cancer Paternal Grandmother    Hyperlipidemia Son    Alzheimer's disease Paternal Grandfather    Other Sister        Cardiomegaly   Arthritis Sister    Heart disease Sister        rare   Sleep apnea Brother    Heart disease Brother    Arthritis Daughter    Lupus Cousin    Arthritis Cousin    Colon cancer Neg Hx    Asthma Neg Hx    Allergic rhinitis Neg Hx    Eczema Neg Hx    Immunodeficiency Neg Hx    Angioedema Neg Hx    ______________________________________________________________________________________________ Allergies: Allergies  Allergen Reactions   Avelox  [Moxifloxacin Hcl In Nacl] Diarrhea, Nausea And Vomiting and Other (See Comments)    Dizziness, Cold chills    Bactrim [Sulfamethoxazole-Trimethoprim] Hives   Doxycycline     Abdominal discomfort, anorexia   Sulfamethoxazole-Trimethoprim Hives   Augmentin [Amoxicillin-Pot Clavulanate] Hives, Itching and Swelling     Prior to Admission medications   Medication Sig Start Date End Date Taking? Authorizing Provider  albuterol (VENTOLIN HFA) 108 (90 Base) MCG/ACT inhaler INHALE ONE TO TWO PUFFS INTO THE LUNGS EVERY 6 HOURS AS NEEDED FOR WHEEZING OR SHORTNESS OF BREATH Patient taking differently: Inhale 1-2 puffs into the lungs every 6 (six) hours as needed for shortness of breath or wheezing. INHALE ONE TO TWO PUFFS INTO THE LUNGS EVERY 6 HOURS AS NEEDED FOR WHEEZING OR SHORTNESS OF BREATH 01/17/22  Yes [name], MD  atorvastatin (LIPITOR) 10 MG tablet Take 1 tablet (10 mg total) by mouth daily. NEEDS APPT/LABS 11/21/22  Yes [name], MD  calcium-vitamin D (OSCAL WITH D) 500-5 MG-MCG tablet Take 1 tablet by mouth daily with breakfast.   Yes [provider]  diphenhydrAMINE (BENADRYL) 25 MG tablet Take 25 mg by mouth every 6 (six) hours as needed for allergies.   Yes [provider]  famotidine (PEPCID) 40 MG tablet TAKE 1 TABLET BY MOUTH EVERY DAY 10/20/22  Yes [name], MD  fluticasone (FLONASE) 50 MCG/ACT nasal spray Place 2 sprays into both nostrils daily. 06/21/22  Yes [name], MD  gabapentin (NEURONTIN) 300 MG capsule TAKE 1 CAPSULE BY MOUTH THREE TIMES DAILY Patient taking differently: Take 300 mg by mouth at bedtime. 10/21/22  Yes [name], MD  HEMATINIC/FOLIC ACID 324-1 MG TABS TAKE 1 TABLET BY MOUTH EVERY DAY 06/30/22  Yes [name], MD  HYDROcodone-acetaminophen (NORCO/VICODIN) 5-325 MG tablet Take 1 tablet by mouth  every 6 (six) hours as needed for moderate pain or severe pain. 09/14/22  Yes [name], MD  metoprolol succinate  (TOPROL-XL) 25 MG 24 hr tablet Take 1 tablet (25 mg total) by mouth daily. 10/05/22  Yes [name], MD  montelukast (SINGULAIR) 10 MG tablet Take 1 tablet (10 mg total) by mouth at bedtime. 10/03/22  Yes [name], MD  predniSONE (DELTASONE) 2.5 MG tablet Take 1 tablet by mouth once daily with breakfast 11/21/22  Yes [name], MD  Pseudoephedrine-Guaifenesin (MUCINEX D MAX STRENGTH) [phone] MG TB12 Take 1 tablet by mouth 2 (two) times daily.   Yes [provider]  benzonatate (TESSALON) 100 MG capsule Take 1 capsule (100 mg total) by mouth 3 (three) times daily as needed. Patient not taking: Reported on 11/24/2022 04/12/22   Sandford Craze, NP    ___________________________________________________________________________________________________ Physical Exam:    11/24/2022    8:36 PM 11/24/2022    6:37 PM 11/24/2022    2:40 PM  Vitals with BMI  Systolic 146 151 161  Diastolic 96 123 64  Pulse 119 096 114      1. General:  in No  Acute distress    Chronically ill   -appearing 2. Psychological: Alert and   Oriented 3. Head/ENT:   Dry Mucous Membranes                          Head Non traumatic, neck supple                         Poor Dentition 4. SKIN:  decreased Skin turgor,  Skin clean Dry and intact no rash    5. Heart: Regular rate and rhythm no  Murmur, no Rub or gallop 6. Lungs:   wheezes and  crackles   7. Abdomen: Soft,  non-tender, Non distended  bowel sounds present 8. Lower extremities: no clubbing, cyanosis, no  edema 9. Neurologically Grossly intact, moving all 4 extremities equally   10. MSK: Normal range of motion    Chart has been reviewed  ______________________________________________________________________________________________  Assessment/Plan  62 y.o. female with medical history significant of RA, asthma GERD hypercalcemia osteoporosis anemia  Admitted for CAP sepsis, asthma exacerbation    Present on Admission:  CAP  (community acquired pneumonia)  Anemia  Rheumatoid lung disease (HCC)  GERD (gastroesophageal reflux disease)  Asthma, chronic obstructive, with acute exacerbation (HCC)  Tachycardia  Sepsis (HCC)     CAP (community acquired pneumonia)  - -Patient presenting with  productive cough, fever     and infiltrate in   lower lobe on chest x-ray -Infiltrate on CXR and 2-3 characteristics (fever, leukocytosis, purulent sputum) are consistent with pneumonia. -This appears to be most likely community-acquired pneumonia.       will admit for treatment of CAP will start on appropriate antibiotic coverage. - Rocephin/azithromycin   Obtain:  sputum cultures,                  Obtain respiratory panel                    influenza serologies negative                  COVID PCR negative                   blood cultures and sputum cultures ordered  strep pneumo UA antigen,                   check for Legionella antigen.                Provide oxygen as needed.    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  Titrate O2 to saturation >90%. Follow patients respiratory status.  Order nfluenza PCR   VBG ordered     Controlled type 2 diabetes mellitus without complication, without long-term current use of insulin (HCC)  - Order Sensitive SSI    -  check TSH and HgA1C     Tachycardia Resume home betablocker for now given it is a chronic medication  Sepsis (HCC)  -SIRS criteria met with  elevated white blood cell count,       Component Value  Date/Time   WBC 10.9 (H) 11/24/2022 1224   LYMPHSABS 0.5 (L) 11/24/2022 1224   LYMPHSABS 1.2 08/18/2020 1415     tachycardia   ,   fever   RR >20 Today's Vitals   11/24/22 1440 11/24/22 1837 11/24/22 2036 11/24/22 2348  BP: 113/64 (!) 151/123 (!) 146/96 134/80  Pulse: (!) 114 (!) 125 (!) 119 (!) 106  Resp: 16 17 20 20   Temp: 98.9 F (37.2 C) 98.5 F (36.9 C) (!) 101.5 F (38.6 C) 98.6 F (37 C)  TempSrc: Oral Oral Oral Oral  SpO2: 97% 97%  97%  Weight:      Height:      PainSc:       Body mass index is 20.41 kg/m.     The recent clinical data is shown below. Vitals:   11/24/22 1440 11/24/22 1837 11/24/22 2036 11/24/22 2348  BP: 113/64 (!) 151/123 (!) 146/96 134/80  Pulse: (!) 114 (!) 125 (!) 119 (!) 106  Resp: 16 17 20 20   Temp: 98.9 F (37.2 C) 98.5 F (36.9 C) (!) 101.5 F (38.6 C) 98.6 F (37 C)  TempSrc: Oral Oral Oral Oral  SpO2: 97% 97%  97%  Weight:      Height:          -Most likely source being:  pulmonary       - Obtain serial lactic acid and procalcitonin level.  - Initiated IV antibiotics in ER: Antibiotics Given (last 72 hours)     Date/Time Action Medication Dose Rate   11/24/22 1638 New Bag/Given   cefTRIAXone (ROCEPHIN) 1 g in sodium chloride 0.9 % 100 mL IVPB 1 g 200 mL/hr   11/24/22 1808 New Bag/Given   azithromycin (ZITHROMAX) 500 mg in sodium chloride 0.9 % 250 mL IVPB 500 mg 250 mL/hr       Will continue  on : rocephin azithro   - await results of blood and urine culture  - Rehydrate aggressively  Intravenous fluids were administered,   12:18 AM   Other plan as per orders.  DVT prophylaxis:  SCD      Code Status:    Code Status: Prior FULL CODE  as per patient  I had personally discussed CODE STATUS with patient   ACP none   Family Communication:   Family not at  Bedside    Diet  Diet Orders (From admission, onward)     Start     Ordered   11/24/22 1622  Diet NPO time specified  (Undifferentiated presentation  (screening labs and basic nursing orders))  Diet effective now        11/24/22 1621            Disposition Plan:         To home once workup is complete and patient is stable   Following barriers for discharge:                    Anemia stable                               Afebrile, white count improving able to transition to PO antibiotics                           Consults called: none   Admission status:  ED Disposition     ED Disposition  Admit   Condition  --   Comment  Hospital Area: [facility] [[id number]]  Level of Care: Progressive [102]  Admit to Progressive based on following criteria: RESPIRATORY PROBLEMS hypoxemic/hypercapnic respiratory failure that is responsive to NIPPV (BiPAP) or High Flow Nasal Cannula (6-80 lpm). Frequent assessment/intervention, no > Q2 hrs < Q4 hrs, to maintain oxygenation and pulmonary hygiene.  May place patient in observation at [facility] or [facility] if equivalent level of care is available:: No  Covid Evaluation: Confirmed COVID Negative  Diagnosis: CAP (community acquired pneumonia) [284132]  Admitting Physician: Therisa Doyne [[id number]]  Attending Physician: Therisa Doyne [[id number]]           Obs     Level of care       progressive tele indefinitely please discontinue once patient no longer qualifies COVID-19 Labs    Lab Results  Component Value Date   SARSCOV2NAA NEGATIVE 11/24/2022     Precautions: admitted as   Covid Negative      Alana Dayton 11/25/2022, 12:20 AM    [facility]     after 2 AM please page floor coverage PA If 7AM-7PM, please contact the day team taking care of the patient using [email or web address]

## 2022-11-24 NOTE — ED Triage Notes (Addendum)
Here by POV from home for congested cough, fever noted in triage, states takes daily tylenol for arthritis, not sure of fever at home PTA. Describes cough as sometimes productive. Endorses some sob after coughing, fatigue and sore body aches. H/o bronchiectasis and PNA. Used albuterol PTA, no tylenol today, takes daily prednisone. H/o asthma.

## 2022-11-24 NOTE — Subjective & Objective (Signed)
Comes in with cough and SOB  Cough worse with walking  Has been ongoing for 3 wks but worse for past 3 day  Not hypoxic  Inittialy tachy  Leaves alone

## 2022-11-25 ENCOUNTER — Other Ambulatory Visit: Payer: Self-pay | Admitting: Family Medicine

## 2022-11-25 DIAGNOSIS — Z833 Family history of diabetes mellitus: Secondary | ICD-10-CM | POA: Diagnosis not present

## 2022-11-25 DIAGNOSIS — E119 Type 2 diabetes mellitus without complications: Secondary | ICD-10-CM | POA: Diagnosis present

## 2022-11-25 DIAGNOSIS — Z1152 Encounter for screening for COVID-19: Secondary | ICD-10-CM | POA: Diagnosis not present

## 2022-11-25 DIAGNOSIS — Z888 Allergy status to other drugs, medicaments and biological substances status: Secondary | ICD-10-CM | POA: Diagnosis not present

## 2022-11-25 DIAGNOSIS — I10 Essential (primary) hypertension: Secondary | ICD-10-CM | POA: Diagnosis present

## 2022-11-25 DIAGNOSIS — Z79899 Other long term (current) drug therapy: Secondary | ICD-10-CM | POA: Diagnosis not present

## 2022-11-25 DIAGNOSIS — Z8249 Family history of ischemic heart disease and other diseases of the circulatory system: Secondary | ICD-10-CM | POA: Diagnosis not present

## 2022-11-25 DIAGNOSIS — M810 Age-related osteoporosis without current pathological fracture: Secondary | ICD-10-CM | POA: Diagnosis present

## 2022-11-25 DIAGNOSIS — J45901 Unspecified asthma with (acute) exacerbation: Secondary | ICD-10-CM | POA: Diagnosis present

## 2022-11-25 DIAGNOSIS — Z841 Family history of disorders of kidney and ureter: Secondary | ICD-10-CM | POA: Diagnosis not present

## 2022-11-25 DIAGNOSIS — D509 Iron deficiency anemia, unspecified: Secondary | ICD-10-CM | POA: Diagnosis present

## 2022-11-25 DIAGNOSIS — E785 Hyperlipidemia, unspecified: Secondary | ICD-10-CM | POA: Diagnosis present

## 2022-11-25 DIAGNOSIS — Z882 Allergy status to sulfonamides status: Secondary | ICD-10-CM | POA: Diagnosis not present

## 2022-11-25 DIAGNOSIS — A419 Sepsis, unspecified organism: Secondary | ICD-10-CM | POA: Diagnosis present

## 2022-11-25 DIAGNOSIS — J189 Pneumonia, unspecified organism: Secondary | ICD-10-CM | POA: Diagnosis present

## 2022-11-25 DIAGNOSIS — Z8261 Family history of arthritis: Secondary | ICD-10-CM | POA: Diagnosis not present

## 2022-11-25 DIAGNOSIS — J441 Chronic obstructive pulmonary disease with (acute) exacerbation: Secondary | ICD-10-CM | POA: Diagnosis not present

## 2022-11-25 DIAGNOSIS — Z7952 Long term (current) use of systemic steroids: Secondary | ICD-10-CM | POA: Diagnosis not present

## 2022-11-25 DIAGNOSIS — M0510 Rheumatoid lung disease with rheumatoid arthritis of unspecified site: Secondary | ICD-10-CM | POA: Diagnosis present

## 2022-11-25 DIAGNOSIS — Z820 Family history of epilepsy and other diseases of the nervous system: Secondary | ICD-10-CM | POA: Diagnosis not present

## 2022-11-25 DIAGNOSIS — K219 Gastro-esophageal reflux disease without esophagitis: Secondary | ICD-10-CM | POA: Diagnosis present

## 2022-11-25 LAB — CBG MONITORING, ED
Glucose-Capillary: 106 mg/dL — ABNORMAL HIGH (ref 70–99)
Glucose-Capillary: 119 mg/dL — ABNORMAL HIGH (ref 70–99)
Glucose-Capillary: 123 mg/dL — ABNORMAL HIGH (ref 70–99)
Glucose-Capillary: 87 mg/dL (ref 70–99)

## 2022-11-25 LAB — I-STAT VENOUS BLOOD GAS, ED
Acid-base deficit: 1 mmol/L (ref 0.0–2.0)
Bicarbonate: 22.6 mmol/L (ref 20.0–28.0)
Calcium, Ion: 1.14 mmol/L — ABNORMAL LOW (ref 1.15–1.40)
HCT: 29 % — ABNORMAL LOW (ref 36.0–46.0)
Hemoglobin: 9.9 g/dL — ABNORMAL LOW (ref 12.0–15.0)
O2 Saturation: 86 %
Potassium: 3.7 mmol/L (ref 3.5–5.1)
Sodium: 136 mmol/L (ref 135–145)
TCO2: 24 mmol/L (ref 22–32)
pCO2, Ven: 32.2 mmHg — ABNORMAL LOW (ref 44–60)
pH, Ven: 7.454 — ABNORMAL HIGH (ref 7.25–7.43)
pO2, Ven: 48 mmHg — ABNORMAL HIGH (ref 32–45)

## 2022-11-25 LAB — CBC WITH DIFFERENTIAL/PLATELET
Abs Immature Granulocytes: 0.05 10*3/uL (ref 0.00–0.07)
Basophils Absolute: 0 10*3/uL (ref 0.0–0.1)
Basophils Relative: 0 %
Eosinophils Absolute: 0 10*3/uL (ref 0.0–0.5)
Eosinophils Relative: 0 %
HCT: 29.1 % — ABNORMAL LOW (ref 36.0–46.0)
Hemoglobin: 9.3 g/dL — ABNORMAL LOW (ref 12.0–15.0)
Immature Granulocytes: 1 %
Lymphocytes Relative: 8 %
Lymphs Abs: 0.9 10*3/uL (ref 0.7–4.0)
MCH: 22.8 pg — ABNORMAL LOW (ref 26.0–34.0)
MCHC: 32 g/dL (ref 30.0–36.0)
MCV: 71.3 fL — ABNORMAL LOW (ref 80.0–100.0)
Monocytes Absolute: 0.7 10*3/uL (ref 0.1–1.0)
Monocytes Relative: 7 %
Neutro Abs: 9.1 10*3/uL — ABNORMAL HIGH (ref 1.7–7.7)
Neutrophils Relative %: 84 %
Platelets: 148 10*3/uL — ABNORMAL LOW (ref 150–400)
RBC: 4.08 MIL/uL (ref 3.87–5.11)
RDW: 14.4 % (ref 11.5–15.5)
WBC: 10.7 10*3/uL — ABNORMAL HIGH (ref 4.0–10.5)
nRBC: 0 % (ref 0.0–0.2)

## 2022-11-25 LAB — COMPREHENSIVE METABOLIC PANEL
ALT: 20 U/L (ref 0–44)
AST: 24 U/L (ref 15–41)
Albumin: 2.6 g/dL — ABNORMAL LOW (ref 3.5–5.0)
Alkaline Phosphatase: 72 U/L (ref 38–126)
Anion gap: 15 (ref 5–15)
BUN: 8 mg/dL (ref 8–23)
CO2: 20 mmol/L — ABNORMAL LOW (ref 22–32)
Calcium: 9 mg/dL (ref 8.9–10.3)
Chloride: 102 mmol/L (ref 98–111)
Creatinine, Ser: 0.75 mg/dL (ref 0.44–1.00)
GFR, Estimated: >60 mL/min (ref >60)
Glucose, Bld: 93 mg/dL (ref 70–99)
Potassium: 3.8 mmol/L (ref 3.5–5.1)
Sodium: 137 mmol/L (ref 135–145)
Total Bilirubin: 0.7 mg/dL (ref 0.3–1.2)
Total Protein: 6.5 g/dL (ref 6.5–8.1)

## 2022-11-25 LAB — RESPIRATORY PANEL BY PCR

## 2022-11-25 LAB — PREALBUMIN: Prealbumin: 6 mg/dL — ABNORMAL LOW (ref 18–38)

## 2022-11-25 LAB — PROCALCITONIN: Procalcitonin: 0.53 ng/mL

## 2022-11-25 LAB — PROTIME-INR
INR: 1 (ref 0.8–1.2)
Prothrombin Time: 13.8 seconds (ref 11.4–15.2)

## 2022-11-25 LAB — GLUCOSE, CAPILLARY
Glucose-Capillary: 142 mg/dL — ABNORMAL HIGH (ref 70–99)
Glucose-Capillary: 152 mg/dL — ABNORMAL HIGH (ref 70–99)

## 2022-11-25 LAB — HIV ANTIBODY (ROUTINE TESTING W REFLEX): HIV Screen 4th Generation wRfx: NONREACTIVE

## 2022-11-25 LAB — VITAMIN B12: Vitamin B-12: 641 pg/mL (ref 180–914)

## 2022-11-25 LAB — MAGNESIUM: Magnesium: 1.7 mg/dL (ref 1.7–2.4)

## 2022-11-25 LAB — RETICULOCYTES
Immature Retic Fract: 14.2 % (ref 2.3–15.9)
RBC.: 4.08 MIL/uL (ref 3.87–5.11)
Retic Count, Absolute: 31.8 10*3/uL (ref 19.0–186.0)
Retic Ct Pct: 0.8 % (ref 0.4–3.1)

## 2022-11-25 LAB — PHOSPHORUS: Phosphorus: 2.9 mg/dL (ref 2.5–4.6)

## 2022-11-25 LAB — IRON AND TIBC
Iron: 11 ug/dL — ABNORMAL LOW (ref 28–170)
Saturation Ratios: 6 % — ABNORMAL LOW (ref 10.4–31.8)
TIBC: 189 ug/dL — ABNORMAL LOW (ref 250–450)
UIBC: 178 ug/dL

## 2022-11-25 LAB — APTT: aPTT: 28 seconds (ref 24–36)

## 2022-11-25 LAB — HEMOGLOBIN A1C
Hgb A1c MFr Bld: 5.9 % — ABNORMAL HIGH (ref 4.8–5.6)
Mean Plasma Glucose: 122.63 mg/dL

## 2022-11-25 LAB — MRSA NEXT GEN BY PCR, NASAL: MRSA by PCR Next Gen: NOT DETECTED

## 2022-11-25 LAB — CK: Total CK: 96 U/L (ref 38–234)

## 2022-11-25 LAB — FERRITIN: Ferritin: 1348 ng/mL — ABNORMAL HIGH (ref 11–307)

## 2022-11-25 LAB — FOLATE: Folate: 26.6 ng/mL (ref >5.9)

## 2022-11-25 LAB — TSH: TSH: 0.649 u[IU]/mL (ref 0.350–4.500)

## 2022-11-25 MED ORDER — METHYLPREDNISOLONE SODIUM SUCC 40 MG IJ SOLR
40.0000 mg | Freq: Two times a day (BID) | INTRAMUSCULAR | Status: AC
  Administered 2022-11-25 (×2): 40 mg via INTRAVENOUS
  Filled 2022-11-25 (×2): qty 1

## 2022-11-25 MED ORDER — ONDANSETRON HCL 4 MG/2ML IJ SOLN: 4.0000 mg | Freq: Four times a day (QID) | INTRAMUSCULAR | Status: DC | PRN

## 2022-11-25 MED ORDER — HYDROCODONE-ACETAMINOPHEN 5-325 MG PO TABS: 1.0000 | ORAL_TABLET | ORAL | Status: DC | PRN

## 2022-11-25 MED ORDER — IPRATROPIUM-ALBUTEROL 0.5-2.5 (3) MG/3ML IN SOLN: 3.0000 mL | Freq: Three times a day (TID) | RESPIRATORY_TRACT | Status: DC

## 2022-11-25 MED ORDER — PANTOPRAZOLE SODIUM 40 MG PO TBEC
40.0000 mg | DELAYED_RELEASE_TABLET | Freq: Every day | ORAL | Status: DC
  Administered 2022-11-25: 40 mg via ORAL
  Filled 2022-11-25: qty 1

## 2022-11-25 MED ORDER — SODIUM CHLORIDE 0.9 % IV SOLN
250.0000 mg | Freq: Every day | INTRAVENOUS | Status: AC
  Administered 2022-11-25 – 2022-11-26 (×2): 250 mg via INTRAVENOUS
  Filled 2022-11-25 (×2): qty 20

## 2022-11-25 MED ORDER — GABAPENTIN 300 MG PO CAPS
300.0000 mg | ORAL_CAPSULE | Freq: Every day | ORAL | Status: DC
  Administered 2022-11-25 (×2): 300 mg via ORAL
  Filled 2022-11-25 (×2): qty 1

## 2022-11-25 MED ORDER — ACETAMINOPHEN 650 MG RE SUPP: 650.0000 mg | Freq: Four times a day (QID) | RECTAL | Status: DC | PRN

## 2022-11-25 MED ORDER — PREDNISONE 20 MG PO TABS
40.0000 mg | ORAL_TABLET | Freq: Every day | ORAL | Status: DC
  Administered 2022-11-26: 40 mg via ORAL
  Filled 2022-11-25: qty 2

## 2022-11-25 MED ORDER — GUAIFENESIN-DM 100-10 MG/5ML PO SYRP
5.0000 mL | ORAL_SOLUTION | ORAL | Status: DC | PRN
  Administered 2022-11-25 – 2022-11-26 (×2): 5 mL via ORAL
  Filled 2022-11-25: qty 5

## 2022-11-25 MED ORDER — ONDANSETRON HCL 4 MG PO TABS: 4.0000 mg | ORAL_TABLET | Freq: Four times a day (QID) | ORAL | Status: DC | PRN

## 2022-11-25 MED ORDER — ALBUTEROL SULFATE (2.5 MG/3ML) 0.083% IN NEBU: 2.5000 mg | INHALATION_SOLUTION | RESPIRATORY_TRACT | Status: DC | PRN

## 2022-11-25 MED ORDER — MONTELUKAST SODIUM 10 MG PO TABS
10.0000 mg | ORAL_TABLET | Freq: Every day | ORAL | Status: DC
  Administered 2022-11-25 (×2): 10 mg via ORAL
  Filled 2022-11-25 (×2): qty 1

## 2022-11-25 MED ORDER — FAMOTIDINE 20 MG PO TABS
40.0000 mg | ORAL_TABLET | Freq: Every day | ORAL | Status: DC
  Administered 2022-11-25 – 2022-11-26 (×2): 40 mg via ORAL
  Filled 2022-11-25 (×2): qty 2

## 2022-11-25 MED ORDER — ATORVASTATIN CALCIUM 10 MG PO TABS
10.0000 mg | ORAL_TABLET | Freq: Every day | ORAL | Status: DC
  Administered 2022-11-25 – 2022-11-26 (×2): 10 mg via ORAL
  Filled 2022-11-25 (×2): qty 1

## 2022-11-25 MED ORDER — HYDROCOD POLI-CHLORPHE POLI ER 10-8 MG/5ML PO SUER
5.0000 mL | Freq: Two times a day (BID) | ORAL | Status: DC
  Administered 2022-11-25 – 2022-11-26 (×3): 5 mL via ORAL
  Filled 2022-11-25 (×3): qty 5

## 2022-11-25 MED ORDER — INSULIN ASPART 100 UNIT/ML IJ SOLN
0.0000 [IU] | INTRAMUSCULAR | Status: DC
  Administered 2022-11-25 (×2): 1 [IU] via SUBCUTANEOUS
  Administered 2022-11-25: 2 [IU] via SUBCUTANEOUS
  Administered 2022-11-26: 1 [IU] via SUBCUTANEOUS

## 2022-11-25 MED ORDER — SODIUM CHLORIDE 0.9 % IV SOLN: INTRAVENOUS | Status: AC

## 2022-11-25 MED ORDER — METOPROLOL SUCCINATE ER 25 MG PO TB24
25.0000 mg | ORAL_TABLET | Freq: Every day | ORAL | Status: DC
  Administered 2022-11-25 – 2022-11-26 (×2): 25 mg via ORAL
  Filled 2022-11-25 (×2): qty 1

## 2022-11-25 MED ORDER — ACETAMINOPHEN 325 MG PO TABS: 650.0000 mg | ORAL_TABLET | Freq: Four times a day (QID) | ORAL | Status: DC | PRN

## 2022-11-25 MED ORDER — GUAIFENESIN ER 600 MG PO TB12
600.0000 mg | ORAL_TABLET | Freq: Two times a day (BID) | ORAL | Status: DC
  Administered 2022-11-25 – 2022-11-26 (×4): 600 mg via ORAL
  Filled 2022-11-25 (×4): qty 1

## 2022-11-25 MED ORDER — IPRATROPIUM-ALBUTEROL 0.5-2.5 (3) MG/3ML IN SOLN
3.0000 mL | Freq: Four times a day (QID) | RESPIRATORY_TRACT | Status: DC
  Administered 2022-11-25 (×4): 3 mL via RESPIRATORY_TRACT
  Filled 2022-11-25 (×5): qty 3

## 2022-11-25 NOTE — Progress Notes (Signed)
At bedside for IV start request.  Patient currently has working [facility] IV which she states is not bothering her at this time.  Discussed that IV team would be glad to change it out but she declines at this time.  Discussed with [place] Bing, RN regarding IV consult for start.  Patient currently has 3 IV medications that are each q24 hours which can be staggered to avoid a second IV in the patient which increases her risk of bloodstream infection.  Instructed to re enter consult if patient condition changes or if she is ready to have AC IV moved.

## 2022-11-25 NOTE — Assessment & Plan Note (Signed)
-   Order Sensitive SSI  °  ° -  check TSH and HgA1C °  ° ° °

## 2022-11-25 NOTE — Assessment & Plan Note (Signed)
- -  Patient presenting with  productive cough, fever     and infiltrate in   lower lobe on chest x-ray -Infiltrate on CXR and 2-3 characteristics (fever, leukocytosis, purulent sputum) are consistent with pneumonia. -This appears to be most likely community-acquired pneumonia.      will admit for treatment of CAP will start on appropriate antibiotic coverage. - Rocephin/azithromycin   Obtain:  sputum cultures,                  Obtain respiratory panel                    influenza serologies negative                  COVID PCR negative                    blood cultures and sputum cultures ordered                   strep pneumo UA antigen,                   check for Legionella antigen.                Provide oxygen as needed.   

## 2022-11-25 NOTE — Assessment & Plan Note (Signed)
Cont protonix 40mg po q day 

## 2022-11-25 NOTE — Assessment & Plan Note (Signed)
Followed by pulmonology  On steroids  Given asthma exacerbation will add steroid taper  If no improvement would need pulmonology consult

## 2022-11-25 NOTE — ED Notes (Signed)
ED TO INPATIENT HANDOFF REPORT  ED Nurse Name and Phone #:   S Name/Age/Gender Jamie Cox 62 y.o. female Room/Bed: 041C/041C  Code Status   Code Status: Full Code  Home/SNF/Other Home Patient oriented to: self, place, time, and situation Is this baseline? Yes   Triage Complete: Triage complete  Chief Complaint CAP (community acquired pneumonia) [J18.9]  Triage Note Here by POV from home for congested cough, fever noted in triage, states takes daily tylenol for arthritis, not sure of fever at home PTA. Describes cough as sometimes productive. Endorses some sob after coughing, fatigue and sore body aches. H/o bronchiectasis and PNA. Used albuterol PTA, no tylenol today, takes daily prednisone. H/o asthma.    Allergies Allergies  Allergen Reactions   Avelox [Moxifloxacin Hcl In Nacl] Diarrhea, Nausea And Vomiting and Other (See Comments)    Dizziness, Cold chills    Bactrim [Sulfamethoxazole-Trimethoprim] Hives   Doxycycline     Abdominal discomfort, anorexia   Sulfamethoxazole-Trimethoprim Hives   Augmentin [Amoxicillin-Pot Clavulanate] Hives, Itching and Swelling    Level of Care/Admitting Diagnosis ED Disposition     ED Disposition  Admit   Condition  --   Comment  Hospital Area: [facility] [[id number]]  Level of Care: Progressive [102]  Admit to Progressive based on following criteria: RESPIRATORY PROBLEMS hypoxemic/hypercapnic respiratory failure that is responsive to NIPPV (BiPAP) or High Flow Nasal Cannula (6-80 lpm). Frequent assessment/intervention, no > Q2 hrs < Q4 hrs, to maintain oxygenation and pulmonary hygiene.  May place patient in [facility] at [facility] or [facility] if equivalent level of care is available:: No  Covid Evaluation: Confirmed COVID Negative  Diagnosis: CAP (community acquired pneumonia) [409811]  Admitting Physician: Therisa Doyne [[id number]]  Attending Physician: Therisa Doyne [[id number]]           B Medical/Surgery History Past Medical History:  Diagnosis Date   Allergy    Asthma    Atypical chest pain    Chronic sinusitis    Followed by Dr. Jenne Pane   Environmental allergies    GERD (gastroesophageal reflux disease)    High serum parathyroid hormone (PTH) 06/09/2016   History of chicken pox    Hypercalcemia 07/19/2015   Hypertension    Osteoporosis, unspecified    steroid induced   Personal history of other diseases of circulatory system    Rheumatoid arthritis(714.0)    Unspecified deficiency anemia    microcytic   Urticaria    Past Surgical History:  Procedure Laterality Date   NASAL SINUS SURGERY  06/2012   NASAL SINUS SURGERY     2010   OPEN REDUCTION INTERNAL FIXATION (ORIF) METACARPAL Right 07/13/2022   Procedure: OPEN REDUCTION INTERNAL FIXATION METACARPAL RIGHT SMALL FINGER;  Surgeon: Bradly Bienenstock, MD;  Location: [facility] OR;  Service: Orthopedics;  Laterality: Right;  regional with iv sedation     A IV Location/Drains/Wounds Patient Lines/Drains/Airways Status     Active Line/Drains/Airways     Name Placement date Placement time Site Days   Peripheral IV 11/24/22 20 G 1" Right Antecubital 11/24/22  1637  Antecubital  1            Intake/Output Last 24 hours  Intake/Output Summary (Last 24 hours) at 11/25/2022 1200 Last data filed at 11/24/2022 2336 Gross per 24 hour  Intake 1000.21 ml  Output --  Net 1000.21 ml    Labs/Imaging Results for orders placed or performed during the hospital encounter of 11/24/22 (from the past 48 hour(s))  Resp panel  by RT-PCR (RSV, Flu A&B, Covid) Anterior Nasal Swab     Status: None   Collection Time: 11/24/22 12:11 PM   Specimen: Anterior Nasal Swab  Result Value Ref Range   SARS Coronavirus 2 by RT PCR NEGATIVE NEGATIVE   Influenza A by PCR NEGATIVE NEGATIVE   Influenza B by PCR NEGATIVE NEGATIVE    Comment: (NOTE) The Xpert Xpress SARS-CoV-2/FLU/RSV plus assay is intended as an aid in the diagnosis of  influenza from Nasopharyngeal swab specimens and should not be used as a sole basis for treatment. Nasal washings and aspirates are unacceptable for Xpert Xpress SARS-CoV-2/FLU/RSV testing.  Fact Sheet for Patients: BloggerCourse.com  Fact Sheet for Healthcare Providers: SeriousBroker.it  This test is not yet approved or cleared by the [place] FDA and has been authorized for detection and/or diagnosis of SARS-CoV-2 by FDA under an Emergency Use Authorization (EUA). This EUA will remain in effect (meaning this test can be used) for the duration of the COVID-19 declaration under Section 564(b)(1) of the Act, 21 U.S.C. section 360bbb-3(b)(1), unless the authorization is terminated or revoked.     Resp Syncytial Virus by PCR NEGATIVE NEGATIVE    Comment: (NOTE) Fact Sheet for Patients: BloggerCourse.com  Fact Sheet for Healthcare Providers: SeriousBroker.it  This test is not yet approved or cleared by the [place] FDA and has been authorized for detection and/or diagnosis of SARS-CoV-2 by FDA under an Emergency Use Authorization (EUA). This EUA will remain in effect (meaning this test can be used) for the duration of the COVID-19 declaration under Section 564(b)(1) of the Act, 21 U.S.C. section 360bbb-3(b)(1), unless the authorization is terminated or revoked.  Performed at [facility], [address] [place], [place] [address]   Lactic acid, plasma     Status: None   Collection Time: 11/24/22 12:24 PM  Result Value Ref Range   Lactic Acid, Venous 0.8 0.5 - 1.9 mmol/L    Comment: Performed at [facility], [address] [place], [place] [address]  Comprehensive metabolic panel     Status: Abnormal   Collection Time: 11/24/22 12:24 PM  Result Value Ref Range   Sodium 136 135 - 145 mmol/L   Potassium 3.8 3.5 - 5.1 mmol/L   Chloride 102 98 - 111 mmol/L    CO2 22 22 - 32 mmol/L   Glucose, Bld 87 70 - 99 mg/dL    Comment: Glucose reference range applies only to samples taken after fasting for at least 8 hours.   BUN 12 8 - 23 mg/dL   Creatinine, Ser 8.29 0.44 - 1.00 mg/dL   Calcium 9.2 8.9 - 56.2 mg/dL   Total Protein 7.3 6.5 - 8.1 g/dL   Albumin 3.0 (L) 3.5 - 5.0 g/dL   AST 17 15 - 41 U/L   ALT 18 0 - 44 U/L   Alkaline Phosphatase 71 38 - 126 U/L   Total Bilirubin 0.9 0.3 - 1.2 mg/dL   GFR, Estimated >13 >08 mL/min    Comment: (NOTE) Calculated using the CKD-EPI Creatinine Equation (2021)    Anion gap 12 5 - 15    Comment: Performed at [facility], [address] [place], [place] [address]  CBC with Differential     Status: Abnormal   Collection Time: 11/24/22 12:24 PM  Result Value Ref Range   WBC 10.9 (H) 4.0 - 10.5 K/uL   RBC 4.14 3.87 - 5.11 MIL/uL   Hemoglobin 9.4 (L) 12.0 - 15.0  g/dL   HCT 13.2 (L) 44.0 - 10.2 %   MCV 71.7 (L) 80.0 - 100.0 fL   MCH 22.7 (L) 26.0 - 34.0 pg   MCHC 31.6 30.0 - 36.0 g/dL   RDW 72.5 36.6 - 44.0 %   Platelets 177 150 - 400 K/uL    Comment: REPEATED TO VERIFY   nRBC 0.0 0.0 - 0.2 %   Neutrophils Relative % 90 %   Neutro Abs 9.8 (H) 1.7 - 7.7 K/uL   Lymphocytes Relative 4 %   Lymphs Abs 0.5 (L) 0.7 - 4.0 K/uL   Monocytes Relative 5 %   Monocytes Absolute 0.6 0.1 - 1.0 K/uL   Eosinophils Relative 0 %   Eosinophils Absolute 0.0 0.0 - 0.5 K/uL   Basophils Relative 0 %   Basophils Absolute 0.0 0.0 - 0.1 K/uL   Immature Granulocytes 1 %   Abs Immature Granulocytes 0.05 0.00 - 0.07 K/uL    Comment: Performed at [facility], [address] [place], [place] [address]  Lactic acid, plasma     Status: None   Collection Time: 11/24/22  4:25 PM  Result Value Ref Range   Lactic Acid, Venous 0.8 0.5 - 1.9 mmol/L    Comment: Performed at [facility], [address], [place], [place] [address]  Blood culture (routine x 2)     Status: None (Preliminary result)   Collection  Time: 11/24/22  4:25 PM   Specimen: BLOOD  Result Value Ref Range   Specimen Description BLOOD SITE NOT SPECIFIED    Special Requests      BOTTLES DRAWN AEROBIC AND ANAEROBIC Blood Culture adequate volume   Culture      NO GROWTH < 24 HOURS Performed at [facility], [address] [place], [place] [address]    Report Status PENDING   Blood culture (routine x 2)     Status: None (Preliminary result)   Collection Time: 11/24/22  4:35 PM   Specimen: BLOOD LEFT ARM  Result Value Ref Range   Specimen Description BLOOD LEFT ARM    Special Requests      BOTTLES DRAWN AEROBIC AND ANAEROBIC Blood Culture adequate volume   Culture      NO GROWTH < 24 HOURS Performed at [facility], [address], [place], [place] [address]    Report Status PENDING   HIV Antibody (routine testing w rflx)     Status: None   Collection Time: 11/25/22 12:11 AM  Result Value Ref Range   HIV Screen 4th Generation wRfx Non Reactive Non Reactive    Comment: Performed at [facility], [address], [place], [place] [address]  Protime-INR     Status: None   Collection Time: 11/25/22 12:11 AM  Result Value Ref Range   Prothrombin Time 13.8 11.4 - 15.2 seconds   INR 1.0 0.8 - 1.2    Comment: (NOTE) INR goal varies based on device and disease states. Performed at [facility], [address], [place], [place] [address]   APTT     Status: None   Collection Time: 11/25/22 12:11 AM  Result Value Ref Range   aPTT 28 24 - 36 seconds    Comment: Performed at [facility] [place] [facility], [address], [place], [place] [address]  CBC with Differential     Status: Abnormal   Collection Time: 11/25/22 12:11 AM  Result Value Ref Range   WBC 10.7 (  H) 4.0 - 10.5 K/uL   RBC 4.08 3.87 - 5.11 MIL/uL   Hemoglobin 9.3 (L) 12.0 - 15.0 g/dL   HCT 28.4 (L) 13.2 - 44.0 %   MCV 71.3 (L) 80.0 - 100.0 fL   MCH 22.8 (L) 26.0 - 34.0 pg   MCHC 32.0 30.0 - 36.0 g/dL   RDW 10.2 72.5 - 36.6 %   Platelets 148  (L) 150 - 400 K/uL    Comment: REPEATED TO VERIFY PLATELET COUNT CONFIRMED BY SMEAR    nRBC 0.0 0.0 - 0.2 %   Neutrophils Relative % 84 %   Neutro Abs 9.1 (H) 1.7 - 7.7 K/uL   Lymphocytes Relative 8 %   Lymphs Abs 0.9 0.7 - 4.0 K/uL   Monocytes Relative 7 %   Monocytes Absolute 0.7 0.1 - 1.0 K/uL   Eosinophils Relative 0 %   Eosinophils Absolute 0.0 0.0 - 0.5 K/uL   Basophils Relative 0 %   Basophils Absolute 0.0 0.0 - 0.1 K/uL   Immature Granulocytes 1 %   Abs Immature Granulocytes 0.05 0.00 - 0.07 K/uL    Comment: Performed at [facility], [address] [place], [place] [address]  Comprehensive metabolic panel     Status: Abnormal   Collection Time: 11/25/22 12:11 AM  Result Value Ref Range   Sodium 137 135 - 145 mmol/L   Potassium 3.8 3.5 - 5.1 mmol/L   Chloride 102 98 - 111 mmol/L   CO2 20 (L) 22 - 32 mmol/L   Glucose, Bld 93 70 - 99 mg/dL    Comment: Glucose reference range applies only to samples taken after fasting for at least 8 hours.   BUN 8 8 - 23 mg/dL   Creatinine, Ser 7.42 0.44 - 1.00 mg/dL   Calcium 9.0 8.9 - 59.5 mg/dL   Total Protein 6.5 6.5 - 8.1 g/dL   Albumin 2.6 (L) 3.5 - 5.0 g/dL   AST 24 15 - 41 U/L   ALT 20 0 - 44 U/L   Alkaline Phosphatase 72 38 - 126 U/L   Total Bilirubin 0.7 0.3 - 1.2 mg/dL   GFR, Estimated >63 >87 mL/min    Comment: (NOTE) Calculated using the CKD-EPI Creatinine Equation (2021)    Anion gap 15 5 - 15    Comment: Performed at [facility], [address], [place], [place] [address]  Procalcitonin     Status: None   Collection Time: 11/25/22 12:11 AM  Result Value Ref Range   Procalcitonin 0.53 ng/mL    Comment:        Interpretation: PCT > 0.5 ng/mL and <= 2 ng/mL: Systemic infection (sepsis) is possible, but other conditions are known to elevate PCT as well. (NOTE)       Sepsis PCT Algorithm           Lower Respiratory Tract                                      Infection PCT Algorithm     ----------------------------     ----------------------------         PCT < 0.25 ng/mL                PCT < 0.10 ng/mL          Strongly encourage             Strongly discourage  discontinuation of antibiotics    initiation of antibiotics    ----------------------------     -----------------------------       PCT 0.25 - 0.50 ng/mL            PCT 0.10 - 0.25 ng/mL               OR       >80% decrease in PCT            Discourage initiation of                                            antibiotics      Encourage discontinuation           of antibiotics    ----------------------------     -----------------------------         PCT >= 0.50 ng/mL              PCT 0.26 - 0.50 ng/mL                AND       <80% decrease in PCT             Encourage initiation of                                             antibiotics       Encourage continuation           of antibiotics    ----------------------------     -----------------------------        PCT >= 0.50 ng/mL                  PCT > 0.50 ng/mL               AND         increase in PCT                  Strongly encourage                                      initiation of antibiotics    Strongly encourage escalation           of antibiotics                                     -----------------------------                                           PCT <= 0.25 ng/mL                                                 OR                                        >  80% decrease in PCT                                      Discontinue / Do not initiate                                             antibiotics  Performed at [facility], [address], [place], [place] [address]   CK     Status: None   Collection Time: 11/25/22 12:11 AM  Result Value Ref Range   Total CK 96 38 - 234 U/L    Comment: Performed at [facility], [address], [place], [place] [address]  Magnesium     Status: None   Collection Time: 11/25/22 12:11 AM   Result Value Ref Range   Magnesium 1.7 1.7 - 2.4 mg/dL    Comment: Performed at [facility], [address], [place], [place] [address]  Phosphorus     Status: None   Collection Time: 11/25/22 12:11 AM  Result Value Ref Range   Phosphorus 2.9 2.5 - 4.6 mg/dL    Comment: Performed at [facility], [address] [place], [place] [address]  Vitamin B12     Status: None   Collection Time: 11/25/22 12:11 AM  Result Value Ref Range   Vitamin B-12 641 180 - 914 pg/mL    Comment: (NOTE) This assay is not validated for testing neonatal or myeloproliferative syndrome specimens for Vitamin B12 levels. Performed at [facility], [address], [place], [place] [address]   Iron and TIBC     Status: Abnormal   Collection Time: 11/25/22 12:11 AM  Result Value Ref Range   Iron 11 (L) 28 - 170 ug/dL   TIBC 237 (L) 628 - 315 ug/dL   Saturation Ratios 6 (L) 10.4 - 31.8 %   UIBC 178 ug/dL    Comment: Performed at [facility], [address] [place], [place] [address]  Ferritin     Status: Abnormal   Collection Time: 11/25/22 12:11 AM  Result Value Ref Range   Ferritin 1,348 (H) 11 - 307 ng/mL    Comment: Performed at [facility], [address], [place], [place] [address]  Reticulocytes     Status: None   Collection Time: 11/25/22 12:11 AM  Result Value Ref Range   Retic Ct Pct 0.8 0.4 - 3.1 %   RBC. 4.08 3.87 - 5.11 MIL/uL   Retic Count, Absolute 31.8 19.0 - 186.0 K/uL   Immature Retic Fract 14.2 2.3 - 15.9 %    Comment: Performed at [facility], [address], [place], [place] [address]  Prealbumin     Status: Abnormal   Collection Time: 11/25/22 12:11 AM  Result Value Ref Range   Prealbumin 6 (L) 18 - 38 mg/dL    Comment: Performed at [facility], [address] [place], [place] [address]  TSH     Status: None   Collection Time: 11/25/22 12:11 AM  Result Value Ref Range   TSH 0.649 0.350 - 4.500 uIU/mL    Comment: Performed by a  3rd Generation assay with a functional sensitivity of <=0.01 uIU/mL. Performed at [facility], [address] [place], [place] [address]   Hemoglobin A1c     Status: Abnormal   Collection Time: 11/25/22 12:11 AM  Result Value Ref Range   Hgb A1c MFr Bld 5.9 (H) 4.8 - 5.6 %    Comment: (NOTE) Pre diabetes:          5.7%-6.4%  Diabetes:              >6.4%  Glycemic control for   <7.0% adults with diabetes    Mean Plasma Glucose 122.63 mg/dL    Comment: Performed at [facility], [address] [place], [place] [address]  Folate     Status: None   Collection Time: 11/25/22 12:11 AM  Result Value Ref Range   Folate 26.6 >5.9 ng/mL    Comment: Performed at [facility], [address] [place], [place] [address]  I-Stat venous blood gas, ED     Status: Abnormal   Collection Time: 11/25/22 12:26 AM  Result Value Ref Range   pH, Ven 7.454 (H) 7.25 - 7.43   pCO2, Ven 32.2 (L) 44 - 60 mmHg   pO2, Ven 48 (H) 32 - 45 mmHg   Bicarbonate 22.6 20.0 - 28.0 mmol/L   TCO2 24 22 - 32 mmol/L   O2 Saturation 86 %   Acid-base deficit 1.0 0.0 - 2.0 mmol/L   Sodium 136 135 - 145 mmol/L   Potassium 3.7 3.5 - 5.1 mmol/L   Calcium, Ion 1.14 (L) 1.15 - 1.40 mmol/L   HCT 29.0 (L) 36.0 - 46.0 %   Hemoglobin 9.9 (L) 12.0 - 15.0 g/dL   Sample type VENOUS   CBG monitoring, ED     Status: None   Collection Time: 11/25/22 12:57 AM  Result Value Ref Range   Glucose-Capillary 87 70 - 99 mg/dL    Comment: Glucose reference range applies only to samples taken after fasting for at least 8 hours.  CBG monitoring, ED     Status: Abnormal   Collection Time: 11/25/22  3:30 AM  Result Value Ref Range   Glucose-Capillary 119 (H) 70 - 99 mg/dL    Comment: Glucose reference range applies only to samples taken after fasting for at least 8 hours.  CBG monitoring, ED     Status: Abnormal   Collection Time: 11/25/22  7:55 AM  Result Value Ref Range   Glucose-Capillary 106 (H) 70 - 99 mg/dL     Comment: Glucose reference range applies only to samples taken after fasting for at least 8 hours.   Comment 1 Notify RN    Comment 2 Document in Chart    DG Chest 2 View  Result Date: 11/24/2022 CLINICAL DATA:  10031 Cough 10031 EXAM: CHEST - 2 VIEW COMPARISON:  Chest radiograph 10/03/2022 FINDINGS: No pleural effusion. No pneumothorax. Normal cardiac and mediastinal contours. There are hazy bibasilar airspace opacities, left-greater-than-right) which could represent atelectasis or infection. Visualized upper abdomen is unremarkable. No radiographically apparent displaced rib fractures. Vertebral body heights are maintained. IMPRESSION: Hazy bibasilar airspace opacities, left-greater-than-right, could represent atelectasis, but are suspicious for infection. Electronically Signed   By: Lorenza Cambridge M.D.   On: 11/24/2022 13:07    Pending Labs Unresulted Labs (From admission, onward)     Start     Ordered   11/26/22 0500  CBC  Tomorrow morning,   R        11/25/22 0833   11/26/22 0500  Basic metabolic panel  Tomorrow morning,  R        11/25/22 0833   11/25/22 0056  Respiratory (~20 pathogens) panel by PCR  (COPD / Pneumonia / Cellulitis / Lower Extremity Wound)  Once,   R        11/25/22 0056   11/25/22 0056  MRSA Next Gen by PCR, Nasal  (COPD / Pneumonia / Cellulitis / Lower Extremity Wound)  Once,   R        11/25/22 0056   11/24/22 2212  Blood gas, venous  ONCE - STAT,   STAT       Question:  Release to patient  Answer:  Immediate   11/24/22 2211   11/24/22 2210  Expectorated Sputum Assessment w Gram Stain, Rflx to Resp Cult  Once,   R        11/24/22 2211   11/24/22 2210  Legionella Pneumophila Serogp 1 Ur Ag  Once,   URGENT        11/24/22 2211   11/24/22 2210  Strep pneumoniae urinary antigen  Once,   URGENT        11/24/22 2211   Unscheduled  Occult blood card to lab, stool RN will collect  As needed,   R     Question Answer Comment  Specimen to be collected by: RN will collect    Release to patient Immediate      11/25/22 0010   Signed and Held  Magnesium  Tomorrow morning,   R        Signed and Held   Signed and Held  Phosphorus  Tomorrow morning,   R        Signed and Held   Signed and Held  Comprehensive metabolic panel  Tomorrow morning,   R       Question:  Release to patient  Answer:  Immediate   Signed and Held   Signed and Held  CBC  Tomorrow morning,   R       Question:  Release to patient  Answer:  Immediate   Signed and Held            Vitals/Pain Today's Vitals   11/25/22 0630 11/25/22 0751 11/25/22 0829 11/25/22 1142  BP: 124/75   123/73  Pulse: 89   97  Resp: (!) 34   (!) 22  Temp:   98 F (36.7 C) 98 F (36.7 C)  TempSrc:    Oral  SpO2: 96% 98%  98%  Weight:      Height:      PainSc:        Isolation Precautions Droplet precaution  Medications Medications  cefTRIAXone (ROCEPHIN) 2 g in sodium chloride 0.9 % 100 mL IVPB (has no administration in time range)  azithromycin (ZITHROMAX) 500 mg in sodium chloride 0.9 % 250 mL IVPB (has no administration in time range)  albuterol (PROVENTIL) (2.5 MG/3ML) 0.083% nebulizer solution 2.5 mg (has no administration in time range)  atorvastatin (LIPITOR) tablet 10 mg (10 mg Oral Given 11/25/22 0928)  famotidine (PEPCID) tablet 40 mg (40 mg Oral Given 11/25/22 0928)  gabapentin (NEURONTIN) capsule 300 mg (300 mg Oral Given 11/25/22 0125)  metoprolol succinate (TOPROL-XL) 24 hr tablet 25 mg (25 mg Oral Given 11/25/22 0929)  montelukast (SINGULAIR) tablet 10 mg (10 mg Oral Given 11/25/22 0124)  0.9 %  sodium chloride infusion ( Intravenous New Bag/Given 11/25/22 0146)  acetaminophen (TYLENOL) tablet 650 mg (has no administration in time range)    Or  acetaminophen (TYLENOL) suppository  650 mg (has no administration in time range)  HYDROcodone-acetaminophen (NORCO/VICODIN) 5-325 MG per tablet 1-2 tablet (has no administration in time range)  ondansetron (ZOFRAN) tablet 4 mg (has no  administration in time range)    Or  ondansetron (ZOFRAN) injection 4 mg (has no administration in time range)  methylPREDNISolone sodium succinate (SOLU-MEDROL) 40 mg/mL injection 40 mg (40 mg Intravenous Given 11/25/22 0125)    Followed by  predniSONE (DELTASONE) tablet 40 mg (has no administration in time range)  ipratropium-albuterol (DUONEB) 0.5-2.5 (3) MG/3ML nebulizer solution 3 mL (3 mLs Nebulization Given 11/25/22 0751)  guaiFENesin (MUCINEX) 12 hr tablet 600 mg (600 mg Oral Given 11/25/22 0929)  pantoprazole (PROTONIX) EC tablet 40 mg (has no administration in time range)  insulin aspart (novoLOG) injection 0-9 Units ( Subcutaneous Not Given 11/25/22 0757)  ferric gluconate (FERRLECIT) 250 mg in sodium chloride 0.9 % 250 mL IVPB (has no administration in time range)  chlorpheniramine-HYDROcodone (TUSSIONEX) 10-8 MG/5ML suspension 5 mL (5 mLs Oral Given 11/25/22 1030)  acetaminophen (TYLENOL) tablet 650 mg (650 mg Oral Given 11/24/22 1219)  lactated ringers bolus 1,000 mL (0 mLs Intravenous Stopped 11/24/22 1737)  cefTRIAXone (ROCEPHIN) 1 g in sodium chloride 0.9 % 100 mL IVPB (0 g Intravenous Stopped 11/24/22 1708)  azithromycin (ZITHROMAX) 500 mg in sodium chloride 0.9 % 250 mL IVPB (0 mg Intravenous Stopped 11/24/22 1900)  lactated ringers bolus 1,000 mL ( Intravenous Stopped 11/24/22 2133)  acetaminophen (TYLENOL) tablet 1,000 mg (1,000 mg Oral Given 11/24/22 2105)    Mobility walks     Focused Assessments Pulmonary Assessment Handoff:  Lung sounds: Bilateral Breath Sounds: Diminished L Breath Sounds: Diminished R Breath Sounds: Diminished O2 Device: Room Air      R Recommendations: See Admitting Provider Note  Report given to:   Additional Notes:

## 2022-11-25 NOTE — Assessment & Plan Note (Signed)
Obtain anemia panel and follow Hemoccult stool

## 2022-11-25 NOTE — Assessment & Plan Note (Signed)
Resume home betablocker for now given it is a chronic medication

## 2022-11-25 NOTE — Progress Notes (Signed)
PROGRESS NOTE  Jamie Cox  ZOX:[id number] DOB: 06/08/1960 DOA: 11/24/2022 PCP: [name], MD   Brief Narrative: Patient is a 47 female with history of rheumatoid arthritis, asthma, GERD, chronic anemia who presented with shortness of breath, cough which has been ongoing for 3 weeks.  Chest x-ray on presentation showed hazy bibasilar airspace opacities left greater than right suspicion for pneumonia.  Patient was admitted for the management of community-acquired pneumonia.  Started on antibiotics.  Assessment & Plan:  Principal Problem:   CAP (community acquired pneumonia) Active Problems:   Anemia   Rheumatoid lung disease (HCC)   Tachycardia   GERD (gastroesophageal reflux disease)   Asthma, chronic obstructive, with acute exacerbation (HCC)   Controlled type 2 diabetes mellitus without complication, without long-term current use of insulin (HCC)   Sepsis (HCC)   Sepsis secondary to community-acquired pneumonia: Presented with fever, cough.  Chest x-ray showed infiltrate on bilateral lower lobes more on the left.  Coughing ,bringing up sputum.  Started on azithromycin, ceftriaxone.  COVID/flu/influenza negative.  She was febrile last night,afebrile this mrng.  Will follow-up cultures  Normocytic anemia: Currently hemoglobin stable in the range of 9.  Iron panel showed severe iron deficiency.  Denies any hematochezia or melena.  Will give her IV iron.  Needs oral iron supplementation on discharge.  Rheumatoid lung disease: Follows with pulmonology.  On steroids at home.  Follows with rheumatology for her rheumatoid arthritis  GERD: Continue Protonix  Acute asthma exacerbation: Likely triggered by pneumonia.  Continue bronchodilators, currently on room air.  Continue steroid at current dose, no wheezing this morning.  On room air  Type 2 diabetes: Currently on sliding scale.  A1c of 5.9  History of hyperlipidemia: On Lipitor         DVT prophylaxis:SCDs Start:  11/25/22 0056     Code Status: Full Code  Family Communication: None at bedside  Patient status:Inpatient  Patient is from :Home  Anticipated discharge WJ:XBJY  Estimated DC date:1-2 days   Consultants: None  Procedures:None  Antimicrobials:  Anti-infectives (From admission, onward)    Start     Dose/Rate Route Frequency Ordered Stop   11/25/22 2100  cefTRIAXone (ROCEPHIN) 2 g in sodium chloride 0.9 % 100 mL IVPB        2 g 200 mL/hr over 30 Minutes Intravenous Every 24 hours 11/24/22 2211 11/30/22 2059   11/25/22 2000  azithromycin (ZITHROMAX) 500 mg in sodium chloride 0.9 % 250 mL IVPB        500 mg 250 mL/hr over 60 Minutes Intravenous Every 24 hours 11/24/22 2211 11/30/22 1959   11/24/22 1545  cefTRIAXone (ROCEPHIN) 1 g in sodium chloride 0.9 % 100 mL IVPB        1 g 200 mL/hr over 30 Minutes Intravenous  Once 11/24/22 1537 11/24/22 1708   11/24/22 1545  azithromycin (ZITHROMAX) 500 mg in sodium chloride 0.9 % 250 mL IVPB        500 mg 250 mL/hr over 60 Minutes Intravenous  Once 11/24/22 1537 11/24/22 1900       Subjective: Patient seen and examined at bedside today.  Hemodynamically stable.  On room air.  Does not appear short of breath but coughing violently.  Afebrile this morning  Objective: Vitals:   11/25/22 0150 11/25/22 0447 11/25/22 0629 11/25/22 0751  BP: 125/70 127/79 124/75   Pulse: 99 96 89   Resp: 19 20 20    Temp:  98.4 F (36.9 C)  TempSrc:  Oral    SpO2: 97% 99% 97% 98%  Weight:      Height:        Intake/Output Summary (Last 24 hours) at 11/25/2022 0827 Last data filed at 11/24/2022 2336 Gross per 24 hour  Intake 1000.21 ml  Output --  Net 1000.21 ml   Filed Weights   11/24/22 1207  Weight: 49 kg    Examination:  General exam: Overall comfortable, not in distress HEENT: PERRL Respiratory system:  no wheezes or crackles  Cardiovascular system: S1 & S2 heard, RRR.  Gastrointestinal system: Abdomen is nondistended, soft and  nontender. Central nervous system: Alert and oriented Extremities: No edema, no clubbing ,no cyanosis Skin: No rashes, no ulcers,no icterus     Data Reviewed: I have personally reviewed following labs and imaging studies  CBC: Recent Labs  Lab 11/24/22 1224 11/25/22 0011 11/25/22 0026  WBC 10.9* 10.7*  --   NEUTROABS 9.8* 9.1*  --   HGB 9.4* 9.3* 9.9*  HCT 29.7* 29.1* 29.0*  MCV 71.7* 71.3*  --   PLT 177 148*  --    Basic Metabolic Panel: Recent Labs  Lab 11/24/22 1224 11/25/22 0011 11/25/22 0026  NA 136 137 136  K 3.8 3.8 3.7  CL 102 102  --   CO2 22 20*  --   GLUCOSE 87 93  --   BUN 12 8  --   CREATININE 0.88 0.75  --   CALCIUM 9.2 9.0  --   MG  --  1.7  --   PHOS  --  2.9  --      Recent Results (from the past 240 hour(s))  Resp panel by RT-PCR (RSV, Flu A&B, Covid) Anterior Nasal Swab     Status: None   Collection Time: 11/24/22 12:11 PM   Specimen: Anterior Nasal Swab  Result Value Ref Range Status   SARS Coronavirus 2 by RT PCR NEGATIVE NEGATIVE Final   Influenza A by PCR NEGATIVE NEGATIVE Final   Influenza B by PCR NEGATIVE NEGATIVE Final    Comment: (NOTE) The Xpert Xpress SARS-CoV-2/FLU/RSV plus assay is intended as an aid in the diagnosis of influenza from Nasopharyngeal swab specimens and should not be used as a sole basis for treatment. Nasal washings and aspirates are unacceptable for Xpert Xpress SARS-CoV-2/FLU/RSV testing.  Fact Sheet for Patients: BloggerCourse.com  Fact Sheet for Healthcare Providers: SeriousBroker.it  This test is not yet approved or cleared by the [place] FDA and has been authorized for detection and/or diagnosis of SARS-CoV-2 by FDA under an Emergency Use Authorization (EUA). This EUA will remain in effect (meaning this test can be used) for the duration of the COVID-19 declaration under Section 564(b)(1) of the Act, 21 U.S.C. section 360bbb-3(b)(1), unless the  authorization is terminated or revoked.     Resp Syncytial Virus by PCR NEGATIVE NEGATIVE Final    Comment: (NOTE) Fact Sheet for Patients: BloggerCourse.com  Fact Sheet for Healthcare Providers: SeriousBroker.it  This test is not yet approved or cleared by the [place] FDA and has been authorized for detection and/or diagnosis of SARS-CoV-2 by FDA under an Emergency Use Authorization (EUA). This EUA will remain in effect (meaning this test can be used) for the duration of the COVID-19 declaration under Section 564(b)(1) of the Act, 21 U.S.C. section 360bbb-3(b)(1), unless the authorization is terminated or revoked.  Performed at [facility], [address] [place], [place] [address]   Blood culture (routine x 2)  Status: None (Preliminary result)   Collection Time: 11/24/22  4:25 PM   Specimen: BLOOD  Result Value Ref Range Status   Specimen Description BLOOD SITE NOT SPECIFIED  Final   Special Requests   Final    BOTTLES DRAWN AEROBIC AND ANAEROBIC Blood Culture adequate volume   Culture   Final    NO GROWTH < 24 HOURS Performed at [facility], [address], [place], [place] [address]    Report Status PENDING  Incomplete  Blood culture (routine x 2)     Status: None (Preliminary result)   Collection Time: 11/24/22  4:35 PM   Specimen: BLOOD LEFT ARM  Result Value Ref Range Status   Specimen Description BLOOD LEFT ARM  Final   Special Requests   Final    BOTTLES DRAWN AEROBIC AND ANAEROBIC Blood Culture adequate volume   Culture   Final    NO GROWTH < 24 HOURS Performed at [facility], [address] [place], [place] [address]    Report Status PENDING  Incomplete     Radiology Studies: DG Chest 2 View  Result Date: 11/24/2022 CLINICAL DATA:  10031 Cough 10031 EXAM: CHEST - 2 VIEW COMPARISON:  Chest radiograph 10/03/2022 FINDINGS: No pleural effusion. No pneumothorax. Normal cardiac  and mediastinal contours. There are hazy bibasilar airspace opacities, left-greater-than-right) which could represent atelectasis or infection. Visualized upper abdomen is unremarkable. No radiographically apparent displaced rib fractures. Vertebral body heights are maintained. IMPRESSION: Hazy bibasilar airspace opacities, left-greater-than-right, could represent atelectasis, but are suspicious for infection. Electronically Signed   By: Lorenza Cambridge M.D.   On: 11/24/2022 13:07    Scheduled Meds:  atorvastatin  10 mg Oral Daily   famotidine  40 mg Oral Daily   gabapentin  300 mg Oral QHS   guaiFENesin  600 mg Oral BID   insulin aspart  0-9 Units Subcutaneous Q4H   ipratropium-albuterol  3 mL Nebulization Q6H   methylPREDNISolone (SOLU-MEDROL) injection  40 mg Intravenous Q12H   Followed by   Melene Muller ON 11/26/2022] predniSONE  40 mg Oral Q breakfast   metoprolol succinate  25 mg Oral Daily   montelukast  10 mg Oral QHS   pantoprazole  40 mg Oral Q1200   Continuous Infusions:  sodium chloride 75 mL/hr at 11/25/22 0146   azithromycin     cefTRIAXone (ROCEPHIN)  IV       LOS: 0 days   Burnadette Pop, MD [facility] P6/21/2024, 8:27 AM

## 2022-11-25 NOTE — Assessment & Plan Note (Signed)
-  SIRS criteria met with  elevated white blood cell count,       Component Value Date/Time   WBC 10.9 (H) 11/24/2022 1224   LYMPHSABS 0.5 (L) 11/24/2022 1224   LYMPHSABS 1.2 08/18/2020 1415     tachycardia   ,   fever   RR >20 Today's Vitals   11/24/22 1440 11/24/22 1837 11/24/22 2036 11/24/22 2348  BP: 113/64 (!) 151/123 (!) 146/96 134/80  Pulse: (!) 114 (!) 125 (!) 119 (!) 106  Resp: 16 17 20 20   Temp: 98.9 F (37.2 C) 98.5 F (36.9 C) (!) 101.5 F (38.6 C) 98.6 F (37 C)  TempSrc: Oral Oral Oral Oral  SpO2: 97% 97%  97%  Weight:      Height:      PainSc:       Body mass index is 20.41 kg/m.     The recent clinical data is shown below. Vitals:   11/24/22 1440 11/24/22 1837 11/24/22 2036 11/24/22 2348  BP: 113/64 (!) 151/123 (!) 146/96 134/80  Pulse: (!) 114 (!) 125 (!) 119 (!) 106  Resp: 16 17 20 20   Temp: 98.9 F (37.2 C) 98.5 F (36.9 C) (!) 101.5 F (38.6 C) 98.6 F (37 C)  TempSrc: Oral Oral Oral Oral  SpO2: 97% 97%  97%  Weight:      Height:          -Most likely source being:  pulmonary       - Obtain serial lactic acid and procalcitonin level.  - Initiated IV antibiotics in ER: Antibiotics Given (last 72 hours)     Date/Time Action Medication Dose Rate   11/24/22 1638 New Bag/Given   cefTRIAXone (ROCEPHIN) 1 g in sodium chloride 0.9 % 100 mL IVPB 1 g 200 mL/hr   11/24/22 1808 New Bag/Given   azithromycin (ZITHROMAX) 500 mg in sodium chloride 0.9 % 250 mL IVPB 500 mg 250 mL/hr       Will continue  on : rocephin azithro   - await results of blood and urine culture  - Rehydrate aggressively  Intravenous fluids were administered,   12:18 AM

## 2022-11-25 NOTE — Assessment & Plan Note (Signed)
-  -   Will initiate: Steroid taper  -  Antibiotics rocephin azithromycin - Albuterol   - scheduled duoneb,    -  Mucinex.  Titrate O2 to saturation >90%. Follow patients respiratory status.  Order nfluenza PCR   VBG ordered

## 2022-11-26 ENCOUNTER — Other Ambulatory Visit (HOSPITAL_COMMUNITY): Payer: Self-pay

## 2022-11-26 DIAGNOSIS — J441 Chronic obstructive pulmonary disease with (acute) exacerbation: Secondary | ICD-10-CM | POA: Diagnosis not present

## 2022-11-26 DIAGNOSIS — J189 Pneumonia, unspecified organism: Secondary | ICD-10-CM | POA: Diagnosis not present

## 2022-11-26 DIAGNOSIS — E119 Type 2 diabetes mellitus without complications: Secondary | ICD-10-CM | POA: Diagnosis not present

## 2022-11-26 DIAGNOSIS — K219 Gastro-esophageal reflux disease without esophagitis: Secondary | ICD-10-CM | POA: Diagnosis not present

## 2022-11-26 LAB — CBC
HCT: 25.7 % — ABNORMAL LOW (ref 36.0–46.0)
Hemoglobin: 8.2 g/dL — ABNORMAL LOW (ref 12.0–15.0)
MCH: 22.5 pg — ABNORMAL LOW (ref 26.0–34.0)
MCHC: 31.9 g/dL (ref 30.0–36.0)
MCV: 70.6 fL — ABNORMAL LOW (ref 80.0–100.0)
Platelets: 196 10*3/uL (ref 150–400)
RBC: 3.64 MIL/uL — ABNORMAL LOW (ref 3.87–5.11)
RDW: 14.5 % (ref 11.5–15.5)
WBC: 9.9 10*3/uL (ref 4.0–10.5)
nRBC: 0 % (ref 0.0–0.2)

## 2022-11-26 LAB — BASIC METABOLIC PANEL
Anion gap: 8 (ref 5–15)
BUN: 14 mg/dL (ref 8–23)
CO2: 22 mmol/L (ref 22–32)
Calcium: 8.7 mg/dL — ABNORMAL LOW (ref 8.9–10.3)
Chloride: 107 mmol/L (ref 98–111)
Creatinine, Ser: 0.74 mg/dL (ref 0.44–1.00)
GFR, Estimated: >60 mL/min (ref >60)
Glucose, Bld: 138 mg/dL — ABNORMAL HIGH (ref 70–99)
Potassium: 4.3 mmol/L (ref 3.5–5.1)
Sodium: 137 mmol/L (ref 135–145)

## 2022-11-26 LAB — GLUCOSE, CAPILLARY
Glucose-Capillary: 123 mg/dL — ABNORMAL HIGH (ref 70–99)
Glucose-Capillary: 95 mg/dL (ref 70–99)
Glucose-Capillary: 87 mg/dL (ref 70–99)

## 2022-11-26 MED ORDER — IPRATROPIUM-ALBUTEROL 0.5-2.5 (3) MG/3ML IN SOLN: 3.0000 mL | Freq: Four times a day (QID) | RESPIRATORY_TRACT | Status: DC | PRN

## 2022-11-26 MED ORDER — CEFPODOXIME PROXETIL 200 MG PO TABS
100.0000 mg | ORAL_TABLET | Freq: Two times a day (BID) | ORAL | 0 refills | Status: DC
  Filled 2022-11-26: qty 8, 4d supply, fill #0
  Filled 2022-11-26: qty 4, 4d supply, fill #0

## 2022-11-26 MED ORDER — BENZONATATE 100 MG PO CAPS
100.0000 mg | ORAL_CAPSULE | Freq: Three times a day (TID) | ORAL | 0 refills | Status: DC | PRN
  Filled 2022-11-26: qty 20, 7d supply, fill #0

## 2022-11-26 MED ORDER — SODIUM CHLORIDE 0.9 % IV SOLN
2.0000 g | INTRAVENOUS | Status: DC
  Administered 2022-11-26: 2 g via INTRAVENOUS
  Filled 2022-11-26: qty 20

## 2022-11-26 MED ORDER — FERROUS SULFATE 325 (65 FE) MG PO TABS
325.0000 mg | ORAL_TABLET | Freq: Two times a day (BID) | ORAL | 3 refills | Status: DC
  Filled 2022-11-26: qty 60, 30d supply, fill #0

## 2022-11-26 MED ORDER — BUDESONIDE-FORMOTEROL FUMARATE 160-4.5 MCG/ACT IN AERO
2.0000 | INHALATION_SPRAY | Freq: Two times a day (BID) | RESPIRATORY_TRACT | 12 refills | Status: DC
  Filled 2022-11-26: qty 10.2, 30d supply, fill #0

## 2022-11-26 MED ORDER — AZITHROMYCIN 500 MG PO TABS
500.0000 mg | ORAL_TABLET | Freq: Every day | ORAL | Status: DC
  Administered 2022-11-26: 500 mg via ORAL
  Filled 2022-11-26: qty 1

## 2022-11-26 MED ORDER — PREDNISONE 10 MG PO TABS
ORAL_TABLET | ORAL | 0 refills | Status: DC
  Filled 2022-11-26: qty 19, 7d supply, fill #0

## 2022-11-26 NOTE — Plan of Care (Signed)
Discharge instructions discussed with patient.  Patient instructed on home medications, restrictions, and follow up appointments. Belongings gathered and sent with patient.  Patients medications waiting on TOC meds at this time

## 2022-11-26 NOTE — Discharge Summary (Addendum)
PATIENT DETAILS Name: Jamie Cox Age: 62 y.o. Sex: female Date of Birth: 1960/09/15 MRN: [id number]. Admitting Physician: Therisa Doyne, MD [id number], Bryon Lions, MD  Admit Date: 11/24/2022 Discharge date: 11/26/2022  Recommendations for Outpatient Follow-up:  Follow up with PCP in 1-2 weeks Please obtain CMP/CBC in one week  Admitted From:  Home  Disposition: Home   Discharge Condition: good  CODE STATUS:   Code Status: Full Code   Diet recommendation:  Diet Order             Diet - low sodium heart healthy           Diet Carb Modified Fluid consistency: Thin; Room service appropriate? Yes  Diet effective now                    Brief Summary: 62 year old with history of rheumatoid arthritis with lung involvement on chronic prednisone, asthma-who presented with several days history of cough/shortness of breath-thought to have pneumonia with bronchial asthma exacerbation.  Admitted to hospitalist service.  Brief Hospital Course: Sepsis secondary to PNA Immunocompromise is on chronic steroids Rapidly improved with Rocephin/Zithromax COVID PCR/influenza PCR/RSV PCR and all cultures negative so far. Will transition to oral antibiotics on discharge.  Bronchial asthma exacerbation Significantly better-no wheezing Continue bronchodilators and tapering prednisone on discharge.  Rheumatoid arthritis with lung involvement Resume usual dosing of steroids after taper complete.  HLD Lipitor  GERD Pepcid  Normocytic anemia Likely due to chronic disease Follow with PCP  Obesity: Estimated body mass index is 20.37 kg/m as calculated from the following:   Height as of this encounter: 5\' 1"  (1.549 m).   Weight as of this encounter: 48.9 kg.   Discharge Diagnoses:  Principal Problem:   CAP (community acquired pneumonia) Active Problems:   Anemia   Rheumatoid lung disease (HCC)   Tachycardia   GERD (gastroesophageal reflux disease)   Asthma,  chronic obstructive, with acute exacerbation (HCC)   Controlled type 2 diabetes mellitus without complication, without long-term current use of insulin (HCC)   Sepsis (HCC)   Discharge Instructions:  Activity:  As tolerated  Discharge Instructions     Call MD for:  difficulty breathing, headache or visual disturbances   Complete by: As directed    Call MD for:  extreme fatigue   Complete by: As directed    Call MD for:  persistant dizziness or light-headedness   Complete by: As directed    Diet - low sodium heart healthy   Complete by: As directed    Discharge instructions   Complete by: As directed    Follow with Primary MD  [name], MD in 1-2 weeks  Please get a complete blood count and chemistry panel checked by your Primary MD at your next visit, and again as instructed by your Primary MD.  Get Medicines reviewed and adjusted: Please take all your medications with you for your next visit with your Primary MD  Laboratory/radiological data: Please request your Primary MD to go over all hospital tests and procedure/radiological results at the follow up, please ask your Primary MD to get all Hospital records sent to his/her office.  In some cases, they will be blood work, cultures and biopsy results pending at the time of your discharge. Please request that your primary care M.D. follows up on these results.  Also Note the following: If you experience worsening of your admission symptoms, develop shortness of breath, life threatening emergency, suicidal or homicidal thoughts you  must seek medical attention immediately by calling 911 or calling your MD immediately  if symptoms less severe.  You must read complete instructions/literature along with all the possible adverse reactions/side effects for all the Medicines you take and that have been prescribed to you. Take any new Medicines after you have completely understood and accpet all the possible adverse reactions/side  effects.   Do not drive when taking Pain medications or sleeping medications (Benzodaizepines)  Do not take more than prescribed Pain, Sleep and Anxiety Medications. It is not advisable to combine anxiety,sleep and pain medications without talking with your primary care practitioner  Special Instructions: If you have smoked or chewed Tobacco  in the last 2 yrs please stop smoking, stop any regular Alcohol  and or any Recreational drug use.  Wear Seat belts while driving.  Please note: You were cared for by a hospitalist during your hospital stay. Once you are discharged, your primary care physician will handle any further medical issues. Please note that NO REFILLS for any discharge medications will be authorized once you are discharged, as it is imperative that you return to your primary care physician (or establish a relationship with a primary care physician if you do not have one) for your post hospital discharge needs so that they can reassess your need for medications and monitor your lab values.   Increase activity slowly   Complete by: As directed       Allergies as of 11/26/2022       Reactions   Avelox [moxifloxacin Hcl In Nacl] Diarrhea, Nausea And Vomiting, Other (See Comments)   Dizziness, Cold chills    Bactrim [sulfamethoxazole-trimethoprim] Hives   Doxycycline    Abdominal discomfort, anorexia   Sulfamethoxazole-trimethoprim Hives   Augmentin [amoxicillin-pot Clavulanate] Hives, Itching, Swelling        Medication List     TAKE these medications    albuterol 108 (90 Base) MCG/ACT inhaler Commonly known as: VENTOLIN HFA INHALE ONE TO TWO PUFFS INTO THE LUNGS EVERY 6 HOURS AS NEEDED FOR WHEEZING OR SHORTNESS OF BREATH What changed: See the new instructions.   atorvastatin 10 MG tablet Commonly known as: LIPITOR Take 1 tablet (10 mg total) by mouth daily. NEEDS APPT/LABS   benzonatate 100 MG capsule Commonly known as: TESSALON Take 1 capsule (100 mg total)  by mouth 3 (three) times daily as needed.   budesonide-formoterol 160-4.5 MCG/ACT inhaler Commonly known as: Symbicort Inhale 2 puffs into the lungs 2 (two) times daily.   calcium-vitamin D 500-5 MG-MCG tablet Commonly known as: OSCAL WITH D Take 1 tablet by mouth daily with breakfast.   cefpodoxime 100 MG tablet Commonly known as: VANTIN Take 1 tablet (100 mg total) by mouth 2 (two) times daily. Start taking on: November 27, 2022   diphenhydrAMINE 25 MG tablet Commonly known as: BENADRYL Take 25 mg by mouth every 6 (six) hours as needed for allergies.   famotidine 40 MG tablet Commonly known as: PEPCID TAKE 1 TABLET BY MOUTH EVERY DAY   ferrous sulfate 325 (65 FE) MG EC tablet Take 1 tablet (325 mg total) by mouth 2 (two) times daily.   fluticasone 50 MCG/ACT nasal spray Commonly known as: FLONASE Place 2 sprays into both nostrils daily.   gabapentin 300 MG capsule Commonly known as: NEURONTIN TAKE 1 CAPSULE BY MOUTH THREE TIMES DAILY What changed: when to take this   Hematinic/Folic Acid 324-1 MG Tabs Generic drug: Ferrous Fumarate-Folic Acid TAKE 1 TABLET BY MOUTH EVERY DAY  HYDROcodone-acetaminophen 5-325 MG tablet Commonly known as: NORCO/VICODIN Take 1 tablet by mouth every 6 (six) hours as needed for moderate pain or severe pain.   metoprolol succinate 25 MG 24 hr tablet Commonly known as: TOPROL-XL Take 1 tablet (25 mg total) by mouth daily.   montelukast 10 MG tablet Commonly known as: SINGULAIR Take 1 tablet (10 mg total) by mouth at bedtime.   Mucinex D Max Strength [phone] MG Tb12 Generic drug: Pseudoephedrine-Guaifenesin Take 1 tablet by mouth 2 (two) times daily.   predniSONE 2.5 MG tablet Commonly known as: DELTASONE Take 1 tablet by mouth once daily with breakfast What changed: Another medication with the same name was added. Make sure you understand how and when to take each.   predniSONE 10 MG tablet Commonly known as: DELTASONE Take 40 mg  daily for 2 days, 30 mg daily for 2 days, 20 mg daily for 2 days,10 mg daily for 1 days, then resume your usual outpatient regimen of 2.5 mg daily. What changed: You were already taking a medication with the same name, and this prescription was added. Make sure you understand how and when to take each.        Follow-up Information     [name], MD.   Specialty: Family Medicine Contact information: [address] [place] [address] [phone]                Allergies  Allergen Reactions   Avelox [Moxifloxacin Hcl In Nacl] Diarrhea, Nausea And Vomiting and Other (See Comments)    Dizziness, Cold chills    Bactrim [Sulfamethoxazole-Trimethoprim] Hives   Doxycycline     Abdominal discomfort, anorexia   Sulfamethoxazole-Trimethoprim Hives   Augmentin [Amoxicillin-Pot Clavulanate] Hives, Itching and Swelling     Other Procedures/Studies: DG Chest 2 View  Result Date: 11/24/2022 CLINICAL DATA:  10031 Cough 10031 EXAM: CHEST - 2 VIEW COMPARISON:  Chest radiograph 10/03/2022 FINDINGS: No pleural effusion. No pneumothorax. Normal cardiac and mediastinal contours. There are hazy bibasilar airspace opacities, left-greater-than-right) which could represent atelectasis or infection. Visualized upper abdomen is unremarkable. No radiographically apparent displaced rib fractures. Vertebral body heights are maintained. IMPRESSION: Hazy bibasilar airspace opacities, left-greater-than-right, could represent atelectasis, but are suspicious for infection. Electronically Signed   By: Lorenza Cambridge M.D.   On: 11/24/2022 13:07     TODAY-DAY OF DISCHARGE:  Subjective:   Ariya Bohannon today has no headache,no chest abdominal pain,no new weakness tingling or numbness, feels much better wants to go home today.  Objective:   Blood pressure 125/76, pulse 92, temperature 97.6 F (36.4 C), temperature source Oral, resp. rate (!) 24, height 5\' 1"  (1.549 m), weight 48.9 kg,  SpO2 97 %.  Intake/Output Summary (Last 24 hours) at 11/26/2022 0939 Last data filed at 11/26/2022 0300 Gross per 24 hour  Intake 967.31 ml  Output --  Net 967.31 ml   Filed Weights   11/24/22 1207 11/25/22 1407  Weight: 49 kg 48.9 kg    Exam: Awake Alert, Oriented *3, No new F.N deficits, Normal affect [place].AT,PERRAL Supple Neck,No JVD, No cervical lymphadenopathy appriciated.  Symmetrical Chest wall movement, Good air movement bilaterally, CTAB RRR,No Gallops,Rubs or new Murmurs, No Parasternal Heave +ve B.Sounds, Abd Soft, Non tender, No organomegaly appriciated, No rebound -guarding or rigidity. No Cyanosis, Clubbing or edema, No new Rash or bruise   PERTINENT RADIOLOGIC STUDIES: DG Chest 2 View  Result Date: 11/24/2022 CLINICAL DATA:  10031 Cough 10031 EXAM: CHEST - 2  VIEW COMPARISON:  Chest radiograph 10/03/2022 FINDINGS: No pleural effusion. No pneumothorax. Normal cardiac and mediastinal contours. There are hazy bibasilar airspace opacities, left-greater-than-right) which could represent atelectasis or infection. Visualized upper abdomen is unremarkable. No radiographically apparent displaced rib fractures. Vertebral body heights are maintained. IMPRESSION: Hazy bibasilar airspace opacities, left-greater-than-right, could represent atelectasis, but are suspicious for infection. Electronically Signed   By: Lorenza Cambridge M.D.   On: 11/24/2022 13:07     PERTINENT LAB RESULTS: CBC: Recent Labs    11/25/22 0011 11/25/22 0026 11/26/22 0352  WBC 10.7*  --  9.9  HGB 9.3* 9.9* 8.2*  HCT 29.1* 29.0* 25.7*  PLT 148*  --  196   CMET CMP     Component Value Date/Time   NA 137 11/26/2022 0352   K 4.3 11/26/2022 0352   CL 107 11/26/2022 0352   CO2 22 11/26/2022 0352   GLUCOSE 138 (H) 11/26/2022 0352   BUN 14 11/26/2022 0352   CREATININE 0.74 11/26/2022 0352   CREATININE 0.71 03/31/2020 1108   CALCIUM 8.7 (L) 11/26/2022 0352   PROT 6.5 11/25/2022 0011   ALBUMIN 2.6 (L)  11/25/2022 0011   AST 24 11/25/2022 0011   ALT 20 11/25/2022 0011   ALKPHOS 72 11/25/2022 0011   BILITOT 0.7 11/25/2022 0011   GFR 77.36 11/30/2021 1429   GFRNONAA >60 11/26/2022 0352   GFRNONAA >89 07/22/2013 1155    GFR Estimated Creatinine Clearance: 53.6 mL/min (by C-G formula based on SCr of 0.74 mg/dL). No results for input(s): "LIPASE", "AMYLASE" in the last 72 hours. Recent Labs    11/25/22 0011  CKTOTAL 96   Invalid input(s): "POCBNP" No results for input(s): "DDIMER" in the last 72 hours. Recent Labs    11/25/22 0011  HGBA1C 5.9*   No results for input(s): "CHOL", "HDL", "LDLCALC", "TRIG", "CHOLHDL", "LDLDIRECT" in the last 72 hours. Recent Labs    11/25/22 0011  TSH 0.649   Recent Labs    11/25/22 0011  VITAMINB12 641  FOLATE 26.6  FERRITIN 1,348*  TIBC 189*  IRON 11*  RETICCTPCT 0.8   Coags: Recent Labs    11/25/22 0011  INR 1.0   Microbiology: Recent Results (from the past 240 hour(s))  Resp panel by RT-PCR (RSV, Flu A&B, Covid) Anterior Nasal Swab     Status: None   Collection Time: 11/24/22 12:11 PM   Specimen: Anterior Nasal Swab  Result Value Ref Range Status   SARS Coronavirus 2 by RT PCR NEGATIVE NEGATIVE Final   Influenza A by PCR NEGATIVE NEGATIVE Final   Influenza B by PCR NEGATIVE NEGATIVE Final    Comment: (NOTE) The Xpert Xpress SARS-CoV-2/FLU/RSV plus assay is intended as an aid in the diagnosis of influenza from Nasopharyngeal swab specimens and should not be used as a sole basis for treatment. Nasal washings and aspirates are unacceptable for Xpert Xpress SARS-CoV-2/FLU/RSV testing.  Fact Sheet for Patients: BloggerCourse.com  Fact Sheet for Healthcare Providers: SeriousBroker.it  This test is not yet approved or cleared by the [place] FDA and has been authorized for detection and/or diagnosis of SARS-CoV-2 by FDA under an Emergency Use Authorization (EUA). This  EUA will remain in effect (meaning this test can be used) for the duration of the COVID-19 declaration under Section 564(b)(1) of the Act, 21 U.S.C. section 360bbb-3(b)(1), unless the authorization is terminated or revoked.     Resp Syncytial Virus by PCR NEGATIVE NEGATIVE Final    Comment: (NOTE) Fact Sheet for Patients: BloggerCourse.com  Fact  Sheet for Healthcare Providers: SeriousBroker.it  This test is not yet approved or cleared by the [place] and has been authorized for detection and/or diagnosis of SARS-CoV-2 by FDA under an Emergency Use Authorization (EUA). This EUA will remain in effect (meaning this test can be used) for the duration of the COVID-19 declaration under Section 564(b)(1) of the Act, 21 U.S.C. section 360bbb-3(b)(1), unless the authorization is terminated or revoked.  Performed at [facility], [address], [place], [place] [address]   Blood culture (routine x 2)     Status: None (Preliminary result)   Collection Time: 11/24/22  4:25 PM   Specimen: BLOOD  Result Value Ref Range Status   Specimen Description BLOOD SITE NOT SPECIFIED  Final   Special Requests   Final    BOTTLES DRAWN AEROBIC AND ANAEROBIC Blood Culture adequate volume   Culture   Final    NO GROWTH 2 DAYS Performed at [facility], [address], [place], [place] [address]    Report Status PENDING  Incomplete  Blood culture (routine x 2)     Status: None (Preliminary result)   Collection Time: 11/24/22  4:35 PM   Specimen: BLOOD LEFT ARM  Result Value Ref Range Status   Specimen Description BLOOD LEFT ARM  Final   Special Requests   Final    BOTTLES DRAWN AEROBIC AND ANAEROBIC Blood Culture adequate volume   Culture   Final    NO GROWTH 2 DAYS Performed at [facility], [address], [place], [place] [address]    Report Status PENDING  Incomplete  Respiratory (~20 pathogens) panel by PCR      Status: None   Collection Time: 11/25/22  3:45 PM   Specimen: Nasopharyngeal Swab; Respiratory  Result Value Ref Range Status   Adenovirus NOT DETECTED NOT DETECTED Final   Coronavirus 229E NOT DETECTED NOT DETECTED Final    Comment: (NOTE) The Coronavirus on the Respiratory Panel, DOES NOT test for the novel  Coronavirus (2019 nCoV)    Coronavirus HKU1 NOT DETECTED NOT DETECTED Final   Coronavirus NL63 NOT DETECTED NOT DETECTED Final   Coronavirus OC43 NOT DETECTED NOT DETECTED Final   Metapneumovirus NOT DETECTED NOT DETECTED Final   Rhinovirus / Enterovirus NOT DETECTED NOT DETECTED Final   Influenza A NOT DETECTED NOT DETECTED Final   Influenza B NOT DETECTED NOT DETECTED Final   Parainfluenza Virus 1 NOT DETECTED NOT DETECTED Final   Parainfluenza Virus 2 NOT DETECTED NOT DETECTED Final   Parainfluenza Virus 3 NOT DETECTED NOT DETECTED Final   Parainfluenza Virus 4 NOT DETECTED NOT DETECTED Final   Respiratory Syncytial Virus NOT DETECTED NOT DETECTED Final   Bordetella pertussis NOT DETECTED NOT DETECTED Final   Bordetella Parapertussis NOT DETECTED NOT DETECTED Final   Chlamydophila pneumoniae NOT DETECTED NOT DETECTED Final   Mycoplasma pneumoniae NOT DETECTED NOT DETECTED Final    Comment: Performed at [facility], [address], [place], [place] [address]  MRSA Next Gen by PCR, Nasal     Status: None   Collection Time: 11/25/22  3:45 PM   Specimen: Nasopharyngeal Swab; Nasal Swab  Result Value Ref Range Status   MRSA by PCR Next Gen NOT DETECTED NOT DETECTED Final    Comment: (NOTE) The GeneXpert MRSA Assay (FDA approved for NASAL specimens only), is one component of a comprehensive MRSA colonization surveillance program. It is not intended to diagnose MRSA infection nor to guide  or monitor treatment for MRSA infections. Test performance is not FDA approved in patients less than 21 years old. Performed at [facility], [address] [place],  [place] [address]     FURTHER DISCHARGE INSTRUCTIONS:  Get Medicines reviewed and adjusted: Please take all your medications with you for your next visit with your Primary MD  Laboratory/radiological data: Please request your Primary MD to go over all hospital tests and procedure/radiological results at the follow up, please ask your Primary MD to get all Hospital records sent to his/her office.  In some cases, they will be blood work, cultures and biopsy results pending at the time of your discharge. Please request that your primary care M.D. goes through all the records of your hospital data and follows up on these results.  Also Note the following: If you experience worsening of your admission symptoms, develop shortness of breath, life threatening emergency, suicidal or homicidal thoughts you must seek medical attention immediately by calling 911 or calling your MD immediately  if symptoms less severe.  You must read complete instructions/literature along with all the possible adverse reactions/side effects for all the Medicines you take and that have been prescribed to you. Take any new Medicines after you have completely understood and accpet all the possible adverse reactions/side effects.   Do not drive when taking Pain medications or sleeping medications (Benzodaizepines)  Do not take more than prescribed Pain, Sleep and Anxiety Medications. It is not advisable to combine anxiety,sleep and pain medications without talking with your primary care practitioner  Special Instructions: If you have smoked or chewed Tobacco  in the last 2 yrs please stop smoking, stop any regular Alcohol  and or any Recreational drug use.  Wear Seat belts while driving.  Please note: You were cared for by a hospitalist during your hospital stay. Once you are discharged, your primary care physician will handle any further medical issues. Please note that NO REFILLS for any discharge medications will be authorized  once you are discharged, as it is imperative that you return to your primary care physician (or establish a relationship with a primary care physician if you do not have one) for your post hospital discharge needs so that they can reassess your need for medications and monitor your lab values.  Total Time spent coordinating discharge including counseling, education and face to face time equals greater than 30 minutes.  SignedJeoffrey Massed 11/26/2022 9:39 AM

## 2022-11-28 ENCOUNTER — Telehealth: Payer: Self-pay

## 2022-11-28 NOTE — Transitions of Care (Post Inpatient/ED Visit) (Signed)
11/28/2022  Name: Jamie Cox MRN: [id number] DOB: [birth year]  Today's TOC FU Call Status: Today's TOC FU Call Status:: Successful TOC FU Call Competed TOC FU Call Complete Date: 11/28/22  Transition Care Management Follow-up Telephone Call Date of Discharge: 11/26/22 Discharge Facility: [facility]) Type of Discharge: Inpatient Admission Primary Inpatient Discharge Diagnosis:: pneumonia How have you been since you were released from the hospital?: Better Any questions or concerns?: No  Items Reviewed: Did you receive and understand the discharge instructions provided?: Yes Medications obtained,verified, and reconciled?: Yes (Medications Reviewed) Any new allergies since your discharge?: No Dietary orders reviewed?: Yes Do you have support at home?: Yes People in Home: spouse, child(ren), adult  Medications Reviewed Today: Medications Reviewed Today     Reviewed by Karena Addison, LPN (Licensed Practical Nurse) on 11/28/22 at 1102  Med List Status: <None>   Medication Order Taking? Sig Documenting Provider Last Dose Status Informant  albuterol (VENTOLIN HFA) 108 (90 Base) MCG/ACT inhaler [id number] No INHALE ONE TO TWO PUFFS INTO THE LUNGS EVERY 6 HOURS AS NEEDED FOR WHEEZING OR SHORTNESS OF BREATH  Patient taking differently: Inhale 1-2 puffs into the lungs every 6 (six) hours as needed for shortness of breath or wheezing. INHALE ONE TO TWO PUFFS INTO THE LUNGS EVERY 6 HOURS AS NEEDED FOR WHEEZING OR SHORTNESS OF BREATH   [name], MD 11/24/2022 Active Self, Pharmacy Records  atorvastatin (LIPITOR) 10 MG tablet [id number] No Take 1 tablet (10 mg total) by mouth daily. NEEDS APPT/LABS [name], MD 11/24/2022 Active Self, Pharmacy Records  benzonatate (TESSALON) 100 MG capsule [id number]  Take 1 capsule (100 mg total) by mouth 3 (three) times daily as needed. Maretta Bees, MD  Active   budesonide-formoterol [facility]) 160-4.5 MCG/ACT inhaler [id number]   Inhale 2 puffs into the lungs 2 (two) times daily. Ghimire, Werner Lean, MD  Active   calcium-vitamin D [facility] WITH D) 500-5 MG-MCG tablet [id number] No Take 1 tablet by mouth daily with breakfast. [provider] Past Week Active Self, Pharmacy Records  cefpodoxime (VANTIN) 200 MG tablet [id number]  Take 0.5 tablets (100 mg total) by mouth 2 (two) times daily. Ghimire, Werner Lean, MD  Active   diphenhydrAMINE (BENADRYL) 25 MG tablet [id number] No Take 25 mg by mouth every 6 (six) hours as needed for allergies. [provider] 11/23/2022 Active Self, Pharmacy Records  famotidine (PEPCID) 40 MG tablet [id number] No TAKE 1 TABLET BY MOUTH EVERY DAY [name], MD 11/23/2022 Active Self, Pharmacy Records  ferrous sulfate 325 (65 FE) MG tablet [id number]  Take 1 tablet (325 mg total) by mouth 2 (two) times daily. Maretta Bees, MD  Active   fluticasone [facility]) 50 MCG/ACT nasal spray [id number] No Place 2 sprays into both nostrils daily. [name], MD 11/23/2022 Active Self, Pharmacy Records  gabapentin (NEURONTIN) 300 MG capsule [id number] No TAKE 1 CAPSULE BY MOUTH THREE TIMES DAILY  Patient taking differently: Take 300 mg by mouth at bedtime.   [name], MD Past Week Active Self, Pharmacy Records  HEMATINIC/FOLIC ACID 324-1 MG TABS [id number] No TAKE 1 TABLET BY MOUTH EVERY DAY [name], MD Past Month Active Self, Pharmacy Records  HYDROcodone-acetaminophen (NORCO/VICODIN) 5-325 MG tablet [id number] No Take 1 tablet by mouth every 6 (six) hours as needed for moderate pain or severe pain. [name], MD 11/23/2022 Active Self, Pharmacy Records  metoprolol succinate (TOPROL-XL) 25 MG 24 hr tablet [id number] No Take 1 tablet (25  mg total) by mouth daily. [name], MD 11/24/2022 12:00 pm Active Self, Pharmacy Records  montelukast (SINGULAIR) 10 MG tablet [id number] No Take 1 tablet (10 mg total) by mouth at bedtime. [name], MD 11/23/2022 Active Self, Pharmacy  Records  predniSONE (DELTASONE) 10 MG tablet [id number]  Take 4 tablets daily for 2 days, 3 tablets daily for 2 days, 2 tablets daily for 2 days,1 tablet daily for 1 days, then resume your usual outpatient regimen of 2.5 mg daily. Maretta Bees, MD  Active   predniSONE (DELTASONE) 2.5 MG tablet [id number] No Take 1 tablet by mouth once daily with breakfast [name], MD 11/23/2022 Active Self, Pharmacy Records  Pseudoephedrine-Guaifenesin [facility] D MAX STRENGTH) [phone] MG TB12 [id number] No Take 1 tablet by mouth 2 (two) times daily. [provider] Past Month Active Self, Pharmacy Records            Home Care and Equipment/Supplies: Were Home Health Services Ordered?: NA Any new equipment or medical supplies ordered?: NA  Functional Questionnaire: Do you need assistance with bathing/showering or dressing?: No Do you need assistance with meal preparation?: No Do you need assistance with eating?: No Do you have difficulty maintaining continence: No Do you need assistance with getting out of bed/getting out of a chair/moving?: No Do you have difficulty managing or taking your medications?: No  Follow up appointments reviewed: PCP Follow-up appointment confirmed?: Yes Date of PCP follow-up appointment?: 12/07/22 Follow-up Provider: blyth Specialist [facility] Follow-up appointment confirmed?: NA Do you need transportation to your follow-up appointment?: No Do you understand care options if your condition(s) worsen?: Yes-patient verbalized understanding    SIGNATURE Karena Addison, LPN [facility] Nurse Health Advisor Direct Dial [phone]

## 2022-11-28 NOTE — Telephone Encounter (Signed)
This RNCM received call from patient requesting note for work during recent ED visit on 11/24/22. This RNCM will email to patient at email: [email or web address] Notified patient of work note being emailed. No additional TOC needs at this time.

## 2022-11-29 LAB — CULTURE, BLOOD (ROUTINE X 2)
Culture: NO GROWTH
Culture: NO GROWTH
Special Requests: ADEQUATE
Special Requests: ADEQUATE

## 2022-11-30 ENCOUNTER — Telehealth: Payer: Self-pay

## 2022-11-30 ENCOUNTER — Encounter: Payer: Self-pay | Admitting: Family Medicine

## 2022-11-30 ENCOUNTER — Ambulatory Visit: Payer: BC Managed Care – PPO | Admitting: Family Medicine

## 2022-11-30 VITALS — BP 148/83 | HR 76 | Ht 61.0 in | Wt 105.0 lb

## 2022-11-30 DIAGNOSIS — D509 Iron deficiency anemia, unspecified: Secondary | ICD-10-CM | POA: Diagnosis not present

## 2022-11-30 DIAGNOSIS — M069 Rheumatoid arthritis, unspecified: Secondary | ICD-10-CM

## 2022-11-30 DIAGNOSIS — J189 Pneumonia, unspecified organism: Secondary | ICD-10-CM | POA: Diagnosis not present

## 2022-11-30 DIAGNOSIS — R531 Weakness: Secondary | ICD-10-CM | POA: Diagnosis not present

## 2022-11-30 DIAGNOSIS — Z09 Encounter for follow-up examination after completed treatment for conditions other than malignant neoplasm: Secondary | ICD-10-CM

## 2022-11-30 LAB — COMPREHENSIVE METABOLIC PANEL
ALT: 14 U/L (ref 0–35)
AST: 13 U/L (ref 0–37)
Albumin: 3.8 g/dL (ref 3.5–5.2)
Alkaline Phosphatase: 74 U/L (ref 39–117)
BUN: 8 mg/dL (ref 6–23)
CO2: 32 mEq/L (ref 19–32)
Calcium: 10.9 mg/dL — ABNORMAL HIGH (ref 8.4–10.5)
Chloride: 101 mEq/L (ref 96–112)
Creatinine, Ser: 0.79 mg/dL (ref 0.40–1.20)
GFR: 79.16 mL/min (ref >60.00)
Glucose, Bld: 85 mg/dL (ref 70–99)
Potassium: 4.7 mEq/L (ref 3.5–5.1)
Sodium: 140 mEq/L (ref 135–145)
Total Bilirubin: 0.3 mg/dL (ref 0.2–1.2)
Total Protein: 7.7 g/dL (ref 6.0–8.3)

## 2022-11-30 LAB — CBC WITH DIFFERENTIAL/PLATELET
Basophils Relative: 1.1 % (ref 0.0–3.0)
Eosinophils Relative: 0.4 % (ref 0.0–5.0)
HCT: 35.4 % — ABNORMAL LOW (ref 36.0–46.0)
Hemoglobin: 10.9 g/dL — ABNORMAL LOW (ref 12.0–15.0)
Lymphocytes Relative: 26.6 % (ref 12.0–46.0)
MCHC: 30.7 g/dL (ref 30.0–36.0)
MCV: 73.9 fl — ABNORMAL LOW (ref 78.0–100.0)
Monocytes Relative: 7.1 % (ref 3.0–12.0)
Neutrophils Relative %: 64.8 % (ref 43.0–77.0)
Platelets: 360 10*3/uL (ref 150.0–400.0)
RBC: 4.8 Mil/uL (ref 3.87–5.11)
RDW: 15.8 % — ABNORMAL HIGH (ref 11.5–15.5)
WBC: 8.4 10*3/uL (ref 4.0–10.5)

## 2022-11-30 MED ORDER — HYDROCODONE-ACETAMINOPHEN 5-325 MG PO TABS
1.0000 | ORAL_TABLET | Freq: Four times a day (QID) | ORAL | 0 refills | Status: DC | PRN
Start: 2022-11-30 — End: 2023-02-28

## 2022-11-30 NOTE — Patient Instructions (Addendum)
Generalized muscle weakness likely due to deconditioning from recent illness and hospital stay. Neuro exam normal today. Repeat labs. Discussed alarm findings to monitor for.  Work note provided. Keep previously scheduled appointment for next week. Make sure you have a routine follow-up with your pulmonologist scheduled.

## 2022-11-30 NOTE — Progress Notes (Signed)
Acute Office Visit  Subjective:     Patient ID: Jamie Cox, female    DOB: 28-Sep-1960, 62 y.o.   MRN: [id number]  Chief Complaint  Patient presents with   Hospitalization Follow-up    HPI Patient is in today for hospital follow-up.  Discussed the use of AI scribe software for clinical note transcription with the patient, who gave verbal consent to proceed.  History of Present Illness   The patient, with a history of asthma and rheumatoid arthritis, was recently hospitalized for pneumonia and an asthma exacerbation. The patient was treated with IV antibiotics and discharged with a prednisone taper and bronchodilators. The patient reports feeling much better since discharge, with breathing close to baseline. The patient also reports muscle weakness and fatigue, particularly affecting her ability to walk. The patient describes the weakness as intermittent and affecting both sides, but more noticeable on the right side. The patient is currently taking hydrocodone for chronic pain management (RA) as needed - requesting refill.            ROS All review of systems negative except what is listed in the HPI      Objective:    BP (!) 148/83   Pulse 76   Ht 5\' 1"  (1.549 m)   Wt 105 lb (47.6 kg)   SpO2 97%   BMI 19.84 kg/m    Physical Exam Vitals reviewed.  Constitutional:      Appearance: Normal appearance.  Cardiovascular:     Rate and Rhythm: Normal rate and regular rhythm.     Pulses: Normal pulses.     Heart sounds: Normal heart sounds.  Pulmonary:     Effort: Pulmonary effort is normal.     Breath sounds: Normal breath sounds.  Skin:    General: Skin is warm and dry.  Neurological:     General: No focal deficit present.     Mental Status: She is alert and oriented to person, place, and time. Mental status is at baseline.     Cranial Nerves: No cranial nerve deficit.     Sensory: No sensory deficit.     Motor: No weakness.     Coordination: Coordination  normal.     Gait: Gait normal.  Psychiatric:        Mood and Affect: Mood normal.        Behavior: Behavior normal.        Thought Content: Thought content normal.        Judgment: Judgment normal.     No results found for any visits on 11/30/22.      Assessment & Plan:   Problem List Items Addressed This Visit     Anemia - Primary   Relevant Orders   CBC with Differential/Platelet   CAP (community acquired pneumonia)   Relevant Orders   CBC with Differential/Platelet   Comprehensive metabolic panel   Other Visit Diagnoses     Hospital discharge follow-up       Relevant Orders   CBC with Differential/Platelet   Comprehensive metabolic panel   Rheumatoid arthritis (HCC)       Relevant Medications   HYDROcodone-acetaminophen (NORCO/VICODIN) 5-325 MG tablet   Generalized weakness          Assessment and Plan    Pneumonia and Asthma Exacerbation: Recent hospitalization for pneumonia and asthma exacerbation. Currently on prednisone taper with improvement in symptoms. No current fever or chest pain. -Continue prednisone taper and return to chronic steroid dose in 3  days. -Use incentive spirometer to maintain lung function. -Ensure follow-up with pulmonologist.  Anemia: Mild anemia thought to be due to chronic disease. Recently started on iron supplementation. -Continue iron supplementation. -Recheck CBC today.  Muscle Weakness: Reports of intermittent muscle weakness affecting mobility, more noticeable on the right side. Neurological exam normal. Possible deconditioning from recent hospitalization  -Continue home exercises and consider physical therapy if no improvement. -Recheck labs today. -Educated on symptoms to monitor for and when to seek emergent care  Hypertension: Elevated blood pressure likely due to increased prednisone dose. -Recheck blood pressure at next week routine follow-up  Chronic Pain: Reports of chronic pain managed with hydrocodone. -Refill  hydrocodone prescription.  Rheumatoid Arthritis: Chronic condition managed by a specialist. -Continue current management and follow-up with specialist.          Meds ordered this encounter  Medications   HYDROcodone-acetaminophen (NORCO/VICODIN) 5-325 MG tablet    Sig: Take 1 tablet by mouth every 6 (six) hours as needed for moderate pain or severe pain.    Dispense:  40 tablet    Refill:  0    Order Specific Question:   Supervising Provider    Answer:   Danise Edge A [[id number]]    Return in about 3 months (around 03/02/2023), or if symptoms worsen or fail to improve, for (keep upcoming appointments).  Clayborne Dana, NP

## 2022-11-30 NOTE — Telephone Encounter (Signed)
This RNCM called patient re: previously requested work note that was emailed on 11/28/22 as this RNCM received message that email was never opened. This RNCM spoke with patient who reports she received a note from her PCP today.  No additioal TOC needs.

## 2022-12-05 ENCOUNTER — Telehealth: Payer: Self-pay | Admitting: Family Medicine

## 2022-12-05 NOTE — Telephone Encounter (Signed)
Yes, that's fine 

## 2022-12-05 NOTE — Telephone Encounter (Signed)
Called patient and discussed. Letter written. Will pick up at her appt on Wednesday.

## 2022-12-05 NOTE — Telephone Encounter (Signed)
Okay to write

## 2022-12-05 NOTE — Telephone Encounter (Signed)
Pt called stating that she needed another day for work and when she returns she needs to be on "light" duty due to her walking still being an issue.

## 2022-12-07 ENCOUNTER — Ambulatory Visit (INDEPENDENT_AMBULATORY_CARE_PROVIDER_SITE_OTHER): Payer: BC Managed Care – PPO | Admitting: Family Medicine

## 2022-12-07 ENCOUNTER — Ambulatory Visit (HOSPITAL_BASED_OUTPATIENT_CLINIC_OR_DEPARTMENT_OTHER)
Admission: RE | Admit: 2022-12-07 | Discharge: 2022-12-07 | Disposition: A | Discharge status: Home / Self Care | Payer: BC Managed Care – PPO | Source: Ambulatory Visit | Attending: Family Medicine | Admitting: Family Medicine

## 2022-12-07 ENCOUNTER — Telehealth: Payer: Self-pay | Admitting: *Deleted

## 2022-12-07 ENCOUNTER — Encounter: Payer: Self-pay | Admitting: Family Medicine

## 2022-12-07 VITALS — BP 126/73 | HR 95 | Ht 61.0 in | Wt 104.0 lb

## 2022-12-07 DIAGNOSIS — M545 Low back pain, unspecified: Secondary | ICD-10-CM | POA: Insufficient documentation

## 2022-12-07 DIAGNOSIS — R202 Paresthesia of skin: Secondary | ICD-10-CM | POA: Diagnosis not present

## 2022-12-07 DIAGNOSIS — Z79899 Other long term (current) drug therapy: Secondary | ICD-10-CM | POA: Diagnosis not present

## 2022-12-07 DIAGNOSIS — Z0001 Encounter for general adult medical examination with abnormal findings: Secondary | ICD-10-CM | POA: Diagnosis not present

## 2022-12-07 DIAGNOSIS — M810 Age-related osteoporosis without current pathological fracture: Secondary | ICD-10-CM

## 2022-12-07 LAB — CALCIUM: Calcium: 10.4 mg/dL (ref 8.4–10.5)

## 2022-12-07 LAB — VITAMIN D 25 HYDROXY (VIT D DEFICIENCY, FRACTURES): VITD: 31.79 ng/mL (ref 30.00–100.00)

## 2022-12-07 MED ORDER — DENOSUMAB 60 MG/ML ~~LOC~~ SOSY
60.0000 mg | PREFILLED_SYRINGE | Freq: Once | SUBCUTANEOUS | Status: AC
Start: 2022-12-07 — End: 2022-12-07
  Administered 2022-12-07: 60 mg via SUBCUTANEOUS

## 2022-12-07 NOTE — Progress Notes (Signed)
Complete physical exam  Patient: Jamie Cox   DOB: 07/29/1960   62 y.o. Female  MRN: [id number]  Subjective:    Chief Complaint  Patient presents with   Annual Exam    Jamie Cox is a 62 y.o. female who presents today for a complete physical exam. She reports consuming a low sodium diet. Home exercise routine includes walking. She generally feels fairly well. She reports sleeping fairly well. She does have additional problems to discuss today.   Currently lives with: husband and son Acute concerns or interim problems since last visit:  - She is still having frequent right leg discomfort/stiffness with occasional numbness/tingling- she does have some lower back discomfort/fatigue as well. This all started after lying in the bed for a few days while in the hospital. States there has been some progress since initially leaving the hospital, but she is planning to mention it to her orthopedic provider at upcoming appointment. I will go ahead with lumbar xray today. Denies saddle paresthesia, incontinence, etc. Patient aware of signs/symptoms requiring further/urgent evaluation.   Vision concerns: no concerns, sees eye doctor  Dental concerns: no concerns, sees dentist STD concerns: no  Patient denies ETOH use. Patient denies nicotine use. Patient denies illegal substance use.       Most recent fall risk assessment:    12/07/2022   10:25 AM  Fall Risk   Falls in the past year? 1  Number falls in past yr: 1  Injury with Fall? 0  Risk for fall due to : History of fall(s)  Follow up Falls evaluation completed     Most recent depression screenings:    12/07/2022   10:46 AM 11/30/2021    2:05 PM  PHQ 2/9 Scores  PHQ - 2 Score 0 0  PHQ- 9 Score 3             Patient Care Team: [name], MD as PCP - General (Family Medicine) Mardella Layman, MD as Consulting Physician (Gastroenterology)   Outpatient Medications Prior to Visit  Medication Sig    albuterol (VENTOLIN HFA) 108 (90 Base) MCG/ACT inhaler INHALE ONE TO TWO PUFFS INTO THE LUNGS EVERY 6 HOURS AS NEEDED FOR WHEEZING OR SHORTNESS OF BREATH (Patient taking differently: Inhale 1-2 puffs into the lungs every 6 (six) hours as needed for shortness of breath or wheezing. INHALE ONE TO TWO PUFFS INTO THE LUNGS EVERY 6 HOURS AS NEEDED FOR WHEEZING OR SHORTNESS OF BREATH)   atorvastatin (LIPITOR) 10 MG tablet Take 1 tablet (10 mg total) by mouth daily. NEEDS APPT/LABS   benzonatate (TESSALON) 100 MG capsule Take 1 capsule (100 mg total) by mouth 3 (three) times daily as needed.   budesonide-formoterol (SYMBICORT) 160-4.5 MCG/ACT inhaler Inhale 2 puffs into the lungs 2 (two) times daily.   calcium-vitamin D (OSCAL WITH D) 500-5 MG-MCG tablet Take 1 tablet by mouth daily with breakfast.   famotidine (PEPCID) 40 MG tablet TAKE 1 TABLET BY MOUTH EVERY DAY   ferrous sulfate 325 (65 FE) MG tablet Take 1 tablet (325 mg total) by mouth 2 (two) times daily.   fluticasone (FLONASE) 50 MCG/ACT nasal spray Place 2 sprays into both nostrils daily.   gabapentin (NEURONTIN) 300 MG capsule TAKE 1 CAPSULE BY MOUTH THREE TIMES DAILY (Patient taking differently: Take 300 mg by mouth at bedtime.)   HEMATINIC/FOLIC ACID 324-1 MG TABS TAKE 1 TABLET BY MOUTH EVERY DAY   HYDROcodone-acetaminophen (NORCO/VICODIN) 5-325 MG tablet Take 1 tablet by  mouth every 6 (six) hours as needed for moderate pain or severe pain.   metoprolol succinate (TOPROL-XL) 25 MG 24 hr tablet Take 1 tablet (25 mg total) by mouth daily.   montelukast (SINGULAIR) 10 MG tablet Take 1 tablet (10 mg total) by mouth at bedtime.   predniSONE (DELTASONE) 10 MG tablet Take 4 tablets daily for 2 days, 3 tablets daily for 2 days, 2 tablets daily for 2 days,1 tablet daily for 1 days, then resume your usual outpatient regimen of 2.5 mg daily.   predniSONE (DELTASONE) 2.5 MG tablet Take 1 tablet by mouth once daily with breakfast    Pseudoephedrine-Guaifenesin (MUCINEX D MAX STRENGTH) [phone] MG TB12 Take 1 tablet by mouth 2 (two) times daily.   No facility-administered medications prior to visit.    ROS All review of systems negative except what is listed in the HPI        Objective:     BP 126/73   Pulse 95   Ht 5\' 1"  (1.549 m)   Wt 104 lb (47.2 kg)   BMI 19.65 kg/m    Physical Exam Vitals reviewed.  Constitutional:      General: She is not in acute distress.    Appearance: Normal appearance. She is not ill-appearing.  HENT:     Head: Normocephalic and atraumatic.     Right Ear: Tympanic membrane normal.     Left Ear: Tympanic membrane normal.     Nose: Nose normal.     Mouth/Throat:     Mouth: Mucous membranes are moist.     Pharynx: Oropharynx is clear.  Eyes:     Extraocular Movements: Extraocular movements intact.     Conjunctiva/sclera: Conjunctivae normal.     Pupils: Pupils are equal, round, and reactive to light.  Neck:     Vascular: No carotid bruit.  Cardiovascular:     Rate and Rhythm: Normal rate and regular rhythm.     Pulses: Normal pulses.     Heart sounds: Normal heart sounds.  Pulmonary:     Effort: Pulmonary effort is normal.     Breath sounds: Normal breath sounds.  Abdominal:     General: Abdomen is flat. Bowel sounds are normal. There is no distension.     Palpations: Abdomen is soft. There is no mass.     Tenderness: There is no abdominal tenderness. There is no right CVA tenderness, left CVA tenderness, guarding or rebound.  Genitourinary:    Comments: Deferred exam Musculoskeletal:        General: Normal range of motion.     Cervical back: Normal range of motion and neck supple. No tenderness.     Right lower leg: No edema.     Left lower leg: No edema.  Lymphadenopathy:     Cervical: No cervical adenopathy.  Skin:    General: Skin is warm and dry.     Capillary Refill: Capillary refill takes less than 2 seconds.  Neurological:     General: No focal  deficit present.     Mental Status: She is alert and oriented to person, place, and time. Mental status is at baseline.     Cranial Nerves: No cranial nerve deficit.     Motor: No weakness.     Coordination: Coordination normal.  Psychiatric:        Mood and Affect: Mood normal.        Behavior: Behavior normal.        Thought Content: Thought content normal.  Judgment: Judgment normal.           No results found for any visits on 12/07/22.     Assessment & Plan:    Routine Health Maintenance and Physical Exam Discussed health promotion and safety including diet and exercise recommendations, dental health, and injury prevention. Tobacco cessation if applicable. Seat belts, sunscreen, smoke detectors, etc.    Immunization History  Administered Date(s) Administered   Influenza Split 04/05/2011   Influenza Whole 04/10/2008, 05/15/2009, 05/21/2010, 02/05/2012   Influenza,inj,Quad PF,6+ Mos 08/06/2013, 05/09/2016, 04/10/2018   Janssen (J&J) SARS-COV-2 Vaccination 08/17/2019   PFIZER Comirnaty(Gray Top)Covid-19 Tri-Sucrose Vaccine 10/27/2020   PNEUMOCOCCAL CONJUGATE-20 09/28/2020   Pfizer Covid-19 Vaccine Bivalent Booster 30yrs & up 06/11/2021   Pneumococcal Polysaccharide-23 07/01/2010   Td 05/15/2009    Health Maintenance  Topic Date Due   OPHTHALMOLOGY EXAM  Never done   Zoster Vaccines- Shingrix (1 of 2) Never done   PAP SMEAR-Modifier  03/05/2019   DTaP/Tdap/Td (2 - Tdap) 05/16/2019   COVID-19 Vaccine (4 - 2023-24 season) 02/04/2022   Colonoscopy  10/16/2022   MAMMOGRAM  10/28/2022   Diabetic kidney evaluation - Urine ACR  12/01/2022   FOOT EXAM  06/28/2023 (Originally 09/12/1970)   INFLUENZA VACCINE  01/05/2023   HEMOGLOBIN A1C  05/27/2023   Diabetic kidney evaluation - eGFR measurement  11/30/2023   Hepatitis C Screening  Completed   HIV Screening  Completed   HPV VACCINES  Aged Out   Declined additional vaccines today. She will schedule pap with her GYN.       Problem List Items Addressed This Visit     Hypercalcemia Recheck labs today    Relevant Orders   VITAMIN D 25 Hydroxy (Vit-D Deficiency, Fractures)   Calcium   High risk medication use Contract updated today    Relevant Orders   DRUG MONITORING PANEL [phone] , URINE   Other Visit Diagnoses     Acute right-sided low back pain, unspecified whether sciatica present    -  Primary Low back xray to see if we can find anything to explain your back/leg symptoms.  It is a good sign that you are making gradual progress - continue with home stretches/exercise and follow-up with your orthopedic provider For any warning signs we discussed ,go to the ED.    Relevant Orders   DG Lumbar Spine 2-3 Views   Right leg paresthesias     See above   Relevant Orders   DG Lumbar Spine 2-3 Views   Encounter for routine adult health examination with abnormal findings          Return in about 3 months (around 03/09/2023) for routine follow-up.     Clayborne Dana, NP

## 2022-12-07 NOTE — Telephone Encounter (Signed)
Prolia given today. 

## 2022-12-07 NOTE — Patient Instructions (Signed)
Low back xray to see if we can find anything to explain your back/leg symptoms.  It is a good sign that you are making gradual progress - continue with home stretches/exercise and follow-up with your orthopedic provider For any warning signs we discussed ,go to the ED.

## 2022-12-10 LAB — DRUG MONITORING PANEL 375977 , URINE

## 2022-12-10 LAB — DM TEMPLATE

## 2022-12-13 ENCOUNTER — Other Ambulatory Visit: Payer: Self-pay

## 2022-12-13 ENCOUNTER — Other Ambulatory Visit: Payer: Self-pay | Admitting: Family Medicine

## 2022-12-13 ENCOUNTER — Telehealth: Payer: Self-pay | Admitting: Family Medicine

## 2022-12-13 DIAGNOSIS — M79661 Pain in right lower leg: Secondary | ICD-10-CM

## 2022-12-13 MED ORDER — BENZONATATE 100 MG PO CAPS: 100.0000 mg | ORAL_CAPSULE | Freq: Three times a day (TID) | ORAL | 0 refills | Status: DC | PRN

## 2022-12-13 NOTE — Telephone Encounter (Signed)
Refill sent.

## 2022-12-13 NOTE — Telephone Encounter (Signed)
Patient would like a med refill on the benzonatate (TESSALON) 100 MG capsule. Please send to [facility] on [address].

## 2022-12-21 ENCOUNTER — Telehealth: Payer: Self-pay | Admitting: Family Medicine

## 2022-12-21 ENCOUNTER — Other Ambulatory Visit: Payer: Self-pay

## 2022-12-21 NOTE — Telephone Encounter (Signed)
   Patient called regarding a med refill on meloxicam (MOBIC) 7.5 MG tablet, please send to [facility] on [address].

## 2022-12-22 ENCOUNTER — Ambulatory Visit (HOSPITAL_COMMUNITY)
Admission: EM | Admit: 2022-12-22 | Discharge: 2022-12-22 | Disposition: A | Discharge status: Home / Self Care | Payer: BC Managed Care – PPO | Attending: Family Medicine | Admitting: Family Medicine

## 2022-12-22 ENCOUNTER — Telehealth: Payer: Self-pay | Admitting: Family Medicine

## 2022-12-22 ENCOUNTER — Ambulatory Visit (HOSPITAL_COMMUNITY): Payer: BC Managed Care – PPO

## 2022-12-22 ENCOUNTER — Encounter (HOSPITAL_COMMUNITY): Payer: Self-pay

## 2022-12-22 ENCOUNTER — Other Ambulatory Visit: Payer: Self-pay

## 2022-12-22 DIAGNOSIS — M79661 Pain in right lower leg: Secondary | ICD-10-CM

## 2022-12-22 DIAGNOSIS — M25561 Pain in right knee: Secondary | ICD-10-CM

## 2022-12-22 DIAGNOSIS — M25469 Effusion, unspecified knee: Secondary | ICD-10-CM

## 2022-12-22 MED ORDER — MELOXICAM 7.5 MG PO TABS
ORAL_TABLET | ORAL | 2 refills | Status: DC
Start: 2022-12-22 — End: 2022-12-22

## 2022-12-22 MED ORDER — MELOXICAM 7.5 MG PO TABS: ORAL_TABLET | ORAL | 0 refills | Status: DC

## 2022-12-22 NOTE — Telephone Encounter (Signed)
Pt triaged to UC

## 2022-12-22 NOTE — Telephone Encounter (Signed)
FYI: This call has been transferred to triage nurse: the Triage Nurse. Once the result note has been entered staff can address the message at that time.  Patient called in with the following symptoms:  Red Word: Swollen knee   Please advise at Mobile [phone] (mobile)  Message is routed to Provider Pool.

## 2022-12-22 NOTE — ED Provider Notes (Signed)
[facility]    CSN: [id number] Arrival date & time: 12/22/22  1446      History   Chief Complaint Chief Complaint  Patient presents with   Joint Swelling    HPI Jamie Cox is a 62 y.o. female.   HPI Patient presents today with right calf pain which is causing her to have stiffness in her knee x 2 days. She reports today she has noticed swelling on the front of her knee.  Patient denies any history of previous knee injuries.  Of note patient has rheumatoid arthritis.   Patient endorses that she did have a fall about 4 weeks ago but did not injure her ankle or any of her extremities.   She works as a Advertising copywriter at [employer] and is on her feet for most of the day but does not recall twisting her knee.  She has been taking her prescribed pain medicine and Tylenol with temporary relief of pain.  Past Medical History:  Diagnosis Date   Allergy    Asthma    Atypical chest pain    Chronic sinusitis    Followed by Dr. Jenne Pane   Environmental allergies    GERD (gastroesophageal reflux disease)    High serum parathyroid hormone (PTH) 06/09/2016   History of chicken pox    Hypercalcemia 07/19/2015   Hypertension    Osteoporosis, unspecified    steroid induced   Personal history of other diseases of circulatory system    Rheumatoid arthritis(714.0)    Unspecified deficiency anemia    microcytic   Urticaria     Patient Active Problem List   Diagnosis Date Noted   CAP (community acquired pneumonia) 11/25/2022   Sepsis (HCC) 11/25/2022   Fracture of middle phalanx of finger 07/07/2022   Left otitis media 04/12/2022   Bronchitis 04/12/2022   Paresthesias 12/01/2021   Peroneal tendinitis of right lower extremity 07/09/2021   Leg length discrepancy 07/09/2021   Controlled type 2 diabetes mellitus without complication, without long-term current use of insulin (HCC) 09/28/2020   Chronic rhinitis 09/23/2020   Bilateral impacted cerumen 08/03/2020   COVID-19  09/19/2019   Atypical chest pain 08/09/2017   Onychomycosis 05/08/2017   Cough variant asthma with possible UACS component  05/05/2017   Asthma, chronic obstructive, with acute exacerbation (HCC) 02/13/2017   Allergy 08/03/2016   Maxillary sinusitis, chronic 08/03/2016   Bronchiectasis without acute exacerbation (HCC) 06/29/2016   High serum parathyroid hormone (PTH) 06/09/2016   Cervical cancer screening 03/04/2016   Hypercalcemia 07/19/2015   History of chicken pox    On prednisone therapy 09/25/2013   Intercostal muscle pain 09/20/2013   Preventative health care 08/06/2013   GERD (gastroesophageal reflux disease) 08/01/2012   Tachycardia 07/28/2012   Health care maintenance 01/31/2012   Chronic sinusitis 01/31/2012   High risk medication use 08/17/2011   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      Home Medications    Prior to  Admission medications   Medication Sig Start Date End Date Taking? Authorizing Provider  albuterol (VENTOLIN HFA) 108 (90 Base) MCG/ACT inhaler INHALE ONE TO TWO PUFFS INTO THE LUNGS EVERY 6 HOURS AS NEEDED FOR WHEEZING OR SHORTNESS OF BREATH Patient taking differently: Inhale 1-2 puffs into the lungs every 6 (six) hours as needed for shortness of breath or wheezing. INHALE ONE TO TWO PUFFS INTO THE LUNGS EVERY 6 HOURS AS NEEDED FOR WHEEZING OR SHORTNESS OF BREATH 01/17/22   [name], MD  atorvastatin (LIPITOR) 10 MG tablet Take 1 tablet (10 mg total)  by mouth daily. NEEDS APPT/LABS 11/21/22   [name], MD  benzonatate (TESSALON) 100 MG capsule Take 1 capsule (100 mg total) by mouth 3 (three) times daily as needed. 12/13/22   [name], MD  budesonide-formoterol [facility]) 160-4.5 MCG/ACT inhaler Inhale 2 puffs into the lungs 2 (two) times daily. 11/26/22   Ghimire, Werner Lean, MD  calcium-vitamin D (OSCAL WITH D) 500-5 MG-MCG tablet Take 1 tablet by mouth daily with breakfast.    [provider]  famotidine (PEPCID) 40 MG tablet TAKE 1 TABLET BY MOUTH EVERY DAY 10/20/22   [name], MD  ferrous sulfate 325 (65 FE) MG tablet Take 1 tablet (325 mg total) by mouth 2 (two) times daily. 11/26/22   Ghimire, Werner Lean, MD  fluticasone (FLONASE) 50 MCG/ACT nasal spray Place 2 sprays into both nostrils daily. 06/21/22   [name], MD  gabapentin (NEURONTIN) 300 MG capsule TAKE 1 CAPSULE BY MOUTH THREE TIMES DAILY Patient taking differently: Take 300 mg by mouth at bedtime. 10/21/22   [name], MD  HEMATINIC/FOLIC ACID 324-1 MG TABS TAKE 1 TABLET BY MOUTH EVERY DAY 06/30/22   [name], MD  HYDROcodone-acetaminophen (NORCO/VICODIN) 5-325 MG tablet Take 1 tablet by mouth every 6 (six) hours as needed for moderate pain or severe pain. 11/30/22   Clayborne Dana, NP  meloxicam (MOBIC) 7.5 MG tablet TAKE 1 TO 2 TABLETS BY MOUTH ONCE DAILY AS NEEDED FOR PAIN 12/22/22   [name], MD  metoprolol succinate (TOPROL-XL) 25 MG 24 hr tablet Take 1 tablet (25 mg total) by mouth daily. 10/05/22   [name], MD  montelukast (SINGULAIR) 10 MG tablet Take 1 tablet (10 mg total) by mouth at bedtime. 10/03/22   [name], MD  predniSONE (DELTASONE) 10 MG tablet Take 4 tablets daily for 2 days, 3 tablets daily for 2 days, 2 tablets daily for 2 days,1 tablet daily for 1 days, then resume your usual outpatient regimen of 2.5 mg daily. 11/26/22   Ghimire, Werner Lean, MD  predniSONE (DELTASONE) 2.5 MG tablet Take 1 tablet by mouth  once daily with breakfast 11/21/22   [name], MD  Pseudoephedrine-Guaifenesin (MUCINEX D MAX STRENGTH) [phone] MG TB12 Take 1 tablet by mouth 2 (two) times daily.    [provider]    Family History Family History  Problem Relation Age of Onset   Liver cancer Maternal Grandmother    Hypertension Maternal Grandmother    Heart attack Maternal Grandmother    Scleroderma Mother    Heart disease Mother    Kidney disease Mother    Other Mother    Prostate cancer Father    Diabetes Father    Heart disease Father        CHF   Arthritis Father        s/p hip replacement   Cancer Father  lung cancer with mets, smoker   Lung cancer Paternal Uncle    Coronary artery disease Paternal Grandmother    Hypertension Paternal Grandmother    Liver cancer Paternal Grandmother    Hyperlipidemia Son    Alzheimer's disease Paternal Grandfather    Other Sister        Cardiomegaly   Arthritis Sister    Heart disease Sister        rare   Sleep apnea Brother    Heart disease Brother    Arthritis Daughter    Lupus Cousin    Arthritis Cousin    Colon cancer Neg Hx    Asthma Neg Hx    Allergic rhinitis Neg Hx    Eczema Neg Hx    Immunodeficiency Neg Hx    Angioedema Neg Hx     Social History Social History   Tobacco Use   Smoking status: Never   Smokeless tobacco: Never  Vaping Use   Vaping status: Never Used  Substance Use Topics   Alcohol use: No    Alcohol/week: 0.0 standard drinks of alcohol   Drug use: No     Allergies   Avelox [moxifloxacin hcl in nacl], Bactrim [sulfamethoxazole-trimethoprim], Doxycycline, Sulfamethoxazole-trimethoprim, Amoxicillin, and Augmentin [amoxicillin-pot clavulanate]   Review of Systems Review of Systems Pertinent negatives listed in HPI   Physical Exam Triage Vital Signs ED Triage Vitals  Encounter Vitals Group     BP 12/22/22 1544 125/75     Systolic BP Percentile --      Diastolic BP Percentile --      Pulse Rate  12/22/22 1544 93     Resp 12/22/22 1544 14     Temp 12/22/22 1544 98 F (36.7 C)     Temp Source 12/22/22 1544 Oral     SpO2 12/22/22 1544 95 %     Weight --      Height --      Head Circumference --      Peak Flow --      Pain Score 12/22/22 1546 3     Pain Loc --      Pain Education --      Exclude from Growth Chart --    No data found.  Updated Vital Signs BP 125/75 (BP Location: Right Arm)   Pulse 93   Temp 98 F (36.7 C) (Oral)   Resp 14   SpO2 95%   Visual Acuity Right Eye Distance:   Left Eye Distance:   Bilateral Distance:    Right Eye Near:   Left Eye Near:    Bilateral Near:     Physical Exam Vitals reviewed.  Constitutional:      Appearance: She is underweight.  HENT:     Head: Normocephalic.  Cardiovascular:     Rate and Rhythm: Normal rate and regular rhythm.  Pulmonary:     Effort: Pulmonary effort is normal.     Breath sounds: Normal breath sounds.  Neurological:     Mental Status: She is alert. Mental status is at baseline.     Motor: No weakness.     Coordination: Coordination normal.     Gait: Gait abnormal.     Deep Tendon Reflexes: Reflexes normal.      UC Treatments / Results  Labs (all labs ordered are listed, but only abnormal results are displayed) Labs Reviewed - No data to display  EKG   Radiology DG Knee Complete 4 Views Right  Result Date: 12/22/2022 CLINICAL DATA:  Calf and knee pain and swelling EXAM: RIGHT KNEE - COMPLETE 4+ VIEW COMPARISON:  None Available. FINDINGS: No evidence of fracture, dislocation, or joint effusion. No evidence of arthropathy or other focal bone abnormality. Soft tissues are unremarkable. IMPRESSION: Negative. Electronically Signed   By: Corlis Leak M.D.   On: 12/22/2022 17:16    Procedures Procedures (including critical care time)  Medications Ordered in UC Medications - No data to display  Initial Impression / Assessment and Plan / UC Course  I have reviewed the triage vital signs and  the nursing notes.  Pertinent labs & imaging results that were available during my care of the patient were reviewed by me and considered in my medical decision making (see chart for details).   Posterior knee, right  Knee swelling  Imaging of the right knee unremarkable. Patient is prescribed Meloxicam. Will encourage her to take meloxicam. Ccontinue to perform range of motion exercises to 3 stiffness. If problem persist follow-up with Dr. Magnus Sinning Final Clinical Impressions(s) / UC Diagnoses   Final diagnoses:  Posterior knee pain, right  Knee swelling     Discharge Instructions      Your x-ray is negative for any findings to explain your symptoms. I recommend taking the Meloxicam prescribed by your PCP. If knee pain persists,  follow-up with the sports medicine provider listed.     ED Prescriptions   None    PDMP not reviewed this encounter.   Bing Neighbors, NP 12/22/22 1725

## 2022-12-22 NOTE — Telephone Encounter (Signed)
Initial Comment Caller states their knee is swollen and sore. Translation No Nurse Assessment Nurse: Lalla Brothers, RN, April Date/Time Lamount Cohen Time): 12/22/2022 12:56:20 PM Confirm and document reason for call. If symptomatic, describe symptoms. ---Caller states that her right knee is swollen and sore calf since yesterday. The swelling went down overnight but then came back when she got up. Pain is 5/10 when walking. Denies injury or hx of blood clots. Does the patient have any new or worsening symptoms? ---Yes Will a triage be completed? ---Yes Related visit to physician within the last 2 weeks? ---No Does the PT have any chronic conditions? (i.e. diabetes, asthma, this includes High risk factors for pregnancy, etc.) ---Yes List chronic conditions. ---RA, HTN Is this a behavioral health or substance abuse call? ---No Guidelines Guideline Title Affirmed Question Affirmed Notes Nurse Date/Time Lamount Cohen Time) Leg Swelling and Edema [1] Thigh or calf pain AND [2] only 1 side AND [3] present > 1 hour Lalla Brothers, RN, April 12/22/2022 12:59:47 PM Disp. Time Lamount Cohen Time) Disposition Final User 12/22/2022 1:03:12 PM See HCP within 4 Hours (or PCP triage) Yes Lalla Brothers, RN, April PLEASE NOTE: All timestamps contained within this report are represented as [place] Standard Time. CONFIDENTIALTY NOTICE: This fax transmission is intended only for the addressee. It contains information that is legally privileged, confidential or otherwise protected from use or disclosure. If you are not the intended recipient, you are strictly prohibited from reviewing, disclosing, copying using or disseminating any of this information or taking any action in reliance on or regarding this information. If you have received this fax in error, please notify us immediately by telephone so that we can arrange for its return to [place]. Phone: [phone], Toll-Free: [phone], Fax: [phone] Page: 2 of 2 Call Id:  [id number] Disp. Time Lamount Cohen Time) Disposition Final User 12/22/2022 1:08:42 PM Called On-Call Provider Lalla Brothers, RN, April Reason: RN contacted ofc, no appts avail today. Caller instructed to go to [facility] and she verbalized understanding. Final Disposition 12/22/2022 1:03:12 PM See HCP within 4 Hours (or PCP triage) Yes Lalla Brothers, RN, April Caller Disagree/Comply Comply Caller Understands Yes PreDisposition Go to Urgent Care/Walk-In Clinic Care Advice Given Per Guideline SEE HCP (OR PCP TRIAGE) WITHIN 4 HOURS: * IF OFFICE WILL BE OPEN: You need to be seen within the next 3 or 4 hours. Call your doctor (or NP/PA) now or as soon as the office opens. CALL EMS IF: * Chest pain or shortness of breath occurs. CARE ADVICE given per Leg Swelling and Edema (Adult) guideline. Referrals REFERRED TO PCP OFFICE [facility] at [facility]

## 2022-12-22 NOTE — Telephone Encounter (Signed)
Medication refill, Called pt was advised.

## 2022-12-22 NOTE — Telephone Encounter (Signed)
Pt was seen at ER. 

## 2022-12-22 NOTE — ED Triage Notes (Signed)
Patient reports that she noted that her right calf was sore 2 days ago and is now having right knee pain and swelling since last night.  Patient states she took Hydrocodone and Tylenol at 1030 today for the pain.

## 2022-12-22 NOTE — Discharge Instructions (Signed)
Your x-ray is negative for any findings to explain your symptoms. I recommend taking the Meloxicam prescribed by your PCP. If knee pain persists,  follow-up with the sports medicine provider listed.

## 2022-12-26 ENCOUNTER — Ambulatory Visit (INDEPENDENT_AMBULATORY_CARE_PROVIDER_SITE_OTHER): Payer: BC Managed Care – PPO | Admitting: Internal Medicine

## 2022-12-26 ENCOUNTER — Encounter: Payer: Self-pay | Admitting: Internal Medicine

## 2022-12-26 VITALS — BP 112/70 | HR 95 | Ht 60.0 in | Wt 105.0 lb

## 2022-12-26 DIAGNOSIS — M0579 Rheumatoid arthritis with rheumatoid factor of multiple sites without organ or systems involvement: Secondary | ICD-10-CM

## 2022-12-26 DIAGNOSIS — J479 Bronchiectasis, uncomplicated: Secondary | ICD-10-CM | POA: Diagnosis not present

## 2022-12-26 MED ORDER — LEVOCETIRIZINE DIHYDROCHLORIDE 5 MG PO TABS: 5.0000 mg | ORAL_TABLET | Freq: Every evening | ORAL | 5 refills | Status: DC

## 2022-12-26 NOTE — Patient Instructions (Addendum)
Please schedule follow up scheduled with myself in 6 months.  If my schedule is not open yet, we will contact you with a reminder closer to that time. Please call [phone] if you haven't heard from [place] a month before.   Continue the symbicort for now. I am getting a CT Scan of your chest to make sure the changes in your lungs from last year are stable.   Keep taking singulair and flonase. I am adding xyzal which is an allergy pill to your list. Do not take allegra or benadryl with this.  Come see me sooner if you developing shortness of breath, fevers, chills, night sweats or weight loss.  If your coughing gets worse and with more mucus please come see [place] and bring [place] sputum cultures.

## 2022-12-26 NOTE — Progress Notes (Signed)
Jamie Cox    [id number]    01-Dec-1960  Primary Care Physician:Blyth, Bryon Lions, MD Date of Appointment: 12/26/2022 Established Patient Visit  Chief complaint:   Chief Complaint  Patient presents with   Follow-up    Breathing has been good      HPI: Jamie Cox is a 62 y.o. 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   Interval Updates: She is here for follow up after hospital stay June 2024. Had rapid improvement with IV abx.   She also had an RA flare last week in her right knee. She took some nsaids with improvement. No additional prednisone for this.   Still has intermittent cough from sinus drainage. Takes flonase and this helps as well as mucinex. Also on singulair and xyzal.   Appetite is ok. She hast lost a little weight.   RA symptoms - She is on prednisone 2.5 mg daily no steroid sparing therapy. Sees Azucena Fallen NP at [facility].    Past Medical History:  Diagnosis Date   Allergy    Asthma    Atypical chest pain    Chronic sinusitis    Followed by Dr. Jenne Pane   Environmental allergies    GERD (gastroesophageal reflux disease)    High serum parathyroid hormone (PTH) 06/09/2016   History of chicken pox    Hypercalcemia 07/19/2015   Hypertension    Osteoporosis, unspecified    steroid induced   Personal history of other diseases of circulatory system    Rheumatoid arthritis(714.0)    Unspecified deficiency anemia    microcytic   Urticaria     Past Surgical History:  Procedure Laterality Date   NASAL SINUS SURGERY  06/2012   NASAL SINUS SURGERY     2010   OPEN REDUCTION INTERNAL FIXATION (ORIF) METACARPAL Right 07/13/2022   Procedure: OPEN REDUCTION INTERNAL FIXATION METACARPAL RIGHT SMALL FINGER;  Surgeon: Bradly Bienenstock, MD;  Location: [facility];  Service: Orthopedics;  Laterality: Right;  regional with iv sedation    Family History  Problem Relation Age of Onset   Liver  cancer Maternal Grandmother    Hypertension Maternal Grandmother    Heart attack Maternal Grandmother    Scleroderma Mother    Heart disease Mother    Kidney disease Mother    Other Mother    Prostate cancer Father    Diabetes Father    Heart disease Father        CHF   Arthritis Father        s/p hip replacement   Cancer Father        lung cancer with mets, smoker   Lung cancer Paternal Uncle    Coronary artery disease Paternal Grandmother    Hypertension Paternal Grandmother    Liver cancer Paternal Grandmother    Hyperlipidemia Son    Alzheimer's disease Paternal Grandfather    Other Sister        Cardiomegaly   Arthritis Sister    Heart disease Sister        rare   Sleep apnea Brother    Heart disease Brother    Arthritis Daughter    Lupus Cousin    Arthritis Cousin    Colon cancer Neg Hx    Asthma Neg Hx    Allergic rhinitis Neg Hx    Eczema Neg Hx    Immunodeficiency Neg Hx    Angioedema Neg Hx  Social History   Occupational History   Occupation: Data processing manager  Tobacco Use   Smoking status: Never   Smokeless tobacco: Never  Vaping Use   Vaping status: Never Used  Substance and Sexual Activity   Alcohol use: No    Alcohol/week: 0.0 standard drinks of alcohol   Drug use: No   Sexual activity: Not Currently     Physical Exam: Blood pressure 112/70, pulse 95, height 5' (1.524 m), weight 105 lb (47.6 kg), SpO2 95%.  Gen:      NAD.  Lungs:    no wheezes or crackles or rhonchi CV:      RRR no mrg MSK:      no acute synovitis   Data Reviewed: Imaging: I have personally reviewed the CT Chest obtained July 2023 shows stable bronchiectasis. Stable adenopathy.  PFTs:     Latest Ref Rng & Units 03/30/2022    9:36 AM 02/04/2021   10:11 AM 03/21/2017    9:55 AM 06/28/2016   12:58 PM 05/06/2016    1:59 PM 09/05/2014   11:06 AM  PFT Results  FVC-Pre L 1.82  1.83  2.28  1.92  1.65  2.39   FVC-Predicted Pre % 63  80  96  84  72  102   FVC-Post L  1.88  1.80   1.96  1.75  2.38   FVC-Predicted Post % 65  78   85  76  102   Pre FEV1/FVC % % 77  77  71  83  84  72   Post FEV1/FCV % % 78  81   83  82  72   FEV1-Pre L 1.40  1.40  1.63  1.59  1.38  1.73   FEV1-Predicted Pre % 63  78  87  88  76  94   FEV1-Post L 1.46  1.46   1.62  1.43  1.73   DLCO uncorrected ml/min/mmHg 11.77  11.87   13.41  8.52  12.63   DLCO UNC% % 65  65   71  45  66   DLCO corrected ml/min/mmHg 11.77  11.87   13.14  9.62    DLCO COR %Predicted % 65  65   69  51    DLVA Predicted % 90  100   106  87  91   TLC L 3.34  3.64   3.31  3.27  4.03   TLC % Predicted % 72  79   74  73  90   RV % Predicted % 64  96   75  97  103    I have personally reviewed the patient's PFTs and they show mild restriction to ventilation, no airflow limitation, no significant response to bronchodilator. Moderately reduced diffusion capacity. Reduced lung function from 2018 to 2022  Labs: Lab Results  Component Value Date   WBC 8.4 11/30/2022   HGB 10.9 (L) 11/30/2022   HCT 35.4 (L) 11/30/2022   MCV 73.9 (L) 11/30/2022   PLT 360.0 11/30/2022   Lab Results  Component Value Date   NA 140 11/30/2022   K 4.7 11/30/2022   CL 101 11/30/2022   CO2 32 11/30/2022    Immunization status: Immunization History  Administered Date(s) Administered   Influenza Split 04/05/2011   Influenza Whole 04/10/2008, 05/15/2009, 05/21/2010, 02/05/2012   Influenza,inj,Quad PF,6+ Mos 08/06/2013, 05/09/2016, 04/10/2018   Janssen (J&J) SARS-COV-2 Vaccination 08/17/2019   PFIZER Comirnaty(Gray Top)Covid-19 Tri-Sucrose Vaccine 10/27/2020   PNEUMOCOCCAL CONJUGATE-20 09/28/2020  Research officer, trade [employer] 26yrs & up 06/11/2021   Pneumococcal Polysaccharide-23 07/01/2010   Td 05/15/2009    Assessment:  Bronchiectasis, suspect secondary to RA airway involvement.  Rheumatoid Arthritis, not well controlled with recent flare.  Abnormal CT Chest with Concern for MAI infection Multiple  pulmonary nodules Right knee pain  Plan/Recommendations:  Continue the symbicort for now. I am getting a CT Scan of your chest to make sure the changes in your lungs from last year are stable.   Keep taking singulair and flonase. I am adding xyzal which is an allergy pill to your list. Do not take allegra or benadryl with this.  Come see me sooner if you developing shortness of breath, fevers, chills, night sweats or weight loss.  If your coughing gets worse and with more mucus please come see [place] and bring [place] sputum cultures.    Return to Care: Return in about 6 months (around 06/28/2023).   Durel Salts, MD Pulmonary and Critical Care Medicine [facility] [phone]

## 2022-12-30 ENCOUNTER — Other Ambulatory Visit (HOSPITAL_COMMUNITY): Payer: Self-pay

## 2022-12-30 ENCOUNTER — Other Ambulatory Visit: Payer: Self-pay | Admitting: Family Medicine

## 2023-01-03 ENCOUNTER — Other Ambulatory Visit: Payer: Self-pay | Admitting: Family Medicine

## 2023-01-03 ENCOUNTER — Ambulatory Visit: Payer: BC Managed Care – PPO | Admitting: Podiatry

## 2023-01-03 ENCOUNTER — Encounter: Payer: Self-pay | Admitting: Podiatry

## 2023-01-03 DIAGNOSIS — L84 Corns and callosities: Secondary | ICD-10-CM | POA: Diagnosis not present

## 2023-01-03 DIAGNOSIS — B351 Tinea unguium: Secondary | ICD-10-CM | POA: Diagnosis not present

## 2023-01-03 DIAGNOSIS — M79676 Pain in unspecified toe(s): Secondary | ICD-10-CM

## 2023-01-03 DIAGNOSIS — E119 Type 2 diabetes mellitus without complications: Secondary | ICD-10-CM | POA: Diagnosis not present

## 2023-01-03 NOTE — Patient Instructions (Signed)
n

## 2023-01-03 NOTE — Progress Notes (Signed)
  Subjective:  Patient ID: Jamie Cox, female    DOB: [birth year],   MRN: [id number]  Chief Complaint  Patient presents with   Nail Problem    RFC    62 y.o. female presents for concern of thickened elongated and painful nails that are difficult to trim. Requesting to have them trimmed today. Relates burning and tingling in their feet. Patient is diabetic and last A1c was  Lab Results  Component Value Date   HGBA1C 5.9 (H) 11/25/2022   .   PCP:  [name], MD    . Denies any other pedal complaints. Denies n/v/f/c.   Past Medical History:  Diagnosis Date   Allergy    Asthma    Atypical chest pain    Chronic sinusitis    Followed by Dr. Jenne Pane   Environmental allergies    GERD (gastroesophageal reflux disease)    High serum parathyroid hormone (PTH) 06/09/2016   History of chicken pox    Hypercalcemia 07/19/2015   Hypertension    Osteoporosis, unspecified    steroid induced   Personal history of other diseases of circulatory system    Rheumatoid arthritis(714.0)    Unspecified deficiency anemia    microcytic   Urticaria     Objective:  Physical Exam: Vascular: DP/PT pulses 2/4 bilateral. CFT <3 seconds. Absent hair growth on digits. Edema noted to bilateral lower extremities. Xerosis noted bilaterally.  Skin. No lacerations or abrasions bilateral feet. Nails 1-5 bilateral  are thickened discolored and elongated with subungual debris. Hyperkeratotic lesions noted sub second and third metatarsal bilateral and sub fifth metatarsal on the left. Dorsal hyperkeratosis noted to second and third digits bilateral.   Musculoskeletal: MMT 5/5 bilateral lower extremities in DF, PF, Inversion and Eversion. Deceased ROM in DF of ankle joint.  Neurological: Sensation intact to light touch. Protective sensation diminished bilateral.     Assessment:   1. Pain due to onychomycosis of toenail   2. Controlled type 2 diabetes mellitus without complication, without long-term  current use of insulin (HCC)   3. Callus      Plan:  Patient was evaluated and treated and all questions answered. -Discussed and educated patient on diabetic foot care, especially with  regards to the vascular, neurological and musculoskeletal systems.  -Stressed the importance of good glycemic control and the detriment of not  controlling glucose levels in relation to the foot. -Discussed supportive shoes at all times and checking feet regularly.  -Mechanically debrided all nails 1-5 bilateral using sterile nail nipper and filed with dremel without incident  -Hyperkeratotic tissue debrided without incident with chisel x 5.  -Answered all patient questions -Patient to return  in 3 months for at risk foot care -Patient advised to call the office if any problems or questions arise in the meantime.   Louann Sjogren, DPM

## 2023-01-12 ENCOUNTER — Other Ambulatory Visit: Payer: Self-pay | Admitting: Family Medicine

## 2023-01-13 ENCOUNTER — Ambulatory Visit: Admission: RE | Admit: 2023-01-13 | Payer: BC Managed Care – PPO | Source: Ambulatory Visit

## 2023-01-13 DIAGNOSIS — M0579 Rheumatoid arthritis with rheumatoid factor of multiple sites without organ or systems involvement: Secondary | ICD-10-CM

## 2023-01-13 DIAGNOSIS — J479 Bronchiectasis, uncomplicated: Secondary | ICD-10-CM

## 2023-02-09 ENCOUNTER — Telehealth: Payer: Self-pay | Admitting: Medical Oncology

## 2023-02-09 ENCOUNTER — Telehealth: Payer: Self-pay | Admitting: Internal Medicine

## 2023-02-09 ENCOUNTER — Inpatient Hospital Stay: Payer: BC Managed Care – PPO

## 2023-02-09 ENCOUNTER — Other Ambulatory Visit: Payer: Self-pay | Admitting: Medical Oncology

## 2023-02-09 ENCOUNTER — Inpatient Hospital Stay: Payer: BC Managed Care – PPO | Attending: Internal Medicine | Admitting: Internal Medicine

## 2023-02-09 ENCOUNTER — Other Ambulatory Visit: Payer: Self-pay | Admitting: Family Medicine

## 2023-02-09 VITALS — BP 133/70 | HR 73 | Temp 98.1°F | Resp 15 | Ht 60.0 in | Wt 103.2 lb

## 2023-02-09 DIAGNOSIS — Z7951 Long term (current) use of inhaled steroids: Secondary | ICD-10-CM | POA: Insufficient documentation

## 2023-02-09 DIAGNOSIS — Z801 Family history of malignant neoplasm of trachea, bronchus and lung: Secondary | ICD-10-CM | POA: Insufficient documentation

## 2023-02-09 DIAGNOSIS — J479 Bronchiectasis, uncomplicated: Secondary | ICD-10-CM | POA: Diagnosis not present

## 2023-02-09 DIAGNOSIS — Z809 Family history of malignant neoplasm, unspecified: Secondary | ICD-10-CM | POA: Diagnosis not present

## 2023-02-09 DIAGNOSIS — M069 Rheumatoid arthritis, unspecified: Secondary | ICD-10-CM | POA: Insufficient documentation

## 2023-02-09 DIAGNOSIS — Z8042 Family history of malignant neoplasm of prostate: Secondary | ICD-10-CM | POA: Insufficient documentation

## 2023-02-09 DIAGNOSIS — Z79899 Other long term (current) drug therapy: Secondary | ICD-10-CM | POA: Diagnosis not present

## 2023-02-09 DIAGNOSIS — K219 Gastro-esophageal reflux disease without esophagitis: Secondary | ICD-10-CM | POA: Diagnosis not present

## 2023-02-09 DIAGNOSIS — I10 Essential (primary) hypertension: Secondary | ICD-10-CM | POA: Diagnosis not present

## 2023-02-09 DIAGNOSIS — I700 Atherosclerosis of aorta: Secondary | ICD-10-CM | POA: Insufficient documentation

## 2023-02-09 DIAGNOSIS — M810 Age-related osteoporosis without current pathological fracture: Secondary | ICD-10-CM | POA: Insufficient documentation

## 2023-02-09 DIAGNOSIS — D509 Iron deficiency anemia, unspecified: Secondary | ICD-10-CM | POA: Insufficient documentation

## 2023-02-09 DIAGNOSIS — J45909 Unspecified asthma, uncomplicated: Secondary | ICD-10-CM | POA: Diagnosis not present

## 2023-02-09 DIAGNOSIS — J439 Emphysema, unspecified: Secondary | ICD-10-CM | POA: Diagnosis not present

## 2023-02-09 LAB — CBC WITH DIFFERENTIAL (CANCER CENTER ONLY)
Abs Immature Granulocytes: 0.01 10*3/uL (ref 0.00–0.07)
Basophils Absolute: 0 10*3/uL (ref 0.0–0.1)
Basophils Relative: 1 %
Eosinophils Absolute: 0 10*3/uL (ref 0.0–0.5)
Eosinophils Relative: 1 %
HCT: 34.4 % — ABNORMAL LOW (ref 36.0–46.0)
Hemoglobin: 11.2 g/dL — ABNORMAL LOW (ref 12.0–15.0)
Immature Granulocytes: 0 %
Lymphocytes Relative: 21 %
Lymphs Abs: 0.9 10*3/uL (ref 0.7–4.0)
MCH: 23.6 pg — ABNORMAL LOW (ref 26.0–34.0)
MCHC: 32.6 g/dL (ref 30.0–36.0)
MCV: 72.4 fL — ABNORMAL LOW (ref 80.0–100.0)
Monocytes Absolute: 0.3 10*3/uL (ref 0.1–1.0)
Monocytes Relative: 7 %
Neutro Abs: 3.1 10*3/uL (ref 1.7–7.7)
Neutrophils Relative %: 70 %
Platelet Count: 151 10*3/uL (ref 150–400)
RBC: 4.75 MIL/uL (ref 3.87–5.11)
RDW: 15.8 % — ABNORMAL HIGH (ref 11.5–15.5)
WBC Count: 4.3 10*3/uL (ref 4.0–10.5)
nRBC: 0 % (ref 0.0–0.2)

## 2023-02-09 LAB — IRON AND IRON BINDING CAPACITY (CC-WL,HP ONLY)
Iron: 68 ug/dL (ref 28–170)
Saturation Ratios: 23 % (ref 10.4–31.8)
TIBC: 294 ug/dL (ref 250–450)
UIBC: 226 ug/dL (ref 148–442)

## 2023-02-09 LAB — CMP (CANCER CENTER ONLY)
ALT: 12 U/L (ref 0–44)
AST: 17 U/L (ref 15–41)
Albumin: 4.3 g/dL (ref 3.5–5.0)
Alkaline Phosphatase: 61 U/L (ref 38–126)
Anion gap: 7 (ref 5–15)
BUN: 20 mg/dL (ref 8–23)
CO2: 27 mmol/L (ref 22–32)
Calcium: 9.5 mg/dL (ref 8.9–10.3)
Chloride: 106 mmol/L (ref 98–111)
Creatinine: 0.83 mg/dL (ref 0.44–1.00)
GFR, Estimated: >60 mL/min (ref >60)
Glucose, Bld: 93 mg/dL (ref 70–99)
Potassium: 3.7 mmol/L (ref 3.5–5.1)
Sodium: 140 mmol/L (ref 135–145)
Total Bilirubin: 0.3 mg/dL (ref 0.3–1.2)
Total Protein: 7.7 g/dL (ref 6.5–8.1)

## 2023-02-09 LAB — FOLATE: Folate: 24.8 ng/mL (ref >5.9)

## 2023-02-09 LAB — FERRITIN: Ferritin: 1202 ng/mL — ABNORMAL HIGH (ref 11–307)

## 2023-02-09 LAB — VITAMIN B12: Vitamin B-12: 170 pg/mL — ABNORMAL LOW (ref 180–914)

## 2023-02-09 NOTE — Telephone Encounter (Signed)
I called Jamie Cox young NP at [facility] - she hasn't been seen in 1.5 years. They tried to call to schedule her, and also sent a letter, no response. We discussed that her CT scan shows concern that the Rheumatoid Arthritis is affecting her lungs and she needs more treatment beyond prednisone. Please call patient - let her know to follow up with rheumatology so we can coordinate medication for her beyond just prednisone.

## 2023-02-09 NOTE — Progress Notes (Signed)
[facility] Telephone:([phone]) [phone]   Fax:([phone]) [phone]  CONSULT NOTE  REFERRING PHYSICIAN: Dr. Reuel Derby  REASON FOR CONSULTATION:  62 years old African-American female with persistent microcytic anemia.  HPI Jamie Cox is a 62 y.o. female with past medical history significant for multiple medical problems including history of allergy, asthma, GERD, hypertension, osteoporosis, rheumatoid arthritis as well as history of microcytic anemia that has been going on for several years.  The patient mentions that she was recently complaining of right leg weakness and she was seen by orthopedic surgery and during her evaluation she had blood work that showed persistent microcytic anemia.  She was admitted to the hospital in June 2022 with sepsis secondary to pneumonia.  The patient was immune compromised at that time and has been on chronic steroids but she had rapid improvement with Rocephin and azithromycin.  During her evaluation her CBC showed persistent anemia with hemoglobin of 8.2 and hematocrit 25.7% with MCV of 70.6.  On July 13, 2022 she also had significant anemia with hemoglobin was down to 6.5 and hematocrit 20.9%.  On November 25, 2022 her iron was 11 with iron saturation of 6% but ferritin was elevated at 1348 probably as an acute phase reactant during her diagnosis with the pneumonia.  The patient has been on oral iron tablet in addition to folic acid and has been tolerating it fairly well.  Her last EGD was on 01/10/2017 and that showed some evidence of gastritis.  Her last colonoscopy was on Oct 15, 2012 and she is past due for her repeat colonoscopy.  She is feeling fine today with no concerning complaints except for the soreness in her legs and feet as well as the weakness in the lower extremities.  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  She has no adverse effect from the oral iron tablets.  She denied having  any current chest pain, shortness of breath, cough or hemoptysis.  She has no nausea, vomiting, diarrhea or constipation.  She has no headache or visual changes. Family history significant for mother with heart disease and kidney disease as well as scleroderma.  Father had prostate cancer and lung cancer.  Sister had cancer of unknown type. The patient is married and has 3 children.  She works as a Advertising copywriter at [employer] and works mainly night shifts.  She has no history of smoking, alcohol or drug abuse. HPI  Past Medical History:  Diagnosis Date   Allergy    Asthma    Atypical chest pain    Chronic sinusitis    Followed by Dr. Jenne Pane   Environmental allergies    GERD (gastroesophageal reflux disease)    High serum parathyroid hormone (PTH) 06/09/2016   History of chicken pox    Hypercalcemia 07/19/2015   Hypertension    Osteoporosis, unspecified    steroid induced   Personal history of other diseases of circulatory system    Rheumatoid arthritis(714.0)    Unspecified deficiency anemia    microcytic   Urticaria     Past Surgical History:  Procedure Laterality Date   NASAL SINUS SURGERY  06/2012   NASAL SINUS SURGERY     2010   OPEN REDUCTION INTERNAL FIXATION (ORIF) METACARPAL Right 07/13/2022   Procedure: OPEN REDUCTION INTERNAL FIXATION METACARPAL RIGHT SMALL FINGER;  Surgeon: Bradly Bienenstock, MD;  Location: [facility] OR;  Service: Orthopedics;  Laterality: Right;  regional with iv sedation  Family History  Problem Relation Age of Onset   Liver cancer Maternal Grandmother    Hypertension Maternal Grandmother    Heart attack Maternal Grandmother    Scleroderma Mother    Heart disease Mother    Kidney disease Mother    Other Mother    Prostate cancer Father    Diabetes Father    Heart disease Father        CHF   Arthritis Father        s/p hip replacement   Cancer Father        lung cancer with mets, smoker   Lung cancer Paternal Uncle    Coronary artery disease Paternal  Grandmother    Hypertension Paternal Grandmother    Liver cancer Paternal Grandmother    Hyperlipidemia Son    Alzheimer's disease Paternal Grandfather    Other Sister        Cardiomegaly   Arthritis Sister    Heart disease Sister        rare   Sleep apnea Brother    Heart disease Brother    Arthritis Daughter    Lupus Cousin    Arthritis Cousin    Colon cancer Neg Hx    Asthma Neg Hx    Allergic rhinitis Neg Hx    Eczema Neg Hx    Immunodeficiency Neg Hx    Angioedema Neg Hx     Social History Social History   Tobacco Use   Smoking status: Never   Smokeless tobacco: Never  Vaping Use   Vaping status: Never Used  Substance Use Topics   Alcohol use: No    Alcohol/week: 0.0 standard drinks of alcohol   Drug use: No    Allergies  Allergen Reactions   Avelox [Moxifloxacin Hcl In Nacl] Diarrhea, Nausea And Vomiting and Other (See Comments)    Dizziness, Cold chills    Bactrim [Sulfamethoxazole-Trimethoprim] Hives   Doxycycline     Abdominal discomfort, anorexia   Sulfamethoxazole-Trimethoprim Hives   Amoxicillin Other (See Comments)   Augmentin [Amoxicillin-Pot Clavulanate] Hives, Itching and Swelling    Current Outpatient Medications  Medication Sig Dispense Refill   albuterol (VENTOLIN HFA) 108 (90 Base) MCG/ACT inhaler INHALE ONE TO TWO PUFFS INTO THE LUNGS EVERY 6 HOURS AS NEEDED FOR WHEEZING OR SHORTNESS OF BREATH (Patient taking differently: Inhale 1-2 puffs into the lungs every 6 (six) hours as needed for shortness of breath or wheezing. INHALE ONE TO TWO PUFFS INTO THE LUNGS EVERY 6 HOURS AS NEEDED FOR WHEEZING OR SHORTNESS OF BREATH) 18 each 5   atorvastatin (LIPITOR) 10 MG tablet Take 1 tablet (10 mg total) by mouth daily. 90 tablet 1   benzonatate (TESSALON) 100 MG capsule Take 1 capsule (100 mg total) by mouth 3 (three) times daily as needed. 20 capsule 0   budesonide-formoterol (SYMBICORT) 160-4.5 MCG/ACT inhaler Inhale 2 puffs into the lungs 2 (two)  times daily. 10.2 g 12   calcium-vitamin D (OSCAL WITH D) 500-5 MG-MCG tablet Take 1 tablet by mouth daily with breakfast.     famotidine (PEPCID) 40 MG tablet TAKE 1 TABLET BY MOUTH EVERY DAY 90 tablet 1   ferrous sulfate 325 (65 FE) MG tablet Take 1 tablet (325 mg total) by mouth 2 (two) times daily. 60 tablet 3   fluticasone (FLONASE) 50 MCG/ACT nasal spray Place 2 sprays into both nostrils daily. 16 g 5   gabapentin (NEURONTIN) 300 MG capsule TAKE 1 CAPSULE BY MOUTH THREE TIMES DAILY (Patient taking differently:  Take 300 mg by mouth at bedtime.) 270 capsule 0   HEMATINIC/FOLIC ACID 324-1 MG TABS TAKE 1 TABLET BY MOUTH EVERY DAY 90 tablet 1   HYDROcodone-acetaminophen (NORCO/VICODIN) 5-325 MG tablet Take 1 tablet by mouth every 6 (six) hours as needed for moderate pain or severe pain. 40 tablet 0   levocetirizine (XYZAL) 5 MG tablet Take 1 tablet (5 mg total) by mouth every evening. 30 tablet 5   meloxicam (MOBIC) 7.5 MG tablet TAKE 1 TO 2 TABLETS BY MOUTH ONCE DAILY AS NEEDED FOR PAIN 30 tablet 0   metoprolol succinate (TOPROL-XL) 25 MG 24 hr tablet Take 1 tablet by mouth once daily 90 tablet 0   montelukast (SINGULAIR) 10 MG tablet Take 1 tablet (10 mg total) by mouth at bedtime. 90 tablet 1   predniSONE (DELTASONE) 10 MG tablet Take 4 tablets daily for 2 days, 3 tablets daily for 2 days, 2 tablets daily for 2 days,1 tablet daily for 1 days, then resume your usual outpatient regimen of 2.5 mg daily. 19 tablet 0   predniSONE (DELTASONE) 2.5 MG tablet Take 1 tablet by mouth once daily with breakfast 30 tablet 0   Pseudoephedrine-Guaifenesin (MUCINEX D MAX STRENGTH) [phone] MG TB12 Take 1 tablet by mouth 2 (two) times daily.     No current facility-administered medications for this visit.    Review of Systems  Constitutional: positive for fatigue Eyes: negative Ears, nose, mouth, throat, and face: negative Respiratory: negative Cardiovascular: negative Gastrointestinal:  negative Genitourinary:negative Integument/breast: negative Hematologic/lymphatic: negative Musculoskeletal:positive for arthralgias and muscle weakness Neurological: negative Behavioral/Psych: negative Endocrine: negative Allergic/Immunologic: negative  Physical Exam  YNW:GNFAO, healthy, no distress, well nourished, and well developed SKIN: skin color, texture, turgor are normal, no rashes or significant lesions HEAD: Normocephalic, No masses, lesions, tenderness or abnormalities EYES: normal, PERRLA, Conjunctiva are pink and non-injected EARS: External ears normal, Canals clear OROPHARYNX:no exudate, no erythema, and lips, buccal mucosa, and tongue normal  NECK: supple, no adenopathy, no JVD LYMPH:  no palpable lymphadenopathy, no hepatosplenomegaly BREAST:not examined LUNGS: clear to auscultation , and palpation HEART: regular rate & rhythm, no murmurs, and no gallops ABDOMEN:abdomen soft, non-tender, normal bowel sounds, and no masses or organomegaly BACK: Back symmetric, no curvature., No CVA tenderness EXTREMITIES:no joint deformities, effusion, or inflammation, no edema  NEURO: alert & oriented x 3 with fluent speech, no focal motor/sensory deficits  PERFORMANCE STATUS: ECOG 1  LABORATORY DATA: Lab Results  Component Value Date   WBC 8.4 11/30/2022   HGB 10.9 (L) 11/30/2022   HCT 35.4 (L) 11/30/2022   MCV 73.9 (L) 11/30/2022   PLT 360.0 11/30/2022      Chemistry      Component Value Date/Time   NA 140 11/30/2022 1147   K 4.7 11/30/2022 1147   CL 101 11/30/2022 1147   CO2 32 11/30/2022 1147   BUN 8 11/30/2022 1147   CREATININE 0.79 11/30/2022 1147   CREATININE 0.71 03/31/2020 1108      Component Value Date/Time   CALCIUM 10.4 12/07/2022 1055   ALKPHOS 74 11/30/2022 1147   AST 13 11/30/2022 1147   ALT 14 11/30/2022 1147   BILITOT 0.3 11/30/2022 1147       RADIOGRAPHIC STUDIES: CT Chest Wo Contrast  Result Date: 01/20/2023 CLINICAL DATA:  Lung  nodule follow-up EXAM: CT CHEST WITHOUT CONTRAST TECHNIQUE: Multidetector CT imaging of the chest was performed following the standard protocol without IV contrast. RADIATION DOSE REDUCTION: This exam was performed according to the departmental dose-optimization  program which includes automated exposure control, adjustment of the mA and/or kV according to patient size and/or use of iterative reconstruction technique. COMPARISON:  Chest CT dated December 20, 2021 FINDINGS: Cardiovascular: Normal heart size. No pericardial effusion. Normal caliber thoracic aorta with mild calcified plaque. Severe coronary artery calcifications. 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 Mediastinum/Nodes: Small hiatal hernia. Thyroid is unremarkable. No enlarged lymph nodes seen in the chest. Lungs/Pleura: Central airways are patent. Mild paraseptal emphysema. Motion artifact somewhat limits evaluation of the lower lungs. Bilateral bronchiectasis, most severe in the bilateral lower lobes and right middle lobe. Scattered areas of mucous plugging, overall worsened when compared with the prior exam. New mild patchy ground-glass opacities of the lower lungs. Stable focal linear opacities with associated cystic change, for example posterior left lower lobe on image 5 series 74, likely scarring. Stable bilateral solid pulmonary nodules. Reference nodule of the right middle lobe measuring 4 mm on series 5, image 74. Upper Abdomen: Motion artifact limits evaluation. No gross abnormalities. Musculoskeletal: No chest wall mass or suspicious bone lesions identified. IMPRESSION: 1. Lower lung predominant bronchiectasis. Scattered areas of mucous plugging, overall worsened when compared with the prior exam. Findings may be due to recurrent aspiration or chronic atypical infection such as non tuberculous mycobacterial. 2. New mild patchy ground-glass opacities of the lower lungs, likely infectious or inflammatory. 3. Stable bilateral solid  pulmonary nodules. 4. Aortic Atherosclerosis (ICD10-I70.0) and Emphysema (ICD10-J43.9). Electronically Signed   By: Allegra Lai M.D.   On: 01/20/2023 13:41    ASSESSMENT: This is a very pleasant 62 years old African-American female presented for evaluation of microcytic anemia likely from iron deficiency and probably from a gastrointestinal blood loss.  Her last colonoscopy was more than 10 years ago. The patient is currently on oral iron tablets and folic acid and she is tolerating it fairly well.  PLAN: I had a lengthy discussion with the patient today about her current condition and further investigation to identify the etiology of her anemia and possible treatment options. I ordered several studies today including repeat CBC that showed hemoglobin of 11.1 hematocrit 34.4 with MCV of 72.4.  Comprehensive metabolic panel was unremarkable.  Iron studies showed serum iron of 68 with iron saturation of 23%.  She has low vitamin B12 of 170 and normal serum folate.  Ferritin level and serum protein electrophoresis are still pending. I recommended for the patient to continue on the oral iron tablets with folic acid as planned.  I will also ask the patient to start taking over-the-counter vitamin B12 supplement 1000 mcg p.o. daily. I will see her back for follow-up visit in 2 months for evaluation and repeat blood work. She was advised to call immediately if she has any other concerning symptoms in the interval. The patient voices understanding of current disease status and treatment options and is in agreement with the current care plan.  All questions were answered. The patient knows to call the clinic with any problems, questions or concerns. We can certainly see the patient much sooner if necessary.  Thank you so much for allowing me to participate in the care of Jamie Cox. I will continue to follow up the patient with you and assist in her care.  The total time spent in the appointment  was 60 minutes.  Disclaimer: This note was dictated with voice recognition software. Similar sounding words can inadvertently be transcribed and may not be corrected upon review.   Lajuana Matte February 09, 2023, 11:55 AM

## 2023-02-09 NOTE — Telephone Encounter (Signed)
Low vit b 12= Pt instructed ,per Dr Arbutus Ped, her vitamin b 12 is low and to start OTC vitamin b 12 1000 mcg daily.

## 2023-02-10 ENCOUNTER — Other Ambulatory Visit: Payer: Self-pay

## 2023-02-10 ENCOUNTER — Other Ambulatory Visit: Payer: Self-pay | Admitting: Family Medicine

## 2023-02-10 DIAGNOSIS — E538 Deficiency of other specified B group vitamins: Secondary | ICD-10-CM

## 2023-02-13 LAB — PROTEIN ELECTROPHORESIS, SERUM, WITH REFLEX
A/G Ratio: 1.2 (ref 0.7–1.7)
Albumin ELP: 3.9 g/dL (ref 2.9–4.4)
Alpha-1-Globulin: 0.2 g/dL (ref 0.0–0.4)
Alpha-2-Globulin: 0.8 g/dL (ref 0.4–1.0)
Beta Globulin: 1.2 g/dL (ref 0.7–1.3)
Gamma Globulin: 1.1 g/dL (ref 0.4–1.8)
Globulin, Total: 3.3 g/dL (ref 2.2–3.9)
Total Protein ELP: 7.2 g/dL (ref 6.0–8.5)

## 2023-02-17 ENCOUNTER — Ambulatory Visit: Payer: BC Managed Care – PPO

## 2023-02-21 NOTE — Telephone Encounter (Signed)
Pt has been contacted about this and advised to f/u with rheumatology. Nfn

## 2023-02-23 ENCOUNTER — Encounter: Payer: Self-pay | Admitting: Gastroenterology

## 2023-02-28 ENCOUNTER — Other Ambulatory Visit: Payer: Self-pay

## 2023-02-28 ENCOUNTER — Other Ambulatory Visit (HOSPITAL_BASED_OUTPATIENT_CLINIC_OR_DEPARTMENT_OTHER): Payer: Self-pay

## 2023-02-28 ENCOUNTER — Ambulatory Visit: Payer: BC Managed Care – PPO

## 2023-02-28 ENCOUNTER — Other Ambulatory Visit: Payer: Self-pay | Admitting: Family Medicine

## 2023-02-28 ENCOUNTER — Telehealth: Payer: Self-pay

## 2023-02-28 DIAGNOSIS — E538 Deficiency of other specified B group vitamins: Secondary | ICD-10-CM | POA: Diagnosis not present

## 2023-02-28 DIAGNOSIS — M069 Rheumatoid arthritis, unspecified: Secondary | ICD-10-CM

## 2023-02-28 MED ORDER — CYANOCOBALAMIN 1000 MCG/ML IJ SOLN
1000.0000 ug | Freq: Once | INTRAMUSCULAR | Status: AC
Start: 2023-02-28 — End: ?
  Administered 2023-02-28: 1000 ug via INTRAMUSCULAR

## 2023-02-28 MED ORDER — FLULAVAL 0.5 ML IM SUSY
PREFILLED_SYRINGE | INTRAMUSCULAR | 0 refills | Status: DC
  Filled 2023-02-28 (×2): qty 0.5, 1d supply, fill #0

## 2023-02-28 MED ORDER — COMIRNATY 30 MCG/0.3ML IM SUSY
PREFILLED_SYRINGE | INTRAMUSCULAR | 0 refills | Status: DC
  Filled 2023-02-28 (×2): qty 0.3, 1d supply, fill #0

## 2023-02-28 MED ORDER — HYDROCODONE-ACETAMINOPHEN 5-325 MG PO TABS
1.0000 | ORAL_TABLET | Freq: Four times a day (QID) | ORAL | 0 refills | Status: DC | PRN
Start: 2023-02-28 — End: 2023-06-08

## 2023-02-28 MED ORDER — CYANOCOBALAMIN 1000 MCG/ML IJ SOLN
1000.0000 ug | INTRAMUSCULAR | Status: AC
Start: 2023-03-15 — End: 2023-03-29
  Administered 2023-03-15: 1000 ug via INTRAMUSCULAR

## 2023-02-28 NOTE — Telephone Encounter (Signed)
Pt was in office today for B12 injection asked me to relay the following message to Dr Abner Greenspan:   She needs a Refill on Hydrocodone:  2.  Her Rx for Meloxicam: is written to take 1-2 tabs as needed. She is currently taking 2 tabs every night, and is running out of her Rx. she needs the Quantity to reflect that so her Rx will last all month long. (She has not been able to see PT d/t recovering from Pneumonia, fatigue and ridint the bus.)  Pharmacy: [facility] [place] ([facility] for [facility] since [place] had been out) [phone]  Pending OV on 10/3/2.    Please advise.

## 2023-02-28 NOTE — Progress Notes (Signed)
Jamie Cox is a 62 y.o. female presents to the office today for 1/4 B12  injection?, per physician's orders. Original order: 02/09/23: "Labs stable no new concerns, no changes except vit b12 is low Notify Vitamin B12 is low. Start vitamin B12 shots, 1000 mcg shots IM weekly x 4 doses then biweekly x 4 doses then monthly. Recheck Vitamin B12 level again in roughly 12 weeks. " Cyanocobalamin 1000 mg/ml IM was administered R deltoid today. Patient tolerated injection. Patient due for follow up labs/provider appt: Yes. Date due: 03/09/23, appt made No apt is pending already Patient next injection due: 1 week for 2/4 weekly B12 injection, appt made No- can get at 10/3/ apt  3/4 dose and 4/4 weekly dose to be received at [facility] Location. Approved by Eugenio Hoes. Orders placed for both injections. Apt scheduled for 10/9.    Creft, Feliberto Harts

## 2023-03-08 NOTE — Assessment & Plan Note (Signed)
Encouraged to get adequate exercise, calcium and vitamin d intake 

## 2023-03-08 NOTE — Assessment & Plan Note (Signed)
RRR 

## 2023-03-08 NOTE — Assessment & Plan Note (Signed)
hgba1c acceptable, minimize simple carbs. Increase exercise as tolerated. Continue current meds 

## 2023-03-08 NOTE — Assessment & Plan Note (Signed)
Continues to struggle with cough

## 2023-03-09 ENCOUNTER — Ambulatory Visit: Payer: BC Managed Care – PPO | Admitting: Family Medicine

## 2023-03-09 ENCOUNTER — Ambulatory Visit (HOSPITAL_BASED_OUTPATIENT_CLINIC_OR_DEPARTMENT_OTHER)
Admission: RE | Admit: 2023-03-09 | Discharge: 2023-03-09 | Disposition: A | Discharge status: Home / Self Care | Payer: BC Managed Care – PPO | Source: Ambulatory Visit | Attending: Family Medicine | Admitting: Family Medicine

## 2023-03-09 VITALS — BP 120/78 | HR 78 | Temp 97.9°F | Resp 16 | Ht 61.0 in | Wt 102.0 lb

## 2023-03-09 DIAGNOSIS — E119 Type 2 diabetes mellitus without complications: Secondary | ICD-10-CM | POA: Diagnosis not present

## 2023-03-09 DIAGNOSIS — R053 Chronic cough: Secondary | ICD-10-CM | POA: Insufficient documentation

## 2023-03-09 DIAGNOSIS — R000 Tachycardia, unspecified: Secondary | ICD-10-CM | POA: Diagnosis not present

## 2023-03-09 DIAGNOSIS — R7989 Other specified abnormal findings of blood chemistry: Secondary | ICD-10-CM

## 2023-03-09 DIAGNOSIS — J45991 Cough variant asthma: Secondary | ICD-10-CM

## 2023-03-09 DIAGNOSIS — E538 Deficiency of other specified B group vitamins: Secondary | ICD-10-CM | POA: Diagnosis not present

## 2023-03-09 DIAGNOSIS — M810 Age-related osteoporosis without current pathological fracture: Secondary | ICD-10-CM

## 2023-03-09 LAB — COMPREHENSIVE METABOLIC PANEL
ALT: 10 U/L (ref 0–35)
AST: 16 U/L (ref 0–37)
Albumin: 4.2 g/dL (ref 3.5–5.2)
Alkaline Phosphatase: 54 U/L (ref 39–117)
BUN: 16 mg/dL (ref 6–23)
CO2: 25 meq/L (ref 19–32)
Calcium: 9.5 mg/dL (ref 8.4–10.5)
Chloride: 106 meq/L (ref 96–112)
Creatinine, Ser: 0.74 mg/dL (ref 0.40–1.20)
GFR: 85.46 mL/min (ref >60.00)
Glucose, Bld: 80 mg/dL (ref 70–99)
Potassium: 4 meq/L (ref 3.5–5.1)
Sodium: 140 meq/L (ref 135–145)
Total Bilirubin: 0.3 mg/dL (ref 0.2–1.2)
Total Protein: 7.8 g/dL (ref 6.0–8.3)

## 2023-03-09 LAB — VITAMIN D 25 HYDROXY (VIT D DEFICIENCY, FRACTURES): VITD: 28.95 ng/mL — ABNORMAL LOW (ref 30.00–100.00)

## 2023-03-09 LAB — HEMOGLOBIN A1C: Hgb A1c MFr Bld: 6.7 % — ABNORMAL HIGH (ref 4.6–6.5)

## 2023-03-09 LAB — CBC WITH DIFFERENTIAL/PLATELET
Basophils Absolute: 0.1 10*3/uL (ref 0.0–0.1)
Basophils Relative: 1.1 % (ref 0.0–3.0)
Eosinophils Absolute: 0 10*3/uL (ref 0.0–0.7)
Eosinophils Relative: 0.3 % (ref 0.0–5.0)
HCT: 35.9 % — ABNORMAL LOW (ref 36.0–46.0)
Hemoglobin: 11.2 g/dL — ABNORMAL LOW (ref 12.0–15.0)
Lymphocytes Relative: 17.4 % (ref 12.0–46.0)
Lymphs Abs: 0.9 10*3/uL (ref 0.7–4.0)
MCHC: 31 g/dL (ref 30.0–36.0)
MCV: 73.9 fL — ABNORMAL LOW (ref 78.0–100.0)
Monocytes Absolute: 0.4 10*3/uL (ref 0.1–1.0)
Monocytes Relative: 7.6 % (ref 3.0–12.0)
Neutro Abs: 4 10*3/uL (ref 1.4–7.7)
Neutrophils Relative %: 73.6 % (ref 43.0–77.0)
Platelets: 203 10*3/uL (ref 150.0–400.0)
RBC: 4.87 Mil/uL (ref 3.87–5.11)
RDW: 17 % — ABNORMAL HIGH (ref 11.5–15.5)
WBC: 5.4 10*3/uL (ref 4.0–10.5)

## 2023-03-09 LAB — MICROALBUMIN / CREATININE URINE RATIO
Creatinine,U: 32.3 mg/dL
Microalb Creat Ratio: 2.2 mg/g (ref 0.0–30.0)
Microalb, Ur: <0.7 mg/dL (ref 0.0–1.9)

## 2023-03-09 LAB — LIPID PANEL
Cholesterol: 180 mg/dL (ref 0–200)
HDL: 96.7 mg/dL (ref >39.00)
LDL Cholesterol: 64 mg/dL (ref 0–99)
NonHDL: 82.98
Total CHOL/HDL Ratio: 2
Triglycerides: 97 mg/dL (ref 0.0–149.0)
VLDL: 19.4 mg/dL (ref 0.0–40.0)

## 2023-03-09 LAB — TSH: TSH: 0.68 u[IU]/mL (ref 0.35–5.50)

## 2023-03-09 MED ORDER — HYDROCODONE BIT-HOMATROP MBR 5-1.5 MG/5ML PO SOLN
5.0000 mL | Freq: Four times a day (QID) | ORAL | 0 refills | Status: DC | PRN
Start: 2023-03-09 — End: 2023-04-18

## 2023-03-09 MED ORDER — CYANOCOBALAMIN 1000 MCG/ML IJ SOLN
1000.0000 ug | Freq: Once | INTRAMUSCULAR | Status: AC
Start: 2023-03-09 — End: 2023-03-09
  Administered 2023-03-09: 1000 ug via INTRAMUSCULAR

## 2023-03-09 NOTE — Progress Notes (Signed)
Jamie Cox is a 62 y.o. female presents to the office today for 2/4 B12 injection?, per physician's orders. Original order: 02/09/23: "Labs stable no new concerns, no changes except vit b12 is low Notify Vitamin B12 is low. Start vitamin B12 shots, 1000 mcg shots IM weekly x 4 doses then biweekly x 4 doses then monthly. Recheck Vitamin B12 level again in roughly 12 weeks. " Cyanocobalamin 1000 mg/ml IM was administered R deltoid today. Patient tolerated injection. Patient due for follow up labs/provider appt: Yes. Date due: 03/09/23, appt made No apt is pending already Patient next injection due: 1 week for 2/4 weekly B12 injection, appt made No- can get at 10/3/ apt  3/4 dose and 4/4 weekly dose to be received at [facility] Location. Approved by Eugenio Hoes. Orders placed for both injections. Apt scheduled for 10/9.

## 2023-03-09 NOTE — Patient Instructions (Signed)
Cough, Adult Coughing is a reflex that clears your throat and airways (respiratory system). It helps heal and protect your lungs. It is normal to cough from time to time. A cough that happens with other symptoms or that lasts a long time may be a sign of a condition that needs treatment. A short-term (acute) cough may only last 2-3 weeks. A long-term (chronic) cough may last 8 or more weeks. Coughing is often caused by: Diseases, such as: An infection of the respiratory system. Asthma or other heart or lung diseases. Gastroesophageal reflux. This is when acid comes back up from the stomach. Breathing in things that irritate your lungs. Allergies. Postnasal drip. This is when mucus runs down the back of your throat. Smoking. Some medicines. Follow these instructions at home: Medicines Take over-the-counter and prescription medicines only as told by your health care provider. Talk with your provider before you take cough medicine (cough suppressants). Eating and drinking Do not drink alcohol. Avoid caffeine. Drink enough fluid to keep your pee (urine) pale yellow. Lifestyle Avoid cigarette smoke. Do not use any products that contain nicotine or tobacco. These products include cigarettes, chewing tobacco, and vaping devices, such as e-cigarettes. If you need help quitting, ask your provider. Avoid things that make you cough. These may include perfumes, candles, cleaning products, or campfire smoke. General instructions  Watch for any changes to your cough. Tell your provider about them. Always cover your mouth when you cough. If the air is dry in your bedroom or home, use a cool mist vaporizer or humidifier. If your cough is worse at night, try to sleep in a semi-upright position. Rest as needed. Contact a health care provider if: You have new symptoms, or your symptoms get worse. You cough up pus. You have a fever that does not go away or a cough that does not get better after 2-3  weeks. You cannot control your cough with medicine, and you are losing sleep. You have pain that gets worse or is not helped with medicine. You lose weight for no clear reason. You have night sweats. Get help right away if: You cough up blood. You have trouble breathing. Your heart is beating very fast. These symptoms may be an emergency. Get help right away. Call 911. Do not wait to see if the symptoms will go away. Do not drive yourself to the hospital. This information is not intended to replace advice given to you by your health care provider. Make sure you discuss any questions you have with your health care provider. Document Revised: 01/21/2022 Document Reviewed: 01/21/2022 Elsevier Patient Education  2024 Elsevier Inc.  

## 2023-03-10 ENCOUNTER — Telehealth: Payer: Self-pay

## 2023-03-10 NOTE — Telephone Encounter (Addendum)
Telephone encounter was:  Successful.  03/10/2023 Name: Jamie Cox MRN: [id number] DOB: 24-Jan-1961  Jamie Cox is a 62 y.o. year old female who is a primary care patient of [name], MD . The community resource team was consulted for assistance with Transportation Needs   Care guide performed the following interventions: Patient stated she has a [employer] transportation application and is in the process of completing it. She does not have an immediate need but wanted  backup transportation when needed. Informed patient that if she has an immediate need to please contact her PCP office.  Follow Up Plan:  No further follow up planned at this time. The patient has been provided with needed resources.  Estelita Iten [facility]  [facility], [facility] Guide Direct Dial: [phone]  Website: [name]

## 2023-03-12 NOTE — Progress Notes (Signed)
Subjective:    Patient ID: Jamie Cox, female    DOB: Mar 14, 1961, 62 y.o.   MRN: [id number]  Chief Complaint  Patient presents with   Follow-up    Follow up    HPI Discussed the use of AI scribe software for clinical note transcription with the patient, who gave verbal consent to proceed.  History of Present Illness   The patient, with a history of pneumonia, presents with ongoing fatigue and weakness for several weeks. The patient reports that these symptoms began after their hospitalization for pneumonia and have not improved. The fatigue is severe enough to interfere with daily activities, including work and physical therapy. The patient also reports weakness in the legs, particularly the right leg, which has led to difficulty walking and climbing stairs. The patient has had two falls since their hospitalization in June. The patient has seen an orthopedic doctor and had an MRI of the right hip, but no cause for the leg weakness has been identified. The patient is also receiving B12 injections for a deficiency, which the orthopedic doctor suggested might be contributing to the leg weakness. The patient reports a recurring cough, which has worsened recently. The patient has seen a pulmonologist once since their hospitalization and was prescribed a different allergy medication. The patient has not noticed any improvement with this medication.        Past Medical History:  Diagnosis Date   Allergy    Asthma    Atypical chest pain    Chronic sinusitis    Followed by Dr. Jenne Pane   Environmental allergies    GERD (gastroesophageal reflux disease)    High serum parathyroid hormone (PTH) 06/09/2016   History of chicken pox    Hypercalcemia 07/19/2015   Hypertension    Osteoporosis, unspecified    steroid induced   Personal history of other diseases of circulatory system    Rheumatoid arthritis(714.0)    Unspecified deficiency anemia    microcytic   Urticaria     Past Surgical  History:  Procedure Laterality Date   NASAL SINUS SURGERY  06/2012   NASAL SINUS SURGERY     2010   OPEN REDUCTION INTERNAL FIXATION (ORIF) METACARPAL Right 07/13/2022   Procedure: OPEN REDUCTION INTERNAL FIXATION METACARPAL RIGHT SMALL FINGER;  Surgeon: Bradly Bienenstock, MD;  Location: [facility] OR;  Service: Orthopedics;  Laterality: Right;  regional with iv sedation    Family History  Problem Relation Age of Onset   Liver cancer Maternal Grandmother    Hypertension Maternal Grandmother    Heart attack Maternal Grandmother    Scleroderma Mother    Heart disease Mother    Kidney disease Mother    Other Mother    Prostate cancer Father    Diabetes Father    Heart disease Father        CHF   Arthritis Father        s/p hip replacement   Cancer Father        lung cancer with mets, smoker   Lung cancer Paternal Uncle    Coronary artery disease Paternal Grandmother    Hypertension Paternal Grandmother    Liver cancer Paternal Grandmother    Hyperlipidemia Son    Alzheimer's disease Paternal Grandfather    Other Sister        Cardiomegaly   Arthritis Sister    Heart disease Sister        rare   Sleep apnea Brother    Heart disease Brother  Arthritis Daughter    Lupus Cousin    Arthritis Cousin    Colon cancer Neg Hx    Asthma Neg Hx    Allergic rhinitis Neg Hx    Eczema Neg Hx    Immunodeficiency Neg Hx    Angioedema Neg Hx     Social History   Socioeconomic History   Marital status: Married    Spouse name: Not on file   Number of children: 2   Years of education: Not on file   Highest education level: Not on file  Occupational History   Occupation: Data processing manager  Tobacco Use   Smoking status: Never   Smokeless tobacco: Never  Vaping Use   Vaping status: Never Used  Substance and Sexual Activity   Alcohol use: No    Alcohol/week: 0.0 standard drinks of alcohol   Drug use: No   Sexual activity: Not Currently  Other Topics Concern   Not on file  Social  History Narrative   Not on file   Social Determinants of Health   Financial Resource Strain: Not on file  Food Insecurity: Not on file  Transportation Needs: No Transportation Needs (03/10/2023)   PRAPARE - Administrator, Civil Service (Medical): No    Lack of Transportation (Non-Medical): No  Physical Activity: Not on file  Stress: Not on file  Social Connections: Not on file  Intimate Partner Violence: Not on file    Outpatient Medications Prior to Visit  Medication Sig Dispense Refill   albuterol (VENTOLIN HFA) 108 (90 Base) MCG/ACT inhaler INHALE ONE TO TWO PUFFS INTO THE LUNGS EVERY 6 HOURS AS NEEDED FOR WHEEZING OR SHORTNESS OF BREATH (Patient taking differently: Inhale 1-2 puffs into the lungs every 6 (six) hours as needed for shortness of breath or wheezing. INHALE ONE TO TWO PUFFS INTO THE LUNGS EVERY 6 HOURS AS NEEDED FOR WHEEZING OR SHORTNESS OF BREATH) 18 each 5   atorvastatin (LIPITOR) 10 MG tablet Take 1 tablet (10 mg total) by mouth daily. 90 tablet 1   benzonatate (TESSALON) 100 MG capsule Take 1 capsule (100 mg total) by mouth 3 (three) times daily as needed. 20 capsule 0   budesonide-formoterol (SYMBICORT) 160-4.5 MCG/ACT inhaler Inhale 2 puffs into the lungs 2 (two) times daily. 10.2 g 12   calcium-vitamin D (OSCAL WITH D) 500-5 MG-MCG tablet Take 1 tablet by mouth daily with breakfast.     famotidine (PEPCID) 40 MG tablet TAKE 1 TABLET BY MOUTH EVERY DAY 90 tablet 1   ferrous sulfate 325 (65 FE) MG tablet Take 1 tablet (325 mg total) by mouth 2 (two) times daily. 60 tablet 3   fluticasone (FLONASE) 50 MCG/ACT nasal spray Place 2 sprays into both nostrils daily. 16 g 5   gabapentin (NEURONTIN) 300 MG capsule TAKE 1 CAPSULE BY MOUTH THREE TIMES DAILY (Patient taking differently: Take 300 mg by mouth at bedtime.) 270 capsule 0   HEMATINIC/FOLIC ACID 324-1 MG TABS TAKE 1 TABLET BY MOUTH EVERY DAY 90 tablet 1   HYDROcodone-acetaminophen (NORCO/VICODIN) 5-325 MG  tablet Take 1 tablet by mouth every 6 (six) hours as needed for moderate pain or severe pain. 40 tablet 0   levocetirizine (XYZAL) 5 MG tablet Take 1 tablet (5 mg total) by mouth every evening. 30 tablet 5   meloxicam (MOBIC) 7.5 MG tablet TAKE 1 TO 2 TABLETS BY MOUTH ONCE DAILY AS NEEDED FOR PAIN 30 tablet 0   metoprolol succinate (TOPROL-XL) 25 MG 24 hr tablet Take  1 tablet by mouth once daily 90 tablet 0   montelukast (SINGULAIR) 10 MG tablet Take 1 tablet (10 mg total) by mouth at bedtime. 90 tablet 1   predniSONE (DELTASONE) 10 MG tablet Take 4 tablets daily for 2 days, 3 tablets daily for 2 days, 2 tablets daily for 2 days,1 tablet daily for 1 days, then resume your usual outpatient regimen of 2.5 mg daily. 19 tablet 0   predniSONE (DELTASONE) 2.5 MG tablet Take 1 tablet by mouth once daily with breakfast 30 tablet 0   Pseudoephedrine-Guaifenesin (MUCINEX D MAX STRENGTH) [id number] MG TB12 Take 1 tablet by mouth 2 (two) times daily.     COVID-19 mRNA vaccine, Pfizer, (COMIRNATY) syringe Inject into the muscle. 0.3 mL 0   influenza vac split trivalent PF (FLULAVAL) 0.5 ML injection Inject into the muscle. 0.5 mL 0   Facility-Administered Medications Prior to Visit  Medication Dose Route Frequency Provider Last Rate Last Admin   [START ON 03/15/2023] cyanocobalamin (VITAMIN B12) injection 1,000 mcg  1,000 mcg Intramuscular Weekly [name], MD        Allergies  Allergen Reactions   Avelox [Moxifloxacin Hcl In Nacl] Diarrhea, Nausea And Vomiting and Other (See Comments)    Dizziness, Cold chills    Bactrim [Sulfamethoxazole-Trimethoprim] Hives   Doxycycline     Abdominal discomfort, anorexia   Sulfamethoxazole-Trimethoprim Hives   Amoxicillin Other (See Comments)   Augmentin [Amoxicillin-Pot Clavulanate] Hives, Itching and Swelling    Review of Systems  Constitutional:  Positive for malaise/fatigue. Negative for fever.  HENT:  Positive for congestion.   Eyes:  Negative for  blurred vision.  Respiratory:  Positive for cough and shortness of breath.   Cardiovascular:  Negative for chest pain, palpitations and leg swelling.  Gastrointestinal:  Negative for abdominal pain, blood in stool and nausea.  Genitourinary:  Negative for dysuria and frequency.  Musculoskeletal:  Negative for falls.  Skin:  Negative for rash.  Neurological:  Negative for dizziness, loss of consciousness and headaches.  Endo/Heme/Allergies:  Negative for environmental allergies.  Psychiatric/Behavioral:  Negative for depression. The patient is not nervous/anxious.        Objective:    Physical Exam Constitutional:      General: She is not in acute distress.    Appearance: Normal appearance. She is well-developed. She is ill-appearing. She is not toxic-appearing.  HENT:     Head: Normocephalic and atraumatic.     Right Ear: External ear normal.     Left Ear: External ear normal.     Nose: Nose normal.  Eyes:     General:        Right eye: No discharge.        Left eye: No discharge.     Conjunctiva/sclera: Conjunctivae normal.  Neck:     Thyroid: No thyromegaly.  Cardiovascular:     Rate and Rhythm: Normal rate and regular rhythm.     Heart sounds: Normal heart sounds. No murmur heard. Pulmonary:     Effort: Pulmonary effort is normal. No respiratory distress.     Breath sounds: Normal breath sounds.  Abdominal:     General: Bowel sounds are normal.     Palpations: Abdomen is soft.     Tenderness: There is no abdominal tenderness. There is no guarding.  Musculoskeletal:        General: Normal range of motion.     Cervical back: Neck supple.  Lymphadenopathy:     Cervical: No cervical adenopathy.  Skin:  General: Skin is warm and dry.  Neurological:     Mental Status: She is alert and oriented to person, place, and time.  Psychiatric:        Mood and Affect: Mood normal.        Behavior: Behavior normal.        Thought Content: Thought content normal.         Judgment: Judgment normal.     BP 120/78 (BP Location: Left Arm, Patient Position: Sitting, Cuff Size: Normal)   Pulse 78   Temp 97.9 F (36.6 C) (Oral)   Resp 16   Ht 5\' 1"  (1.549 m)   Wt 102 lb (46.3 kg)   SpO2 95%   BMI 19.27 kg/m  Wt Readings from Last 3 Encounters:  03/09/23 102 lb (46.3 kg)  02/09/23 103 lb 3.2 oz (46.8 kg)  12/26/22 105 lb (47.6 kg)    Diabetic Foot Exam - Simple   No data filed    Lab Results  Component Value Date   WBC 5.4 03/09/2023   HGB 11.2 (L) 03/09/2023   HCT 35.9 (L) 03/09/2023   PLT 203.0 Repeated and verified X2. 03/09/2023   GLUCOSE 80 03/09/2023   CHOL 180 03/09/2023   TRIG 97.0 03/09/2023   HDL 96.70 03/09/2023   LDLCALC 64 03/09/2023   ALT 10 03/09/2023   AST 16 03/09/2023   NA 140 03/09/2023   K 4.0 03/09/2023   CL 106 03/09/2023   CREATININE 0.74 03/09/2023   BUN 16 03/09/2023   CO2 25 03/09/2023   TSH 0.68 03/09/2023   INR 1.0 11/25/2022   HGBA1C 6.7 (H) 03/09/2023   MICROALBUR <0.7 03/09/2023    Lab Results  Component Value Date   TSH 0.68 03/09/2023   Lab Results  Component Value Date   WBC 5.4 03/09/2023   HGB 11.2 (L) 03/09/2023   HCT 35.9 (L) 03/09/2023   MCV 73.9 (L) 03/09/2023   PLT 203.0 Repeated and verified X2. 03/09/2023   Lab Results  Component Value Date   NA 140 03/09/2023   K 4.0 03/09/2023   CO2 25 03/09/2023   GLUCOSE 80 03/09/2023   BUN 16 03/09/2023   CREATININE 0.74 03/09/2023   BILITOT 0.3 03/09/2023   ALKPHOS 54 03/09/2023   AST 16 03/09/2023   ALT 10 03/09/2023   PROT 7.8 03/09/2023   ALBUMIN 4.2 03/09/2023   CALCIUM 9.5 03/09/2023   ANIONGAP 7 02/09/2023   GFR 85.46 03/09/2023   Lab Results  Component Value Date   CHOL 180 03/09/2023   Lab Results  Component Value Date   HDL 96.70 03/09/2023   Lab Results  Component Value Date   LDLCALC 64 03/09/2023   Lab Results  Component Value Date   TRIG 97.0 03/09/2023   Lab Results  Component Value Date   CHOLHDL 2  03/09/2023   Lab Results  Component Value Date   HGBA1C 6.7 (H) 03/09/2023       Assessment & Plan:  Osteoporosis, unspecified osteoporosis type, unspecified pathological fracture presence Assessment & Plan: Encouraged to get adequate exercise, calcium and vitamin d intake  Orders: -     TSH -     VITAMIN D 25 Hydroxy (Vit-D Deficiency, Fractures) -     Parathyroid hormone, intact (no Ca)  Tachycardia Assessment & Plan: RRR  Orders: -     TSH -     Microalbumin / creatinine urine ratio  Controlled type 2 diabetes mellitus without complication, without long-term current  use of insulin (HCC) Assessment & Plan: hgba1c acceptable, minimize simple carbs. Increase exercise as tolerated. Continue current meds  Orders: -     Comprehensive metabolic panel -     Lipid panel -     TSH -     Hemoglobin A1c  Chronic cough Assessment & Plan: Continues to struggle with cough  Orders: -     TSH -     DG Chest 2 View; Future -     HYDROcodone Bit-Homatrop MBr; Take 5 mLs by mouth every 6 (six) hours as needed for cough.  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 Noted right leg weakness, particularly with stairs. MRI of right hip performed by orthopedic specialist, results not available at this time. -Request orthopedic specialist's notes and MRI results. -Refer to social work team for assistance with transportation and other needs. -Order intrinsic factor to determine cause of low B12 and guide future treatment. -Encourage protein intake every 4 hours to support muscle  strength.  Persistent Cough Cough has returned since pneumonia hospitalization. Patient has been unable to perform breathing treatments due to fatigue. -Order chest x-ray to evaluate for new pneumonia or other lung pathology. -Prescribe Hydromet cough syrup and send to [facility] pharmacy. -If cough persists and chest x-ray is unremarkable, recommend patient contact pulmonology for possible bronchoscopy.  B12 Deficiency B12 levels have been fluctuating, currently low. Patient has received two B12 shots so far. -Continue B12 shots for now -Check B12 levels in a couple of months.  General Health Maintenance -Refer to GI for overdue colonoscopy. -Encourage patient to schedule physical therapy appointment when able. -Check A1C and Vitamin D levels.         Danise Edge, MD

## 2023-03-14 LAB — INTRINSIC FACTOR ANTIBODIES: Intrinsic Factor: POSITIVE — AB

## 2023-03-14 LAB — PARATHYROID HORMONE, INTACT (NO CA): PTH: 92 pg/mL — ABNORMAL HIGH (ref 16–77)

## 2023-03-15 ENCOUNTER — Other Ambulatory Visit: Payer: Self-pay | Admitting: Family Medicine

## 2023-03-15 ENCOUNTER — Ambulatory Visit: Payer: BC Managed Care – PPO

## 2023-03-15 DIAGNOSIS — E538 Deficiency of other specified B group vitamins: Secondary | ICD-10-CM | POA: Diagnosis not present

## 2023-03-15 NOTE — Progress Notes (Addendum)
Per orders of [name], MD, injection of B12 given in left deltoid by Breshae Belcher D Tauren Delbuono. Patient tolerated injection well.  Lab Results  Component Value Date   VITAMINB12 170 (L) 02/09/2023

## 2023-03-17 ENCOUNTER — Telehealth: Payer: Self-pay | Admitting: Family Medicine

## 2023-03-17 NOTE — Telephone Encounter (Signed)
I scheduled pt a B12 shot at our office with pt, not sure if she still has orders to get more.

## 2023-03-23 ENCOUNTER — Ambulatory Visit: Payer: BC Managed Care – PPO

## 2023-03-23 NOTE — Telephone Encounter (Signed)
Pt has future orders for B12. Ok for NV to be scheduled.

## 2023-03-23 NOTE — Telephone Encounter (Signed)
She cancelled her 10/17 and has rescheduled for 03/28/23.

## 2023-03-28 ENCOUNTER — Ambulatory Visit: Payer: BC Managed Care – PPO

## 2023-03-30 ENCOUNTER — Ambulatory Visit (INDEPENDENT_AMBULATORY_CARE_PROVIDER_SITE_OTHER): Payer: BC Managed Care – PPO

## 2023-03-30 DIAGNOSIS — E538 Deficiency of other specified B group vitamins: Secondary | ICD-10-CM | POA: Diagnosis not present

## 2023-03-30 MED ORDER — CYANOCOBALAMIN 1000 MCG/ML IJ SOLN
1000.0000 ug | Freq: Once | INTRAMUSCULAR | Status: AC
Start: 2023-03-30 — End: 2023-04-05
  Administered 2023-04-05: 1000 ug via INTRAMUSCULAR

## 2023-03-30 NOTE — Progress Notes (Signed)
Jamie Cox is a 62 y.o. female presents to the office today for 3/4 B12 injection?, per physician's orders. Original order: 02/09/23: "Labs stable no new concerns, no changes except vit b12 is low Notify Vitamin B12 is low. Start vitamin B12 shots, 1000 mcg shots IM weekly x 4 doses then biweekly x 4 doses then monthly. Recheck Vitamin B12 level again in roughly 12 weeks. " Cyanocobalamin 1000 mg/ml IM was administered R deltoid today. Patient tolerated injection. Patient due for follow up labs/provider appt: Yes. Date due:  appt made No apt is pending already Patient next injection due: 1 week for 4/4 weekly B12 injection, appt made

## 2023-04-02 ENCOUNTER — Other Ambulatory Visit: Payer: Self-pay | Admitting: Family Medicine

## 2023-04-05 ENCOUNTER — Ambulatory Visit (INDEPENDENT_AMBULATORY_CARE_PROVIDER_SITE_OTHER): Payer: BC Managed Care – PPO | Admitting: Podiatry

## 2023-04-05 ENCOUNTER — Encounter: Payer: Self-pay | Admitting: Podiatry

## 2023-04-05 DIAGNOSIS — E119 Type 2 diabetes mellitus without complications: Secondary | ICD-10-CM | POA: Diagnosis not present

## 2023-04-05 DIAGNOSIS — Q828 Other specified congenital malformations of skin: Secondary | ICD-10-CM

## 2023-04-05 DIAGNOSIS — B351 Tinea unguium: Secondary | ICD-10-CM

## 2023-04-05 DIAGNOSIS — E538 Deficiency of other specified B group vitamins: Secondary | ICD-10-CM

## 2023-04-05 DIAGNOSIS — L84 Corns and callosities: Secondary | ICD-10-CM

## 2023-04-05 DIAGNOSIS — M79676 Pain in unspecified toe(s): Secondary | ICD-10-CM | POA: Diagnosis not present

## 2023-04-06 ENCOUNTER — Ambulatory Visit (INDEPENDENT_AMBULATORY_CARE_PROVIDER_SITE_OTHER): Payer: BC Managed Care – PPO

## 2023-04-06 DIAGNOSIS — E538 Deficiency of other specified B group vitamins: Secondary | ICD-10-CM | POA: Diagnosis not present

## 2023-04-06 MED ORDER — CYANOCOBALAMIN 1000 MCG/ML IJ SOLN
1000.0000 ug | Freq: Once | INTRAMUSCULAR | Status: AC
Start: 2023-04-06 — End: 2023-04-06
  Administered 2023-04-06: 1000 ug via INTRAMUSCULAR

## 2023-04-06 NOTE — Progress Notes (Signed)
Jamie Cox is a 62 y.o. female presents to the office today for 4/4 B12 injection?, per physician's orders. Original order: 02/09/23: "Labs stable no new concerns, no changes except vit b12 is low Notify Vitamin B12 is low. Start vitamin B12 shots, 1000 mcg shots IM weekly x 4 doses then biweekly x 4 doses then monthly. Recheck Vitamin B12 level again in roughly 12 weeks. " Cyanocobalamin 1000 mg/ml IM was administered L deltoid today. Patient tolerated injection. Patient due for follow up labs/provider appt: Yes. Date due: appt made No apt is pending already Patient next injection due: Monthly B12 injection, appt made 05/03/2023

## 2023-04-08 NOTE — Progress Notes (Signed)
  Subjective:  Patient ID: Jamie Cox, female    DOB: Jan 16, 1961,  MRN: [id number]  62 y.o. female presents callus(es) b/l feet, porokeratotic lesion(s) b/l feet, and painful mycotic nails. Painful toenails interfere with ambulation. Aggravating factors include wearing enclosed shoe gear. Pain is relieved with periodic professional debridement. Painful callus(es) and porokeratotic lesion(s) are aggravated when weightbearing with and without shoegear. Pain is relieved with periodic professional debridement..  New problem(s): None   PCP is [name], MD , and last visit was March 09, 2023.  Allergies  Allergen Reactions   Avelox [Moxifloxacin Hcl In Nacl] Diarrhea, Nausea And Vomiting and Other (See Comments)    Dizziness, Cold chills    Bactrim [Sulfamethoxazole-Trimethoprim] Hives   Doxycycline     Abdominal discomfort, anorexia   Sulfamethoxazole-Trimethoprim Hives   Amoxicillin Other (See Comments)   Augmentin [Amoxicillin-Pot Clavulanate] Hives, Itching and Swelling    Review of Systems: Negative except as noted in the HPI.   Objective:  Jamie Cox is a pleasant 62 y.o. female WD, WN in NAD.Marland Kitchen AAO x 3.  Vascular Examination: Vascular status intact b/l with palpable pedal pulses. CFT immediate b/l. Pedal hair present. No edema. No pain with calf compression b/l. Skin temperature gradient WNL b/l. No varicosities noted. No cyanosis or clubbing noted.  Neurological Examination: Sensation grossly intact b/l with 10 gram monofilament. Vibratory sensation intact b/l.  Dermatological:  Pedal skin with normal turgor, texture and tone bilaterally. No open wounds bilaterally. No interdigital macerations bilaterally. .  Toenails 1-5 b/l elongated, discolored, dystrophic, thickened, crumbly with subungual debris and tenderness to dorsal palpation.  Hyperkeratotic lesion(s) medial IPJ right great toe. No erythema, no edema, no drainage, no fluctuance noted.  Porokeratotic  lesion(s) submet head 3 b/l and submet head 5 left foot. No erythema, no edema, no drainage, no fluctuance.  Musculoskeletal:  Normal muscle strength 5/5 to all lower extremity muscle groups bilaterally. No pain crepitus or joint limitation noted with ROM b/l. Hallux valgus with bunion deformity noted b/l lower extremities. Hammertoes noted to the 2-5 bilaterally.  Radiographs: None  Last A1c:      Latest Ref Rng & Units 03/09/2023   12:40 PM 11/25/2022   12:11 AM  Hemoglobin A1C  Hemoglobin-A1c 4.6 - 6.5 % 6.7  5.9      Assessment:   1. Pain due to onychomycosis of toenail   2. Callus   3. Porokeratosis   4. Controlled type 2 diabetes mellitus without complication, without long-term current use of insulin (HCC)    Plan:  -Patient was evaluated today. All questions/concerns addressed on today's visit. -Continue foot and shoe inspections daily. Monitor blood glucose per PCP/Endocrinologist's recommendations. -Patient to continue soft, supportive shoe gear daily. -Toenails 1-5 b/l were debrided in length and girth with sterile nail nippers and dremel without iatrogenic bleeding.  -Callus(es) left great toe and right great toe pared utilizing sterile scalpel blade without complication or incident. Total number debrided =2. -Porokeratotic lesion(s) submet head 3 b/l and submet head 5 b/l pared and enucleated with sterile currette without incident. Total number of lesions debrided=4 Patient instructed to apply Neosporin to left foot submet head 5 callus once daily for one week. -Patient/POA to call should there be question/concern in the interim.  Return in about 3 months (around 07/06/2023).  Freddie Breech, DPM

## 2023-04-10 ENCOUNTER — Inpatient Hospital Stay: Payer: BC Managed Care – PPO | Admitting: Internal Medicine

## 2023-04-10 ENCOUNTER — Inpatient Hospital Stay: Payer: BC Managed Care – PPO | Attending: Internal Medicine

## 2023-04-10 ENCOUNTER — Other Ambulatory Visit: Payer: Self-pay

## 2023-04-10 VITALS — BP 123/80 | HR 98 | Temp 97.9°F | Resp 17 | Ht 61.0 in | Wt 103.5 lb

## 2023-04-10 DIAGNOSIS — D509 Iron deficiency anemia, unspecified: Secondary | ICD-10-CM | POA: Insufficient documentation

## 2023-04-10 DIAGNOSIS — M810 Age-related osteoporosis without current pathological fracture: Secondary | ICD-10-CM | POA: Insufficient documentation

## 2023-04-10 DIAGNOSIS — M069 Rheumatoid arthritis, unspecified: Secondary | ICD-10-CM | POA: Insufficient documentation

## 2023-04-10 DIAGNOSIS — Z79899 Other long term (current) drug therapy: Secondary | ICD-10-CM | POA: Insufficient documentation

## 2023-04-10 DIAGNOSIS — Z7951 Long term (current) use of inhaled steroids: Secondary | ICD-10-CM | POA: Diagnosis not present

## 2023-04-10 DIAGNOSIS — I10 Essential (primary) hypertension: Secondary | ICD-10-CM | POA: Insufficient documentation

## 2023-04-10 DIAGNOSIS — E538 Deficiency of other specified B group vitamins: Secondary | ICD-10-CM | POA: Diagnosis not present

## 2023-04-10 DIAGNOSIS — J45909 Unspecified asthma, uncomplicated: Secondary | ICD-10-CM | POA: Diagnosis not present

## 2023-04-10 DIAGNOSIS — K219 Gastro-esophageal reflux disease without esophagitis: Secondary | ICD-10-CM | POA: Diagnosis not present

## 2023-04-10 DIAGNOSIS — K922 Gastrointestinal hemorrhage, unspecified: Secondary | ICD-10-CM | POA: Insufficient documentation

## 2023-04-10 DIAGNOSIS — J849 Interstitial pulmonary disease, unspecified: Secondary | ICD-10-CM | POA: Insufficient documentation

## 2023-04-10 DIAGNOSIS — D518 Other vitamin B12 deficiency anemias: Secondary | ICD-10-CM | POA: Diagnosis not present

## 2023-04-10 DIAGNOSIS — Z7952 Long term (current) use of systemic steroids: Secondary | ICD-10-CM | POA: Insufficient documentation

## 2023-04-10 DIAGNOSIS — R5383 Other fatigue: Secondary | ICD-10-CM | POA: Diagnosis not present

## 2023-04-10 LAB — CBC WITH DIFFERENTIAL (CANCER CENTER ONLY)
Abs Immature Granulocytes: 0.02 10*3/uL (ref 0.00–0.07)
Basophils Absolute: 0 10*3/uL (ref 0.0–0.1)
Basophils Relative: 1 %
Eosinophils Absolute: 0 10*3/uL (ref 0.0–0.5)
Eosinophils Relative: 1 %
HCT: 35.7 % — ABNORMAL LOW (ref 36.0–46.0)
Hemoglobin: 11.4 g/dL — ABNORMAL LOW (ref 12.0–15.0)
Immature Granulocytes: 0 %
Lymphocytes Relative: 15 %
Lymphs Abs: 1 10*3/uL (ref 0.7–4.0)
MCH: 23.3 pg — ABNORMAL LOW (ref 26.0–34.0)
MCHC: 31.9 g/dL (ref 30.0–36.0)
MCV: 73 fL — ABNORMAL LOW (ref 80.0–100.0)
Monocytes Absolute: 0.3 10*3/uL (ref 0.1–1.0)
Monocytes Relative: 5 %
Neutro Abs: 4.9 10*3/uL (ref 1.7–7.7)
Neutrophils Relative %: 78 %
Platelet Count: 114 10*3/uL — ABNORMAL LOW (ref 150–400)
RBC: 4.89 MIL/uL (ref 3.87–5.11)
RDW: 15 % (ref 11.5–15.5)
Smear Review: NORMAL
WBC Count: 6.3 10*3/uL (ref 4.0–10.5)
nRBC: 0 % (ref 0.0–0.2)

## 2023-04-10 LAB — IRON AND IRON BINDING CAPACITY (CC-WL,HP ONLY)
Iron: 160 ug/dL (ref 28–170)
Saturation Ratios: 56 % — ABNORMAL HIGH (ref 10.4–31.8)
TIBC: 287 ug/dL (ref 250–450)
UIBC: 127 ug/dL — ABNORMAL LOW (ref 148–442)

## 2023-04-10 LAB — FOLATE: Folate: 22.4 ng/mL (ref >5.9)

## 2023-04-10 LAB — VITAMIN B12: Vitamin B-12: 1400 pg/mL — ABNORMAL HIGH (ref 180–914)

## 2023-04-10 LAB — FERRITIN: Ferritin: 828 ng/mL — ABNORMAL HIGH (ref 11–307)

## 2023-04-10 NOTE — Progress Notes (Signed)
[facility] [phone]   [phone]  OFFICE PROGRESS NOTE  [name], MD [address] [place] [address]  DIAGNOSIS: microcytic anemia likely from iron deficiency and probably from a gastrointestinal blood loss.   PRIOR THERAPY: None  CURRENT THERAPY: Oral iron tablet with vitamin C in addition to folic acid and vitamin B12 supplements  INTERVAL HISTORY: Jamie Cox 62 y.o. female returns to the clinic today for follow-up visit.Discussed the use of AI scribe software for clinical note transcription with the patient, who gave verbal consent to proceed.  History of Present Illness   Jamie Cox, a 62 year old patient with a history of anemia, asthma, and interstitial lung disease, was seen for follow-up of anemia. A few months prior, the patient was diagnosed with iron deficiency anemia and was started on iron tablets, vitamin B12, and folic acid. The patient reported a slight improvement in energy levels since starting these supplements. 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  In addition to anemia, the patient has been dealing with a persistent cough, which she manages with daily breathing treatments. The patient has a history of asthma and interstitial lung disease, which is currently being treated by a pulmonologist. The patient reported difficulty in expelling mucus and takes Mucinex daily to manage this. The patient is on multiple medications for her lung condition, including Symbicort, Ventolin inhaler, prednisone, Singulair, and a cough medicine containing pseudoephedrine with guanificin.  The patient also reported fatigue, which has improved since starting the vitamin B12 injections. There were no reports of dizzy spells. The patient has been referred to a neurologist due to heaviness in the right leg. The patient uses home  breathing treatments but does not require home oxygen.       MEDICAL HISTORY: Past Medical History:  Diagnosis Date   Allergy    Asthma    Atypical chest pain    Chronic sinusitis    Followed by Dr. Jenne Pane   Environmental allergies    GERD (gastroesophageal reflux disease)    High serum parathyroid hormone (PTH) 06/09/2016   History of chicken pox    Hypercalcemia 07/19/2015   Hypertension    Osteoporosis, unspecified    steroid induced   Personal history of other diseases of circulatory system    Rheumatoid arthritis(714.0)    Unspecified deficiency anemia    microcytic   Urticaria     ALLERGIES:  is allergic to avelox [moxifloxacin hcl in nacl], bactrim [sulfamethoxazole-trimethoprim], doxycycline, sulfamethoxazole-trimethoprim, amoxicillin, and augmentin [amoxicillin-pot clavulanate].  MEDICATIONS:  Current Outpatient Medications  Medication Sig Dispense Refill   albuterol (VENTOLIN HFA) 108 (90 Base) MCG/ACT inhaler INHALE ONE TO TWO PUFFS INTO THE LUNGS EVERY 6 HOURS AS NEEDED FOR WHEEZING OR SHORTNESS OF BREATH (Patient taking differently: Inhale 1-2 puffs into the lungs every 6 (six) hours as needed for shortness of breath or wheezing. INHALE ONE TO TWO PUFFS INTO THE LUNGS EVERY 6 HOURS AS NEEDED FOR WHEEZING OR SHORTNESS OF BREATH) 18 each 5   atorvastatin (LIPITOR) 10 MG tablet Take 1 tablet (10 mg total) by mouth daily. 90 tablet 1   benzonatate (TESSALON) 100 MG capsule Take 1 capsule (100 mg total) by mouth 3 (three) times daily as needed. 20 capsule 0   budesonide-formoterol (SYMBICORT) 160-4.5 MCG/ACT inhaler Inhale 2 puffs into the lungs 2 (two) times daily. 10.2 g 12   calcium-vitamin  D (OSCAL WITH D) 500-5 MG-MCG tablet Take 1 tablet by mouth daily with breakfast.     famotidine (PEPCID) 40 MG tablet TAKE 1 TABLET BY MOUTH EVERY DAY 90 tablet 1   ferrous sulfate 325 (65 FE) MG tablet Take 1 tablet (325 mg total) by mouth 2 (two) times daily. 60 tablet 3    fluticasone (FLONASE) 50 MCG/ACT nasal spray Place 2 sprays into both nostrils daily. 16 g 5   gabapentin (NEURONTIN) 300 MG capsule TAKE 1 CAPSULE BY MOUTH THREE TIMES DAILY (Patient taking differently: Take 300 mg by mouth at bedtime.) 270 capsule 0   HEMATINIC/FOLIC ACID 324-1 MG TABS TAKE 1 TABLET BY MOUTH EVERY DAY 90 tablet 1   HYDROcodone bit-homatropine (HYDROMET) 5-1.5 MG/5ML syrup Take 5 mLs by mouth every 6 (six) hours as needed for cough. 120 mL 0   HYDROcodone-acetaminophen (NORCO/VICODIN) 5-325 MG tablet Take 1 tablet by mouth every 6 (six) hours as needed for moderate pain or severe pain. 40 tablet 0   levocetirizine (XYZAL) 5 MG tablet Take 1 tablet (5 mg total) by mouth every evening. 30 tablet 5   meloxicam (MOBIC) 7.5 MG tablet TAKE 1 TO 2 TABLETS BY MOUTH ONCE DAILY AS NEEDED FOR PAIN 30 tablet 0   metoprolol succinate (TOPROL-XL) 25 MG 24 hr tablet Take 1 tablet by mouth once daily 90 tablet 0   montelukast (SINGULAIR) 10 MG tablet TAKE 1 TABLET BY MOUTH AT BEDTIME 90 tablet 1   predniSONE (DELTASONE) 10 MG tablet Take 4 tablets daily for 2 days, 3 tablets daily for 2 days, 2 tablets daily for 2 days,1 tablet daily for 1 days, then resume your usual outpatient regimen of 2.5 mg daily. 19 tablet 0   predniSONE (DELTASONE) 2.5 MG tablet Take 1 tablet by mouth once daily with breakfast 30 tablet 0   Pseudoephedrine-Guaifenesin (MUCINEX D MAX STRENGTH) [phone] MG TB12 Take 1 tablet by mouth 2 (two) times daily.     No current facility-administered medications for this visit.    SURGICAL HISTORY:  Past Surgical History:  Procedure Laterality Date   NASAL SINUS SURGERY  06/2012   NASAL SINUS SURGERY     2010   OPEN REDUCTION INTERNAL FIXATION (ORIF) METACARPAL Right 07/13/2022   Procedure: OPEN REDUCTION INTERNAL FIXATION METACARPAL RIGHT SMALL FINGER;  Surgeon: Bradly Bienenstock, MD;  Location: [facility] OR;  Service: Orthopedics;  Laterality: Right;  regional with iv sedation    REVIEW  OF SYSTEMS:  A comprehensive review of systems was negative except for: Constitutional: positive for fatigue Respiratory: positive for cough and dyspnea on exertion   PHYSICAL EXAMINATION: General appearance: alert, cooperative, fatigued, and no distress Head: Normocephalic, without obvious abnormality, atraumatic Neck: no adenopathy, no JVD, supple, symmetrical, trachea midline, and thyroid not enlarged, symmetric, no tenderness/mass/nodules Lymph nodes: Cervical, supraclavicular, and axillary nodes normal. Resp: clear to auscultation bilaterally Back: symmetric, no curvature. ROM normal. No CVA tenderness. Cardio: regular rate and rhythm, S1, S2 normal, no murmur, click, rub or gallop GI: soft, non-tender; bowel sounds normal; no masses,  no organomegaly Extremities: extremities normal, atraumatic, no cyanosis or edema  ECOG PERFORMANCE STATUS: 1 - Symptomatic but completely ambulatory  Blood pressure 123/80, pulse 98, temperature 97.9 F (36.6 C), temperature source Oral, resp. rate 17, height 5\' 1"  (1.549 m), weight 103 lb 8 oz (46.9 kg), SpO2 100%.  LABORATORY DATA: Lab Results  Component Value Date   WBC 5.4 03/09/2023   HGB 11.2 (L) 03/09/2023   HCT 35.9 (  L) 03/09/2023   MCV 73.9 (L) 03/09/2023   PLT 203.0 Repeated and verified X2. 03/09/2023      Chemistry      Component Value Date/Time   NA 140 03/09/2023 1240   K 4.0 03/09/2023 1240   CL 106 03/09/2023 1240   CO2 25 03/09/2023 1240   BUN 16 03/09/2023 1240   CREATININE 0.74 03/09/2023 1240   CREATININE 0.83 02/09/2023 1136   CREATININE 0.71 03/31/2020 1108      Component Value Date/Time   CALCIUM 9.5 03/09/2023 1240   ALKPHOS 54 03/09/2023 1240   AST 16 03/09/2023 1240   AST 17 02/09/2023 1136   ALT 10 03/09/2023 1240   ALT 12 02/09/2023 1136   BILITOT 0.3 03/09/2023 1240   BILITOT 0.3 02/09/2023 1136       RADIOGRAPHIC STUDIES: No results found.  ASSESSMENT AND PLAN:    Iron Deficiency  Anemia Previously diagnosed with iron deficiency anemia and started on iron supplementation, folic acid, and vitamin B12. 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 Noted improvement in fatigue since starting treatment. -Continue iron supplementation, folic acid, and vitamin B12 injections. -Repeat CBC and iron studies in 3-4 months.  Interstitial Lung Disease Chronic cough and mucus production. Currently on Symbicort, Ventolin, prednisone 2.5mg , Singulair 10mg , and pseudoephedrine with guanificin. Also taking Mucinex for mucus production. -Continue current medications. -Check oxygen saturation.  Right Leg Heaviness Referred to neurologist by primary care physician for heaviness in right leg. -Follow up with neurologist as scheduled.   The patient was advised to call immediately if she has any concerning symptoms in the interval. The patient voices understanding of current disease status and treatment options and is in agreement with the current care plan.  All questions were answered. The patient knows to call the clinic with any problems, questions or concerns. We can certainly see the patient much sooner if necessary.  The total time spent in the appointment was 20 minutes.  Disclaimer: This note was dictated with voice recognition software. Similar sounding words can inadvertently be transcribed and may not be corrected upon review.       Follow-up visit.

## 2023-04-14 ENCOUNTER — Other Ambulatory Visit: Payer: Self-pay | Admitting: Family Medicine

## 2023-04-18 ENCOUNTER — Encounter: Payer: Self-pay | Admitting: Diagnostic Neuroimaging

## 2023-04-18 ENCOUNTER — Ambulatory Visit: Payer: BC Managed Care – PPO | Admitting: Diagnostic Neuroimaging

## 2023-04-18 VITALS — BP 120/78 | HR 80 | Ht 60.0 in | Wt 102.0 lb

## 2023-04-18 DIAGNOSIS — R29898 Other symptoms and signs involving the musculoskeletal system: Secondary | ICD-10-CM | POA: Diagnosis not present

## 2023-04-18 NOTE — Patient Instructions (Signed)
BILATERAL LEG WEAKNESS (right worse than left; since June 2024 pneumonia admission; now getting significantly better; could have been related to deconditioning in setting of pulmonary disease / infx, increased stress, decreased nutrition intake, B12 deficiency) - continue exercises and PT evaluation - continue B12 injections per PCP - follow up with rheumatology

## 2023-04-18 NOTE — Progress Notes (Signed)
[facility]  PATIENT: Jamie Cox DOB: 06-Sep-1960  REFERRING CLINICIAN: Jene Every, MD HISTORY FROM: patient REASON FOR VISIT: new consult   HISTORICAL  CHIEF COMPLAINT:  Chief Complaint  Patient presents with   New Patient (Initial Visit)    Rm 6, here alone Pt referred by Dr Jene Every from [facility] for heaviness on right leg and occasional numbness on right foot.     HISTORY OF PRESENT ILLNESS:   62 year old female here for evaluation of lower extremity weakness.  History of rheumatoid arthritis, on chronic prednisone.  June 2024 patient had several days of cough and shortness of breath, diagnosed with pneumonia and asthma exacerbation.  Patient was admitted and treated.  Patient had significant weakness following this.  She noted weakness in bilateral lower extremities, right worse than left side.  Over time the left leg is improved.  She still has some mild residual right-sided leg weakness.  No numbness or tingling.  Symptoms are improving over time.  Patient went to PCP and orthopedic clinic for evaluation.  MRI lumbar spine showed some mild degenerative changes but no cause for symptoms.    REVIEW OF SYSTEMS: Full 14 system review of systems performed and negative with exception of: as per HPI.  ALLERGIES: Allergies  Allergen Reactions   Avelox [Moxifloxacin Hcl In Nacl] Diarrhea, Nausea And Vomiting and Other (See Comments)    Dizziness, Cold chills    Bactrim [Sulfamethoxazole-Trimethoprim] Hives   Doxycycline     Abdominal discomfort, anorexia   Sulfamethoxazole-Trimethoprim Hives   Amoxicillin Other (See Comments)   Augmentin [Amoxicillin-Pot Clavulanate] Hives, Itching and Swelling    HOME MEDICATIONS: Outpatient Medications Prior to Visit  Medication Sig Dispense Refill   albuterol (VENTOLIN HFA) 108 (90 Base) MCG/ACT inhaler INHALE ONE TO TWO PUFFS INTO THE LUNGS EVERY 6 HOURS AS NEEDED FOR WHEEZING OR SHORTNESS OF  BREATH (Patient taking differently: Inhale 1-2 puffs into the lungs every 6 (six) hours as needed for shortness of breath or wheezing. INHALE ONE TO TWO PUFFS INTO THE LUNGS EVERY 6 HOURS AS NEEDED FOR WHEEZING OR SHORTNESS OF BREATH) 18 each 5   atorvastatin (LIPITOR) 10 MG tablet Take 1 tablet (10 mg total) by mouth daily. 90 tablet 1   budesonide-formoterol (SYMBICORT) 160-4.5 MCG/ACT inhaler Inhale 2 puffs into the lungs 2 (two) times daily. 10.2 g 12   calcium-vitamin D (OSCAL WITH D) 500-5 MG-MCG tablet Take 1 tablet by mouth daily with breakfast.     cefpodoxime (VANTIN) 200 MG tablet Take 200 mg by mouth daily.     famotidine (PEPCID) 40 MG tablet TAKE 1 TABLET BY MOUTH EVERY DAY 90 tablet 1   ferrous sulfate 325 (65 FE) MG tablet Take 1 tablet (325 mg total) by mouth 2 (two) times daily. 60 tablet 3   fluticasone (FLONASE) 50 MCG/ACT nasal spray Place 2 sprays into both nostrils daily. 16 g 5   gabapentin (NEURONTIN) 300 MG capsule TAKE 1 CAPSULE BY MOUTH THREE TIMES DAILY (Patient taking differently: Take 300 mg by mouth at bedtime.) 270 capsule 0   HEMATINIC/FOLIC ACID 324-1 MG TABS TAKE 1 TABLET BY MOUTH EVERY DAY 90 tablet 1   HYDROcodone-acetaminophen (NORCO/VICODIN) 5-325 MG tablet Take 1 tablet by mouth every 6 (six) hours as needed for moderate pain or severe pain. 40 tablet 0   levocetirizine (XYZAL) 5 MG tablet Take 1 tablet (5 mg total) by mouth every evening. 30 tablet 5   meloxicam (MOBIC) 7.5 MG tablet TAKE  1 TO 2 TABLETS BY MOUTH ONCE DAILY AS NEEDED FOR PAIN 30 tablet 0   metoprolol succinate (TOPROL-XL) 25 MG 24 hr tablet Take 1 tablet by mouth once daily 90 tablet 0   montelukast (SINGULAIR) 10 MG tablet TAKE 1 TABLET BY MOUTH AT BEDTIME 90 tablet 1   predniSONE (DELTASONE) 2.5 MG tablet Take 1 tablet by mouth once daily with breakfast 90 tablet 0   Pseudoephedrine-Guaifenesin (MUCINEX D MAX STRENGTH) [phone] MG TB12 Take 1 tablet by mouth 2 (two) times daily.      HYDROcodone bit-homatropine (HYDROMET) 5-1.5 MG/5ML syrup Take 5 mLs by mouth every 6 (six) hours as needed for cough. 120 mL 0   benzonatate (TESSALON) 100 MG capsule Take 1 capsule (100 mg total) by mouth 3 (three) times daily as needed. (Patient not taking: Reported on 04/18/2023) 20 capsule 0   predniSONE (DELTASONE) 10 MG tablet Take 4 tablets daily for 2 days, 3 tablets daily for 2 days, 2 tablets daily for 2 days,1 tablet daily for 1 days, then resume your usual outpatient regimen of 2.5 mg daily. (Patient not taking: Reported on 04/18/2023) 19 tablet 0   No facility-administered medications prior to visit.    PAST MEDICAL HISTORY: Past Medical History:  Diagnosis Date   Allergy    Asthma    Atypical chest pain    Chronic sinusitis    Followed by Dr. Jenne Pane   Environmental allergies    GERD (gastroesophageal reflux disease)    High serum parathyroid hormone (PTH) 06/09/2016   History of chicken pox    Hypercalcemia 07/19/2015   Hypertension    Osteoporosis, unspecified    steroid induced   Personal history of other diseases of circulatory system    Rheumatoid arthritis(714.0)    Unspecified deficiency anemia    microcytic   Urticaria     PAST SURGICAL HISTORY: Past Surgical History:  Procedure Laterality Date   NASAL SINUS SURGERY  06/2012   NASAL SINUS SURGERY     2010   OPEN REDUCTION INTERNAL FIXATION (ORIF) METACARPAL Right 07/13/2022   Procedure: OPEN REDUCTION INTERNAL FIXATION METACARPAL RIGHT SMALL FINGER;  Surgeon: Bradly Bienenstock, MD;  Location: [facility] OR;  Service: Orthopedics;  Laterality: Right;  regional with iv sedation    FAMILY HISTORY: Family History  Problem Relation Age of Onset   Liver cancer Maternal Grandmother    Hypertension Maternal Grandmother    Heart attack Maternal Grandmother    Scleroderma Mother    Heart disease Mother    Kidney disease Mother    Other Mother    Prostate cancer Father    Diabetes Father    Heart disease Father         CHF   Arthritis Father        s/p hip replacement   Cancer Father        lung cancer with mets, smoker   Lung cancer Paternal Uncle    Coronary artery disease Paternal Grandmother    Hypertension Paternal Grandmother    Liver cancer Paternal Grandmother    Hyperlipidemia Son    Alzheimer's disease Paternal Grandfather    Other Sister        Cardiomegaly   Arthritis Sister    Heart disease Sister        rare   Sleep apnea Brother    Heart disease Brother    Arthritis Daughter    Lupus Cousin    Arthritis Cousin    Colon cancer Neg Hx  Asthma Neg Hx    Allergic rhinitis Neg Hx    Eczema Neg Hx    Immunodeficiency Neg Hx    Angioedema Neg Hx     SOCIAL HISTORY: Social History   Socioeconomic History   Marital status: Married    Spouse name: Not on file   Number of children: 2   Years of education: Not on file   Highest education level: Not on file  Occupational History   Occupation: Data processing manager  Tobacco Use   Smoking status: Never   Smokeless tobacco: Never  Vaping Use   Vaping status: Never Used  Substance and Sexual Activity   Alcohol use: No    Alcohol/week: 0.0 standard drinks of alcohol   Drug use: No   Sexual activity: Not Currently  Other Topics Concern   Not on file  Social History Narrative   Not on file   Social Determinants of Health   Financial Resource Strain: Not on file  Food Insecurity: Not on file  Transportation Needs: No Transportation Needs (03/10/2023)   PRAPARE - Administrator, Civil Service (Medical): No    Lack of Transportation (Non-Medical): No  Physical Activity: Not on file  Stress: Not on file  Social Connections: Not on file  Intimate Partner Violence: Not on file     PHYSICAL EXAM  GENERAL EXAM/CONSTITUTIONAL: Vitals:  Vitals:   04/18/23 1048  BP: 120/78  Pulse: 80  Weight: 102 lb (46.3 kg)  Height: 5' (1.524 m)   Body mass index is 19.92 kg/m. Wt Readings from Last 3 Encounters:   04/18/23 102 lb (46.3 kg)  04/10/23 103 lb 8 oz (46.9 kg)  03/09/23 102 lb (46.3 kg)   Patient is in no distress; well developed, nourished and groomed; neck is supple  CARDIOVASCULAR: Examination of carotid arteries is normal; no carotid bruits Regular rate and rhythm, no murmurs Examination of peripheral vascular system by observation and palpation is normal  EYES: Ophthalmoscopic exam of optic discs and posterior segments is normal; no papilledema or hemorrhages BILATERAL PROPTOSIS No results found.  MUSCULOSKELETAL: Gait, strength, tone, movements noted in Neurologic exam below  NEUROLOGIC: MENTAL STATUS:      No data to display         awake, alert, oriented to person, place and time recent and remote memory intact normal attention and concentration language fluent, comprehension intact, naming intact fund of knowledge appropriate  CRANIAL NERVE:  2nd - no papilledema on fundoscopic exam 2nd, 3rd, 4th, 6th - pupils equal and reactive to light, visual fields full to confrontation, extraocular muscles intact, no nystagmus 5th - facial sensation symmetric 7th - facial strength symmetric 8th - hearing intact 9th - palate elevates symmetrically, uvula midline 11th - shoulder shrug symmetric 12th - tongue protrusion midline  MOTOR:  normal bulk and tone, full strength in the BUE, BLE; SLIGHTLY LIMITED IN RIGHT HIP FLEXION 4+/5  SENSORY:  normal and symmetric to light touch, temperature, vibration  COORDINATION:  finger-nose-finger, fine finger movements normal  REFLEXES:  deep tendon reflexes 1+ and symmetric  GAIT/STATION:  narrow based gait     DIAGNOSTIC DATA (LABS, IMAGING, TESTING) - I reviewed patient records, labs, notes, testing and imaging myself where available.  Lab Results  Component Value Date   WBC 6.3 04/10/2023   HGB 11.4 (L) 04/10/2023   HCT 35.7 (L) 04/10/2023   MCV 73.0 (L) 04/10/2023   PLT 114 (L) 04/10/2023      Component  Value Date/Time  NA 140 03/09/2023 1240   K 4.0 03/09/2023 1240   CL 106 03/09/2023 1240   CO2 25 03/09/2023 1240   GLUCOSE 80 03/09/2023 1240   BUN 16 03/09/2023 1240   CREATININE 0.74 03/09/2023 1240   CREATININE 0.83 02/09/2023 1136   CREATININE 0.71 03/31/2020 1108   CALCIUM 9.5 03/09/2023 1240   PROT 7.8 03/09/2023 1240   ALBUMIN 4.2 03/09/2023 1240   AST 16 03/09/2023 1240   AST 17 02/09/2023 1136   ALT 10 03/09/2023 1240   ALT 12 02/09/2023 1136   ALKPHOS 54 03/09/2023 1240   BILITOT 0.3 03/09/2023 1240   BILITOT 0.3 02/09/2023 1136   GFRNONAA >60 02/09/2023 1136   GFRNONAA >89 07/22/2013 1155   GFRAA 37 (L) 09/21/2019 0330   GFRAA >89 07/22/2013 1155   Lab Results  Component Value Date   CHOL 180 03/09/2023   HDL 96.70 03/09/2023   LDLCALC 64 03/09/2023   TRIG 97.0 03/09/2023   CHOLHDL 2 03/09/2023   Lab Results  Component Value Date   HGBA1C 6.7 (H) 03/09/2023   Lab Results  Component Value Date   VITAMINB12 1,400 (H) 04/10/2023   Lab Results  Component Value Date   TSH 0.68 03/09/2023    01/02/23 MRI lumbar spine [report only; summarized below] - mild degenerative changes; L4-5, L5-S1; no spinal stenosis or foraminal narrowing    ASSESSMENT AND PLAN  62 y.o. year old female here with:   Dx:  1. Leg weakness, bilateral    \  PLAN:  BILATERAL LEG WEAKNESS (right worse than left; since June 2024 pneumonia admission; now getting significantly better; could have been related to deconditioning in setting of pulmonary disease / infx, increased stress, decreased nutrition intake, B12 deficiency) - continue exercises and PT evaluation - continue B12 injections per PCP - follow up with rheumatology  Return for pending if symptoms worsen or fail to improve, return to PCP.    Suanne Marker, MD 04/18/2023, 11:56 AM Certified in Neurology, Neurophysiology and Neuroimaging  [facility] [address], [address] [place], [place] [address] [phone]

## 2023-04-24 ENCOUNTER — Telehealth: Payer: Self-pay | Admitting: Family Medicine

## 2023-04-24 ENCOUNTER — Other Ambulatory Visit: Payer: BC Managed Care – PPO

## 2023-04-24 ENCOUNTER — Other Ambulatory Visit: Payer: Self-pay | Admitting: Family Medicine

## 2023-04-24 ENCOUNTER — Telehealth: Payer: Self-pay | Admitting: *Deleted

## 2023-04-24 DIAGNOSIS — R7989 Other specified abnormal findings of blood chemistry: Secondary | ICD-10-CM

## 2023-04-24 MED ORDER — AZITHROMYCIN 250 MG PO TABS: ORAL_TABLET | ORAL | 0 refills | Status: AC

## 2023-04-24 NOTE — Telephone Encounter (Signed)
Pt came in for lab appt for B12 recheck however just had b12 checked on 11/4 and i did not see a clear return date for b12 recheck in her last lab result note. Per pt, she had already completed all 4 weekly injections (so she hasn't reduced the dose from the original order) and plans to proceed with monthly injections on 11/27.  I wasn't sure if her level would still be high and wasn't sure how we should proceed? Please advise?

## 2023-04-24 NOTE — Telephone Encounter (Signed)
Pt here for lab appt but she said she is still coughing and her ears are not better and she wants to know if something can be called in (refill of medicine from before or something else) or should she come back in to be reevaluated. Please call to advise

## 2023-04-24 NOTE — Telephone Encounter (Signed)
Lab was not drawn today. Pt stated she would be returning on 05/03/23 for monthly B12 injection and would do lab before that visit.  Is it still ok to proceed with first monthly b12 injection or should we cancel injection until after we get lab result back?   Per secure chat from Dr Abner Greenspan:  go ahead and repeat the vitamin B12 because she was high. also if possible add a prealbumin for h/o low prealbumin, thanks

## 2023-04-25 NOTE — Telephone Encounter (Signed)
Called patient. No answer. Left voicemail.

## 2023-04-25 NOTE — Telephone Encounter (Signed)
Notified pt of PCP's response and scheduled lab visit for this Friday at 10:15. Future order entered for prealbumin to be done with B12.

## 2023-04-26 NOTE — Telephone Encounter (Signed)
Followed up with patient and she was made aware prescription request has been filled.

## 2023-04-28 ENCOUNTER — Other Ambulatory Visit (INDEPENDENT_AMBULATORY_CARE_PROVIDER_SITE_OTHER): Payer: BC Managed Care – PPO

## 2023-04-28 DIAGNOSIS — R7989 Other specified abnormal findings of blood chemistry: Secondary | ICD-10-CM

## 2023-04-28 DIAGNOSIS — E538 Deficiency of other specified B group vitamins: Secondary | ICD-10-CM | POA: Diagnosis not present

## 2023-04-28 LAB — VITAMIN B12: Vitamin B-12: 406 pg/mL (ref 211–911)

## 2023-05-01 LAB — PREALBUMIN: Prealbumin: 22 mg/dL (ref 17–34)

## 2023-05-03 ENCOUNTER — Ambulatory Visit: Payer: BC Managed Care – PPO | Admitting: Physician Assistant

## 2023-05-03 ENCOUNTER — Encounter: Payer: Self-pay | Admitting: Family Medicine

## 2023-05-03 ENCOUNTER — Encounter: Payer: Self-pay | Admitting: Physician Assistant

## 2023-05-03 ENCOUNTER — Ambulatory Visit: Payer: BC Managed Care – PPO

## 2023-05-03 VITALS — BP 137/74 | HR 87 | Temp 98.1°F | Ht 60.0 in | Wt 105.0 lb

## 2023-05-03 DIAGNOSIS — E538 Deficiency of other specified B group vitamins: Secondary | ICD-10-CM

## 2023-05-03 DIAGNOSIS — J471 Bronchiectasis with (acute) exacerbation: Secondary | ICD-10-CM

## 2023-05-03 MED ORDER — PREDNISONE 10 MG PO TABS: ORAL_TABLET | ORAL | 0 refills | Status: AC

## 2023-05-03 MED ORDER — CYANOCOBALAMIN 1000 MCG/ML IJ SOLN
1000.0000 ug | Freq: Once | INTRAMUSCULAR | Status: AC
  Administered 2023-05-03: 1000 ug via INTRAMUSCULAR

## 2023-05-03 MED ORDER — CEFPODOXIME PROXETIL 200 MG PO TABS: 200.0000 mg | ORAL_TABLET | Freq: Two times a day (BID) | ORAL | 0 refills | Status: AC

## 2023-05-03 NOTE — Progress Notes (Signed)
Established patient visit   Patient: Jamie Cox   DOB: 1961-01-08   62 y.o. Female  MRN: [id number] Visit Date: 05/03/2023  Today's healthcare provider: Alfredia Ferguson, PA-C   Cc. Cough  Subjective    Pt has a history of bronchiectasis, pneumonia, hospitalized in June of this year. She reports a chronic cough, worsened in the last few weeks. Reports significant coughing spasms with wheezing. When she coughs she cannot catch her breath. Reports productive green/dark sputum.  She recently took azithromycin, last week, with no improvement. Overall feels achy and week today.  Medications: Outpatient Medications Prior to Visit  Medication Sig   albuterol (VENTOLIN HFA) 108 (90 Base) MCG/ACT inhaler INHALE ONE TO TWO PUFFS INTO THE LUNGS EVERY 6 HOURS AS NEEDED FOR WHEEZING OR SHORTNESS OF BREATH (Patient taking differently: Inhale 1-2 puffs into the lungs every 6 (six) hours as needed for shortness of breath or wheezing. INHALE ONE TO TWO PUFFS INTO THE LUNGS EVERY 6 HOURS AS NEEDED FOR WHEEZING OR SHORTNESS OF BREATH)   atorvastatin (LIPITOR) 10 MG tablet Take 1 tablet (10 mg total) by mouth daily.   benzonatate (TESSALON) 100 MG capsule Take 1 capsule (100 mg total) by mouth 3 (three) times daily as needed. (Patient not taking: Reported on 04/18/2023)   budesonide-formoterol (SYMBICORT) 160-4.5 MCG/ACT inhaler Inhale 2 puffs into the lungs 2 (two) times daily.   calcium-vitamin D (OSCAL WITH D) 500-5 MG-MCG tablet Take 1 tablet by mouth daily with breakfast.   cefpodoxime (VANTIN) 200 MG tablet Take 200 mg by mouth daily.   famotidine (PEPCID) 40 MG tablet TAKE 1 TABLET BY MOUTH EVERY DAY   ferrous sulfate 325 (65 FE) MG tablet Take 1 tablet (325 mg total) by mouth 2 (two) times daily.   fluticasone (FLONASE) 50 MCG/ACT nasal spray Place 2 sprays into both nostrils daily.   gabapentin (NEURONTIN) 300 MG capsule TAKE 1 CAPSULE BY MOUTH THREE TIMES DAILY (Patient taking  differently: Take 300 mg by mouth at bedtime.)   HEMATINIC/FOLIC ACID 324-1 MG TABS TAKE 1 TABLET BY MOUTH EVERY DAY   HYDROcodone-acetaminophen (NORCO/VICODIN) 5-325 MG tablet Take 1 tablet by mouth every 6 (six) hours as needed for moderate pain or severe pain.   levocetirizine (XYZAL) 5 MG tablet Take 1 tablet (5 mg total) by mouth every evening.   meloxicam (MOBIC) 7.5 MG tablet TAKE 1 TO 2 TABLETS BY MOUTH ONCE DAILY AS NEEDED FOR PAIN   metoprolol succinate (TOPROL-XL) 25 MG 24 hr tablet Take 1 tablet by mouth once daily   montelukast (SINGULAIR) 10 MG tablet TAKE 1 TABLET BY MOUTH AT BEDTIME   predniSONE (DELTASONE) 2.5 MG tablet Take 1 tablet by mouth once daily with breakfast   Pseudoephedrine-Guaifenesin (MUCINEX D MAX STRENGTH) [phone] MG TB12 Take 1 tablet by mouth 2 (two) times daily.   No facility-administered medications prior to visit.    Review of Systems  Constitutional:  Positive for fatigue. Negative for fever.  Respiratory:  Positive for cough, shortness of breath and wheezing.   Cardiovascular:  Negative for chest pain and leg swelling.  Gastrointestinal:  Negative for abdominal pain.  Neurological:  Negative for dizziness and headaches.        Objective    BP 137/74   Pulse 87   Temp 98.1 F (36.7 C)   Ht 5' (1.524 m)   Wt 105 lb (47.6 kg)   SpO2 97%   BMI 20.51 kg/m     Physical  Exam Constitutional:      General: She is awake.     Appearance: She is well-developed.  HENT:     Head: Normocephalic.  Eyes:     Conjunctiva/sclera: Conjunctivae normal.  Cardiovascular:     Rate and Rhythm: Normal rate and regular rhythm.     Heart sounds: Normal heart sounds.  Pulmonary:     Effort: Pulmonary effort is normal.     Breath sounds: Normal breath sounds. No wheezing, rhonchi or rales.     Comments: Severe barking cough ; decreased breath sounds at bases Skin:    General: Skin is warm.  Neurological:     Mental Status: She is alert and oriented to  person, place, and time.  Psychiatric:        Attention and Perception: Attention normal.        Mood and Affect: Mood normal.        Speech: Speech normal.        Behavior: Behavior is cooperative.     No results found for any visits on 05/03/23.  Assessment & Plan    Bronchiectasis with (acute) exacerbation (HCC) -cont inhalers and nebulized tx at home, pt declined tx in office. -given recent azithro use with no improvement, rx vantin bid x 7 days; coverage for cap -rx prednisone taper  If no improvement, recommend chest xray   -     Cefpodoxime Proxetil; Take 1 tablet (200 mg total) by mouth 2 (two) times daily for 7 days.  Dispense: 14 tablet; Refill: 0 -     predniSONE; Take 6 tablets (60 mg total) by mouth daily with breakfast for 1 day, THEN 5 tablets (50 mg total) daily with breakfast for 1 day, THEN 4 tablets (40 mg total) daily with breakfast for 1 day, THEN 3 tablets (30 mg total) daily with breakfast for 1 day, THEN 2 tablets (20 mg total) daily with breakfast for 1 day, THEN 1 tablet (10 mg total) daily with breakfast for 1 day.  Dispense: 21 tablet; Refill: 0  Vitamin B12 deficiency -pt was scheduled for vit b12 injection today, but missed d/t illness -     Cyanocobalamin  Return if symptoms worsen or fail to improve.      Alfredia Ferguson, PA-C  [facility] [phone] (phone) [phone] (fax)  [facility]

## 2023-05-03 NOTE — Telephone Encounter (Signed)
error 

## 2023-05-05 ENCOUNTER — Other Ambulatory Visit: Payer: Self-pay | Admitting: Family Medicine

## 2023-05-05 ENCOUNTER — Other Ambulatory Visit (HOSPITAL_COMMUNITY): Payer: Self-pay

## 2023-05-05 DIAGNOSIS — M79661 Pain in right lower leg: Secondary | ICD-10-CM

## 2023-05-09 ENCOUNTER — Other Ambulatory Visit: Payer: Self-pay | Admitting: Family Medicine

## 2023-05-10 ENCOUNTER — Ambulatory Visit: Payer: BC Managed Care – PPO

## 2023-05-24 ENCOUNTER — Telehealth: Payer: Self-pay | Admitting: *Deleted

## 2023-05-24 DIAGNOSIS — M810 Age-related osteoporosis without current pathological fracture: Secondary | ICD-10-CM

## 2023-05-24 MED ORDER — DENOSUMAB 60 MG/ML ~~LOC~~ SOSY: 60.0000 mg | PREFILLED_SYRINGE | Freq: Once | SUBCUTANEOUS | Status: DC

## 2023-05-24 NOTE — Telephone Encounter (Signed)
Prolia given 12/07/22 Next Prolia due 06/09/23  CAM order placed

## 2023-05-26 ENCOUNTER — Ambulatory Visit (INDEPENDENT_AMBULATORY_CARE_PROVIDER_SITE_OTHER): Payer: BC Managed Care – PPO

## 2023-05-26 DIAGNOSIS — E538 Deficiency of other specified B group vitamins: Secondary | ICD-10-CM | POA: Diagnosis not present

## 2023-05-26 MED ORDER — CYANOCOBALAMIN 1000 MCG/ML IJ SOLN
1000.0000 ug | Freq: Once | INTRAMUSCULAR | Status: AC
  Administered 2023-05-26: 1000 ug via INTRAMUSCULAR

## 2023-05-26 NOTE — Progress Notes (Signed)
Jamie Cox is a 62 y.o. female presents to the office today for Monthly B12 injection, per physician's orders. Original order: "06/05/2023: B12 levels have been fluctuating, currently low. Patient has received two B12 shots so far. Continue B12 shots for now. Check B12 levels in a couple of months." Cyanocobalamin 1000 mg/ml IM was administered R Deltoid today. Patient tolerated injection. Patient due for follow up labs/provider appt: No.  Patient next injection due: 1 month, appt made Yes   DOD: Alric Seton, Feliberto Harts

## 2023-06-02 NOTE — Telephone Encounter (Signed)
Checking on PA

## 2023-06-06 ENCOUNTER — Other Ambulatory Visit: Payer: Self-pay | Admitting: Family Medicine

## 2023-06-06 DIAGNOSIS — M79661 Pain in right lower leg: Secondary | ICD-10-CM

## 2023-06-08 ENCOUNTER — Other Ambulatory Visit: Payer: Self-pay

## 2023-06-08 DIAGNOSIS — M069 Rheumatoid arthritis, unspecified: Secondary | ICD-10-CM

## 2023-06-08 MED ORDER — HYDROCODONE-ACETAMINOPHEN 5-325 MG PO TABS: 1.0000 | ORAL_TABLET | Freq: Four times a day (QID) | ORAL | 0 refills | Status: DC | PRN

## 2023-06-08 NOTE — Telephone Encounter (Signed)
 Copied from CRM [id number]. Topic: Clinical - Medication Refill >> Jun 08, 2023  9:37 AM Corean SAUNDERS wrote: Most Recent Primary Care Visit:  Provider: CANDELARIA CROME CREFT  Department: [facility]  Visit Type: NURSE VISIT  Date: 05/26/2023  Medication: HYDROcodone -acetaminophen  (NORCO/VICODIN) 5-325 MG tablet  Has the patient contacted their pharmacy? Patient is out re-fills. (Agent: If no, request that the patient contact the pharmacy for the refill. If patient does not wish to contact the pharmacy document the reason why and proceed with request.) (Agent: If yes, when and what did the pharmacy advise?)  Is this the correct pharmacy for this prescription? Yes If no, delete pharmacy and type the correct one.  This is the patient's preferred pharmacy:  [id number] - [place] ([place]), [place] - [address] [place] ([place]) [place] [address] Phone: [phone] Fax: [phone]    Has the prescription been filled recently? Yes  Is the patient out of the medication? Yes  Has the patient been seen for an appointment in the last year OR does the patient have an upcoming appointment? Yes  Can we respond through MyChart? No  Agent: Please be advised that Rx refills may take up to 3 business days. We ask that you follow-up with your pharmacy.

## 2023-06-08 NOTE — Telephone Encounter (Signed)
Rx sent by Padonda.

## 2023-06-08 NOTE — Telephone Encounter (Signed)
 Requesting: hydrocodone 5-325mg   Contract: 12/07/22 UDS: 12/07/22 Last Visit: 05/03/23 w/ Lillia Abed Next Visit: 06/15/23 Last Refill: 02/28/23 #40 and 0RF   Please Advise

## 2023-06-14 NOTE — Assessment & Plan Note (Signed)
 Patient with long history of chronic cough.

## 2023-06-14 NOTE — Assessment & Plan Note (Signed)
 Follows with pulmonology

## 2023-06-14 NOTE — Assessment & Plan Note (Signed)
 Encouraged to get adequate exercise, calcium and vitamin d intake

## 2023-06-15 ENCOUNTER — Ambulatory Visit (INDEPENDENT_AMBULATORY_CARE_PROVIDER_SITE_OTHER): Payer: 59 | Admitting: Family Medicine

## 2023-06-15 ENCOUNTER — Encounter: Payer: Self-pay | Admitting: Family Medicine

## 2023-06-15 ENCOUNTER — Telehealth: Payer: Self-pay | Admitting: Emergency Medicine

## 2023-06-15 ENCOUNTER — Telehealth (HOSPITAL_BASED_OUTPATIENT_CLINIC_OR_DEPARTMENT_OTHER): Payer: Self-pay

## 2023-06-15 VITALS — BP 146/94 | HR 79 | Temp 97.7°F | Resp 18 | Ht 61.0 in | Wt 103.4 lb

## 2023-06-15 DIAGNOSIS — J328 Other chronic sinusitis: Secondary | ICD-10-CM

## 2023-06-15 DIAGNOSIS — M0510 Rheumatoid lung disease with rheumatoid arthritis of unspecified site: Secondary | ICD-10-CM | POA: Diagnosis not present

## 2023-06-15 DIAGNOSIS — R053 Chronic cough: Secondary | ICD-10-CM | POA: Diagnosis not present

## 2023-06-15 DIAGNOSIS — Z1231 Encounter for screening mammogram for malignant neoplasm of breast: Secondary | ICD-10-CM

## 2023-06-15 DIAGNOSIS — K625 Hemorrhage of anus and rectum: Secondary | ICD-10-CM | POA: Diagnosis not present

## 2023-06-15 DIAGNOSIS — J0140 Acute pansinusitis, unspecified: Secondary | ICD-10-CM

## 2023-06-15 DIAGNOSIS — M79661 Pain in right lower leg: Secondary | ICD-10-CM

## 2023-06-15 DIAGNOSIS — R000 Tachycardia, unspecified: Secondary | ICD-10-CM

## 2023-06-15 DIAGNOSIS — Z1211 Encounter for screening for malignant neoplasm of colon: Secondary | ICD-10-CM

## 2023-06-15 DIAGNOSIS — M810 Age-related osteoporosis without current pathological fracture: Secondary | ICD-10-CM | POA: Diagnosis not present

## 2023-06-15 DIAGNOSIS — D510 Vitamin B12 deficiency anemia due to intrinsic factor deficiency: Secondary | ICD-10-CM

## 2023-06-15 DIAGNOSIS — T7840XD Allergy, unspecified, subsequent encounter: Secondary | ICD-10-CM

## 2023-06-15 DIAGNOSIS — D509 Iron deficiency anemia, unspecified: Secondary | ICD-10-CM

## 2023-06-15 DIAGNOSIS — E559 Vitamin D deficiency, unspecified: Secondary | ICD-10-CM

## 2023-06-15 MED ORDER — FLUTICASONE PROPIONATE 50 MCG/ACT NA SUSP: 2.0000 | Freq: Every day | NASAL | 5 refills | Status: AC

## 2023-06-15 MED ORDER — GABAPENTIN 300 MG PO CAPS: 300.0000 mg | ORAL_CAPSULE | Freq: Two times a day (BID) | ORAL | 1 refills | Status: AC | PRN

## 2023-06-15 MED ORDER — MELOXICAM 7.5 MG PO TABS: ORAL_TABLET | ORAL | Status: DC

## 2023-06-15 MED ORDER — ALBUTEROL SULFATE HFA 108 (90 BASE) MCG/ACT IN AERS: INHALATION_SPRAY | RESPIRATORY_TRACT | 5 refills | Status: AC

## 2023-06-15 MED ORDER — HEMATINIC/FOLIC ACID 324-1 MG PO TABS: 1.0000 | ORAL_TABLET | Freq: Every day | ORAL | 1 refills | Status: DC

## 2023-06-15 NOTE — Telephone Encounter (Signed)
 Per Dr. Abner Greenspan................."patient should be getting prolia and I cannot tell if she got it yet"  Could you let us know please? Thank you!

## 2023-06-15 NOTE — Patient Instructions (Addendum)
 Annual COVID and flu booster  RSV, Respiratory Syncitial virus vaccine, Arexvy vaccine at pharmacy  Shingrix  is the new shingles shot, 2 shots over 2-6 months, confirm coverage with insurance and document, then can return here for shots with nurse appt or at pharmacy   Tetanus booster at pharmacy at [facility] or any pharmacy

## 2023-06-16 ENCOUNTER — Other Ambulatory Visit (HOSPITAL_COMMUNITY): Payer: Self-pay

## 2023-06-16 ENCOUNTER — Telehealth: Payer: Self-pay

## 2023-06-16 NOTE — Telephone Encounter (Signed)
Checking for update?

## 2023-06-16 NOTE — Telephone Encounter (Signed)
 Prolia VOB initiated via [email or web address]  Next Prolia inj DUE: NOW

## 2023-06-16 NOTE — Telephone Encounter (Signed)
 Still waiting on EOB.

## 2023-06-17 DIAGNOSIS — K625 Hemorrhage of anus and rectum: Secondary | ICD-10-CM | POA: Insufficient documentation

## 2023-06-17 DIAGNOSIS — E559 Vitamin D deficiency, unspecified: Secondary | ICD-10-CM | POA: Insufficient documentation

## 2023-06-17 DIAGNOSIS — D510 Vitamin B12 deficiency anemia due to intrinsic factor deficiency: Secondary | ICD-10-CM | POA: Insufficient documentation

## 2023-06-17 DIAGNOSIS — M79661 Pain in right lower leg: Secondary | ICD-10-CM | POA: Insufficient documentation

## 2023-06-17 NOTE — Assessment & Plan Note (Signed)
Refill on Flonase 

## 2023-06-17 NOTE — Assessment & Plan Note (Signed)
 Positive Intrinsic Factor maintain monthly Vitamin  B12 shots

## 2023-06-17 NOTE — Progress Notes (Signed)
 Subjective:    Patient ID: Jamie Cox, female    DOB: Aug 06, 1960, 63 y.o.   MRN: [id number]  Chief Complaint  Patient presents with  . Follow-up    3 month    HPI Discussed the use of AI scribe software for clinical note transcription with the patient, who gave verbal consent to proceed.  History of Present Illness   The patient, with a history of rheumatoid arthritis, osteoporosis, and respiratory issues, presents for a routine follow-up. She reports ongoing issues with joint pain and stiffness, which she manages with meloxicam  and gabapentin . The patient notes that she sometimes takes two doses of meloxicam  during flare-ups of her arthritis. She also reports taking gabapentin  primarily at bedtime, but occasionally during the day if needed.  The patient also discusses her osteoporosis treatment, noting that she previously took a weekly pill for a couple of years but is not currently on any specific medication for this condition. She mentions having a bone density scan two years ago, which showed osteoporosis.  In terms of gastrointestinal issues, the patient reports having approximately three bowel movements a day, which are generally formed and not hard or painful. However, she notes occasional blood on the tissue and stool, which she has noticed recently.  The patient also mentions respiratory issues, including a chronic cough that has improved since a recent course of antibiotics. She is currently taking albuterol  and a nasal spray for these symptoms.  The patient is also considering retirement and discusses the financial implications of this decision. She notes that she will be turning 63 in April.        Past Medical History:  Diagnosis Date  . Allergy   . Asthma   . Atypical chest pain   . Chronic sinusitis    Followed by Dr. Carlie  . Environmental allergies   . GERD (gastroesophageal reflux disease)   . 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 History of  chicken pox   . Hypercalcemia 07/19/2015  . Hypertension   . Osteoporosis, unspecified    steroid induced  . Personal history of other diseases of circulatory system   . Rheumatoid arthritis(714.0)   . Unspecified deficiency anemia    microcytic  . Urticaria     Past Surgical History:  Procedure Laterality Date  . NASAL SINUS SURGERY  06/2012  . NASAL SINUS SURGERY     2010  . OPEN REDUCTION INTERNAL FIXATION (ORIF) METACARPAL Right 07/13/2022   Procedure: OPEN REDUCTION INTERNAL FIXATION METACARPAL RIGHT SMALL FINGER;  Surgeon: Shari Easter, MD;  Location: [facility] OR;  Service: Orthopedics;  Laterality: Right;  regional with iv sedation    Family History  Problem Relation Age of Onset  . Liver cancer Maternal Grandmother   . Hypertension Maternal Grandmother   . Heart attack Maternal Grandmother   . Scleroderma Mother   . Heart disease Mother   . Kidney disease Mother   . Other Mother   . Prostate cancer Father   . Diabetes Father   . Heart disease Father        CHF  . Arthritis Father        s/p hip replacement  . Cancer Father        lung cancer with mets, smoker  . Lung cancer Paternal Uncle   . Coronary artery disease Paternal Grandmother   . Hypertension Paternal Grandmother   . Liver cancer Paternal Grandmother   . Hyperlipidemia Son   . Alzheimer's disease Paternal Grandfather   .  Other Sister        Cardiomegaly  . Arthritis Sister   . Heart disease Sister        rare  . Sleep apnea Brother   . Heart disease Brother   . Arthritis Daughter   . Lupus Cousin   . Arthritis Cousin   . Colon cancer Neg Hx   . Asthma Neg Hx   . Allergic rhinitis Neg Hx   . Eczema Neg Hx   . Immunodeficiency Neg Hx   . Angioedema Neg Hx     Social History   Socioeconomic History  . Marital status: Married    Spouse name: Not on file  . Number of children: 2  . Years of education: Not on file  . Highest education level: Not on file  Occupational History  . Occupation:  Data processing manager  Tobacco Use  . Smoking status: Never  . Smokeless tobacco: Never  Vaping Use  . Vaping status: Never Used  Substance and Sexual Activity  . Alcohol use: No    Alcohol/week: 0.0 standard drinks of alcohol  . Drug use: No  . Sexual activity: Not Currently  Other Topics Concern  . Not on file  Social History Narrative  . Not on file   Social Drivers of Health   Financial Resource Strain: Not on file  Food Insecurity: Not on file  Transportation Needs: No Transportation Needs (03/10/2023)   PRAPARE - Transportation   . Lack of Transportation (Medical): No   . Lack of Transportation (Non-Medical): No  Physical Activity: Not on file  Stress: Not on file  Social Connections: Not on file  Intimate Partner Violence: Not on file    Outpatient Medications Prior to Visit  Medication Sig Dispense Refill  . atorvastatin  (LIPITOR) 10 MG tablet Take 1 tablet (10 mg total) by mouth daily. 90 tablet 1  . benzonatate  (TESSALON ) 100 MG capsule Take 1 capsule (100 mg total) by mouth 3 (three) times daily as needed. 20 capsule 0  . budesonide -formoterol  (SYMBICORT ) 160-4.5 MCG/ACT inhaler Inhale 2 puffs into the lungs 2 (two) times daily. 10.2 g 12  . calcium -vitamin D  (OSCAL WITH D) 500-5 MG-MCG tablet Take 1 tablet by mouth daily with breakfast.    . famotidine  (PEPCID ) 40 MG tablet TAKE 1 TABLET BY MOUTH EVERY DAY 90 tablet 1  . HYDROcodone -acetaminophen  (NORCO/VICODIN) 5-325 MG tablet Take 1 tablet by mouth every 6 (six) hours as needed for moderate pain (pain score 4-6) or severe pain (pain score 7-10). 40 tablet 0  . levocetirizine (XYZAL ) 5 MG tablet Take 1 tablet (5 mg total) by mouth every evening. 30 tablet 5  . metoprolol  succinate (TOPROL -XL) 25 MG 24 hr tablet Take 1 tablet by mouth once daily 90 tablet 0  . montelukast  (SINGULAIR ) 10 MG tablet TAKE 1 TABLET BY MOUTH AT BEDTIME 90 tablet 1  . predniSONE  (DELTASONE ) 2.5 MG tablet Take 1 tablet by mouth once daily with  breakfast 90 tablet 0  . Pseudoephedrine-Guaifenesin  (MUCINEX  D MAX STRENGTH) [phone] MG TB12 Take 1 tablet by mouth 2 (two) times daily.    . albuterol  (VENTOLIN  HFA) 108 (90 Base) MCG/ACT inhaler INHALE ONE TO TWO PUFFS INTO THE LUNGS EVERY 6 HOURS AS NEEDED FOR WHEEZING OR SHORTNESS OF BREATH (Patient taking differently: Inhale 1-2 puffs into the lungs every 6 (six) hours as needed for shortness of breath or wheezing. INHALE ONE TO TWO PUFFS INTO THE LUNGS EVERY 6 HOURS AS NEEDED FOR WHEEZING OR SHORTNESS OF  BREATH) 18 each 5  . ferrous sulfate  325 (65 FE) MG tablet Take 1 tablet (325 mg total) by mouth 2 (two) times daily. 60 tablet 3  . fluticasone  (FLONASE ) 50 MCG/ACT nasal spray Place 2 sprays into both nostrils daily. 16 g 5  . gabapentin  (NEURONTIN ) 300 MG capsule TAKE 1 CAPSULE BY MOUTH THREE TIMES DAILY (Patient taking differently: Take 300 mg by mouth at bedtime.) 270 capsule 0  . HEMATINIC/FOLIC ACID  324-1 MG TABS TAKE 1 TABLET BY MOUTH EVERY DAY 90 tablet 1  . meloxicam  (MOBIC ) 7.5 MG tablet TAKE 1 TO 2 TABLETS BY MOUTH ONCE DAILY AS NEEDED FOR PAIN 30 tablet 0  . cefpodoxime  (VANTIN ) 200 MG tablet Take 200 mg by mouth daily. (Patient not taking: Reported on 06/15/2023)     Facility-Administered Medications Prior to Visit  Medication Dose Route Frequency Provider Last Rate Last Admin  . denosumab  (PROLIA ) injection 60 mg  60 mg Subcutaneous Once Alphia Behanna A, MD        Allergies  Allergen Reactions  . Avelox [Moxifloxacin Hcl In Nacl] Diarrhea, Nausea And Vomiting and Other (See Comments)    Dizziness, Cold chills   . Bactrim  [Sulfamethoxazole -Trimethoprim ] Hives  . Doxycycline      Abdominal discomfort, anorexia  . Sulfamethoxazole -Trimethoprim  Hives  . Amoxicillin  Other (See Comments)  . Augmentin  [Amoxicillin -Pot Clavulanate] Hives, Itching and Swelling    Review of Systems  Constitutional:  Negative for fever and malaise/fatigue.  HENT:  Negative for congestion.    Eyes:  Negative for blurred vision.  Respiratory:  Negative for shortness of breath.   Cardiovascular:  Negative for chest pain, palpitations and leg swelling.  Gastrointestinal:  Positive for blood in stool. Negative for abdominal pain, constipation, diarrhea, melena and nausea.  Genitourinary:  Negative for dysuria and frequency.  Musculoskeletal:  Positive for back pain, joint pain, myalgias and neck pain. Negative for falls.  Skin:  Negative for rash.  Neurological:  Negative for dizziness, loss of consciousness and headaches.  Endo/Heme/Allergies:  Negative for environmental allergies.  Psychiatric/Behavioral:  Negative for depression. The patient is not nervous/anxious.       Objective:    Physical Exam Constitutional:      General: She is not in acute distress.    Appearance: Normal appearance. She is well-developed. She is not toxic-appearing.  HENT:     Head: Normocephalic and atraumatic.     Right Ear: External ear normal.     Left Ear: External ear normal.     Nose: Nose normal.  Eyes:     General:        Right eye: No discharge.        Left eye: No discharge.     Conjunctiva/sclera: Conjunctivae normal.  Neck:     Thyroid : No thyromegaly.  Cardiovascular:     Rate and Rhythm: Normal rate and regular rhythm.     Heart sounds: Normal heart sounds. No murmur heard. Pulmonary:     Effort: Pulmonary effort is normal. No respiratory distress.     Breath sounds: Normal breath sounds.  Abdominal:     General: Bowel sounds are normal.     Palpations: Abdomen is soft.     Tenderness: There is no abdominal tenderness. There is no guarding.  Musculoskeletal:        General: Normal range of motion.     Cervical back: Neck supple.  Lymphadenopathy:     Cervical: No cervical adenopathy.  Skin:    General: Skin is warm  and dry.  Neurological:     Mental Status: She is alert and oriented to person, place, and time.  Psychiatric:        Mood and Affect: Mood normal.         Behavior: Behavior normal.        Thought Content: Thought content normal.        Judgment: Judgment normal.   BP (!) 146/94 (BP Location: Left Arm, Patient Position: Sitting, Cuff Size: Normal)   Pulse 79   Temp 97.7 F (36.5 C) (Oral)   Resp 18   Ht 5' 1 (1.549 m)   Wt 103 lb 6.4 oz (46.9 kg)   SpO2 95%   BMI 19.54 kg/m  Wt Readings from Last 3 Encounters:  06/15/23 103 lb 6.4 oz (46.9 kg)  05/03/23 105 lb (47.6 kg)  04/18/23 102 lb (46.3 kg)    Diabetic Foot Exam - Simple   No data filed    Lab Results  Component Value Date   WBC 6.3 04/10/2023   HGB 11.4 (L) 04/10/2023   HCT 35.7 (L) 04/10/2023   PLT 114 (L) 04/10/2023   GLUCOSE 80 03/09/2023   CHOL 180 03/09/2023   TRIG 97.0 03/09/2023   HDL 96.70 03/09/2023   LDLCALC 64 03/09/2023   ALT 10 03/09/2023   AST 16 03/09/2023   NA 140 03/09/2023   K 4.0 03/09/2023   CL 106 03/09/2023   CREATININE 0.74 03/09/2023   BUN 16 03/09/2023   CO2 25 03/09/2023   TSH 0.68 03/09/2023   INR 1.0 11/25/2022   HGBA1C 6.7 (H) 03/09/2023   MICROALBUR <0.7 03/09/2023    Lab Results  Component Value Date   TSH 0.68 03/09/2023   Lab Results  Component Value Date   WBC 6.3 04/10/2023   HGB 11.4 (L) 04/10/2023   HCT 35.7 (L) 04/10/2023   MCV 73.0 (L) 04/10/2023   PLT 114 (L) 04/10/2023   Lab Results  Component Value Date   NA 140 03/09/2023   K 4.0 03/09/2023   CO2 25 03/09/2023   GLUCOSE 80 03/09/2023   BUN 16 03/09/2023   CREATININE 0.74 03/09/2023   BILITOT 0.3 03/09/2023   ALKPHOS 54 03/09/2023   AST 16 03/09/2023   ALT 10 03/09/2023   PROT 7.8 03/09/2023   ALBUMIN 4.2 03/09/2023   CALCIUM  9.5 03/09/2023   ANIONGAP 7 02/09/2023   GFR 85.46 03/09/2023   Lab Results  Component Value Date   CHOL 180 03/09/2023   Lab Results  Component Value Date   HDL 96.70 03/09/2023   Lab Results  Component Value Date   LDLCALC 64 03/09/2023   Lab Results  Component Value Date   TRIG 97.0 03/09/2023    Lab Results  Component Value Date   CHOLHDL 2 03/09/2023   Lab Results  Component Value Date   HGBA1C 6.7 (H) 03/09/2023       Assessment & Plan:  Chronic cough Assessment & Plan: Patient with long history of chronic cough.  Orders: -     CBC with Differential/Platelet -     Albuterol  Sulfate HFA; INHALE ONE TO TWO PUFFS INTO THE LUNGS EVERY 6 HOURS AS NEEDED FOR WHEEZING OR SHORTNESS OF BREATH  Dispense: 18 each; Refill: 5  Osteoporosis, unspecified osteoporosis type, unspecified pathological fracture presence Assessment & Plan: Encouraged to get adequate exercise, calcium  and vitamin d  intake   Orders: -     DG Bone Density; Future  Rheumatoid lung disease (HCC) Assessment &  Plan: Follows with pulmonology   Colon cancer screening -     Ambulatory referral to Gastroenterology  Encounter for screening mammogram for malignant neoplasm of breast -     3D Screening Mammogram, Left and Right; Future  BRBPR (bright red blood per rectum) -     Ambulatory referral to Gastroenterology -     Iron, TIBC and Ferritin Panel  Vitamin D  deficiency -     Comprehensive metabolic panel -     VITAMIN D  25 Hydroxy (Vit-D Deficiency, Fractures)  Pernicious anemia -     Vitamin B12  Tachycardia -     Lipid panel -     TSH  Pain of right lower leg -     Meloxicam ; TAKE 1 TO 2 TABLETS BY MOUTH ONCE DAILY AS NEEDED FOR PAIN  Dispense: 60 tablet; Refill: 03  Iron deficiency anemia, unspecified iron deficiency anemia type -     Iron, TIBC and Ferritin Panel  Other chronic sinusitis  Acute non-recurrent pansinusitis -     Fluticasone  Propionate; Place 2 sprays into both nostrils daily.  Dispense: 16 g; Refill: 5  Other orders -     Gabapentin ; Take 1 capsule (300 mg total) by mouth 2 (two) times daily as needed.  Dispense: 180 capsule; Refill: 1 -     Hematinic/Folic Acid ; Take 1 tablet by mouth daily.  Dispense: 90 tablet; Refill: 1    Assessment and Plan     Osteoporosis History of treatment with Fosamax, currently not on treatment. DEXA scan from 2022 showed osteoporosis. Discussed potential re-initiation of Fosamax or consideration of twice-yearly injectable treatment. -Order DEXA scan to assess current bone density status. -Consider re-initiation of Fosamax or switch to injectable treatment based on DEXA results.  Gastrointestinal Health Occasional blood in stool, 3 bowel movements per day, formed, no straining. No history of colonoscopy. -Refer to gastroenterology for colonoscopy, with note regarding occasional bloody stool.  Cervical Cancer Screening No recent Pap smear, no history of hysterectomy. -Plan for Pap smear at next physical or refer to GYN.  Vitamin B12 Deficiency Positive intrinsic factor, currently on monthly B12 injections. -Continue monthly B12 injections. -Check B12 levels and iron stores.  Rheumatoid Arthritis History of treatment with methotrexate, currently not on treatment. Using meloxicam  for flare-ups. -Increase meloxicam  prescription to 60 tablets of 7.5mg  for use as needed. -Consider re-evaluation by rheumatology for potential re-initiation of methotrexate or other treatment.  Chronic Cough Improvement with recent antibiotic treatment. -Continue current management.  General Health Maintenance -Order mammogram. -Continue monthly B12 injections. -Consider RSV, shingles, and tetanus boosters. -Plan for 9-month follow-up and 28-month physical. -Check blood pressure at home periodically.         Harlene Horton, MD

## 2023-06-17 NOTE — Assessment & Plan Note (Signed)
 Supplement and monitor

## 2023-06-17 NOTE — Assessment & Plan Note (Signed)
 It is small amounts intermittently. Check iron stores and cbc and referred to Gastroenterology for further consideration

## 2023-06-19 NOTE — Telephone Encounter (Signed)
 Per Joline Salt: "I submitted in [employer] on the 10th. Benefit verification is in progress. I will check back in a little bit to see if it has came back"

## 2023-06-20 ENCOUNTER — Other Ambulatory Visit (HOSPITAL_COMMUNITY): Payer: Self-pay

## 2023-06-20 NOTE — Telephone Encounter (Signed)
 Pharmacy Patient Advocate Encounter   Received notification from  [employer] Portal that prior authorization for PROLIA  is required/requested.   Insurance verification completed.   The patient is insured through [employer] .   Per test claim: PA required; PA submitted to above mentioned insurance via Availity Key/confirmation #/[id number] Status is pending

## 2023-06-20 NOTE — Telephone Encounter (Signed)
 Marland Kitchen

## 2023-06-21 ENCOUNTER — Other Ambulatory Visit (HOSPITAL_COMMUNITY): Payer: Self-pay

## 2023-06-21 NOTE — Telephone Encounter (Signed)
.  Pt ready for scheduling for PROLIA  on or after : 06/21/23  Out-of-pocket cost due at time of visit: $1250 (DEDUCTIBLE)  Number of injection/visits approved: 2  Primary: [employer] Prolia  co-insurance: 0% Admin fee co-insurance: 0%  Secondary: --- Prolia  co-insurance:  Admin fee co-insurance:   Medical Benefit Details: Date Benefits were checked: 06/19/23 Deductible: $0 MET OF $1250 REQUIRED/ Coinsurance: 0%/ Admin Fee: 0%  Prior Auth: APPROVED PA# [id number] Expiration Date: 06/20/23-06/18/24 (Medical) / 08/25/22- 08/25/23 (Specialty pharmacy)  # of doses approved: 2  Pharmacy benefit: Copay $--- (must fill at specialty pharmacy) If patient wants fill through the pharmacy benefit please send prescription to: [facility], and include estimated need by date in rx notes. Pharmacy will ship medication directly to the office.  Patient is eligible for Prolia  Copay Card. Copay Card can make patient's cost as little as $25. Link to apply: [email or web address]://www.amgensupportplus.com/copay  ** This summary of benefits is an estimation of the patient's out-of-pocket cost. Exact cost may very based on individual plan coverage.

## 2023-06-21 NOTE — Telephone Encounter (Signed)
   CASE NUMBER: [id number]

## 2023-06-22 NOTE — Telephone Encounter (Signed)
This pts copay will be  $1250  this month for her injection d/t her deductible. I honestly did not even bother to call yet because of the high cost. I just wanted to let you know first.

## 2023-06-27 ENCOUNTER — Ambulatory Visit: Payer: 59

## 2023-06-28 ENCOUNTER — Other Ambulatory Visit: Payer: Self-pay

## 2023-06-28 ENCOUNTER — Other Ambulatory Visit (HOSPITAL_COMMUNITY): Payer: Self-pay

## 2023-06-28 MED ORDER — DENOSUMAB 60 MG/ML ~~LOC~~ SOSY
60.0000 mg | PREFILLED_SYRINGE | SUBCUTANEOUS | 0 refills | Status: DC
  Filled 2023-06-28: qty 1, 180d supply, fill #0

## 2023-06-28 NOTE — Telephone Encounter (Signed)
Our pharmacy was not a preferred location.  Spoke with [facility] and they stated that pt will have to use [facility] -[phone], fax- [phone] and she stated that with pt ins it will be an estimated $750 OOP for patient.  Patient can sign up for [employer] Copay card and that may can help.  After pt signs up she can call the specialty pharmacy to get set up and they can take copay card info at that time.  Left message for patient to call back.

## 2023-06-28 NOTE — Telephone Encounter (Signed)
Advised pt that we will check pharmacy first.  Rx sent to [facility].

## 2023-06-28 NOTE — Addendum Note (Signed)
Addended by: Thelma Barge D on: 06/28/2023 12:54 PM   Modules accepted: Orders

## 2023-06-29 NOTE — Telephone Encounter (Signed)
Spoke with patient and she stated that the price is too much at this time.  Advised her that there was a copay card that she could do online, but she stated that she will be 63 this year and will be changing to medicare.  Do you think she is ok to wait til then, change to another treatment, or try the copay card?

## 2023-06-30 NOTE — Telephone Encounter (Signed)
Patient notified and she will let us know when she gets new insurance so we can rerun.

## 2023-07-02 ENCOUNTER — Other Ambulatory Visit: Payer: Self-pay | Admitting: Family Medicine

## 2023-07-04 ENCOUNTER — Other Ambulatory Visit (HOSPITAL_COMMUNITY): Payer: Self-pay

## 2023-07-04 ENCOUNTER — Telehealth: Payer: Self-pay

## 2023-07-04 NOTE — Telephone Encounter (Signed)
Pharmacy Patient Advocate Encounter   Received notification from CoverMyMeds that prior authorization for Meloxicam 7.5MG  tablets is required/requested.   Insurance verification completed.   The patient is insured through [facility] .   Per test claim:  Meloxicamm 15mg  (vs 7.5mg ) is preferred by the insurance.  If suggested medication is appropriate, Please send in a new RX and discontinue this one. If not, please advise as to why it's not appropriate so that we may request a Prior Authorization. Please note, some preferred medications may still require a PA.  If the suggested medications have not been trialed and there are no contraindications to their use, the PA will not be submitted, as it will not be approved.

## 2023-07-05 NOTE — Telephone Encounter (Signed)
This CMA checked the patient's chart, and the prescription was just filled on 06/15/23. It's for the 7.5 mg tablet

## 2023-07-07 ENCOUNTER — Other Ambulatory Visit (HOSPITAL_BASED_OUTPATIENT_CLINIC_OR_DEPARTMENT_OTHER): Payer: Self-pay

## 2023-07-07 ENCOUNTER — Ambulatory Visit (INDEPENDENT_AMBULATORY_CARE_PROVIDER_SITE_OTHER): Payer: 59 | Admitting: Emergency Medicine

## 2023-07-07 DIAGNOSIS — E538 Deficiency of other specified B group vitamins: Secondary | ICD-10-CM

## 2023-07-07 MED ORDER — CYANOCOBALAMIN 1000 MCG/ML IJ SOLN
1000.0000 ug | Freq: Once | INTRAMUSCULAR | Status: AC
  Administered 2023-07-07: 1000 ug via INTRAMUSCULAR

## 2023-07-07 MED ORDER — SHINGRIX 50 MCG/0.5ML IM SUSR
INTRAMUSCULAR | 1 refills | Status: DC
  Filled 2023-07-07: qty 0.5, 1d supply, fill #0

## 2023-07-07 NOTE — Progress Notes (Cosign Needed Addendum)
Patient here for monthly b12 injection per physicians order.  Injection given in left deltoid and patient tolerated well.  

## 2023-07-08 ENCOUNTER — Other Ambulatory Visit: Payer: Self-pay | Admitting: Internal Medicine

## 2023-07-08 ENCOUNTER — Other Ambulatory Visit: Payer: Self-pay | Admitting: Family Medicine

## 2023-07-10 ENCOUNTER — Inpatient Hospital Stay (HOSPITAL_BASED_OUTPATIENT_CLINIC_OR_DEPARTMENT_OTHER): Payer: 59 | Admitting: Internal Medicine

## 2023-07-10 ENCOUNTER — Inpatient Hospital Stay: Payer: 59 | Attending: Internal Medicine

## 2023-07-10 VITALS — BP 150/75 | HR 83 | Temp 98.4°F | Resp 16 | Ht 61.0 in | Wt 106.5 lb

## 2023-07-10 DIAGNOSIS — E538 Deficiency of other specified B group vitamins: Secondary | ICD-10-CM | POA: Insufficient documentation

## 2023-07-10 DIAGNOSIS — M810 Age-related osteoporosis without current pathological fracture: Secondary | ICD-10-CM | POA: Insufficient documentation

## 2023-07-10 DIAGNOSIS — K909 Intestinal malabsorption, unspecified: Secondary | ICD-10-CM | POA: Insufficient documentation

## 2023-07-10 DIAGNOSIS — Z7952 Long term (current) use of systemic steroids: Secondary | ICD-10-CM | POA: Diagnosis not present

## 2023-07-10 DIAGNOSIS — D508 Other iron deficiency anemias: Secondary | ICD-10-CM

## 2023-07-10 DIAGNOSIS — I10 Essential (primary) hypertension: Secondary | ICD-10-CM | POA: Insufficient documentation

## 2023-07-10 DIAGNOSIS — D509 Iron deficiency anemia, unspecified: Secondary | ICD-10-CM | POA: Insufficient documentation

## 2023-07-10 DIAGNOSIS — R5383 Other fatigue: Secondary | ICD-10-CM | POA: Insufficient documentation

## 2023-07-10 DIAGNOSIS — K219 Gastro-esophageal reflux disease without esophagitis: Secondary | ICD-10-CM | POA: Insufficient documentation

## 2023-07-10 DIAGNOSIS — M069 Rheumatoid arthritis, unspecified: Secondary | ICD-10-CM | POA: Diagnosis not present

## 2023-07-10 DIAGNOSIS — Z79899 Other long term (current) drug therapy: Secondary | ICD-10-CM | POA: Diagnosis not present

## 2023-07-10 DIAGNOSIS — D518 Other vitamin B12 deficiency anemias: Secondary | ICD-10-CM

## 2023-07-10 DIAGNOSIS — Z7951 Long term (current) use of inhaled steroids: Secondary | ICD-10-CM | POA: Diagnosis not present

## 2023-07-10 LAB — CBC WITH DIFFERENTIAL (CANCER CENTER ONLY)
Abs Immature Granulocytes: 0.02 10*3/uL (ref 0.00–0.07)
Basophils Absolute: 0 10*3/uL (ref 0.0–0.1)
Basophils Relative: 0 %
Eosinophils Absolute: 0.1 10*3/uL (ref 0.0–0.5)
Eosinophils Relative: 1 %
HCT: 32.7 % — ABNORMAL LOW (ref 36.0–46.0)
Hemoglobin: 10.3 g/dL — ABNORMAL LOW (ref 12.0–15.0)
Immature Granulocytes: 0 %
Lymphocytes Relative: 18 %
Lymphs Abs: 1 10*3/uL (ref 0.7–4.0)
MCH: 23.5 pg — ABNORMAL LOW (ref 26.0–34.0)
MCHC: 31.5 g/dL (ref 30.0–36.0)
MCV: 74.7 fL — ABNORMAL LOW (ref 80.0–100.0)
Monocytes Absolute: 0.5 10*3/uL (ref 0.1–1.0)
Monocytes Relative: 9 %
Neutro Abs: 4.2 10*3/uL (ref 1.7–7.7)
Neutrophils Relative %: 72 %
Platelet Count: 156 10*3/uL (ref 150–400)
RBC: 4.38 MIL/uL (ref 3.87–5.11)
RDW: 15.5 % (ref 11.5–15.5)
WBC Count: 5.8 10*3/uL (ref 4.0–10.5)
nRBC: 0 % (ref 0.0–0.2)

## 2023-07-10 LAB — IRON AND IRON BINDING CAPACITY (CC-WL,HP ONLY)
Iron: 108 ug/dL (ref 28–170)
Saturation Ratios: 40 % — ABNORMAL HIGH (ref 10.4–31.8)
TIBC: 270 ug/dL (ref 250–450)
UIBC: 162 ug/dL (ref 148–442)

## 2023-07-10 LAB — VITAMIN B12: Vitamin B-12: 1119 pg/mL — ABNORMAL HIGH (ref 180–914)

## 2023-07-10 LAB — FOLATE: Folate: 26 ng/mL (ref >5.9)

## 2023-07-10 LAB — FERRITIN: Ferritin: 958 ng/mL — ABNORMAL HIGH (ref 11–307)

## 2023-07-10 NOTE — Progress Notes (Signed)
[facility] [phone]   [phone]  OFFICE PROGRESS NOTE  [name], MD [address] [place] [address]  DIAGNOSIS: microcytic anemia likely from iron deficiency and probably from a gastrointestinal blood loss.   PRIOR THERAPY: None  CURRENT THERAPY: Oral iron tablet with ferrous fumarate in addition to folic acid and vitamin B12 injection  INTERVAL HISTORY: Jamie Cox 63 y.o. female returns to the clinic today for follow-up visit.Discussed the use of AI scribe software for clinical note transcription with the patient, who gave verbal consent to proceed.  History of Present Illness   The patient is a 63 year old female with anemia who presents for follow-up regarding vitamin B12 deficiency and blood loss.  She has been experiencing anemia for an extended period and has been diagnosed with vitamin B12 deficiency due to the presence of antibodies to the intrinsic factor, affecting absorption. She started receiving vitamin B12 injections, initially once a week for four weeks, and has been on a monthly regimen for the past four months. She feels less exhausted since starting the injections. No fatigue or exhaustion as before starting the B12 shots.  She reports occasional blood in her stool, described as 'in my bowels,' but denies any other bleeding issues such as hemorrhoids. She has not seen a gastroenterologist recently but has an appointment scheduled this week.  She is currently taking folic acid and ferrous fumarate for her anemia. She previously took both ferrous sulfate and ferrous fumarate but is now only taking one of them.        MEDICAL HISTORY: Past Medical History:  Diagnosis Date   Allergy    Asthma    Atypical chest pain    Chronic sinusitis    Followed by Dr. Jenne Pane   Environmental allergies    GERD (gastroesophageal reflux disease)    High serum parathyroid hormone (PTH) 06/09/2016   History  of chicken pox    Hypercalcemia 07/19/2015   Hypertension    Osteoporosis, unspecified    steroid induced   Personal history of other diseases of circulatory system    Rheumatoid arthritis(714.0)    Unspecified deficiency anemia    microcytic   Urticaria     ALLERGIES:  is allergic to avelox [moxifloxacin hcl in nacl], bactrim [sulfamethoxazole-trimethoprim], doxycycline, sulfamethoxazole-trimethoprim, amoxicillin, and augmentin [amoxicillin-pot clavulanate].  MEDICATIONS:  Current Outpatient Medications  Medication Sig Dispense Refill   albuterol (VENTOLIN HFA) 108 (90 Base) MCG/ACT inhaler INHALE ONE TO TWO PUFFS INTO THE LUNGS EVERY 6 HOURS AS NEEDED FOR WHEEZING OR SHORTNESS OF BREATH 18 each 5   atorvastatin (LIPITOR) 10 MG tablet Take 1 tablet by mouth once daily 90 tablet 0   benzonatate (TESSALON) 100 MG capsule Take 1 capsule (100 mg total) by mouth 3 (three) times daily as needed. 20 capsule 0   budesonide-formoterol (SYMBICORT) 160-4.5 MCG/ACT inhaler Inhale 2 puffs into the lungs 2 (two) times daily. 10.2 g 12   calcium-vitamin D (OSCAL WITH D) 500-5 MG-MCG tablet Take 1 tablet by mouth daily with breakfast.     famotidine (PEPCID) 40 MG tablet TAKE 1 TABLET BY MOUTH EVERY DAY 90 tablet 1   Ferrous Fumarate-Folic Acid (HEMATINIC/FOLIC ACID) 324-1 MG TABS Take 1 tablet by mouth daily. 90 tablet 1   fluticasone (FLONASE) 50 MCG/ACT nasal spray Place 2 sprays into both nostrils daily. 16 g 5   gabapentin (NEURONTIN) 300 MG capsule Take 1 capsule (300 mg  total) by mouth 2 (two) times daily as needed. 180 capsule 1   HYDROcodone-acetaminophen (NORCO/VICODIN) 5-325 MG tablet Take 1 tablet by mouth every 6 (six) hours as needed for moderate pain (pain score 4-6) or severe pain (pain score 7-10). 40 tablet 0   levocetirizine (XYZAL) 5 MG tablet Take 1 tablet (5 mg total) by mouth every evening. 30 tablet 5   meloxicam (MOBIC) 7.5 MG tablet TAKE 1 TO 2 TABLETS BY MOUTH ONCE DAILY AS  NEEDED FOR PAIN 60 tablet 03   metoprolol succinate (TOPROL-XL) 25 MG 24 hr tablet Take 1 tablet by mouth once daily 90 tablet 0   montelukast (SINGULAIR) 10 MG tablet TAKE 1 TABLET BY MOUTH AT BEDTIME 90 tablet 1   predniSONE (DELTASONE) 2.5 MG tablet Take 1 tablet by mouth once daily with breakfast 90 tablet 0   Pseudoephedrine-Guaifenesin (MUCINEX D MAX STRENGTH) [phone] MG TB12 Take 1 tablet by mouth 2 (two) times daily.     Zoster Vaccine Adjuvanted [facility]) injection Inject into the muscle. 0.5 mL 1   Current Facility-Administered Medications  Medication Dose Route Frequency Provider Last Rate Last Admin   denosumab (PROLIA) injection 60 mg  60 mg Subcutaneous Once [name], MD        SURGICAL HISTORY:  Past Surgical History:  Procedure Laterality Date   NASAL SINUS SURGERY  06/2012   NASAL SINUS SURGERY     2010   OPEN REDUCTION INTERNAL FIXATION (ORIF) METACARPAL Right 07/13/2022   Procedure: OPEN REDUCTION INTERNAL FIXATION METACARPAL RIGHT SMALL FINGER;  Surgeon: Bradly Bienenstock, MD;  Location: [facility] OR;  Service: Orthopedics;  Laterality: Right;  regional with iv sedation    REVIEW OF SYSTEMS:  A comprehensive review of systems was negative except for: Constitutional: positive for fatigue Respiratory: positive for cough and dyspnea on exertion   PHYSICAL EXAMINATION: General appearance: alert, cooperative, fatigued, and no distress Head: Normocephalic, without obvious abnormality, atraumatic Neck: no adenopathy, no JVD, supple, symmetrical, trachea midline, and thyroid not enlarged, symmetric, no tenderness/mass/nodules Lymph nodes: Cervical, supraclavicular, and axillary nodes normal. Resp: clear to auscultation bilaterally Back: symmetric, no curvature. ROM normal. No CVA tenderness. Cardio: regular rate and rhythm, S1, S2 normal, no murmur, click, rub or gallop GI: soft, non-tender; bowel sounds normal; no masses,  no organomegaly Extremities: extremities normal,  atraumatic, no cyanosis or edema  ECOG PERFORMANCE STATUS: 1 - Symptomatic but completely ambulatory  Blood pressure (!) 150/75, pulse 83, temperature 98.4 F (36.9 C), temperature source Temporal, resp. rate 16, height 5\' 1"  (1.549 m), weight 106 lb 8 oz (48.3 kg), SpO2 100%.  LABORATORY DATA: Lab Results  Component Value Date   WBC 5.8 07/10/2023   HGB 10.3 (L) 07/10/2023   HCT 32.7 (L) 07/10/2023   MCV 74.7 (L) 07/10/2023   PLT 156 07/10/2023      Chemistry      Component Value Date/Time   NA 140 03/09/2023 1240   K 4.0 03/09/2023 1240   CL 106 03/09/2023 1240   CO2 25 03/09/2023 1240   BUN 16 03/09/2023 1240   CREATININE 0.74 03/09/2023 1240   CREATININE 0.83 02/09/2023 1136   CREATININE 0.71 03/31/2020 1108      Component Value Date/Time   CALCIUM 9.5 03/09/2023 1240   ALKPHOS 54 03/09/2023 1240   AST 16 03/09/2023 1240   AST 17 02/09/2023 1136   ALT 10 03/09/2023 1240   ALT 12 02/09/2023 1136   BILITOT 0.3 03/09/2023 1240   BILITOT 0.3 02/09/2023  1136       RADIOGRAPHIC STUDIES: No results found.  ASSESSMENT AND PLAN: This is a very pleasant 63 years old African-American female with Iron Deficiency Anemia Previously diagnosed with iron deficiency anemia and started on iron supplementation, folic acid, and vitamin B12 injections. 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 Noted improvement in fatigue since starting treatment.    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 Hemoglobin at 10.3 g/dL suggests ongoing gastrointestinal blood loss. Reports occasional hematochezia. Awaiting iron studies to determine need for iron infusion. 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 On monthly vitamin B12 injections for the past four months with reported improvement in fatigue. - Continue monthly vitamin B12 injections  Follow-up - Follow-up appointment in three months - Repeat blood work at follow-up.   The patient was advised to call immediately if she has any concerning symptoms in the interval. The patient voices understanding of current disease status and treatment options and is in agreement with the current care plan.  All questions were answered. The patient knows to call the clinic with any problems, questions or concerns. We can certainly see the patient much sooner if necessary.  The total time spent in the appointment was 20 minutes.  Disclaimer: This note was dictated with voice recognition software. Similar sounding words can inadvertently be transcribed and may not be corrected upon review.       Follow-up visit.

## 2023-07-11 ENCOUNTER — Ambulatory Visit: Payer: 59 | Admitting: Podiatry

## 2023-07-11 ENCOUNTER — Encounter: Payer: Self-pay | Admitting: Podiatry

## 2023-07-11 VITALS — Ht 61.0 in | Wt 106.0 lb

## 2023-07-11 DIAGNOSIS — B351 Tinea unguium: Secondary | ICD-10-CM

## 2023-07-11 DIAGNOSIS — Q828 Other specified congenital malformations of skin: Secondary | ICD-10-CM

## 2023-07-11 DIAGNOSIS — L84 Corns and callosities: Secondary | ICD-10-CM

## 2023-07-11 DIAGNOSIS — E119 Type 2 diabetes mellitus without complications: Secondary | ICD-10-CM

## 2023-07-11 DIAGNOSIS — M79676 Pain in unspecified toe(s): Secondary | ICD-10-CM

## 2023-07-12 ENCOUNTER — Encounter: Payer: Self-pay | Admitting: Physician Assistant

## 2023-07-13 ENCOUNTER — Other Ambulatory Visit (HOSPITAL_BASED_OUTPATIENT_CLINIC_OR_DEPARTMENT_OTHER): Payer: 59

## 2023-07-13 ENCOUNTER — Inpatient Hospital Stay (HOSPITAL_BASED_OUTPATIENT_CLINIC_OR_DEPARTMENT_OTHER): Admission: RE | Admit: 2023-07-13 | Payer: 59 | Source: Ambulatory Visit

## 2023-07-14 ENCOUNTER — Other Ambulatory Visit: Payer: Self-pay | Admitting: Family Medicine

## 2023-07-17 ENCOUNTER — Encounter: Payer: Self-pay | Admitting: Podiatry

## 2023-07-17 NOTE — Progress Notes (Signed)
 Subjective:  Patient ID: Jamie Cox, female    DOB: [birth year],  MRN: [id number]  63 y.o. female presents to clinic with  preventative diabetic foot care and callus(es) left lower extremity, porokeratotic lesion(s) b/l feet, and painful mycotic nails. Painful toenails interfere with ambulation. Aggravating factors include wearing enclosed shoe gear. Pain is relieved with periodic professional debridement. Painful callus(es) and porokeratotic lesion(s) are aggravated when weightbearing with and without shoegear. Pain is relieved with periodic professional debridement.  Chief Complaint  Patient presents with   RFC    She is here for a nail trim, PCP is Dr. Sharmon  and last seen 06/15/23.    New problem(s): None   PCP is Domenica Harlene LABOR, MD.  Allergies  Allergen Reactions   Avelox [Moxifloxacin Hcl In Nacl] Diarrhea, Nausea And Vomiting and Other (See Comments)    Dizziness, Cold chills    Bactrim  [Sulfamethoxazole -Trimethoprim ] Hives   Doxycycline      Abdominal discomfort, anorexia   Sulfamethoxazole -Trimethoprim  Hives   Augmentin  [Amoxicillin -Pot Clavulanate] Hives, Itching and Swelling   Amoxicillin  Other (See Comments)    Review of Systems: Negative except as noted in the HPI.   Objective:  Jamie Cox is a pleasant 63 y.o. female WD, WN in NAD. AAO x 3.  Vascular Examination: Vascular status intact b/l with palpable pedal pulses. CFT immediate b/l. No edema. No pain with calf compression b/l. Skin temperature gradient WNL b/l. No ischemia or gangrene noted b/l LE. No cyanosis or clubbing noted b/l LE.  Neurological Examination: Sensation grossly intact b/l with 10 gram monofilament. Vibratory sensation intact b/l.   Dermatological Examination: Pedal skin with normal turgor, texture and tone b/l. Toenails 1-5 b/l thick, discolored, elongated with subungual debris and pain on dorsal palpation.Hyperkeratotic lesion(s) R hallux.  No erythema, no edema, no drainage, no  fluctuance. Porokeratotic lesion(s) submet head 3 left foot, submet head 3 right foot, and submet head 5 left foot. No erythema, no edema, no drainage, no fluctuance.  Musculoskeletal Examination: Muscle strength 5/5 to b/l LE. HAV with bunion bilaterally and hammertoes 2-5 b/l.  Radiographs: None  Last A1c:      Latest Ref Rng & Units 03/09/2023   12:40 PM 11/25/2022   12:11 AM  Hemoglobin A1C  Hemoglobin-A1c 4.6 - 6.5 % 6.7  5.9      Assessment:   1. Pain due to onychomycosis of toenail   2. Callus   3. Porokeratosis   4. Controlled type 2 diabetes mellitus without complication, without long-term current use of insulin  (HCC)     Plan:  -Consent given for treatment as described below: -Examined patient. -Continue foot and shoe inspections daily. Monitor blood glucose per PCP/Endocrinologist's recommendations. -Mycotic toenails 1-5 bilaterally were debrided in length and girth with sterile nail nippers and dremel without incident. -Callus(es) R hallux pared utilizing sharp debridement with sterile blade without complication or incident. Total number debrided =1. -Porokeratotic lesion(s) submet head 3 left foot, submet head 3 right foot, and submet head 5 left foot pared and enucleated with sterile currette without incident. Total number of lesions debrided=3. -Patient/POA to call should there be question/concern in the interim.  No follow-ups on file.  Delon LITTIE Merlin, DPM      [facility] LOCATION: [address]  [place], [place] [address]                   Office [phone]   [facility] LOCATION: [address] [place], [place] [address] Office [phone]

## 2023-07-18 ENCOUNTER — Ambulatory Visit (HOSPITAL_BASED_OUTPATIENT_CLINIC_OR_DEPARTMENT_OTHER): Payer: 59

## 2023-07-18 ENCOUNTER — Other Ambulatory Visit (HOSPITAL_BASED_OUTPATIENT_CLINIC_OR_DEPARTMENT_OTHER): Payer: 59

## 2023-07-25 ENCOUNTER — Encounter (HOSPITAL_BASED_OUTPATIENT_CLINIC_OR_DEPARTMENT_OTHER): Payer: Self-pay

## 2023-07-25 ENCOUNTER — Ambulatory Visit (HOSPITAL_BASED_OUTPATIENT_CLINIC_OR_DEPARTMENT_OTHER)
Admission: RE | Admit: 2023-07-25 | Discharge: 2023-07-25 | Disposition: A | Discharge status: Home / Self Care | Payer: 59 | Source: Ambulatory Visit | Attending: Family Medicine | Admitting: Family Medicine

## 2023-07-25 DIAGNOSIS — M810 Age-related osteoporosis without current pathological fracture: Secondary | ICD-10-CM | POA: Diagnosis present

## 2023-07-25 DIAGNOSIS — Z1231 Encounter for screening mammogram for malignant neoplasm of breast: Secondary | ICD-10-CM | POA: Diagnosis present

## 2023-07-26 ENCOUNTER — Encounter: Payer: Self-pay | Admitting: Family Medicine

## 2023-08-03 ENCOUNTER — Telehealth: Payer: Self-pay

## 2023-08-03 NOTE — Telephone Encounter (Signed)
 Called and spoke with patient. Next appointment for Monthly B 12 has been scheduled.

## 2023-08-03 NOTE — Telephone Encounter (Signed)
 Copied from CRM [id number]. Topic: General - Other >> Aug 03, 2023  8:16 AM [place] A wrote: Reason for CRM: Patient called to leave a message for [facility] nurse. Patient wants to know if she should have a B-12 appointment this month, because she has not had one-please contact

## 2023-08-07 ENCOUNTER — Other Ambulatory Visit: Payer: Self-pay | Admitting: Family Medicine

## 2023-08-08 ENCOUNTER — Ambulatory Visit: Payer: 59

## 2023-08-10 ENCOUNTER — Ambulatory Visit

## 2023-08-10 DIAGNOSIS — E538 Deficiency of other specified B group vitamins: Secondary | ICD-10-CM

## 2023-08-10 MED ORDER — CYANOCOBALAMIN 1000 MCG/ML IJ SOLN
1000.0000 ug | Freq: Once | INTRAMUSCULAR | Status: AC
  Administered 2023-08-10: 1000 ug via INTRAMUSCULAR

## 2023-08-10 NOTE — Progress Notes (Signed)
 Jamie Cox is a 63 y.o. female presents to the office today for B12 injection, per physician's orders. Original order: Dr. Abner Greenspan Cyanocobalamin (med), 1000 mg/ml (dose),  IM (route) was administered Left shoulder (location) today. Patient tolerated injection.  Patient next injection due: in one month, appt made Yes for 09/12/23.   Wilford Corner

## 2023-08-23 NOTE — Progress Notes (Unsigned)
 08/24/2023 Jamie Cox [id number] 04/26/1961  Referring provider: [name], MD Primary GI doctor: Dr. Myrtie Neither  ASSESSMENT AND PLAN:   Rectal bleeding with anemia, FOBT positive in the office with dark/melenic stools 10/2012 colonoscopy for screening purposes unremarkable recall 10 years EGD 2018 for cough, unremarkable 07/10/2023  HGB 10.3 MCV 74.7 Platelets 156 11/25/2022 patient had iron deficiency anemia iron 11, most recent iron feb 108 (160)  Patient's had elevated ferritin likely due to inflammatory response Recent Labs    11/24/22 1224 11/25/22 0011 11/25/22 0026 11/26/22 0352 11/30/22 1147 02/09/23 1136 03/09/23 1240 04/10/23 1314 07/10/23 1056  HGB 9.4* 9.3* 9.9* 8.2* 10.9* 11.2* 11.2* 11.4* 10.3*   Dark red spots mixed in with her stools intermittent Patient is on Mobic and prednisone long-term, had positive FOBT and more dark stools possible melena, patient has been off of iron Denies AB pain, nausea, vomiting,  Fecal urgency, occ diarrhea  -We will plan on EGD and colonoscopy with Dr. Myrtie Neither at the [facility] to evaluate for iron deficiency anemia especially with FOBT positive stools, some concern for peptic ulcer disease NSAID induced, last colonoscopy 10 years ago. -Will start patient on pantoprazole 20 mg daily -Will repeat CBC and iron indices to see where patient is at at this moment, may need to restart iron/IV iron Risk of bowel prep, conscious sedation, and EGD and colonoscopy were discussed.  Risks include but are not limited to dehydration, pain, bleeding, cardiopulmonary process, bowel perforation, or other possible adverse outcomes..  Treatment plan was discussed with patient, and agreed upon.  Fecal and urinary incontinence Decreased rectal tone on exam Likely pelvic floor Add on fiber 2-3 times daily Schedule colonoscopy, consider pelvic floor referral afterwards  B12 deficiency 6 months ago had B12 deficiency of 170 currently on  replacement, most recent recheck normal  GERD with history of chronic cough Denies dysphagia, dark stools while on iron supplements 2018 EGD Dr. Myrtie Neither for cough larynx normal some I had a hernia normal stomach normal duodenum Found to have RA lung disease On Mobic 7.5 mg 1 to 2 tablets daily and prednisone 2.5mg  She is on pepcid 40 mg  Rheumatoid lung disease with cough variant/bronchiectasis On prednisone 2.5 mg daily On Symbicort and albuterol as needed No oxygen at night Seems to be well-controlled at this time  Mild MR Echocardiogram 2019 for chest discomfort EF normal  Type 2 diabetes  Patient Care Team: [name], MD as PCP - General (Family Medicine) Mardella Layman, MD as Consulting Physician (Gastroenterology)  HISTORY OF PRESENT ILLNESS: Discussed the use of AI scribe software for clinical note transcription with the patient, who gave verbal consent to proceed.  History of Present Illness   Jamie Cox is a 63 year old female who presents for follow-up and evaluation of gastrointestinal symptoms.  She experiences gastrointestinal symptoms including dark stools, which she associates with iron supplementation, and denies black stools when off iron. Occasionally, she notices dark red mixed in her stool but not bright red blood. She experiences diarrhea, which she attributes to dietary factors, with increased urgency and occasional fecal incontinence. No abdominal pain, heartburn, or reflux. She denies reflux, heartburn, abdominal pain, bright red blood in stool, and burning during urination. She denies vaginal heaviness.  She has a history of iron deficiency anemia, initially noted in June of last year with an iron level of 11. Iron supplements improved her iron level to 160 by November. She is currently taking folic  acid with iron and has been tolerating it well. She was hospitalized with pneumonia in June and was put on more iron. However, she did not get a  refill for the iron a couple of months ago and has been off it since. Her iron level was 108 eight weeks ago, and she was not on iron at that time. She has been borderline anemic for a long time.  She is on famotidine for gastrointestinal symptoms and takes Mobic and prednisone for joint issues. She reports a bad flare-up some months ago and finds it difficult to taper off prednisone due to joint discomfort.  She uses a nebulizer for breathing treatments at home due to a bad cough but is not on oxygen. She has Symbicort, which she does not use daily, and albuterol for rescue. She had a respiratory exacerbation in June but none recently. No trouble swallowing or painful swallowing.      She  reports that she has never smoked. She has never used smokeless tobacco. She reports that she does not drink alcohol and does not use drugs.  RELEVANT GI HISTORY, IMAGING AND LABS: Results   LABS Iron: 11 g/dL (40/9811) Iron: 914 g/dL (78/2956) Iron: 213 g/dL (01/6577) I69: 629 pg/mL (06/2023) Fecal occult blood: positive (08/24/2023)      CBC    Component Value Date/Time   WBC 5.8 07/10/2023 1056   WBC 5.4 03/09/2023 1240   RBC 4.38 07/10/2023 1056   HGB 10.3 (L) 07/10/2023 1056   HGB 10.4 (L) 08/18/2020 1415   HCT 32.7 (L) 07/10/2023 1056   HCT 34.4 08/18/2020 1415   PLT 156 07/10/2023 1056   MCV 74.7 (L) 07/10/2023 1056   MCV 76 (L) 08/18/2020 1415   MCH 23.5 (L) 07/10/2023 1056   MCHC 31.5 07/10/2023 1056   RDW 15.5 07/10/2023 1056   RDW 16.2 (H) 08/18/2020 1415   LYMPHSABS 1.0 07/10/2023 1056   LYMPHSABS 1.2 08/18/2020 1415   MONOABS 0.5 07/10/2023 1056   EOSABS 0.1 07/10/2023 1056   EOSABS 0.1 08/18/2020 1415   BASOSABS 0.0 07/10/2023 1056   BASOSABS 0.0 08/18/2020 1415   Recent Labs    11/24/22 1224 11/25/22 0011 11/25/22 0026 11/26/22 0352 11/30/22 1147 02/09/23 1136 03/09/23 1240 04/10/23 1314 07/10/23 1056  HGB 9.4* 9.3* 9.9* 8.2* 10.9* 11.2* 11.2* 11.4* 10.3*     CMP     Component Value Date/Time   NA 140 03/09/2023 1240   K 4.0 03/09/2023 1240   CL 106 03/09/2023 1240   CO2 25 03/09/2023 1240   GLUCOSE 80 03/09/2023 1240   BUN 16 03/09/2023 1240   CREATININE 0.74 03/09/2023 1240   CREATININE 0.83 02/09/2023 1136   CREATININE 0.71 03/31/2020 1108   CALCIUM 9.5 03/09/2023 1240   PROT 7.8 03/09/2023 1240   ALBUMIN 4.2 03/09/2023 1240   AST 16 03/09/2023 1240   AST 17 02/09/2023 1136   ALT 10 03/09/2023 1240   ALT 12 02/09/2023 1136   ALKPHOS 54 03/09/2023 1240   BILITOT 0.3 03/09/2023 1240   BILITOT 0.3 02/09/2023 1136   GFRNONAA >60 02/09/2023 1136   GFRNONAA >89 07/22/2013 1155   GFRAA 37 (L) 09/21/2019 0330   GFRAA >89 07/22/2013 1155      Latest Ref Rng & Units 03/09/2023   12:40 PM 02/09/2023   11:36 AM 11/30/2022   11:47 AM  Hepatic Function  Total Protein 6.0 - 8.3 g/dL 7.8  7.7  7.7   Albumin 3.5 - 5.2 g/dL 4.2  4.3  3.8   AST 0 - 37 U/L 16  17  13    ALT 0 - 35 U/L 10  12  14    Alk Phosphatase 39 - 117 U/L 54  61  74   Total Bilirubin 0.2 - 1.2 mg/dL 0.3  0.3  0.3       Current Medications:   Current Outpatient Medications (Endocrine & Metabolic):    predniSONE (DELTASONE) 2.5 MG tablet, Take 1 tablet by mouth once daily with breakfast  Current Facility-Administered Medications (Endocrine & Metabolic):    denosumab (PROLIA) injection 60 mg  Current Outpatient Medications (Cardiovascular):    atorvastatin (LIPITOR) 10 MG tablet, Take 1 tablet by mouth once daily   metoprolol succinate (TOPROL-XL) 25 MG 24 hr tablet, Take 1 tablet (25 mg total) by mouth daily.   Current Outpatient Medications (Respiratory):    albuterol (VENTOLIN HFA) 108 (90 Base) MCG/ACT inhaler, INHALE ONE TO TWO PUFFS INTO THE LUNGS EVERY 6 HOURS AS NEEDED FOR WHEEZING OR SHORTNESS OF BREATH   budesonide-formoterol (SYMBICORT) 160-4.5 MCG/ACT inhaler, Inhale 2 puffs into the lungs 2 (two) times daily.   fluticasone (FLONASE) 50 MCG/ACT nasal  spray, Place 2 sprays into both nostrils daily.   levocetirizine (XYZAL) 5 MG tablet, Take 1 tablet (5 mg total) by mouth every evening.   montelukast (SINGULAIR) 10 MG tablet, TAKE 1 TABLET BY MOUTH AT BEDTIME   Pseudoephedrine-Guaifenesin (MUCINEX D MAX STRENGTH) [phone] MG TB12, Take 1 tablet by mouth 2 (two) times daily.   benzonatate (TESSALON) 100 MG capsule, Take 1 capsule (100 mg total) by mouth 3 (three) times daily as needed. (Patient not taking: Reported on 08/24/2023)   Current Outpatient Medications (Analgesics):    HYDROcodone-acetaminophen (NORCO/VICODIN) 5-325 MG tablet, Take 1 tablet by mouth every 6 (six) hours as needed for moderate pain (pain score 4-6) or severe pain (pain score 7-10).   meloxicam (MOBIC) 7.5 MG tablet, TAKE 1 TO 2 TABLETS BY MOUTH ONCE DAILY AS NEEDED FOR PAIN   Current Outpatient Medications (Hematological):    Ferrous Fumarate-Folic Acid (HEMATINIC/FOLIC ACID) 324-1 MG TABS, Take 1 tablet by mouth daily.   Current Outpatient Medications (Other):    calcium-vitamin D (OSCAL WITH D) 500-5 MG-MCG tablet, Take 1 tablet by mouth daily with breakfast.   famotidine (PEPCID) 40 MG tablet, TAKE 1 TABLET BY MOUTH EVERY DAY   gabapentin (NEURONTIN) 300 MG capsule, Take 1 capsule (300 mg total) by mouth 2 (two) times daily as needed.   pantoprazole (PROTONIX) 20 MG tablet, Take 1 tablet (20 mg total) by mouth daily before breakfast.   Sodium Sulfate-Mag Sulfate-KCl (SUTAB) [phone] MG TABS, Use as directed for colonoscopy. MANUFACTURER CODES!! BIN: [id number] PCN: CN GROUP: [id number] MEMBER ID: [id number];V[employer] AS SECONDARY INSURANCE ;NO PRIOR AUTHORIZATION   Medical History:  Past Medical History:  Diagnosis Date   Allergy    Asthma    Atypical chest pain    Chronic sinusitis    Followed by Dr. Jenne Pane   Environmental allergies    GERD (gastroesophageal reflux disease)    High serum parathyroid hormone (PTH) 06/09/2016   History of chicken pox     Hypercalcemia 07/19/2015   Hypertension    Osteoporosis, unspecified    steroid induced   Personal history of other diseases of circulatory system    Rheumatoid arthritis(714.0)    Unspecified deficiency anemia    microcytic   Urticaria    Allergies:  Allergies  Allergen Reactions   Avelox [Moxifloxacin Hcl In Nacl]  Diarrhea, Nausea And Vomiting and Other (See Comments)    Dizziness, Cold chills    Bactrim [Sulfamethoxazole-Trimethoprim] Hives   Doxycycline     Abdominal discomfort, anorexia   Sulfamethoxazole-Trimethoprim Hives   Augmentin [Amoxicillin-Pot Clavulanate] Hives, Itching and Swelling   Amoxicillin Other (See Comments)     Surgical History:  She  has a past surgical history that includes Nasal sinus surgery (06/2012); Nasal sinus surgery; and Open reduction internal fixation (orif) metacarpal (Right, 07/13/2022). Family History:  Her family history includes Alzheimer's disease in her paternal grandfather; Arthritis in her cousin, daughter, father, and sister; Cancer in her father; Coronary artery disease in her paternal grandmother; Diabetes in her father; Heart attack in her maternal grandmother; Heart disease in her brother, father, mother, and sister; Hyperlipidemia in her son; Hypertension in her maternal grandmother and paternal grandmother; Kidney disease in her mother; Liver cancer in her maternal grandmother and paternal grandmother; Lung cancer in her paternal uncle; Lupus in her cousin; Other in her mother and sister; Prostate cancer in her father; Scleroderma in her mother; Sleep apnea in her brother.  REVIEW OF SYSTEMS  : All other systems reviewed and negative except where noted in the History of Present Illness.  PHYSICAL EXAM: BP 116/68 (BP Location: Right Arm, Patient Position: Sitting, Cuff Size: Normal)   Pulse 84   Ht 5\' 1"  (1.549 m)   Wt 108 lb 6.4 oz (49.2 kg)   BMI 20.48 kg/m  Physical Exam   GENERAL APPEARANCE: Well nourished, in no apparent  distress HEENT: No cervical lymphadenopathy, unremarkable thyroid, sclerae anicteric, conjunctiva pink, poor dentition RESPIRATORY: Respiratory effort normal, breath sounds clear to auscultation bilaterally without rales, rhonchi, or wheezing CARDIO: Regular rate and rhythm with a minor holosystolic murmur, peripheral pulses intact ABDOMEN: Soft, non-distended, active bowel sounds in all 4 quadrants, non-tender to palpation, no rebound, no mass appreciated RECTAL: Decreased rectal tone, no rectal masses, stool dark, stool hemoccult positive, anterior hemorrhoidal skin tag present MUSCULOSKELETAL: Full range of motion, normal gait, without edema SKIN: Dry, intact without rashes or lesions. No jaundice. NEURO: Alert, oriented, no focal deficits PSYCH: Cooperative, normal mood and affect. NECK: No cervical lymphadenopathy, thyroid normal EXTREMITIES: No edema in lower extremities      Doree Albee, PA-C 12:07 PM

## 2023-08-24 ENCOUNTER — Encounter: Payer: Self-pay | Admitting: Physician Assistant

## 2023-08-24 ENCOUNTER — Other Ambulatory Visit

## 2023-08-24 ENCOUNTER — Ambulatory Visit: Payer: 59 | Admitting: Physician Assistant

## 2023-08-24 VITALS — BP 116/68 | HR 84 | Ht 61.0 in | Wt 108.4 lb

## 2023-08-24 DIAGNOSIS — R159 Full incontinence of feces: Secondary | ICD-10-CM | POA: Diagnosis not present

## 2023-08-24 DIAGNOSIS — E538 Deficiency of other specified B group vitamins: Secondary | ICD-10-CM | POA: Diagnosis not present

## 2023-08-24 DIAGNOSIS — D509 Iron deficiency anemia, unspecified: Secondary | ICD-10-CM

## 2023-08-24 DIAGNOSIS — K625 Hemorrhage of anus and rectum: Secondary | ICD-10-CM | POA: Diagnosis not present

## 2023-08-24 DIAGNOSIS — K219 Gastro-esophageal reflux disease without esophagitis: Secondary | ICD-10-CM

## 2023-08-24 DIAGNOSIS — J471 Bronchiectasis with (acute) exacerbation: Secondary | ICD-10-CM

## 2023-08-24 DIAGNOSIS — M0510 Rheumatoid lung disease with rheumatoid arthritis of unspecified site: Secondary | ICD-10-CM

## 2023-08-24 DIAGNOSIS — K921 Melena: Secondary | ICD-10-CM

## 2023-08-24 DIAGNOSIS — K6289 Other specified diseases of anus and rectum: Secondary | ICD-10-CM

## 2023-08-24 DIAGNOSIS — R32 Unspecified urinary incontinence: Secondary | ICD-10-CM

## 2023-08-24 LAB — COMPREHENSIVE METABOLIC PANEL
ALT: 14 U/L (ref 0–35)
AST: 19 U/L (ref 0–37)
Albumin: 4.4 g/dL (ref 3.5–5.2)
Alkaline Phosphatase: 63 U/L (ref 39–117)
BUN: 19 mg/dL (ref 6–23)
CO2: 27 meq/L (ref 19–32)
Calcium: 10.2 mg/dL (ref 8.4–10.5)
Chloride: 103 meq/L (ref 96–112)
Creatinine, Ser: 0.78 mg/dL (ref 0.40–1.20)
GFR: 79.97 mL/min (ref >60.00)
Glucose, Bld: 96 mg/dL (ref 70–99)
Potassium: 4 meq/L (ref 3.5–5.1)
Sodium: 139 meq/L (ref 135–145)
Total Bilirubin: 0.2 mg/dL (ref 0.2–1.2)
Total Protein: 8.1 g/dL (ref 6.0–8.3)

## 2023-08-24 LAB — CBC WITH DIFFERENTIAL/PLATELET
Basophils Absolute: 0 10*3/uL (ref 0.0–0.1)
Basophils Relative: 0.8 % (ref 0.0–3.0)
Eosinophils Absolute: 0.1 10*3/uL (ref 0.0–0.7)
Eosinophils Relative: 1.6 % (ref 0.0–5.0)
HCT: 36.2 % (ref 36.0–46.0)
Hemoglobin: 11.4 g/dL — ABNORMAL LOW (ref 12.0–15.0)
Lymphocytes Relative: 20.5 % (ref 12.0–46.0)
Lymphs Abs: 0.9 10*3/uL (ref 0.7–4.0)
MCHC: 31.5 g/dL (ref 30.0–36.0)
MCV: 74.8 fl — ABNORMAL LOW (ref 78.0–100.0)
Monocytes Absolute: 0.4 10*3/uL (ref 0.1–1.0)
Monocytes Relative: 9.2 % (ref 3.0–12.0)
Neutro Abs: 2.9 10*3/uL (ref 1.4–7.7)
Neutrophils Relative %: 67.9 % (ref 43.0–77.0)
Platelets: 165 10*3/uL (ref 150.0–400.0)
RBC: 4.83 Mil/uL (ref 3.87–5.11)
RDW: 15.2 % (ref 11.5–15.5)
WBC: 4.3 10*3/uL (ref 4.0–10.5)

## 2023-08-24 LAB — IBC + FERRITIN
Ferritin: 983 ng/mL — ABNORMAL HIGH (ref 10.0–291.0)
Iron: 75 ug/dL (ref 42–145)
Saturation Ratios: 26.4 % (ref 20.0–50.0)
TIBC: 284.2 ug/dL (ref 250.0–450.0)
Transferrin: 203 mg/dL — ABNORMAL LOW (ref 212.0–360.0)

## 2023-08-24 MED ORDER — PANTOPRAZOLE SODIUM 20 MG PO TBEC: 20.0000 mg | DELAYED_RELEASE_TABLET | Freq: Every day | ORAL | 3 refills | Status: AC

## 2023-08-24 MED ORDER — SUTAB 1479-225-188 MG PO TABS: ORAL_TABLET | ORAL | 0 refills | Status: DC

## 2023-08-24 NOTE — Patient Instructions (Addendum)
 You have been scheduled for an endoscopy and colonoscopy. Please follow the written instructions given to you at your visit today.  If you use inhalers (even only as needed), please bring them with you on the day of your procedure.  DO NOT TAKE 7 DAYS PRIOR TO TEST- Trulicity (dulaglutide) Ozempic, Wegovy (semaglutide) Mounjaro (tirzepatide) Bydureon Bcise (exanatide extended release)  DO NOT TAKE 1 DAY PRIOR TO YOUR TEST Rybelsus (semaglutide) Adlyxin (lixisenatide) Victoza (liraglutide) Byetta (exanatide) ___________________________________________________________________________  Jamie Cox will receive your bowel preparation through [facility], which ensures the lowest copay and home delivery, with outreach via text or call from an [phone] number. Please respond promptly to avoid rescheduling of your procedure. If you are interested in alternative options or have any questions regarding your prep, please contact them at [phone] ____________________________________________________________________________  Your Provider Has Sent Your Bowel Prep Regimen To [facility]   [facility] will contact you to verify your information and collect your copay, if applicable. Enjoy the comfort of your home while your prescription is mailed to you, FREE of any shipping charges.   [facility] accepts all major insurance benefits and applies discounts & coupons.  Have additional questions?   Chat: [email or web address] Call: [phone] Email: [email or web address] NCPDP: [id number]  How will [facility] contact you?  With a Welcome phone call,  a Welcome text and a checkout link in text form.  Texts you receive from [phone] Are NOT Spam.  *To set up delivery, you must complete the checkout process via link or speak to one of the patient care representatives. If [facility] is unable to reach you, your prescription may be delayed.  To avoid long hold times on the phone, you may also  utilize the secure chat feature on the [facility] website to request that they call you back for transaction completion or to expedite your concerns.   Thank you for trusting me with your gastrointestinal care!  Quentin Mulling, PA-C  _______________________________________________________  If your blood pressure at your visit was 140/90 or greater, please contact your primary care physician to follow up on this.  _______________________________________________________  If you are age 38 or older, your body mass index should be between 23-30. Your Body mass index is 20.48 kg/m. If this is out of the aforementioned range listed, please consider follow up with your Primary Care Provider.  If you are age 29 or younger, your body mass index should be between 19-25. Your Body mass index is 20.48 kg/m. If this is out of the aformentioned range listed, please consider follow up with your Primary Care Provider.   ________________________________________________________  The [facility] providers would like to encourage you to use [facility] to communicate with providers for non-urgent requests or questions.  Due to long hold times on the telephone, sending your provider a message by [facility] may be a faster and more efficient way to get a response.  Please allow 48 business hours for a response.  Please remember that this is for non-urgent requests.  _______________________________________________________    FIBER SUPPLEMENT You can do metamucil or fibercon once or twice a day but if this causes gas/bloating please switch to Benefiber or Citracel.  Fiber is good for constipation/diarrhea/irritable bowel syndrome.  It can also help with weight loss and can help lower your bad cholesterol (LDL).  Please do 1 TBSP in the morning in water, coffee, or tea.  It can take up to a month before you can see a difference with your bowel movements.  It is cheapest from [employer], [employer],  [employer].   Advised to go to  the ER if there is any severe weakness, severe abdominal pain, vomit blood, dark red blood in your bowel movement, shortness of breath or chest pain.    Please take your proton pump inhibitor medication, pantoprazole 20 mg with the pepcid  Please take this medication 30 minutes to 1 hour before meals- this makes it more effective.  Avoid spicy and acidic foods Avoid fatty foods Limit your intake of coffee, tea, alcohol, and carbonated drinks Work to maintain a healthy weight Keep the head of the bed elevated at least 3 inches with blocks or a wedge pillow if you are having any nighttime symptoms Stay upright for 2 hours after eating Avoid meals and snacks three to four hours before bedtime   Here some information about pelvic floor dysfunction. This may be contributing to some of your symptoms. We will continue with our evaluation but I do want you to consider adding on fiber supplement with low-dose MiraLAX daily. We could also refer to pelvic floor physical therapy.   Pelvic Floor Dysfunction, Female Pelvic floor dysfunction (PFD) is a condition that results when the group of muscles and connective tissues that support the organs in the pelvis (pelvic floor muscles) do not work well. These muscles and their connections form a sling that supports the colon and bladder. In women, they also support the uterus. PFD causes pelvic floor muscles to be too weak, too tight, or both. In PFD, muscle movements are not coordinated. This may cause bowel or bladder problems. It may also cause pain. What are the causes? This condition may be caused by an injury to the pelvic area or by a weakening of pelvic muscles. This often results from pregnancy and childbirth or other types of strain. In many cases, the exact cause is not known. What increases the risk? The following factors may make you more likely to develop this condition: Having chronic bladder tissue inflammation (interstitial  cystitis). Being an older person. Being overweight. History of radiation treatment for cancer in the pelvic region. Previous pelvic surgery, such as removal of the uterus (hysterectomy). What are the signs or symptoms? Symptoms of this condition vary and may include: Bladder symptoms, such as: Trouble starting urination and emptying the bladder. Frequent urinary tract infections. Leaking urine when coughing, laughing, or exercising (stress incontinence). Having to pass urine urgently or frequently. Pain when passing urine. Bowel symptoms, such as: Constipation. Urgent or frequent bowel movements. Incomplete bowel movements. Painful bowel movements. Leaking stool or gas. Unexplained genital or rectal pain. Genital or rectal muscle spasms. Low back pain. Other symptoms may include: A heavy, full, or aching feeling in the vagina. A bulge that protrudes into the vagina. Pain during or after sex. How is this diagnosed? This condition may be diagnosed based on: Your symptoms and medical history. A physical exam. During the exam, your health care provider may check your pelvic muscles for tightness, spasm, pain, or weakness. This may include a rectal exam and a pelvic exam. In some cases, you may have diagnostic tests, such as: Electrical muscle function tests. Urine flow testing. X-ray tests of bowel function. Ultrasound of the pelvic organs. How is this treated? Treatment for this condition depends on the symptoms. Treatment options include: Physical therapy. This may include Kegel exercises to help relax or strengthen the pelvic floor muscles. Biofeedback. This type of therapy provides feedback on how tight your pelvic floor muscles are so that you can learn to control them.  Internal or external massage therapy. A treatment that involves electrical stimulation of the pelvic floor muscles to help control pain (transcutaneous electrical nerve stimulation, or TENS). Sound wave  therapy (ultrasound) to reduce muscle spasms. Medicines, such as: Muscle relaxants. Bladder control medicines. Surgery to reconstruct or support pelvic floor muscles may be an option if other treatments do not help. Follow these instructions at home: Activity Do your usual activities as told by your health care provider. Ask your health care provider if you should modify any activities. Do pelvic floor strengthening or relaxing exercises at home as told by your physical therapist. Lifestyle Maintain a healthy weight. Eat foods that are high in fiber, such as beans, whole grains, and fresh fruits and vegetables. Limit foods that are high in fat and processed sugars, such as fried or sweet foods. Manage stress with relaxation techniques such as yoga or meditation. General instructions If you have problems with leakage: Use absorbable pads or wear padded underwear. Wash frequently with mild soap. Keep your genital and anal area as clean and dry as possible. Ask your health care provider if you should try a barrier cream to prevent skin irritation. Take warm baths to relieve pelvic muscle tension or spasms. Take over-the-counter and prescription medicines only as told by your health care provider. Keep all follow-up visits. How is this prevented? The cause of PFD is not always known, but there are a few things you can do to reduce the risk of developing this condition, including: Staying at a healthy weight. Getting regular exercise. Managing stress. Contact a health care provider if: Your symptoms are not improving with home care. You have signs or symptoms of PFD that get worse at home. You develop new signs or symptoms. You have signs of a urinary tract infection, such as: Fever. Chills. Increased urinary frequency. A burning feeling when urinating. You have not had a bowel movement in 3 days (constipation). Summary Pelvic floor dysfunction results when the muscles and  connective tissues in your pelvic floor do not work well. These muscles and their connections form a sling that supports your colon and bladder. In women, they also support the uterus. PFD may be caused by an injury to the pelvic area or by a weakening of pelvic muscles. PFD causes pelvic floor muscles to be too weak, too tight, or a combination of both. Symptoms may vary from person to person. In most cases, PFD can be treated with physical therapies and medicines. Surgery may be an option if other treatments do not help. This information is not intended to replace advice given to you by your health care provider. Make sure you discuss any questions you have with your health care provider. Document Revised: 09/30/2020 Document Reviewed: 09/30/2020 Elsevier Patient Education  2022 [employer].

## 2023-08-28 NOTE — Progress Notes (Signed)
 ____________________________________________________________  Attending physician addendum:  Thank you for sending this case to me. I have reviewed the entire note and agree with the plan.  Her hemoglobin is back up to 11.4, similar to what it was about 6 months ago with some fluctuation in the interim. I agree with you that her chronic inflammatory condition seems likely to be contributing to this microcytic anemia that persists despite normalization of iron level with a markedly elevated ferritin since she started iron supplementation last year. Endoscopic evaluation definitely warranted, and video capsule study may be needed depending on EGD and colonoscopy results.  Amada Jupiter, MD  ____________________________________________________________

## 2023-09-12 ENCOUNTER — Ambulatory Visit (INDEPENDENT_AMBULATORY_CARE_PROVIDER_SITE_OTHER)

## 2023-09-12 DIAGNOSIS — E538 Deficiency of other specified B group vitamins: Secondary | ICD-10-CM | POA: Diagnosis not present

## 2023-09-12 MED ORDER — CYANOCOBALAMIN 1000 MCG/ML IJ SOLN
1000.0000 ug | Freq: Once | INTRAMUSCULAR | Status: AC
  Administered 2023-09-12: 1000 ug via INTRAMUSCULAR

## 2023-09-12 NOTE — Progress Notes (Signed)
 Jamie Cox is a 63 y.o. female presents to the office today for Monthly B12 injection, per physician's orders. Original order: Continue monthly vitamin B12 injections - Dr Arbutus Ped 07/10/23 Cyanocobalamin 1000 mg/ml IM was administered L Deltoid today. Patient tolerated injection. Patient due for follow up labs/provider appt: appt pending on 10/09/23 Patient next injection due: 1 month, appt made: will get at pending 10/09/23 appt  DOD: Laury Axon   Creft, Feliberto Harts

## 2023-09-20 ENCOUNTER — Encounter: Payer: Self-pay | Admitting: Gastroenterology

## 2023-09-25 ENCOUNTER — Telehealth: Payer: Self-pay | Admitting: Physician Assistant

## 2023-09-25 NOTE — Telephone Encounter (Signed)
 Inbound call from patient, states she has a procedure on 4/24/ at 1:00 PM. Patient states she took meloxicam  on Saturday. She would like to know if she can continue with procedure.

## 2023-09-26 ENCOUNTER — Encounter: Payer: Self-pay | Admitting: Certified Registered Nurse Anesthetist

## 2023-09-26 NOTE — Telephone Encounter (Signed)
 Patient returning call. Please advise

## 2023-09-26 NOTE — Telephone Encounter (Signed)
 Patient is calling per information below. Patient is requesting a call back. Please advise.

## 2023-09-27 NOTE — Telephone Encounter (Signed)
 Patient was advised, Per Natalie, she can still come in for procedure.

## 2023-09-28 ENCOUNTER — Encounter: Payer: Self-pay | Admitting: Gastroenterology

## 2023-09-28 ENCOUNTER — Ambulatory Visit (AMBULATORY_SURGERY_CENTER): Admitting: Gastroenterology

## 2023-09-28 VITALS — BP 135/83 | HR 96 | Temp 98.2°F | Resp 17 | Ht 61.0 in | Wt 108.0 lb

## 2023-09-28 DIAGNOSIS — K2950 Unspecified chronic gastritis without bleeding: Secondary | ICD-10-CM

## 2023-09-28 DIAGNOSIS — K449 Diaphragmatic hernia without obstruction or gangrene: Secondary | ICD-10-CM

## 2023-09-28 DIAGNOSIS — D509 Iron deficiency anemia, unspecified: Secondary | ICD-10-CM | POA: Diagnosis not present

## 2023-09-28 DIAGNOSIS — R195 Other fecal abnormalities: Secondary | ICD-10-CM

## 2023-09-28 DIAGNOSIS — K3189 Other diseases of stomach and duodenum: Secondary | ICD-10-CM

## 2023-09-28 DIAGNOSIS — K635 Polyp of colon: Secondary | ICD-10-CM | POA: Diagnosis not present

## 2023-09-28 DIAGNOSIS — Q438 Other specified congenital malformations of intestine: Secondary | ICD-10-CM

## 2023-09-28 DIAGNOSIS — K625 Hemorrhage of anus and rectum: Secondary | ICD-10-CM | POA: Diagnosis not present

## 2023-09-28 DIAGNOSIS — D124 Benign neoplasm of descending colon: Secondary | ICD-10-CM

## 2023-09-28 MED ORDER — SODIUM CHLORIDE 0.9 % IV SOLN: 500.0000 mL | Freq: Once | INTRAVENOUS | Status: DC

## 2023-09-28 NOTE — Progress Notes (Signed)
 Called to room to assist during endoscopic procedure.  Patient ID and intended procedure confirmed with present staff. Received instructions for my participation in the procedure from the performing physician.

## 2023-09-28 NOTE — Patient Instructions (Signed)
 Handouts Provided:  Polyps  YOU HAD AN ENDOSCOPIC PROCEDURE TODAY AT THE [facility]:   Refer to the procedure report that was given to you for any specific questions about what was found during the examination.  If the procedure report does not answer your questions, please call your gastroenterologist to clarify.  If you requested that your care partner not be given the details of your procedure findings, then the procedure report has been included in a sealed envelope for you to review at your convenience later.  YOU SHOULD EXPECT: Some feelings of bloating in the abdomen. Passage of more gas than usual.  Walking can help get rid of the air that was put into your GI tract during the procedure and reduce the bloating. If you had a lower endoscopy (such as a colonoscopy or flexible sigmoidoscopy) you may notice spotting of blood in your stool or on the toilet paper. If you underwent a bowel prep for your procedure, you may not have a normal bowel movement for a few days.  Please Note:  You might notice some irritation and congestion in your nose or some drainage.  This is from the oxygen used during your procedure.  There is no need for concern and it should clear up in a day or so.  SYMPTOMS TO REPORT IMMEDIATELY:  Following lower endoscopy (colonoscopy or flexible sigmoidoscopy):  Excessive amounts of blood in the stool  Significant tenderness or worsening of abdominal pains  Swelling of the abdomen that is new, acute  Fever of 100F or higher  Following upper endoscopy (EGD)  Vomiting of blood or coffee ground material  New chest pain or pain under the shoulder blades  Painful or persistently difficult swallowing  New shortness of breath  Fever of 100F or higher  Black, tarry-looking stools  For urgent or emergent issues, a gastroenterologist can be reached at any hour by calling [phone]. Do not use MyChart messaging for urgent concerns.    DIET:  We do recommend  a small meal at first, but then you may proceed to your regular diet.  Drink plenty of fluids but you should avoid alcoholic beverages for 24 hours.  ACTIVITY:  You should plan to take it easy for the rest of today and you should NOT DRIVE or use heavy machinery until tomorrow (because of the sedation medicines used during the test).    FOLLOW UP: Our staff will call the number listed on your records the next business day following your procedure.  We will call around 7:15- 8:00 am to check on you and address any questions or concerns that you may have regarding the information given to you following your procedure. If we do not reach you, we will leave a message.     If any biopsies were taken you will be contacted by phone or by letter within the next 1-3 weeks.  Please call us at [phone] if you have not heard about the biopsies in 3 weeks.    SIGNATURES/CONFIDENTIALITY: You and/or your care partner have signed paperwork which will be entered into your electronic medical record.  These signatures attest to the fact that that the information above on your After Visit Summary has been reviewed and is understood.  Full responsibility of the confidentiality of this discharge information lies with you and/or your care-partner.

## 2023-09-28 NOTE — Progress Notes (Signed)
1446 Robinul 0.1 mg IV given due large amount of secretions upon assessment.  MD made aware, vss  

## 2023-09-28 NOTE — Progress Notes (Signed)
 History and Physical:  This patient presents for endoscopic testing for: Encounter Diagnoses  Name Primary?   Iron deficiency anemia, unspecified iron deficiency anemia type Yes   Rectal bleeding    Heme positive stool     This is a 63 year old woman known to our clinic from prior evaluations, most recently with APP on 08/24/2023 with clinical details outlined in that note.  She has chronic iron deficiency anemia with a probable component of anemia chronic inflammatory disease (rheumatoid arthritis).  She described blood in the stool, was heme positive during the office visit, and is thus undergoing endoscopic evaluation for source of anemia and bleeding. No reported significant clinical changes since the recent office visit. Patient is otherwise without complaints or active issues today.   Past Medical History: Past Medical History:  Diagnosis Date   Allergy    Asthma    Atypical chest pain    Chronic sinusitis    Followed by Dr. 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   History of chicken pox    Hypercalcemia 07/19/2015   Hypertension    Osteoporosis, unspecified    steroid induced   Personal history of other diseases of circulatory system    Rheumatoid arthritis(714.0)    Unspecified deficiency anemia    microcytic   Urticaria      Past Surgical History: Past Surgical History:  Procedure Laterality Date   NASAL SINUS SURGERY  06/2012   NASAL SINUS SURGERY     2010   OPEN REDUCTION INTERNAL FIXATION (ORIF) METACARPAL Right 07/13/2022   Procedure: OPEN REDUCTION INTERNAL FIXATION METACARPAL RIGHT SMALL FINGER;  Surgeon: Arvil Birks, MD;  Location: [facility] OR;  Service: Orthopedics;  Laterality: Right;  regional with iv sedation    Allergies: Allergies  Allergen Reactions   Augmentin  [Amoxicillin -Pot Clavulanate] Hives, Itching and Swelling   Avelox [Moxifloxacin Hcl In Nacl] Diarrhea,  Nausea And Vomiting and Other (See Comments)    Dizziness, Cold chills    Bactrim  [Sulfamethoxazole -Trimethoprim ] Hives   Doxycycline      Abdominal discomfort, anorexia   Sulfamethoxazole -Trimethoprim  Hives   Amoxicillin  Other (See Comments)    Outpatient Meds: Current Outpatient Medications  Medication Sig Dispense Refill   albuterol  (VENTOLIN  HFA) 108 (90 Base) MCG/ACT inhaler INHALE ONE TO TWO PUFFS INTO THE LUNGS EVERY 6 HOURS AS NEEDED FOR WHEEZING OR SHORTNESS OF BREATH 18 each 5   atorvastatin  (LIPITOR) 10 MG tablet Take 1 tablet by mouth once daily 90 tablet 0   calcium -vitamin D  (OSCAL WITH D) 500-5 MG-MCG tablet Take 1 tablet by mouth daily with breakfast.     famotidine  (PEPCID ) 40 MG tablet TAKE 1 TABLET BY MOUTH EVERY DAY 90 tablet 1   Ferrous Fumarate -Folic Acid  (HEMATINIC/FOLIC ACID ) 324-1 MG TABS Take 1 tablet by mouth daily. 90 tablet 1   fluticasone  (FLONASE ) 50 MCG/ACT nasal spray Place 2 sprays into both nostrils daily. 16 g 5   levocetirizine (XYZAL ) 5 MG tablet Take 1 tablet (5 mg total) by mouth every evening. 30 tablet 4   metoprolol  succinate (TOPROL -XL) 25 MG 24 hr tablet Take 1 tablet (25 mg total) by mouth daily. 90 tablet 1   montelukast  (SINGULAIR ) 10 MG tablet TAKE 1 TABLET BY MOUTH AT BEDTIME 90 tablet 1   Pseudoephedrine-Guaifenesin  (MUCINEX  D MAX STRENGTH) [phone] MG TB12 Take 1 tablet by mouth 2 (two) times daily.     benzonatate  (TESSALON ) 100 MG capsule Take 1  capsule (100 mg total) by mouth 3 (three) times daily as needed. (Patient not taking: Reported on 08/24/2023) 20 capsule 0   budesonide -formoterol  (SYMBICORT ) 160-4.5 MCG/ACT inhaler Inhale 2 puffs into the lungs 2 (two) times daily. 10.2 g 12   gabapentin  (NEURONTIN ) 300 MG capsule Take 1 capsule (300 mg total) by mouth 2 (two) times daily as needed. 180 capsule 1   HYDROcodone -acetaminophen  (NORCO/VICODIN) 5-325 MG tablet Take 1 tablet by mouth every 6 (six) hours as needed for moderate pain (pain  score 4-6) or severe pain (pain score 7-10). 40 tablet 0   meloxicam  (MOBIC ) 7.5 MG tablet TAKE 1 TO 2 TABLETS BY MOUTH ONCE DAILY AS NEEDED FOR PAIN 60 tablet 03   pantoprazole  (PROTONIX ) 20 MG tablet Take 1 tablet (20 mg total) by mouth daily before breakfast. (Patient not taking: Reported on 09/28/2023) 90 tablet 3   predniSONE  (DELTASONE ) 2.5 MG tablet Take 1 tablet by mouth once daily with breakfast 90 tablet 0   Current Facility-Administered Medications  Medication Dose Route Frequency Provider Last Rate Last Admin   0.9 %  sodium chloride  infusion  500 mL Intravenous Once Danis, Coury Grieger L III, MD       denosumab  (PROLIA ) injection 60 mg  60 mg Subcutaneous Once Blyth, Stacey A, MD          ___________________________________________________________________ Objective   Exam:  BP 132/75   Pulse 95   Temp 98.2 F (36.8 C)   Ht 5\' 1"  (1.549 m)   Wt 108 lb (49 kg)   SpO2 99%   BMI 20.41 kg/m   CV: regular , S1/S2 Resp: clear to auscultation bilaterally, normal RR and effort noted GI: soft, no tenderness, with active bowel sounds.   Assessment: Encounter Diagnoses  Name Primary?   Iron deficiency anemia, unspecified iron deficiency anemia type Yes   Rectal bleeding    Heme positive stool      Plan: Colonoscopy EGD  The benefits and risks of the planned procedure(s) were described in detail with the patient or (when appropriate) their health care proxy.  Risks were outlined as including, but not limited to, bleeding, infection, perforation, adverse medication reaction leading to cardiac or pulmonary decompensation, pancreatitis (if ERCP).  The limitation of incomplete mucosal visualization was also discussed.  No guarantees or warranties were given.  The patient is appropriate for an endoscopic procedure in the ambulatory setting.   - Lorella Roles, MD

## 2023-09-28 NOTE — Op Note (Signed)
 [facility] Patient Name: Jamie Cox Procedure Date: 09/28/2023 2:02 PM MRN: [id number] Endoscopist: Ace Abu L. Dominic Friendly , MD, [id number] Age: 63 Referring MD:  Date of Birth: July 23, 1960 Gender: Female Account #: [id number] Procedure:                Upper GI endoscopy Indications:              Iron deficiency anemia secondary to chronic blood                            loss, Heme positive stool                           There is also a possible chronic inflammatory                            disease component to the anemia (rheumatoid                            arthritis and lung disease) Medicines:                Monitored Anesthesia Care Procedure:                Pre-Anesthesia Assessment:                           - Prior to the procedure, a History and Physical                            was performed, and patient medications and                            allergies were reviewed. The patient's tolerance of                            previous anesthesia was also reviewed. The risks                            and benefits of the procedure and the sedation                            options and risks were discussed with the patient.                            All questions were answered, and informed consent                            was obtained. Prior Anticoagulants: The patient has                            taken no anticoagulant or antiplatelet agents. ASA                            Grade Assessment: II - A patient with mild systemic  disease. After reviewing the risks and benefits,                            the patient was deemed in satisfactory condition to                            undergo the procedure.                           After obtaining informed consent, the endoscope was                            passed under direct vision. Throughout the                            procedure, the patient's blood pressure, pulse, and                             oxygen saturations were monitored continuously. The                            Olympus Scope [id number] was introduced through the                            mouth, and advanced to the second part of duodenum.                            The upper GI endoscopy was accomplished without                            difficulty. The patient tolerated the procedure                            well. Scope In: Scope Out: Findings:                 The larynx was normal.                           A small (1 to 2 cm) sliding hiatal hernia was                            present. No stricture or esophageal tortuosity seen.                           Localized erythematous mucosa was found on the                            anterior wall of the stomach. Biopsies were taken                            with a cold forceps for histology. (Biopsies                            labeled "gastric body")  The exam of the stomach was otherwise normal.                            Antral biopsies obtained to rule out H. pylori                            given the gastric body mucosal findings (biopsies                            labeled "gastric antrum")                           The cardia and gastric fundus were normal on                            retroflexion.                           Normal mucosa was found in the entire duodenum.                            Biopsies for histology were taken with a cold                            forceps for evaluation of celiac disease. Complications:            No immediate complications. Estimated Blood Loss:     Estimated blood loss was minimal. Impression:               - Normal larynx.                           - Small hiatal hernia.                           - Erythematous mucosa in the anterior wall of the                            stomach. Biopsied.                           - Normal mucosa was found in the entire examined                             duodenum. Biopsied. Recommendation:           - Patient has a contact number available for                            emergencies. The signs and symptoms of potential                            delayed complications were discussed with the                            patient. Return to normal activities tomorrow.  Written discharge instructions were provided to the                            patient.                           - Resume previous diet.                           - Continue present medications.                           - Await pathology results.                           - See the other procedure note for documentation of                            additional recommendations. [name] L. Dominic Friendly, MD 09/28/2023 3:30:11 PM This report has been signed electronically.

## 2023-09-28 NOTE — Op Note (Signed)
 [facility] Patient Name: Jamie Cox Procedure Date: 09/28/2023 2:23 PM MRN: [id number] Endoscopist: Ace Abu L. Dominic Friendly , MD, [id number] Age: 63 Referring MD:  Date of Birth: 23-Dec-1960 Gender: Female Account #: [id number] Procedure:                Colonoscopy Indications:              Heme positive stool, Rectal bleeding, Unexplained                            iron deficiency anemia                           See recent office consult note and today's upper                            endoscopy report Medicines:                Monitored Anesthesia Care Procedure:                Pre-Anesthesia Assessment:                           - Prior to the procedure, a History and Physical                            was performed, and patient medications and                            allergies were reviewed. The patient's tolerance of                            previous anesthesia was also reviewed. The risks                            and benefits of the procedure and the sedation                            options and risks were discussed with the patient.                            All questions were answered, and informed consent                            was obtained. Prior Anticoagulants: The patient has                            taken no anticoagulant or antiplatelet agents. ASA                            Grade Assessment: II - A patient with mild systemic                            disease. After reviewing the risks and benefits,  the patient was deemed in satisfactory condition to                            undergo the procedure.                           After obtaining informed consent, the colonoscope                            was passed under direct vision. Throughout the                            procedure, the patient's blood pressure, pulse, and                            oxygen saturations were monitored continuously. The                             [id number] Colonoscope [id number] was introduced                            through the anus and advanced to the the cecum,                            identified by appendiceal orifice and ileocecal                            valve. The colonoscopy was somewhat difficult due                            to a redundant colon and a tortuous colon. The                            patient tolerated the procedure well. The quality                            of the bowel preparation was excellent. The                            ileocecal valve, appendiceal orifice, and rectum                            were photographed. (TI could not be intubated due                            to scope looping) Scope In: 3:04:15 PM Scope Out: 3:20:55 PM Scope Withdrawal Time: 0 hours 12 minutes 54 seconds  Total Procedure Duration: 0 hours 16 minutes 40 seconds  Findings:                 The perianal and digital rectal examinations were                            normal.  Repeat examination of right colon under NBI                            performed.                           A 10 mm polyp was found in the descending colon.                            The polyp was semi-sessile. The polyp was removed                            with a cold snare. Resection and retrieval were                            complete.                           Retroflexion in the rectum was not performed due to                            anatomy.                           The exam was otherwise without abnormality. Complications:            No immediate complications. Estimated Blood Loss:     Estimated blood loss was minimal. Impression:               - One 10 mm polyp in the descending colon, removed                            with a cold snare. Resected and retrieved.                           - The examination was otherwise normal.                           Benign intermittent anal bleeding, no  hemorrhoids                            or other anal/rectal pathology seen. Recommendation:           - Patient has a contact number available for                            emergencies. The signs and symptoms of potential                            delayed complications were discussed with the                            patient. Return to normal activities tomorrow.                            Written discharge instructions were provided to the  patient.                           - Resume previous diet.                           - Continue present medications.                           - Await pathology results.                           - Repeat colonoscopy is recommended for                            surveillance. The colonoscopy date will be                            determined after pathology results from today's                            exam become available for review.                           - See the other procedure note for documentation of                            additional recommendations.                           - Schedule small bowel video capsule study for                            further evaluation of iron deficiency anemia and                            heme positive stool. Jamie Cox L. Dominic Friendly, MD 09/28/2023 3:35:09 PM This report has been signed electronically.

## 2023-09-28 NOTE — Progress Notes (Signed)
 Pt's states no medical or surgical changes since previsit or office visit.

## 2023-09-28 NOTE — Progress Notes (Signed)
 Report given to PACU, vss

## 2023-09-28 NOTE — Progress Notes (Signed)
 1503 HR > 100 with esmolol 25 mg given IV, MD updated, vss

## 2023-09-29 ENCOUNTER — Telehealth: Payer: Self-pay

## 2023-09-29 DIAGNOSIS — R195 Other fecal abnormalities: Secondary | ICD-10-CM

## 2023-09-29 DIAGNOSIS — D509 Iron deficiency anemia, unspecified: Secondary | ICD-10-CM

## 2023-09-29 NOTE — Telephone Encounter (Signed)
  Follow up Call-     09/28/2023    1:27 PM  Call back number  Post procedure Call Back phone  # [phone]  Permission to leave phone message Yes     Patient questions:  Do you have a fever, pain , or abdominal swelling? No. Pain Score  0 *  Have you tolerated food without any problems? Yes.    Have you been able to return to your normal activities? Yes.    Do you have any questions about your discharge instructions: Diet   No. Medications  No. Follow up visit  No.  Do you have questions or concerns about your Care? No.  Actions: * If pain score is 4 or above: No action needed, pain <4.

## 2023-09-29 NOTE — Telephone Encounter (Signed)
 I spoke with Jamie Cox and set her up for the capsule study for 10/16/2023, arrive at 8:30AM on the 3rd floor. I confirmed her address and she wants the instructions mailed to her. She will call with any questions once she gets them. Capsule form put on Dr Revonda Castles desk to be completed.

## 2023-10-03 ENCOUNTER — Encounter: Payer: Self-pay | Admitting: Gastroenterology

## 2023-10-03 LAB — SURGICAL PATHOLOGY

## 2023-10-04 ENCOUNTER — Other Ambulatory Visit: Payer: Self-pay | Admitting: Family Medicine

## 2023-10-04 NOTE — Telephone Encounter (Signed)
 Dr Dominic Friendly completed the form and it was placed in Amy's office in the capsule folder.

## 2023-10-08 NOTE — Assessment & Plan Note (Signed)
Her work has become physically easier so her breathing has been less of a barrier. No recent exacerbation

## 2023-10-08 NOTE — Assessment & Plan Note (Signed)
 Encouraged to get adequate exercise, calcium and vitamin d intake

## 2023-10-08 NOTE — Assessment & Plan Note (Signed)
 Increase leafy greens, consider increased lean red meat and using cast iron cookware. Continue to monitor, report any concerns

## 2023-10-08 NOTE — Assessment & Plan Note (Signed)
 Supplement and monitor

## 2023-10-08 NOTE — Assessment & Plan Note (Signed)
 Patient with long history of chronic cough.

## 2023-10-09 ENCOUNTER — Encounter: Payer: Self-pay | Admitting: Family Medicine

## 2023-10-09 ENCOUNTER — Ambulatory Visit: Payer: 59 | Admitting: Family Medicine

## 2023-10-09 VITALS — BP 126/88 | HR 62 | Temp 97.8°F | Resp 16 | Ht 61.0 in | Wt 109.4 lb

## 2023-10-09 DIAGNOSIS — R053 Chronic cough: Secondary | ICD-10-CM

## 2023-10-09 DIAGNOSIS — E119 Type 2 diabetes mellitus without complications: Secondary | ICD-10-CM

## 2023-10-09 DIAGNOSIS — D509 Iron deficiency anemia, unspecified: Secondary | ICD-10-CM | POA: Diagnosis not present

## 2023-10-09 DIAGNOSIS — E559 Vitamin D deficiency, unspecified: Secondary | ICD-10-CM | POA: Diagnosis not present

## 2023-10-09 DIAGNOSIS — J441 Chronic obstructive pulmonary disease with (acute) exacerbation: Secondary | ICD-10-CM | POA: Diagnosis not present

## 2023-10-09 DIAGNOSIS — E538 Deficiency of other specified B group vitamins: Secondary | ICD-10-CM | POA: Diagnosis not present

## 2023-10-09 DIAGNOSIS — M810 Age-related osteoporosis without current pathological fracture: Secondary | ICD-10-CM | POA: Diagnosis not present

## 2023-10-09 LAB — COMPREHENSIVE METABOLIC PANEL WITH GFR
ALT: 12 U/L (ref 0–35)
AST: 14 U/L (ref 0–37)
Albumin: 4 g/dL (ref 3.5–5.2)
Alkaline Phosphatase: 73 U/L (ref 39–117)
BUN: 13 mg/dL (ref 6–23)
CO2: 28 meq/L (ref 19–32)
Calcium: 9.5 mg/dL (ref 8.4–10.5)
Chloride: 106 meq/L (ref 96–112)
Creatinine, Ser: 0.86 mg/dL (ref 0.40–1.20)
GFR: 71.07 mL/min (ref >60.00)
Glucose, Bld: 79 mg/dL (ref 70–99)
Potassium: 4.2 meq/L (ref 3.5–5.1)
Sodium: 142 meq/L (ref 135–145)
Total Bilirubin: 0.2 mg/dL (ref 0.2–1.2)
Total Protein: 6.8 g/dL (ref 6.0–8.3)

## 2023-10-09 LAB — MICROALBUMIN / CREATININE URINE RATIO
Creatinine,U: 240.9 mg/dL
Microalb Creat Ratio: 15.7 mg/g (ref 0.0–30.0)
Microalb, Ur: 3.8 mg/dL — ABNORMAL HIGH (ref 0.0–1.9)

## 2023-10-09 LAB — LIPID PANEL
Cholesterol: 176 mg/dL (ref 0–200)
HDL: 76.7 mg/dL (ref >39.00)
LDL Cholesterol: 79 mg/dL (ref 0–99)
NonHDL: 99.62
Total CHOL/HDL Ratio: 2
Triglycerides: 101 mg/dL (ref 0.0–149.0)
VLDL: 20.2 mg/dL (ref 0.0–40.0)

## 2023-10-09 LAB — VITAMIN D 25 HYDROXY (VIT D DEFICIENCY, FRACTURES): VITD: 34.73 ng/mL (ref 30.00–100.00)

## 2023-10-09 LAB — HEMOGLOBIN A1C: Hgb A1c MFr Bld: 6.7 % — ABNORMAL HIGH (ref 4.6–6.5)

## 2023-10-09 LAB — TSH: TSH: 0.79 u[IU]/mL (ref 0.35–5.50)

## 2023-10-09 MED ORDER — LANCETS MISC. MISC: 1.0000 | Freq: Three times a day (TID) | 3 refills | Status: AC

## 2023-10-09 MED ORDER — CYANOCOBALAMIN 1000 MCG/ML IJ SOLN
1000.0000 ug | Freq: Once | INTRAMUSCULAR | Status: AC
  Administered 2023-10-09: 1000 ug via INTRAMUSCULAR

## 2023-10-09 MED ORDER — BLOOD GLUCOSE MONITORING SUPPL DEVI: 1.0000 | Freq: Three times a day (TID) | 0 refills | Status: AC

## 2023-10-09 MED ORDER — BLOOD GLUCOSE TEST VI STRP: 1.0000 | ORAL_STRIP | Freq: Three times a day (TID) | 3 refills | Status: AC

## 2023-10-09 MED ORDER — LANCET DEVICE MISC: 1.0000 | Freq: Three times a day (TID) | 0 refills | Status: AC

## 2023-10-09 NOTE — Patient Instructions (Signed)

## 2023-10-09 NOTE — Progress Notes (Signed)
 Subjective:    Patient ID: Jamie Cox, female    DOB: 15-Jun-1960, 63 y.o.   MRN: [id number]  Chief Complaint  Patient presents with   Medical Management of Chronic Issues    Patient presents today for a 3 month follow-up.   Quality Metric Gaps    Pap,TDAP,zoster,foot & eye exam    HPI Discussed the use of AI scribe software for clinical note transcription with the patient, who gave verbal consent to proceed.  History of Present Illness Jamie Cox is a 63 year old female who presents for follow-up and recent gastrointestinal evaluations.  She has persistent respiratory symptoms, including a cough, which are currently manageable. She continues to follow up with pulmonology for these symptoms. While the symptoms are persistent, they are not as severe as in the past, where they would keep her up all night. No recent exacerbations that disrupt her sleep.  She recently underwent both a colonoscopy and an endoscopy last week. During these procedures, a decent-sized polyp was removed, and a hiatal hernia was identified along with some redness in the stomach. Biopsies were taken, and she is scheduled for a follow-up appointment on the 12th to discuss further evaluations. She has not yet received the pathology results for the polyp but was informed of chronic inactive gastritis without H. pylori infection from the stomach biopsies.  She has been experiencing anemia and is under the care of Dr. Selinda Dales for this condition. There is a concern that the gastritis may be affecting her anemia and appetite. She is scheduled to see him on Wednesday.  Her recent blood work indicated that her A1c level is 6.7, which is in the diabetic range. She wants to adjust her diet, particularly reducing starchy carbohydrates such as white bread, rice, pasta, sweets, and potatoes. She does not currently use a glucometer to monitor her blood sugar levels.      Past Medical History:  Diagnosis Date    Allergy    Asthma    Atypical chest pain    Chronic sinusitis    Followed by Dr. 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   History of chicken pox    Hypercalcemia 07/19/2015   Hypertension    Osteoporosis, unspecified    steroid induced   Personal history of other diseases of circulatory system    Rheumatoid arthritis(714.0)    Unspecified deficiency anemia    microcytic   Urticaria     Past Surgical History:  Procedure Laterality Date   NASAL SINUS SURGERY  06/2012   NASAL SINUS SURGERY     2010   OPEN REDUCTION INTERNAL FIXATION (ORIF) METACARPAL Right 07/13/2022   Procedure: OPEN REDUCTION INTERNAL FIXATION METACARPAL RIGHT SMALL FINGER;  Surgeon: Arvil Birks, MD;  Location: [facility] OR;  Service: Orthopedics;  Laterality: Right;  regional with iv sedation    Family History  Problem Relation Age of Onset   Scleroderma Mother    Heart disease Mother    Kidney disease Mother    Other Mother    Prostate cancer Father    Diabetes Father    Heart disease Father        CHF   Arthritis Father        s/p hip replacement   Cancer Father        lung cancer with mets, smoker   Other Sister        Cardiomegaly  Arthritis Sister    Heart disease Sister        rare   Sleep apnea Brother    Heart disease Brother    Lung cancer Paternal Uncle    Liver cancer Maternal Grandmother    Hypertension Maternal Grandmother    Heart attack Maternal Grandmother    Coronary artery disease Paternal Grandmother    Hypertension Paternal Grandmother    Liver cancer Paternal Grandmother    Alzheimer's disease Paternal Grandfather    Arthritis Daughter    Hyperlipidemia Son    Lupus Cousin    Arthritis Cousin    Colon cancer Neg Hx    Asthma Neg Hx    Allergic rhinitis Neg Hx    Eczema Neg Hx    Immunodeficiency Neg Hx    Angioedema Neg Hx    Esophageal cancer Neg Hx    Rectal cancer Neg Hx     Stomach cancer Neg Hx    Colon polyps Neg Hx     Social History   Socioeconomic History   Marital status: Married    Spouse name: Not on file   Number of children: 2   Years of education: Not on file   Highest education level: Not on file  Occupational History   Occupation: Data processing manager  Tobacco Use   Smoking status: Never   Smokeless tobacco: Never  Vaping Use   Vaping status: Never Used  Substance and Sexual Activity   Alcohol use: No    Alcohol/week: 0.0 standard drinks of alcohol   Drug use: No   Sexual activity: Not Currently  Other Topics Concern   Not on file  Social History Narrative   Not on file   Social Drivers of Health   Financial Resource Strain: Not on file  Food Insecurity: Not on file  Transportation Needs: No Transportation Needs (03/10/2023)   PRAPARE - Administrator, Civil Service (Medical): No    Lack of Transportation (Non-Medical): No  Physical Activity: Not on file  Stress: Not on file  Social Connections: Not on file  Intimate Partner Violence: Not on file    Outpatient Medications Prior to Visit  Medication Sig Dispense Refill   albuterol  (VENTOLIN  HFA) 108 (90 Base) MCG/ACT inhaler INHALE ONE TO TWO PUFFS INTO THE LUNGS EVERY 6 HOURS AS NEEDED FOR WHEEZING OR SHORTNESS OF BREATH 18 each 5   atorvastatin  (LIPITOR) 10 MG tablet Take 1 tablet by mouth once daily 90 tablet 0   budesonide -formoterol  (SYMBICORT ) 160-4.5 MCG/ACT inhaler Inhale 2 puffs into the lungs 2 (two) times daily. 10.2 g 12   calcium -vitamin D  (OSCAL WITH D) 500-5 MG-MCG tablet Take 1 tablet by mouth daily with breakfast.     famotidine  (PEPCID ) 40 MG tablet TAKE 1 TABLET BY MOUTH EVERY DAY 90 tablet 1   Ferrous Fumarate -Folic Acid  (HEMATINIC/FOLIC ACID ) 324-1 MG TABS Take 1 tablet by mouth daily. 90 tablet 1   fluticasone  (FLONASE ) 50 MCG/ACT nasal spray Place 2 sprays into both nostrils daily. 16 g 5   gabapentin  (NEURONTIN ) 300 MG capsule Take 1 capsule  (300 mg total) by mouth 2 (two) times daily as needed. 180 capsule 1   HYDROcodone -acetaminophen  (NORCO/VICODIN) 5-325 MG tablet Take 1 tablet by mouth every 6 (six) hours as needed for moderate pain (pain score 4-6) or severe pain (pain score 7-10). 40 tablet 0   levocetirizine (XYZAL ) 5 MG tablet Take 1 tablet (5 mg total) by mouth every evening. 30 tablet 4  meloxicam  (MOBIC ) 7.5 MG tablet TAKE 1 TO 2 TABLETS BY MOUTH ONCE DAILY AS NEEDED FOR PAIN 60 tablet 03   metoprolol  succinate (TOPROL -XL) 25 MG 24 hr tablet Take 1 tablet (25 mg total) by mouth daily. 90 tablet 1   montelukast  (SINGULAIR ) 10 MG tablet TAKE 1 TABLET BY MOUTH AT BEDTIME 90 tablet 0   pantoprazole  (PROTONIX ) 20 MG tablet Take 1 tablet (20 mg total) by mouth daily before breakfast. 90 tablet 3   predniSONE  (DELTASONE ) 2.5 MG tablet Take 1 tablet by mouth once daily with breakfast 90 tablet 0   Pseudoephedrine-Guaifenesin  (MUCINEX  D MAX STRENGTH) [phone] MG TB12 Take 1 tablet by mouth 2 (two) times daily.     benzonatate  (TESSALON ) 100 MG capsule Take 1 capsule (100 mg total) by mouth 3 (three) times daily as needed. (Patient not taking: Reported on 08/24/2023) 20 capsule 0   Facility-Administered Medications Prior to Visit  Medication Dose Route Frequency Provider Last Rate Last Admin   denosumab  (PROLIA ) injection 60 mg  60 mg Subcutaneous Once Kristopher Attwood A, MD        Allergies  Allergen Reactions   Augmentin  [Amoxicillin -Pot Clavulanate] Hives, Itching and Swelling   Avelox [Moxifloxacin Hcl In Nacl] Diarrhea, Nausea And Vomiting and Other (See Comments)    Dizziness, Cold chills    Bactrim  [Sulfamethoxazole -Trimethoprim ] Hives   Doxycycline      Abdominal discomfort, anorexia   Sulfamethoxazole -Trimethoprim  Hives   Amoxicillin  Other (See Comments)    Review of Systems  Constitutional:  Positive for malaise/fatigue. Negative for fever.  HENT:  Positive for congestion.   Eyes:  Negative for blurred vision.   Respiratory:  Positive for cough. Negative for shortness of breath.   Cardiovascular:  Negative for chest pain, palpitations and leg swelling.  Gastrointestinal:  Negative for abdominal pain, blood in stool and nausea.  Genitourinary:  Negative for dysuria and frequency.  Musculoskeletal:  Positive for back pain and joint pain. Negative for falls.  Skin:  Negative for rash.  Neurological:  Negative for dizziness, loss of consciousness and headaches.  Endo/Heme/Allergies:  Negative for environmental allergies.  Psychiatric/Behavioral:  Negative for depression. The patient is not nervous/anxious.        Objective:    Physical Exam Constitutional:      General: She is not in acute distress.    Appearance: Normal appearance. She is well-developed. She is not toxic-appearing.  HENT:     Head: Normocephalic and atraumatic.     Right Ear: External ear normal.     Left Ear: External ear normal.     Nose: Nose normal.  Eyes:     General:        Right eye: No discharge.        Left eye: No discharge.     Conjunctiva/sclera: Conjunctivae normal.  Neck:     Thyroid : No thyromegaly.  Cardiovascular:     Rate and Rhythm: Normal rate and regular rhythm.     Heart sounds: Normal heart sounds. No murmur heard. Pulmonary:     Effort: Pulmonary effort is normal. No respiratory distress.     Breath sounds: Normal breath sounds.  Abdominal:     General: Bowel sounds are normal.     Palpations: Abdomen is soft.     Tenderness: There is no abdominal tenderness. There is no guarding.  Musculoskeletal:        General: Normal range of motion.     Cervical back: Neck supple.  Lymphadenopathy:  Cervical: No cervical adenopathy.  Skin:    General: Skin is warm and dry.  Neurological:     Mental Status: She is alert and oriented to person, place, and time.  Psychiatric:        Mood and Affect: Mood normal.        Behavior: Behavior normal.        Thought Content: Thought content normal.         Judgment: Judgment normal.     BP 126/88   Pulse 62   Temp 97.8 F (36.6 C)   Resp 16   Ht 5\' 1"  (1.549 m)   Wt 109 lb 6.4 oz (49.6 kg)   SpO2 97%   BMI 20.67 kg/m  Wt Readings from Last 3 Encounters:  10/09/23 109 lb 6.4 oz (49.6 kg)  09/28/23 108 lb (49 kg)  08/24/23 108 lb 6.4 oz (49.2 kg)    Diabetic Foot Exam - Simple   No data filed    Lab Results  Component Value Date   WBC 4.3 08/24/2023   HGB 11.4 (L) 08/24/2023   HCT 36.2 08/24/2023   PLT 165.0 08/24/2023   GLUCOSE 79 10/09/2023   CHOL 176 10/09/2023   TRIG 101.0 10/09/2023   HDL 76.70 10/09/2023   LDLCALC 79 10/09/2023   ALT 12 10/09/2023   AST 14 10/09/2023   NA 142 10/09/2023   K 4.2 10/09/2023   CL 106 10/09/2023   CREATININE 0.86 10/09/2023   BUN 13 10/09/2023   CO2 28 10/09/2023   TSH 0.79 10/09/2023   INR 1.0 11/25/2022   HGBA1C 6.7 (H) 10/09/2023   MICROALBUR 3.8 (H) 10/09/2023    Lab Results  Component Value Date   TSH 0.79 10/09/2023   Lab Results  Component Value Date   WBC 4.3 08/24/2023   HGB 11.4 (L) 08/24/2023   HCT 36.2 08/24/2023   MCV 74.8 (L) 08/24/2023   PLT 165.0 08/24/2023   Lab Results  Component Value Date   NA 142 10/09/2023   K 4.2 10/09/2023   CO2 28 10/09/2023   GLUCOSE 79 10/09/2023   BUN 13 10/09/2023   CREATININE 0.86 10/09/2023   BILITOT 0.2 10/09/2023   ALKPHOS 73 10/09/2023   AST 14 10/09/2023   ALT 12 10/09/2023   PROT 6.8 10/09/2023   ALBUMIN 4.0 10/09/2023   CALCIUM  9.5 10/09/2023   ANIONGAP 7 02/09/2023   GFR 71.07 10/09/2023   Lab Results  Component Value Date   CHOL 176 10/09/2023   Lab Results  Component Value Date   HDL 76.70 10/09/2023   Lab Results  Component Value Date   LDLCALC 79 10/09/2023   Lab Results  Component Value Date   TRIG 101.0 10/09/2023   Lab Results  Component Value Date   CHOLHDL 2 10/09/2023   Lab Results  Component Value Date   HGBA1C 6.7 (H) 10/09/2023       Assessment & Plan:  Iron  deficiency anemia, unspecified iron deficiency anemia type Assessment & Plan: Increase leafy greens, consider increased lean red meat and using cast iron cookware. Continue to monitor, report any concerns   Asthma, chronic obstructive, with acute exacerbation (HCC) Assessment & Plan: Her work has become physically easier so her breathing has been less of a barrier. No recent exacerbation   Chronic cough Assessment & Plan: Patient with long history of chronic cough.   Osteoporosis, unspecified osteoporosis type, unspecified pathological fracture presence Assessment & Plan: Encouraged to get adequate exercise, calcium   and vitamin d  intake    Vitamin D  deficiency Assessment & Plan: Supplement and monitor   Orders: -     Cyanocobalamin  -     VITAMIN D  25 Hydroxy (Vit-D Deficiency, Fractures)  Controlled type 2 diabetes mellitus without complication, without long-term current use of insulin  (HCC) -     TSH -     Lipid panel -     Hemoglobin A1c -     Comprehensive metabolic panel with GFR -     Microalbumin / creatinine urine ratio -     Blood Glucose Monitoring Suppl; 1 each by Does not apply route 3 (three) times daily. May substitute to any manufacturer covered by patient's insurance.  Dispense: 1 each; Refill: 0 -     Blood Glucose Test; 1 each by Does not apply route 3 (three) times daily. May substitute to any manufacturer covered by patient's insurance.  Dispense: 100 strip; Refill: 3 -     Lancet Device; 1 each by Does not apply route in the morning, at noon, and at bedtime. May substitute to any manufacturer covered by patient's insurance.  Dispense: 1 each; Refill: 0 -     Lancets Misc.; 1 each by Does not apply route in the morning, at noon, and at bedtime. May substitute to any manufacturer covered by patient's insurance.  Dispense: 100 each; Refill: 3    Assessment and Plan Assessment & Plan Type 2 diabetes mellitus without complications New diagnosis with A1c of  6.7%. Emphasized dietary modifications and glucometer use for glucose monitoring. - Order glucometer, test strips, and lancets. - Educate on dietary modifications: reduce starchy carbohydrates, pair carbohydrates with proteins, avoid skipping meals. - Offer referral to a nutritionist.  Iron deficiency anemia Ongoing management. Recent colonoscopy and endoscopy revealed a polyp and chronic inactive gastritis. Awaiting pathology results. - Continue follow-up with gastroenterology.  Chronic inactive gastritis Endoscopy showed chronic inactive gastritis without H. pylori infection. Awaiting further evaluation. - Continue follow-up with gastroenterology.  GERD with hiatal hernia Endoscopy revealed hiatal hernia and gastric erythema, consistent with GERD. No H. pylori infection.  Chronic cough Persistent but manageable. Continues follow-up with pulmonology.     Randie Bustle, MD

## 2023-10-10 ENCOUNTER — Other Ambulatory Visit: Payer: Self-pay | Admitting: *Deleted

## 2023-10-10 DIAGNOSIS — M810 Age-related osteoporosis without current pathological fracture: Secondary | ICD-10-CM

## 2023-10-10 MED ORDER — DENOSUMAB 60 MG/ML ~~LOC~~ SOSY
60.0000 mg | PREFILLED_SYRINGE | SUBCUTANEOUS | Status: AC
  Administered 2023-11-02: 60 mg via SUBCUTANEOUS

## 2023-10-11 ENCOUNTER — Telehealth: Payer: Self-pay

## 2023-10-11 ENCOUNTER — Inpatient Hospital Stay: Payer: 59 | Admitting: Internal Medicine

## 2023-10-11 ENCOUNTER — Other Ambulatory Visit (HOSPITAL_COMMUNITY): Payer: Self-pay

## 2023-10-11 ENCOUNTER — Inpatient Hospital Stay: Payer: 59 | Attending: Internal Medicine

## 2023-10-11 ENCOUNTER — Inpatient Hospital Stay

## 2023-10-11 ENCOUNTER — Encounter: Payer: Self-pay | Admitting: Family Medicine

## 2023-10-11 VITALS — BP 167/92 | HR 79 | Temp 98.1°F | Resp 16 | Ht 61.0 in | Wt 108.8 lb

## 2023-10-11 DIAGNOSIS — Z79899 Other long term (current) drug therapy: Secondary | ICD-10-CM | POA: Diagnosis not present

## 2023-10-11 DIAGNOSIS — D508 Other iron deficiency anemias: Secondary | ICD-10-CM

## 2023-10-11 DIAGNOSIS — K909 Intestinal malabsorption, unspecified: Secondary | ICD-10-CM | POA: Insufficient documentation

## 2023-10-11 DIAGNOSIS — R5383 Other fatigue: Secondary | ICD-10-CM | POA: Insufficient documentation

## 2023-10-11 DIAGNOSIS — R718 Other abnormality of red blood cells: Secondary | ICD-10-CM | POA: Insufficient documentation

## 2023-10-11 DIAGNOSIS — K449 Diaphragmatic hernia without obstruction or gangrene: Secondary | ICD-10-CM | POA: Diagnosis not present

## 2023-10-11 DIAGNOSIS — M069 Rheumatoid arthritis, unspecified: Secondary | ICD-10-CM | POA: Diagnosis not present

## 2023-10-11 DIAGNOSIS — M810 Age-related osteoporosis without current pathological fracture: Secondary | ICD-10-CM | POA: Insufficient documentation

## 2023-10-11 DIAGNOSIS — D539 Nutritional anemia, unspecified: Secondary | ICD-10-CM | POA: Diagnosis not present

## 2023-10-11 DIAGNOSIS — D509 Iron deficiency anemia, unspecified: Secondary | ICD-10-CM | POA: Insufficient documentation

## 2023-10-11 DIAGNOSIS — J45909 Unspecified asthma, uncomplicated: Secondary | ICD-10-CM | POA: Insufficient documentation

## 2023-10-11 DIAGNOSIS — K2950 Unspecified chronic gastritis without bleeding: Secondary | ICD-10-CM | POA: Diagnosis not present

## 2023-10-11 DIAGNOSIS — Z7951 Long term (current) use of inhaled steroids: Secondary | ICD-10-CM | POA: Diagnosis not present

## 2023-10-11 DIAGNOSIS — Z860100 Personal history of colon polyps, unspecified: Secondary | ICD-10-CM | POA: Insufficient documentation

## 2023-10-11 DIAGNOSIS — Z7952 Long term (current) use of systemic steroids: Secondary | ICD-10-CM | POA: Diagnosis not present

## 2023-10-11 DIAGNOSIS — K219 Gastro-esophageal reflux disease without esophagitis: Secondary | ICD-10-CM | POA: Insufficient documentation

## 2023-10-11 DIAGNOSIS — I10 Essential (primary) hypertension: Secondary | ICD-10-CM | POA: Diagnosis not present

## 2023-10-11 LAB — IRON AND IRON BINDING CAPACITY (CC-WL,HP ONLY)
Iron: 99 ug/dL (ref 28–170)
Saturation Ratios: 37 % — ABNORMAL HIGH (ref 10.4–31.8)
TIBC: 265 ug/dL (ref 250–450)
UIBC: 166 ug/dL (ref 148–442)

## 2023-10-11 LAB — CMP (CANCER CENTER ONLY)
ALT: 14 U/L (ref 0–44)
AST: 18 U/L (ref 15–41)
Albumin: 4.3 g/dL (ref 3.5–5.0)
Alkaline Phosphatase: 63 U/L (ref 38–126)
Anion gap: 10 (ref 5–15)
BUN: 13 mg/dL (ref 8–23)
CO2: 26 mmol/L (ref 22–32)
Calcium: 9.7 mg/dL (ref 8.9–10.3)
Chloride: 102 mmol/L (ref 98–111)
Creatinine: 0.81 mg/dL (ref 0.44–1.00)
GFR, Estimated: >60 mL/min (ref >60)
Glucose, Bld: 77 mg/dL (ref 70–99)
Potassium: 3.6 mmol/L (ref 3.5–5.1)
Sodium: 138 mmol/L (ref 135–145)
Total Bilirubin: 0.3 mg/dL (ref 0.0–1.2)
Total Protein: 7.7 g/dL (ref 6.5–8.1)

## 2023-10-11 LAB — CBC WITH DIFFERENTIAL (CANCER CENTER ONLY)
Abs Immature Granulocytes: 0.02 10*3/uL (ref 0.00–0.07)
Basophils Absolute: 0 10*3/uL (ref 0.0–0.1)
Basophils Relative: 0 %
Eosinophils Absolute: 0.1 10*3/uL (ref 0.0–0.5)
Eosinophils Relative: 2 %
HCT: 33 % — ABNORMAL LOW (ref 36.0–46.0)
Hemoglobin: 10.6 g/dL — ABNORMAL LOW (ref 12.0–15.0)
Immature Granulocytes: 0 %
Lymphocytes Relative: 25 %
Lymphs Abs: 1.2 10*3/uL (ref 0.7–4.0)
MCH: 22.9 pg — ABNORMAL LOW (ref 26.0–34.0)
MCHC: 32.1 g/dL (ref 30.0–36.0)
MCV: 71.3 fL — ABNORMAL LOW (ref 80.0–100.0)
Monocytes Absolute: 0.3 10*3/uL (ref 0.1–1.0)
Monocytes Relative: 7 %
Neutro Abs: 3.2 10*3/uL (ref 1.7–7.7)
Neutrophils Relative %: 66 %
Platelet Count: 126 10*3/uL — ABNORMAL LOW (ref 150–400)
RBC: 4.63 MIL/uL (ref 3.87–5.11)
RDW: 14.9 % (ref 11.5–15.5)
WBC Count: 4.9 10*3/uL (ref 4.0–10.5)
nRBC: 0 % (ref 0.0–0.2)

## 2023-10-11 LAB — VITAMIN B12: Vitamin B-12: 1054 pg/mL — ABNORMAL HIGH (ref 180–914)

## 2023-10-11 LAB — FOLATE: Folate: 23.1 ng/mL (ref >5.9)

## 2023-10-11 LAB — FERRITIN: Ferritin: 1331 ng/mL — ABNORMAL HIGH (ref 11–307)

## 2023-10-11 NOTE — Telephone Encounter (Signed)
 Pt ready for scheduling for PROLIA  on or after : 10/11/23  Option# 1: Buy/Bill (Office supplied medication)  Out-of-pocket cost due at time of clinic visit: $20  Number of injection/visits approved: 2  Primary: [employer] Prolia  co-insurance: $10 Admin fee co-insurance: $10  Secondary: --- Prolia  co-insurance:  Admin fee co-insurance:   Medical Benefit Details: Date Benefits were checked: 10/11/23 Deductible: $1250 Met of $1250 Required/ Coinsurance: $10/ Admin Fee: $10  Prior Auth: APPROVED PA# [id number] Expiration Date: 06/20/23-06/18/24  # of doses approved:2 ----------------------------------------------------------------------- Option# 2- Med Obtained from pharmacy:  Pharmacy benefit: Copay $--- (Paid to pharmacy) Admin Fee: --- (Pay at clinic)  Prior Auth: --- PA# Expiration Date:   # of doses approved:   If patient wants fill through the pharmacy benefit please send prescription to:  --- , and include estimated need by date in rx notes. Pharmacy will ship medication directly to the office.  Patient IS eligible for Prolia  Copay Card. Copay Card can make patient's cost as little as $25. Link to apply: [email or web address]://[email or web address]  ** This summary of benefits is an estimation of the patient's out-of-pocket cost. Exact cost may very based on individual plan coverage.

## 2023-10-11 NOTE — Progress Notes (Signed)
 [facility] [phone]   [phone]  OFFICE PROGRESS NOTE  Neda Balk, MD [address] [place] [address]  DIAGNOSIS: microcytic anemia likely from iron deficiency and probably from a gastrointestinal blood loss.   PRIOR THERAPY: None  CURRENT THERAPY: Oral iron tablet with ferrous fumarate  in addition to folic acid  and vitamin B12 injection  INTERVAL HISTORY: Jamie Cox 63 y.o. female returns to the clinic today for follow-up visit. Discussed the use of AI scribe software for clinical note transcription with the patient, who gave verbal consent to proceed.  History of Present Illness   Jamie Cox is a 63 year old female with microcytic anemia who presents for evaluation and repeat blood work.  She has a history of microcytic anemia, likely due to iron deficiency secondary to gastrointestinal blood loss. She is currently on oral iron supplements with ferrous fumarate , along with folic acid  and vitamin B12 injections. She experiences periods of fatigue, particularly as the time for her next B12 injection approaches, but feels better after receiving her B12 shot with reduced fatigue. No dizzy spells.  She occasionally notices blood in her stool, which prompted a colonoscopy last week. The colonoscopy revealed a 10 mm polyp in the descending colon, which was removed. The rest of the colonoscopy was unremarkable. An upper endoscopy showed a small sliding hiatal hernia and erythematous mucosa on the anterior wall of the stomach, with biopsies taken. Pathology results indicated chronic inactive gastritis and a hyperplastic colon polyp, with no other significant findings.  Her hemoglobin level today is 10.6, slightly improved from 10.3 in February, but fluctuating over time. Her MCV remains low at 71.3. Despite these findings, her serum iron and iron saturation levels are within normal limits, with a serum iron of 99 and  iron saturation of 37.  She has not been tested for sickle cell or thalassemia and is unaware of any family history of these conditions.        MEDICAL HISTORY: Past Medical History:  Diagnosis Date   Allergy    Asthma    Atypical chest pain    Chronic sinusitis    Followed by Dr. 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  MEDICATIONS:  Current Outpatient Medications  Medication Sig Dispense Refill   albuterol  (VENTOLIN  HFA) 108 (90 Base) MCG/ACT inhaler INHALE ONE TO TWO PUFFS INTO THE LUNGS EVERY 6 HOURS AS NEEDED FOR WHEEZING OR SHORTNESS OF BREATH 18 each 5   atorvastatin  (LIPITOR) 10 MG tablet Take 1 tablet by mouth once daily 90 tablet 0   Blood Glucose Monitoring Suppl DEVI 1 each by Does not apply route 3 (three) times daily. May substitute to any manufacturer covered by patient's insurance. 1 each 0   budesonide -formoterol  (SYMBICORT ) 160-4.5 MCG/ACT inhaler Inhale 2 puffs into the lungs 2 (two) times daily. 10.2 g 12   calcium -vitamin D  (OSCAL WITH D) 500-5 MG-MCG tablet Take 1  tablet by mouth daily with breakfast.     famotidine  (PEPCID ) 40 MG tablet TAKE 1 TABLET BY MOUTH EVERY DAY 90 tablet 1   Ferrous Fumarate -Folic Acid  (HEMATINIC/FOLIC ACID ) 324-1 MG TABS Take 1 tablet by mouth daily. 90 tablet 1   fluticasone  (FLONASE ) 50 MCG/ACT nasal spray Place 2 sprays into both nostrils daily. 16 g 5   gabapentin   (NEURONTIN ) 300 MG capsule Take 1 capsule (300 mg total) by mouth 2 (two) times daily as needed. 180 capsule 1   Glucose Blood (BLOOD GLUCOSE TEST STRIPS) STRP 1 each by Does not apply route 3 (three) times daily. May substitute to any manufacturer covered by patient's insurance. 100 strip 3   HYDROcodone -acetaminophen  (NORCO/VICODIN) 5-325 MG tablet Take 1 tablet by mouth every 6 (six) hours as needed for moderate pain (pain score 4-6) or severe pain (pain score 7-10). 40 tablet 0   Lancet Device MISC 1 each by Does not apply route in the morning, at noon, and at bedtime. May substitute to any manufacturer covered by patient's insurance. 1 each 0   Lancets Misc. MISC 1 each by Does not apply route in the morning, at noon, and at bedtime. May substitute to any manufacturer covered by patient's insurance. 100 each 3   levocetirizine (XYZAL ) 5 MG tablet Take 1 tablet (5 mg total) by mouth every evening. 30 tablet 4   meloxicam  (MOBIC ) 7.5 MG tablet TAKE 1 TO 2 TABLETS BY MOUTH ONCE DAILY AS NEEDED FOR PAIN 60 tablet 03   metoprolol  succinate (TOPROL -XL) 25 MG 24 hr tablet Take 1 tablet (25 mg total) by mouth daily. 90 tablet 1   montelukast  (SINGULAIR ) 10 MG tablet TAKE 1 TABLET BY MOUTH AT BEDTIME 90 tablet 0   pantoprazole  (PROTONIX ) 20 MG tablet Take 1 tablet (20 mg total) by mouth daily before breakfast. 90 tablet 3   predniSONE  (DELTASONE ) 2.5 MG tablet Take 1 tablet by mouth once daily with breakfast 90 tablet 0   Pseudoephedrine-Guaifenesin  (MUCINEX  D MAX STRENGTH) [id number] MG TB12 Take 1 tablet by mouth 2 (two) times daily.     Current Facility-Administered Medications  Medication Dose Route Frequency Provider Last Rate Last Admin   [START ON 10/24/2023] denosumab  (PROLIA ) injection 60 mg  60 mg Subcutaneous Q6 months Neda Balk, MD        SURGICAL HISTORY:  Past Surgical History:  Procedure Laterality Date   NASAL SINUS SURGERY  06/2012   NASAL SINUS SURGERY     2010   OPEN  REDUCTION INTERNAL FIXATION (ORIF) METACARPAL Right 07/13/2022   Procedure: OPEN REDUCTION INTERNAL FIXATION METACARPAL RIGHT SMALL FINGER;  Surgeon: Arvil Birks, MD;  Location: [facility] OR;  Service: Orthopedics;  Laterality: Right;  regional with iv sedation    REVIEW OF SYSTEMS:  A comprehensive review of systems was negative except for: Constitutional: positive for fatigue   PHYSICAL EXAMINATION: General appearance: alert, cooperative, fatigued, and no distress Head: Normocephalic, without obvious abnormality, atraumatic Neck: no adenopathy, no JVD, supple, symmetrical, trachea midline, and thyroid  not enlarged, symmetric, no tenderness/mass/nodules Lymph nodes: Cervical, supraclavicular, and axillary nodes normal. Resp: clear to auscultation bilaterally Back: symmetric, no curvature. ROM normal. No CVA tenderness. Cardio: regular rate and rhythm, S1, S2 normal, no murmur, click, rub or gallop GI: soft, non-tender; bowel sounds normal; no masses,  no organomegaly Extremities: extremities normal, atraumatic, no cyanosis or edema  ECOG PERFORMANCE STATUS: 1 - Symptomatic but completely ambulatory  Blood pressure (!) 167/92, pulse 79, temperature 98.1 F (36.7  C), temperature source Temporal, resp. rate 16, height 5\' 1"  (1.549 m), weight 108 lb 12.8 oz (49.4 kg).  LABORATORY DATA: Lab Results  Component Value Date   WBC 4.9 10/11/2023   HGB 10.6 (L) 10/11/2023   HCT 33.0 (L) 10/11/2023   MCV 71.3 (L) 10/11/2023   PLT 126 (L) 10/11/2023      Chemistry      Component Value Date/Time   NA 138 10/11/2023 1004   K 3.6 10/11/2023 1004   CL 102 10/11/2023 1004   CO2 26 10/11/2023 1004   BUN 13 10/11/2023 1004   CREATININE 0.81 10/11/2023 1004   CREATININE 0.71 03/31/2020 1108      Component Value Date/Time   CALCIUM  9.7 10/11/2023 1004   ALKPHOS 63 10/11/2023 1004   AST 18 10/11/2023 1004   ALT 14 10/11/2023 1004   BILITOT 0.3 10/11/2023 1004       RADIOGRAPHIC STUDIES: No  results found.  ASSESSMENT AND PLAN: This is a very pleasant 63 years old African-American female with Iron Deficiency Anemia Previously diagnosed with iron deficiency anemia and started on iron supplementation, folic acid , and vitamin B12 injections. 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 Noted improvement in fatigue since starting treatment.  Microcytic anemia Microcytic anemia likely secondary to iron deficiency from gastrointestinal blood loss. Hemoglobin level is 10.6, and MCV is 71.3, indicating microcytosis. Iron levels are adequate with serum iron at 99 and iron saturation at 37. Differential diagnosis includes thalassemia, as hemoglobin levels remain low despite adequate iron supplementation. - Continue oral iron supplements with ferrous fumarate  - Continue vitamin B12 injections - Order hemoglobin electrophoresis to check for sickle cell or thalassemia - Repeat blood work in three months  Chronic inactive gastritis Chronic inactive gastritis identified on pathology from recent endoscopy.  Sliding hiatal hernia Small sliding hiatal hernia observed during upper endoscopy.  Hyperplastic colon polyp 10 mm hyperplastic colon polyp found in the descending colon during colonoscopy on September 28, 2023, and was removed.   The patient was advised to call immediately if she has any concerning symptoms in the interval.  The patient voices understanding of current disease status and treatment options and is in agreement with the current care plan.  All questions were answered. The patient knows to call the clinic with any problems, questions or concerns. We can certainly see the patient much sooner if necessary.  The total time spent in the appointment was 20 minutes.  Disclaimer: This note was dictated with voice recognition software. Similar sounding words can inadvertently be transcribed and may not be corrected upon review.       Follow-up visit.

## 2023-10-11 NOTE — Telephone Encounter (Signed)
 Prolia VOB initiated via [email or web address]  Next Prolia inj DUE: NOW

## 2023-10-11 NOTE — Telephone Encounter (Signed)
 Jamie Cox

## 2023-10-12 ENCOUNTER — Ambulatory Visit: Payer: Self-pay | Admitting: Podiatry

## 2023-10-12 ENCOUNTER — Encounter: Payer: Self-pay | Admitting: Podiatry

## 2023-10-12 DIAGNOSIS — M2011 Hallux valgus (acquired), right foot: Secondary | ICD-10-CM | POA: Diagnosis not present

## 2023-10-12 DIAGNOSIS — M2012 Hallux valgus (acquired), left foot: Secondary | ICD-10-CM

## 2023-10-12 DIAGNOSIS — Q828 Other specified congenital malformations of skin: Secondary | ICD-10-CM | POA: Diagnosis not present

## 2023-10-12 DIAGNOSIS — E119 Type 2 diabetes mellitus without complications: Secondary | ICD-10-CM

## 2023-10-12 DIAGNOSIS — M2042 Other hammer toe(s) (acquired), left foot: Secondary | ICD-10-CM

## 2023-10-12 DIAGNOSIS — B351 Tinea unguium: Secondary | ICD-10-CM | POA: Diagnosis not present

## 2023-10-12 DIAGNOSIS — M79676 Pain in unspecified toe(s): Secondary | ICD-10-CM

## 2023-10-12 DIAGNOSIS — M2041 Other hammer toe(s) (acquired), right foot: Secondary | ICD-10-CM | POA: Diagnosis not present

## 2023-10-14 LAB — HM DIABETES EYE EXAM

## 2023-10-15 LAB — HGB FRACTIONATION CASCADE
Hgb A2: 2.7 % (ref 1.8–3.2)
Hgb A: 97.3 % (ref 96.4–98.8)
Hgb F: 0 % (ref 0.0–2.0)
Hgb S: 0 %

## 2023-10-16 ENCOUNTER — Ambulatory Visit (INDEPENDENT_AMBULATORY_CARE_PROVIDER_SITE_OTHER): Admitting: Gastroenterology

## 2023-10-16 ENCOUNTER — Other Ambulatory Visit: Payer: Self-pay | Admitting: Family Medicine

## 2023-10-16 DIAGNOSIS — D500 Iron deficiency anemia secondary to blood loss (chronic): Secondary | ICD-10-CM

## 2023-10-16 DIAGNOSIS — D509 Iron deficiency anemia, unspecified: Secondary | ICD-10-CM

## 2023-10-16 DIAGNOSIS — R195 Other fecal abnormalities: Secondary | ICD-10-CM | POA: Diagnosis not present

## 2023-10-16 NOTE — Progress Notes (Signed)
 SN: [id number] Exp: 2024-07-20 LOT: [id number]  Patient arrived for VCE. Reported the prep went well. This RN explained capsule dietary restrictions for the next few hours. Pt advised to return at 4 pm to return capsule equipment.  Patient verbalized understanding. Opened capsule, ensured capsule was flashing prior to the patient swallowing the capsule. Patient swallowed capsule without difficulty.  Patient told to call the office with any questions and if capsule has not passed after 72 hours. No further questions by the conclusion of the visit.

## 2023-10-16 NOTE — Progress Notes (Signed)
 ANNUAL DIABETIC FOOT EXAM  Subjective: Jamie Cox presents today for annual diabetic foot exam. Chief Complaint  Patient presents with   Diabetes    "Cut my toenails and treat my calluses."   Dr. Harwood Lingo - 10/09/2023;  A1c - 6.7   Patient confirms h/o diabetes.  Patient denies any h/o foot wounds.  Patient has been diagnosed with neuropathy.  Neda Balk, MD is patient's PCP.  Past Medical History:  Diagnosis Date   Allergy    Asthma    Atypical chest pain    Chronic sinusitis    Followed by Dr. 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   History of chicken pox    Hypercalcemia 07/19/2015   Hypertension    Osteoporosis, unspecified    steroid induced   Personal history of other diseases of circulatory system    Rheumatoid arthritis(714.0)    Unspecified deficiency anemia    microcytic   Urticaria    Patient Active Problem List   Diagnosis Date Noted   Vitamin D  deficiency 06/17/2023   BRBPR (bright red blood per rectum) 06/17/2023   Pernicious anemia 06/17/2023   Pain of right lower leg 06/17/2023   CAP (community acquired pneumonia) 11/25/2022   Fracture of middle phalanx of finger 07/07/2022   Left otitis media 04/12/2022   Bronchitis 04/12/2022   Paresthesias 12/01/2021   Peroneal tendinitis of right lower extremity 07/09/2021   Leg length discrepancy 07/09/2021   Controlled type 2 diabetes mellitus without complication, without long-term current use of insulin  (HCC) 09/28/2020   Chronic rhinitis 09/23/2020   Bilateral impacted cerumen 08/03/2020   COVID-19 09/19/2019   Atypical chest pain 08/09/2017   Onychomycosis 05/08/2017   Cough variant asthma with possible UACS component  05/05/2017   Asthma, chronic obstructive, with acute exacerbation (HCC) 02/13/2017   Allergy 08/03/2016   Maxillary sinusitis, chronic 08/03/2016   Bronchiectasis with (acute)  exacerbation (HCC) 06/29/2016   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   Cervical cancer screening 03/04/2016   Hypercalcemia 07/19/2015   History of chicken pox    On prednisone  therapy 09/25/2013   Intercostal muscle pain 09/20/2013   Preventative health care 08/06/2013   GERD (gastroesophageal reflux disease) 08/01/2012   Tachycardia 07/28/2012   Health care maintenance 01/31/2012   Chronic sinusitis 01/31/2012   High risk medication use 08/17/2011   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   Chronic cough 09/06/2010   Anemia 06/23/2006   Osteoporosis 06/23/2006   Past Surgical History:  Procedure Laterality Date   NASAL SINUS SURGERY  06/2012   NASAL SINUS SURGERY     2010   OPEN REDUCTION INTERNAL FIXATION (ORIF) METACARPAL Right 07/13/2022   Procedure: OPEN REDUCTION INTERNAL FIXATION METACARPAL RIGHT SMALL FINGER;  Surgeon: Arvil Birks, MD;  Location: [facility] OR;  Service: Orthopedics;  Laterality: Right;  regional with iv sedation   Current Outpatient Medications on File Prior to Visit  Medication Sig Dispense Refill   albuterol  (VENTOLIN  HFA) 108 (90 Base) MCG/ACT inhaler INHALE ONE TO TWO PUFFS INTO THE LUNGS EVERY 6 HOURS AS NEEDED FOR WHEEZING OR SHORTNESS OF BREATH 18 each 5   atorvastatin  (LIPITOR) 10 MG tablet Take 1 tablet by mouth once daily 90 tablet 0   Blood Glucose Monitoring Suppl DEVI 1 each by Does not apply route 3 (three) times daily. May substitute to any manufacturer covered by patient's insurance. 1 each 0  budesonide -formoterol  (SYMBICORT ) 160-4.5 MCG/ACT inhaler Inhale 2 puffs into the lungs 2 (two) times daily. 10.2 g 12   calcium -vitamin D  (OSCAL WITH D) 500-5 MG-MCG tablet Take 1 tablet by mouth daily with breakfast.     famotidine  (PEPCID ) 40 MG tablet TAKE 1 TABLET BY MOUTH EVERY DAY 90 tablet 1   Ferrous Fumarate -Folic Acid  (HEMATINIC/FOLIC ACID ) 324-1 MG TABS Take 1 tablet by mouth daily. 90 tablet 1   fluticasone  (FLONASE ) 50 MCG/ACT  nasal spray Place 2 sprays into both nostrils daily. 16 g 5   gabapentin  (NEURONTIN ) 300 MG capsule Take 1 capsule (300 mg total) by mouth 2 (two) times daily as needed. 180 capsule 1   Glucose Blood (BLOOD GLUCOSE TEST STRIPS) STRP 1 each by Does not apply route 3 (three) times daily. May substitute to any manufacturer covered by patient's insurance. 100 strip 3   HYDROcodone -acetaminophen  (NORCO/VICODIN) 5-325 MG tablet Take 1 tablet by mouth every 6 (six) hours as needed for moderate pain (pain score 4-6) or severe pain (pain score 7-10). 40 tablet 0   Lancet Device MISC 1 each by Does not apply route in the morning, at noon, and at bedtime. May substitute to any manufacturer covered by patient's insurance. 1 each 0   Lancets Misc. MISC 1 each by Does not apply route in the morning, at noon, and at bedtime. May substitute to any manufacturer covered by patient's insurance. 100 each 3   levocetirizine (XYZAL ) 5 MG tablet Take 1 tablet (5 mg total) by mouth every evening. 30 tablet 4   meloxicam  (MOBIC ) 7.5 MG tablet TAKE 1 TO 2 TABLETS BY MOUTH ONCE DAILY AS NEEDED FOR PAIN 60 tablet 03   metoprolol  succinate (TOPROL -XL) 25 MG 24 hr tablet Take 1 tablet (25 mg total) by mouth daily. 90 tablet 1   montelukast  (SINGULAIR ) 10 MG tablet TAKE 1 TABLET BY MOUTH AT BEDTIME 90 tablet 0   pantoprazole  (PROTONIX ) 20 MG tablet Take 1 tablet (20 mg total) by mouth daily before breakfast. 90 tablet 3   predniSONE  (DELTASONE ) 2.5 MG tablet Take 1 tablet by mouth once daily with breakfast 90 tablet 0   Pseudoephedrine-Guaifenesin  (MUCINEX  D MAX STRENGTH) 539 140 1581 MG TB12 Take 1 tablet by mouth 2 (two) times daily.     Current Facility-Administered Medications on File Prior to Visit  Medication Dose Route Frequency Provider Last Rate Last Admin   [START ON 10/24/2023] denosumab  (PROLIA ) injection 60 mg  60 mg Subcutaneous Q6 months Neda Balk, MD        Allergies  Allergen Reactions   Augmentin   [Amoxicillin -Pot Clavulanate] Hives, Itching and Swelling   Avelox [Moxifloxacin Hcl In Nacl] Diarrhea, Nausea And Vomiting and Other (See Comments)    Dizziness, Cold chills    Bactrim  [Sulfamethoxazole -Trimethoprim ] Hives   Doxycycline      Abdominal discomfort, anorexia   Sulfamethoxazole -Trimethoprim  Hives   Amoxicillin  Other (See Comments)   Social History   Occupational History   Occupation: Data processing manager  Tobacco Use   Smoking status: Never   Smokeless tobacco: Never  Vaping Use   Vaping status: Never Used  Substance and Sexual Activity   Alcohol use: No    Alcohol/week: 0.0 standard drinks of alcohol   Drug use: No   Sexual activity: Not Currently   Family History  Problem Relation Age of Onset   Scleroderma Mother    Heart disease Mother    Kidney disease Mother    Other Mother    Prostate cancer Father  Diabetes Father    Heart disease Father        CHF   Arthritis Father        s/p hip replacement   Cancer Father        lung cancer with mets, smoker   Other Sister        Cardiomegaly   Arthritis Sister    Heart disease Sister        rare   Sleep apnea Brother    Heart disease Brother    Lung cancer Paternal Uncle    Liver cancer Maternal Grandmother    Hypertension Maternal Grandmother    Heart attack Maternal Grandmother    Coronary artery disease Paternal Grandmother    Hypertension Paternal Grandmother    Liver cancer Paternal Grandmother    Alzheimer's disease Paternal Grandfather    Arthritis Daughter    Hyperlipidemia Son    Lupus Cousin    Arthritis Cousin    Colon cancer Neg Hx    Asthma Neg Hx    Allergic rhinitis Neg Hx    Eczema Neg Hx    Immunodeficiency Neg Hx    Angioedema Neg Hx    Esophageal cancer Neg Hx    Rectal cancer Neg Hx    Stomach cancer Neg Hx    Colon polyps Neg Hx    Immunization History  Administered Date(s) Administered   Influenza Split 04/05/2011   Influenza Whole 04/10/2008, 05/15/2009, 05/21/2010,  02/05/2012   Influenza, Seasonal, Injecte, Preservative Fre 02/28/2023   Influenza,inj,Quad PF,6+ Mos 08/06/2013, 05/09/2016, 04/10/2018   Janssen (J&J) SARS-COV-2 Vaccination 08/17/2019   PFIZER Comirnaty Martina Sledge Top)Covid-19 Tri-Sucrose Vaccine 10/27/2020   PNEUMOCOCCAL CONJUGATE-20 09/28/2020   Pfizer Covid-19 Vaccine Bivalent Booster 103yrs & up 06/11/2021   Pfizer(Comirnaty )Fall Seasonal Vaccine 12 years and older 02/28/2023   Pneumococcal Polysaccharide-23 07/01/2010   Td 05/15/2009   Zoster Recombinant(Shingrix ) 07/07/2023     Review of Systems: Negative except as noted in the HPI.   Objective: There were no vitals filed for this visit.  Jamie Cox is a pleasant 63 y.o. female in NAD. AAO X 3.  Diabetic foot exam was performed with the following findings:   Vascular Examination: Capillary refill time immediate b/l. Vascular status intact b/l with palpable pedal pulses. Pedal hair present b/l. No pain with calf compression b/l. Skin temperature gradient WNL b/l. No cyanosis or clubbing b/l. No ischemia or gangrene noted b/l.   Neurological Examination: Sensation grossly intact b/l with 10 gram monofilament. Vibratory sensation intact b/l.   Dermatological Examination: Pedal skin with normal turgor, texture and tone b/l.  No open wounds. No interdigital macerations.   Toenails 1-5 b/l thick, discolored, elongated with subungual debris and pain on dorsal palpation.   Porokeratotic lesion(s) submet head 3 b/l and submet head 5 left foot. No erythema, no edema, no drainage, no fluctuance.  Musculoskeletal Examination: Muscle strength 5/5 to all lower extremity muscle groups bilaterally. HAV with bunion bilaterally and hammertoes 2-5 b/l. Patient ambulates independent of any assistive aids.  Radiographs: None     Lab Results  Component Value Date   HGBA1C 6.7 (H) 10/09/2023   ADA Risk Categorization: Low Risk :  Patient has all of the following: Intact protective  sensation No prior foot ulcer  No severe deformity Pedal pulses present  Assessment: 1. Pain due to onychomycosis of toenail   2. Porokeratosis   3. Hallux valgus, acquired, bilateral   4. Acquired hammertoes of both feet   5. Controlled type 2  diabetes mellitus without complication, without long-term current use of insulin  (HCC)   6. Encounter for diabetic foot exam [facility])     Plan: Patient was evaluated and treated. All patient's and/or POA's questions/concerns addressed on today's visit. Toenails 1-5 debrided in length and girth without incident. Porokeratotic lesion(s) right great toe, submet head 3 b/l, and submet head 5 left foot pared with sharp debridement without incident. Continue daily foot inspections and monitor blood glucose per PCP/Endocrinologist's recommendations. Continue soft, supportive shoe gear daily. Report any pedal injuries to medical professional. Call office if there are any questions/concerns. -Patient/POA to call should there be question/concern in the interim. Return in about 3 months (around 01/12/2024).  Luella Sager, DPM      [place] LOCATION: [address] [place] [address]                   Office [phone]   [facility] LOCATION: [address] [place], [place] [address] Office [phone]

## 2023-10-16 NOTE — Patient Instructions (Signed)
 You may have clear liquids beginning at 10:30 am after ingesting the capsule.    You can have a light lunch at 12:30 pm; sandwich and half bowl of soup.  Return to the office at 4 pm to return the equipment.   Return to you normal diet at 5 pm.   Call [phone] and ask for Hilma Favors BSN RN if you have any questions.  You should pass the capsule in your stool 8-48 hours after ingestion. If you have not passed the capsule, after 72 hours, please contact the office at [phone].

## 2023-10-27 ENCOUNTER — Telehealth: Payer: Self-pay | Admitting: Family Medicine

## 2023-10-27 NOTE — Telephone Encounter (Signed)
 Copied from CRM [id number]. Topic: Appointments - Scheduling Inquiry for Clinic >> Oct 27, 2023 10:22 AM Lajean Pike wrote: Reason for CRM: Patient called with a scheduled appointment for a Prolia  shot next Thursday. She is inquiring if she may also schedule a B12 shot on the same day.  Patient may be contacted at [phone] (M).

## 2023-10-27 NOTE — Telephone Encounter (Signed)
 Returned patients call and no answer left detailed vm per last labs Dr. Rodrick Clapper advised her to avoid taking Vit B12.

## 2023-11-02 ENCOUNTER — Ambulatory Visit (INDEPENDENT_AMBULATORY_CARE_PROVIDER_SITE_OTHER)

## 2023-11-02 DIAGNOSIS — M810 Age-related osteoporosis without current pathological fracture: Secondary | ICD-10-CM

## 2023-11-02 MED ORDER — DENOSUMAB 60 MG/ML ~~LOC~~ SOSY
60.0000 mg | PREFILLED_SYRINGE | SUBCUTANEOUS | Status: AC
  Administered 2024-05-16: 60 mg via SUBCUTANEOUS

## 2023-11-02 NOTE — Progress Notes (Signed)
 Pt was in office today for Prolia  injection. Injection was given subcutaneous in R arm, pt tolerated well.

## 2023-11-03 ENCOUNTER — Ambulatory Visit (INDEPENDENT_AMBULATORY_CARE_PROVIDER_SITE_OTHER)

## 2023-11-03 ENCOUNTER — Encounter: Payer: Self-pay | Admitting: Family Medicine

## 2023-11-03 ENCOUNTER — Encounter (HOSPITAL_COMMUNITY): Payer: Self-pay

## 2023-11-03 ENCOUNTER — Other Ambulatory Visit: Payer: Self-pay | Admitting: Family Medicine

## 2023-11-03 ENCOUNTER — Ambulatory Visit (HOSPITAL_COMMUNITY)
Admission: EM | Admit: 2023-11-03 | Discharge: 2023-11-03 | Disposition: A | Discharge status: Home / Self Care | Attending: Nurse Practitioner | Admitting: Nurse Practitioner

## 2023-11-03 ENCOUNTER — Ambulatory Visit: Payer: Self-pay | Admitting: Family Medicine

## 2023-11-03 DIAGNOSIS — R053 Chronic cough: Secondary | ICD-10-CM

## 2023-11-03 DIAGNOSIS — M069 Rheumatoid arthritis, unspecified: Secondary | ICD-10-CM

## 2023-11-03 DIAGNOSIS — J471 Bronchiectasis with (acute) exacerbation: Secondary | ICD-10-CM | POA: Diagnosis not present

## 2023-11-03 DIAGNOSIS — T148XXA Other injury of unspecified body region, initial encounter: Secondary | ICD-10-CM | POA: Diagnosis not present

## 2023-11-03 MED ORDER — METHOCARBAMOL 500 MG PO TABS: 500.0000 mg | ORAL_TABLET | Freq: Two times a day (BID) | ORAL | 0 refills | Status: AC

## 2023-11-03 MED ORDER — HYDROCODONE-ACETAMINOPHEN 5-325 MG PO TABS: 1.0000 | ORAL_TABLET | Freq: Four times a day (QID) | ORAL | 0 refills | Status: DC | PRN

## 2023-11-03 MED ORDER — PREDNISONE 10 MG PO TABS: 20.0000 mg | ORAL_TABLET | Freq: Every day | ORAL | 0 refills | Status: AC

## 2023-11-03 NOTE — Telephone Encounter (Signed)
 Copied from CRM [id number]. Topic: Clinical - Medication Refill >> Nov 03, 2023 10:12 AM Jamie Cox wrote: Medication: HYDROcodone -acetaminophen  (NORCO/VICODIN) 5-325 MG tablet [[id number]]  Has the patient contacted their pharmacy? Yes (Agent: If no, request that the patient contact the pharmacy for the refill. If patient does not wish to contact the pharmacy document the reason why and proceed with request.) (Agent: If yes, when and what did the pharmacy advise?) Told to call pharmacy  This is the patient's preferred pharmacy:  [id number] - [place] - [address]  ([place]) [place] [address] Phone: [phone] Fax: [phone]   Is this the correct pharmacy for this prescription? Yes If no, delete pharmacy and type the correct one.   Has the prescription been filled recently? No  Is the patient out of the medication? Yes  Has the patient been seen for an appointment in the last year OR does the patient have an upcoming appointment? Yes  Can we respond through MyChart? Yes  Agent: Please be advised that Rx refills may take up to 3 business days. We ask that you follow-up with your pharmacy.

## 2023-11-03 NOTE — Telephone Encounter (Signed)
 Requesting: hydrocodone  5-325mg   Contract: 12/07/22 UDS: 12/07/22 Last Visit: 10/09/23 Next Visit: 01/29/24 Last Refill: 06/08/23 #40 and 0RF   Please Advise

## 2023-11-03 NOTE — ED Triage Notes (Signed)
 Pt c/o bilateral arm/neck/upper back/bilateral shoulder pain x3 days. States has a chronic cough and hurts to cough. Took hydrocodone  last night with relief.

## 2023-11-03 NOTE — ED Provider Notes (Signed)
 [facility]    CSN: [id number] Arrival date & time: 11/03/23  1608      History   Chief Complaint Chief Complaint  Patient presents with   Back Pain    HPI Jamie Cox is a 63 y.o. female.   Jamie Cox is a 63 y.o. female with a history of rheumatoid arthritis, bronchiectasis, and osteoporosis, presents with complaints of shoulder, back, and arm pain, as well as a worsening chronic cough. The patient reports waking up a few days ago with pain in both arms, upper back, and shoulders. She describes pain behind the shoulder blade area, with the left side pain radiating down her left arm, and the right shoulder blade area pain extending into the right arm and back of her neck. The patient denies any numbness or tingling in her arms, reporting only pain. She states that the pain is not exacerbated by touch or movement. The patient has been using her prescribed pain medications, including hydrocodone  and meloxicam , which provide some relief.   In addition to the musculoskeletal pain, the patient reports a worsening of her chronic cough over the past few weeks. She occasionally experiences wheezing and shortness of breath, for which she uses her inhaler. The patient denies fever, chills, body aches, productive cough, runny nose, nasal congestion, or sore throat. She has not tried any heat or ice therapy for her pain.  The patient's medical history is significant for rheumatoid arthritis, for which she takes low-dose prednisone  (2.5 mg daily). She also has chronic bronchiectasis, which contributes to her chronic cough, and is managed with Symbicort , Flonase , , montelukast , and pseudoephedrine-guaifenesin . Additionally, she has osteoporosis and is on Prolia . The patient denies smoking or vaping.  The following portions of the patient's history were reviewed and updated as appropriate: allergies, current medications, past family history, past medical history, past social history,  past surgical history, and problem list .    Past Medical History:  Diagnosis Date   Allergy    Asthma    Atypical chest pain    Chronic sinusitis    Followed by Dr. 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   History of chicken pox    Hypercalcemia 07/19/2015   Hypertension    Osteoporosis, unspecified    steroid induced   Personal history of other diseases of circulatory system    Rheumatoid arthritis(714.0)    Unspecified deficiency anemia    microcytic   Urticaria     Patient Active Problem List   Diagnosis Date Noted   Vitamin D  deficiency 06/17/2023   BRBPR (bright red blood per rectum) 06/17/2023   Pernicious anemia 06/17/2023   Pain of right lower leg 06/17/2023   CAP (community acquired pneumonia) 11/25/2022   Fracture of middle phalanx of finger 07/07/2022   Left otitis media 04/12/2022   Bronchitis 04/12/2022   Paresthesias 12/01/2021   Peroneal tendinitis of right lower extremity 07/09/2021   Leg length discrepancy 07/09/2021   Controlled type 2 diabetes mellitus without complication, without long-term current use of insulin  (HCC) 09/28/2020   Chronic rhinitis 09/23/2020   Bilateral impacted cerumen 08/03/2020   COVID-19 09/19/2019   Atypical chest pain 08/09/2017   Onychomycosis 05/08/2017   Cough variant asthma with possible UACS component  05/05/2017   Asthma, chronic obstructive, with acute exacerbation (HCC) 02/13/2017   Allergy 08/03/2016   Maxillary sinusitis, chronic 08/03/2016   Bronchiectasis with (acute) exacerbation (  HCC) 06/29/2016   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   Cervical cancer screening 03/04/2016   Hypercalcemia 07/19/2015   History of chicken pox    On prednisone  therapy 09/25/2013   Intercostal muscle pain 09/20/2013   Preventative health care 08/06/2013   GERD (gastroesophageal reflux disease) 08/01/2012   Tachycardia  07/28/2012   Health care maintenance 01/31/2012   Chronic sinusitis 01/31/2012   High risk medication use 08/17/2011   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      Home Medications    Prior to Admission medications   Medication Sig Start Date End Date Taking? Authorizing Provider  methocarbamol (ROBAXIN) 500 MG tablet Take 1 tablet (500 mg total) by mouth 2 (two) times daily for 5 days. 11/03/23 11/08/23 Yes Zaim Nitta, FNP  predniSONE  (DELTASONE ) 10 MG tablet Take 2 tablets (20 mg total) by mouth daily with breakfast for 5 days. Hold 2.5 mg dose while on this, may resume on 11/09/23 11/03/23 11/08/23 Yes Wofford Stratton, Ova Bloomer, FNP  albuterol  (VENTOLIN  HFA) 108 (90 Base) MCG/ACT inhaler INHALE ONE TO TWO PUFFS INTO THE LUNGS EVERY 6 HOURS AS NEEDED FOR WHEEZING OR SHORTNESS OF BREATH 06/15/23   Neda Balk, MD  atorvastatin  (LIPITOR) 10 MG tablet Take 1 tablet by mouth once daily 07/10/23   Neda Balk, MD  Blood Glucose Monitoring Suppl DEVI 1 each by Does not apply route 3 (three) times daily. May substitute to any manufacturer covered by patient's insurance. 10/09/23   Neda Balk, MD  budesonide -formoterol  (SYMBICORT ) 160-4.5 MCG/ACT inhaler Inhale 2 puffs into the lungs 2 (two) times daily. 11/26/22   Ghimire, Estil Heman, MD  calcium -vitamin D  (OSCAL WITH D) 500-5 MG-MCG tablet Take 1 tablet by mouth daily with breakfast.    [provider]  famotidine  (PEPCID ) 40 MG tablet TAKE 1  TABLET BY MOUTH EVERY DAY 05/09/23   Neda Balk, MD  Ferrous Fumarate -Folic Acid  (HEMATINIC/FOLIC ACID ) 324-1 MG TABS Take 1 tablet by mouth daily. 08/07/23   Neda Balk, MD  fluticasone  (FLONASE ) 50 MCG/ACT nasal spray Place 2 sprays into both nostrils daily. 06/15/23   Neda Balk, MD  gabapentin  (NEURONTIN ) 300 MG capsule Take 1 capsule (300 mg total) by mouth 2 (two) times daily as needed. 06/15/23   Neda Balk, MD  Glucose Blood (BLOOD GLUCOSE TEST STRIPS) STRP 1 each by Does not apply route 3 (three) times daily. May substitute to any manufacturer covered by patient's insurance. 10/09/23   Neda Balk, MD  HYDROcodone -acetaminophen  (NORCO/VICODIN) 5-325 MG tablet Take 1 tablet by mouth every 6 (six) hours as needed for moderate pain (pain score 4-6) or severe pain (pain score 7-10). 11/03/23   Webb, Padonda B, FNP  Lancet Device MISC 1 each by Does not apply route in the morning, at noon, and at bedtime. May substitute to any manufacturer covered by patient's insurance. 10/09/23 11/08/23  Neda Balk, MD  Lancets Misc. MISC 1  each by Does not apply route in the morning, at noon, and at bedtime. May substitute to any manufacturer covered by patient's insurance. 10/09/23 11/08/23  Neda Balk, MD  levocetirizine (XYZAL ) 5 MG tablet Take 1 tablet (5 mg total) by mouth every evening. 07/11/23   Desai, Nikita S, MD  meloxicam  (MOBIC ) 7.5 MG tablet TAKE 1 TO 2 TABLETS BY MOUTH ONCE DAILY AS NEEDED FOR PAIN 06/15/23   Neda Balk, MD  metoprolol  succinate (TOPROL -XL) 25 MG 24 hr tablet Take 1 tablet (25 mg total) by mouth daily. 07/14/23   Neda Balk, MD  montelukast  (SINGULAIR ) 10 MG tablet TAKE 1 TABLET BY MOUTH AT BEDTIME 10/04/23   Neda Balk, MD  pantoprazole  (PROTONIX ) 20 MG tablet Take 1 tablet (20 mg total) by mouth daily before breakfast. 08/24/23   Collier, Amanda R, PA-C  predniSONE  (DELTASONE ) 2.5 MG tablet Take 1 tablet by mouth once daily with breakfast 10/17/23   Webb,  Padonda B, FNP  Pseudoephedrine-Guaifenesin  (MUCINEX  D MAX STRENGTH) [phone] MG TB12 Take 1 tablet by mouth 2 (two) times daily.    [provider]    Family History Family History  Problem Relation Age of Onset   Scleroderma Mother    Heart disease Mother    Kidney disease Mother    Other Mother    Prostate cancer Father    Diabetes Father    Heart disease Father        CHF   Arthritis Father        s/p hip replacement   Cancer Father        lung cancer with mets, smoker   Other Sister        Cardiomegaly   Arthritis Sister    Heart disease Sister        rare   Sleep apnea Brother    Heart disease Brother    Lung cancer Paternal Uncle    Liver cancer Maternal Grandmother    Hypertension Maternal Grandmother    Heart attack Maternal Grandmother    Coronary artery disease Paternal Grandmother    Hypertension Paternal Grandmother    Liver cancer Paternal Grandmother    Alzheimer's disease Paternal Grandfather    Arthritis Daughter    Hyperlipidemia Son    Lupus Cousin    Arthritis Cousin    Colon cancer Neg Hx    Asthma Neg Hx    Allergic rhinitis Neg Hx    Eczema Neg Hx    Immunodeficiency Neg Hx    Angioedema Neg Hx    Esophageal cancer Neg Hx    Rectal cancer Neg Hx    Stomach cancer Neg Hx    Colon polyps Neg Hx     Social History Social History   Tobacco Use   Smoking status: Never   Smokeless tobacco: Never  Vaping Use   Vaping status: Never Used  Substance Use Topics   Alcohol use: No    Alcohol/week: 0.0 standard drinks of alcohol   Drug use: No     Allergies   Augmentin  [amoxicillin -pot clavulanate], Avelox [moxifloxacin hcl in nacl], Bactrim  [sulfamethoxazole -trimethoprim ], Doxycycline , Sulfamethoxazole -trimethoprim , and Amoxicillin    Review of Systems Review of Systems  Constitutional:  Negative for chills and fever.  HENT:  Positive for rhinorrhea. Negative for congestion and sore throat.   Respiratory:  Positive for cough  (chronic cough; worse over the past "few" weeks), shortness of breath (occasionally; improves with use of MDI) and wheezing (occasionally; improves with use  of MDI).   Musculoskeletal:  Positive for arthralgias, back pain and neck pain. Negative for myalgias.  All other systems reviewed and are negative.    Physical Exam Triage Vital Signs ED Triage Vitals  Encounter Vitals Group     BP 11/03/23 1642 (!) 173/99     Systolic BP Percentile --      Diastolic BP Percentile --      Pulse Rate 11/03/23 1642 97     Resp 11/03/23 1642 18     Temp 11/03/23 1642 98.1 F (36.7 C)     Temp Source 11/03/23 1642 Oral     SpO2 11/03/23 1642 98 %     Weight --      Height --      Head Circumference --      Peak Flow --      Pain Score 11/03/23 1643 5     Pain Loc --      Pain Education --      Exclude from Growth Chart --    No data found.  Updated Vital Signs BP (!) 151/89   Pulse 97   Temp 98.1 F (36.7 C) (Oral)   Resp 18   SpO2 98%   Visual Acuity Right Eye Distance:   Left Eye Distance:   Bilateral Distance:    Right Eye Near:   Left Eye Near:    Bilateral Near:     Physical Exam Vitals reviewed.  Constitutional:      General: She is awake. She is not in acute distress.    Appearance: Normal appearance. She is well-developed. She is not ill-appearing or toxic-appearing.  HENT:     Head: Normocephalic.     Mouth/Throat:     Mouth: Mucous membranes are moist.  Eyes:     Conjunctiva/sclera: Conjunctivae normal.  Cardiovascular:     Rate and Rhythm: Normal rate and regular rhythm.     Heart sounds: Normal heart sounds.  Pulmonary:     Effort: Pulmonary effort is normal. No tachypnea, accessory muscle usage or respiratory distress.     Breath sounds: Normal breath sounds. No decreased air movement. No decreased breath sounds.  Musculoskeletal:        General: Normal range of motion.     Right shoulder: Tenderness (posteriorly) present. No swelling, deformity,  effusion, laceration, bony tenderness or crepitus. Normal range of motion. Normal strength.     Left shoulder: Tenderness (posteriorly and laterally) present. No swelling, deformity, effusion, laceration, bony tenderness or crepitus. Normal range of motion. Normal strength.     Right upper arm: Normal.     Left upper arm: Normal.     Cervical back: Normal range of motion and neck supple. No rigidity. Pain with movement and muscular tenderness present. Normal range of motion.     Thoracic back: Normal.     Lumbar back: Normal.  Lymphadenopathy:     Cervical: No cervical adenopathy.  Skin:    General: Skin is warm and dry.  Neurological:     General: No focal deficit present.     Mental Status: She is alert and oriented to person, place, and time.  Psychiatric:        Behavior: Behavior is cooperative.      UC Treatments / Results  Labs (all labs ordered are listed, but only abnormal results are displayed) Labs Reviewed - No data to display  EKG   Radiology No results found.  Procedures Procedures (including critical care time)  Medications  Ordered in UC Medications - No data to display  Initial Impression / Assessment and Plan / UC Course  I have reviewed the triage vital signs and the nursing notes.  Pertinent labs & imaging results that were available during my care of the patient were reviewed by me and considered in my medical decision making (see chart for details).     Patient presents with diffuse musculoskeletal pain involving the shoulders, arms, back, and neck that began a few days ago without any known injury. No numbness or tingling reported. History of rheumatoid arthritis managed with daily low-dose prednisone . Currently using hydrocodone  and meloxicam  with partial relief. Plan is to hold prednisone  2.5 mg starting tomorrow and increase to 20 mg daily for five days, then resume baseline dose on June 5. Methocarbamol prescribed for muscle relaxation. Continue  current pain regimen, use heating pad for relief, and increase fluid intake.  Patient also reports worsening of chronic cough over the past few weeks with a history of bronchiectasis and recurrent walking pneumonia. She denies systemic symptoms or productive cough. Oxygen saturation is 98% on room air. Current medications include Symbicort , Flonase , montelukast , and pseudoephedrine-guaifenesin , with intermittent use of rescue inhaler. A chest X-ray ordered to rule out pneumonia and results pending. She will continue current pulmonary medications. Antibiotics will be prescribed if imaging suggests infection.  Patient has history of hypertension noted with elevated BP readings, initially 173/99 and most recent at 151/89  on recheck, possibly pain-related. She will continue her current antihypertensive medication and monitor her blood pressure.  Today's evaluation has revealed no signs of a dangerous process. Discussed diagnosis with patient and/or guardian. Patient and/or guardian aware of their diagnosis, possible red flag symptoms to watch out for and need for close follow up. Patient and/or guardian understands verbal and written discharge instructions. Patient and/or guardian comfortable with plan and disposition.  Patient and/or guardian has a clear mental status at this time, good insight into illness (after discussion and teaching) and has clear judgment to make decisions regarding their care  Documentation was completed with the aid of voice recognition software. Transcription may contain typographical errors.  Final Clinical Impressions(s) / UC Diagnoses   Final diagnoses:  Chronic cough  Bronchiectasis with (acute) exacerbation (HCC)  Muscle strain     Discharge Instructions      You were seen today for pain involving your shoulders, arms, back, and neck that began a few days ago without any specific injury. There are no signs of nerve involvement such as numbness or tingling. You have  a history of rheumatoid arthritis and are currently taking low-dose prednisone  daily. You've also been using hydrocodone  and meloxicam  for pain, which has provided some relief.  To help manage your pain, you should stop taking your usual prednisone  dose of 2.5 mg starting tomorrow. Instead, start the prescribed 20 mg of prednisone  once daily for five days. After completing this short course, resume your regular 2.5 mg dose on Thursday, June 5th. You were also prescribed methocarbamol (Robaxin) to take twice a day for five days to help relax your muscles. Continue using your current pain medications as prescribed. You may use a heating pad to help relieve muscle tightness and discomfort. Be sure to drink plenty of fluids while taking your medications and to support overall wellness.  You also reported worsening of a chronic cough that has been more noticeable over the past few weeks. You have a known history of bronchiectasis and recurrent walking pneumonia. You are currently taking  Symbicort , Flonase , montelukast , and pseudoephedrine-guaifenesin  for cough management, along with occasional use of a rescue inhaler. You do not have symptoms such as fever, chills, sputum production, or nasal congestion at this time. Your oxygen level was 98% during today's visit. To rule out any lung infection, a chest X-ray has been ordered and the results are currently pending. Please continue taking your current lung medications as prescribed. If the X-ray shows any signs of pneumonia or another infection, an antibiotic will be prescribed.   Your blood pressure today was elevated, initially 173/99 and improved some on recheck at 151/89. This increase may be related to your current pain levels. Please continue taking your blood pressure medication as prescribed and monitor your readings closely at home if possible. If your blood pressure remains consistently high or worsens, follow up with your primary care provider.     ED  Prescriptions     Medication Sig Dispense Auth. Provider   predniSONE  (DELTASONE ) 10 MG tablet Take 2 tablets (20 mg total) by mouth daily with breakfast for 5 days. Hold 2.5 mg dose while on this, may resume on 11/09/23 10 tablet Maryruth Sol, FNP   methocarbamol  (ROBAXIN ) 500 MG tablet Take 1 tablet (500 mg total) by mouth 2 (two) times daily for 5 days. 10 tablet Maryruth Sol, FNP      PDMP not reviewed this encounter.   Maryruth Sol, [place] 11/03/23 [phone]

## 2023-11-03 NOTE — Discharge Instructions (Addendum)
 You were seen today for pain involving your shoulders, arms, back, and neck that began a few days ago without any specific injury. There are no signs of nerve involvement such as numbness or tingling. You have a history of rheumatoid arthritis and are currently taking low-dose prednisone  daily. You've also been using hydrocodone  and meloxicam  for pain, which has provided some relief.  To help manage your pain, you should stop taking your usual prednisone  dose of 2.5 mg starting tomorrow. Instead, start the prescribed 20 mg of prednisone  once daily for five days. After completing this short course, resume your regular 2.5 mg dose on Thursday, June 5th. You were also prescribed methocarbamol (Robaxin) to take twice a day for five days to help relax your muscles. Continue using your current pain medications as prescribed. You may use a heating pad to help relieve muscle tightness and discomfort. Be sure to drink plenty of fluids while taking your medications and to support overall wellness.  You also reported worsening of a chronic cough that has been more noticeable over the past few weeks. You have a known history of bronchiectasis and recurrent walking pneumonia. You are currently taking Symbicort , Flonase , montelukast , and pseudoephedrine-guaifenesin  for cough management, along with occasional use of a rescue inhaler. You do not have symptoms such as fever, chills, sputum production, or nasal congestion at this time. Your oxygen level was 98% during today's visit. To rule out any lung infection, a chest X-ray has been ordered and the results are currently pending. Please continue taking your current lung medications as prescribed. If the X-ray shows any signs of pneumonia or another infection, an antibiotic will be prescribed.   Your blood pressure today was elevated, initially 173/99 and improved some on recheck at 151/89. This increase may be related to your current pain levels. Please continue taking your  blood pressure medication as prescribed and monitor your readings closely at home if possible. If your blood pressure remains consistently high or worsens, follow up with your primary care provider.

## 2023-11-06 ENCOUNTER — Encounter: Payer: Self-pay | Admitting: Gastroenterology

## 2023-11-07 ENCOUNTER — Telehealth: Payer: Self-pay | Admitting: *Deleted

## 2023-11-07 NOTE — Telephone Encounter (Signed)
 Left message on machine to call back to see if pt would like to change over to [name], NP for Friday or like to have a sooner appointment for tomorrow or Thursday.

## 2023-11-08 ENCOUNTER — Telehealth: Payer: Self-pay

## 2023-11-08 ENCOUNTER — Other Ambulatory Visit (HOSPITAL_COMMUNITY): Payer: Self-pay

## 2023-11-08 NOTE — Telephone Encounter (Signed)
 Pharmacy Patient Advocate Encounter   Received notification from Onbase that prior authorization for HYDROcodone -Acetaminophen  5-325MG  tablets  is required/requested.   Insurance verification completed.   The patient is insured through [employer] .   Per test claim: PA required; PA submitted to above mentioned insurance via CoverMyMeds Key/confirmation #/EOC [id number] Status is pending

## 2023-11-09 ENCOUNTER — Other Ambulatory Visit (HOSPITAL_COMMUNITY): Payer: Self-pay

## 2023-11-09 NOTE — Telephone Encounter (Signed)
 Appt has been changed

## 2023-11-09 NOTE — Telephone Encounter (Signed)
 Pharmacy Patient Advocate Encounter  Received notification from [facility] that Prior Authorization for HYDROcodone -Acetaminophen  5-325MG  tablets  has been APPROVED from 11/08/23 to 05/09/24. Ran test claim, Copay is $2.61. This test claim was processed through [facility]- copay amounts may vary at other pharmacies due to pharmacy/plan contracts, or as the patient moves through the different stages of their insurance plan.   PA #/Case ID/Reference #: [id number]

## 2023-11-09 NOTE — Progress Notes (Unsigned)
 Subjective:     Patient ID: Jamie Cox, female    DOB: 10/11/1960, 63 y.o.   MRN: [id number]  No chief complaint on file.   HPI  Jamie Cox is a 63 y.o. female presents with complaints of shoulder, back, and arm pain, as well as a worsening chronic cough.   She was seen on 11/03/2023 at urgent care for chronic cough; Chest X-ray negative.  Was also experiencing musculoskeletal pain involving the shoulders, arms, back, and neck that began a few days ago without any known injury. She was given 20 mg prednisone  daily for five days, then to resume baseline dose of 2.5 mg for RA on June 5.    Rheumatoid arthritis Prednisone  2.5 mg daily.  Compliant with medication Last time she has seen rheumatology:?***  Chronic Cough With long standing history of chronic cough r/t s chronic bronchiectasis,  She has managed with Symbicort , Flonase , montelukast , and pseudoephedrine-guaifenesin  (Mucinex  D)  Osteoporosis  Prolia   *PMR--Age >= 50 years (typically > 70) ilateral aching and morning stiffness >30 minutes Shoulders (most common), hips, neck Symptoms present for >= 2 weeks May have low-grade fever, fatigue, weight loss  CThe patient reports waking up a few days ago with pain in both arms, upper back, and shoulders. She describes pain behind the shoulder blade area, with the left side pain radiating down her left arm, and the right shoulder blade area pain extending into the right arm and back of her neck. The patient denies any numbness or tingling in her arms, reporting only pain. She states that the pain is not exacerbated by touch or movement. The patient has been using her prescribed pain medications, including hydrocodone  and meloxicam , which provide some relief.    In addition to the musculoskeletal pain, the patient reports a worsening of her chronic cough over the past few weeks. She occasionally experiences wheezing and shortness of breath, for which she uses her inhaler.  The patient denies fever, chills, body aches, productive cough, runny nose, nasal congestion, or sore throat. She has not tried any heat or ice therapy for her pain.   The patient's medical history is significant for rheumatoid arthritis, for which she takes low-dose prednisone  (2.5 mg daily). She also hawhich contributes to her chronic cough, and is managed with Symbicort , Flonase , , montelukast , and pseudoephedrine-guaifenesin . Additionally, she has osteoporosis and is on Prolia . The patient denies smoking or vaping.   Review of Systems  Constitutional:  Negative for chills and fever.  HENT:  Positive for rhinorrhea. Negative for congestion and sore throat.   Respiratory:  Positive for cough (chronic cough; worse over the past "few" weeks), shortness of breath (occasionally; improves with use of MDI) and wheezing (occasionally; improves with use of MDI).   Musculoskeletal:  Positive for arthralgias, back pain and neck pain. +myalgias? All other systems reviewed and are negative.   History of Present Illness              Health Maintenance Due  Topic Date Due  . OPHTHALMOLOGY EXAM  Never done  . Cervical Cancer Screening (HPV/Pap Cotest)  03/05/2019  . DTaP/Tdap/Td (2 - Tdap) 05/16/2019  . Zoster Vaccines- Shingrix  (2 of 2) 09/01/2023    Past Medical History:  Diagnosis Date  . Allergy   . Asthma   . Atypical chest pain   . Chronic sinusitis    Followed by Dr. Tellis Feathers  . Environmental allergies   . GERD (gastroesophageal reflux disease)   . 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 History of chicken pox   . Hypercalcemia 07/19/2015  . Hypertension   . Osteoporosis, unspecified    steroid induced  . Personal history of other diseases of circulatory system   . Rheumatoid arthritis(714.0)   . Unspecified deficiency anemia    microcytic  . Urticaria     Past Surgical History:  Procedure Laterality Date  . NASAL SINUS SURGERY  06/2012  . NASAL SINUS SURGERY      2010  . OPEN REDUCTION INTERNAL FIXATION (ORIF) METACARPAL Right 07/13/2022   Procedure: OPEN REDUCTION INTERNAL FIXATION METACARPAL RIGHT SMALL FINGER;  Surgeon: Arvil Birks, MD;  Location: [facility] OR;  Service: Orthopedics;  Laterality: Right;  regional with iv sedation    Family History  Problem Relation Age of Onset  . Scleroderma Mother   . Heart disease Mother   . Kidney disease Mother   . Other Mother   . Prostate cancer Father   . Diabetes Father   . Heart disease Father        CHF  . Arthritis Father        s/p hip replacement  . Cancer Father        lung cancer with mets, smoker  . Other Sister        Cardiomegaly  . Arthritis Sister   . Heart disease Sister        rare  . Sleep apnea Brother   . Heart disease Brother   . Lung cancer Paternal Uncle   . Liver cancer Maternal Grandmother   . Hypertension Maternal Grandmother   . Heart attack Maternal Grandmother   . Coronary artery disease Paternal Grandmother   . Hypertension Paternal Grandmother   . Liver cancer Paternal Grandmother   . Alzheimer's disease Paternal Grandfather   . Arthritis Daughter   . Hyperlipidemia Son   . Lupus Cousin   . Arthritis Cousin   . Colon cancer Neg Hx   . Asthma Neg Hx   . Allergic rhinitis Neg Hx   . Eczema Neg Hx   . Immunodeficiency Neg Hx   . Angioedema Neg Hx   . Esophageal cancer Neg Hx   . Rectal cancer Neg Hx   . Stomach cancer Neg Hx   . Colon polyps Neg Hx     Social History   Socioeconomic History  . Marital status: Married    Spouse name: Not on file  . Number of children: 2  . Years of education: Not on file  . Highest education level: Not on file  Occupational History  . Occupation: Data processing manager  Tobacco Use  . Smoking status: Never  . Smokeless tobacco: Never  Vaping Use  . Vaping status: Never Used  Substance and Sexual Activity  . Alcohol use: No    Alcohol/week: 0.0 standard drinks of alcohol  . Drug use: No  . Sexual activity: Not  Currently  Other Topics Concern  . Not on file  Social History Narrative  . Not on file   Social Drivers of Health   Financial Resource Strain: Not on file  Food Insecurity: Not on file  Transportation Needs: No Transportation Needs (03/10/2023)   PRAPARE - Transportation   . Lack of Transportation (Medical): No   . Lack of Transportation (Non-Medical): No  Physical Activity: Not on file  Stress: Not on file  Social Connections: Not on file  Intimate Partner Violence: Not on file    Outpatient Medications Prior to Visit  Medication Sig Dispense Refill  . albuterol  (VENTOLIN  HFA) 108 (90 Base) MCG/ACT inhaler INHALE ONE TO TWO PUFFS INTO THE LUNGS EVERY 6 HOURS AS NEEDED FOR WHEEZING OR SHORTNESS OF BREATH 18 each 5  . atorvastatin  (LIPITOR) 10 MG tablet Take 1 tablet by mouth once daily 90 tablet 0  . Blood Glucose Monitoring Suppl DEVI 1 each by Does not apply route 3 (three) times daily. May substitute to any manufacturer covered by patient's insurance. 1 each 0  . budesonide -formoterol  (SYMBICORT ) 160-4.5 MCG/ACT inhaler Inhale 2 puffs into the lungs 2 (two) times daily. 10.2 g 12  . calcium -vitamin D  (OSCAL WITH D) 500-5 MG-MCG tablet Take 1 tablet by mouth daily with breakfast.    . famotidine  (PEPCID ) 40 MG tablet TAKE 1 TABLET BY MOUTH EVERY DAY 90 tablet 1  . Ferrous Fumarate -Folic Acid  (HEMATINIC/FOLIC ACID ) 324-1 MG TABS Take 1 tablet by mouth daily. 90 tablet 1  . fluticasone  (FLONASE ) 50 MCG/ACT nasal spray Place 2 sprays into both nostrils daily. 16 g 5  . gabapentin  (NEURONTIN ) 300 MG capsule Take 1 capsule (300 mg total) by mouth 2 (two) times daily as needed. 180 capsule 1  . Glucose Blood (BLOOD GLUCOSE TEST STRIPS) STRP 1 each by Does not apply route 3 (three) times daily. May substitute to any manufacturer covered by patient's insurance. 100 strip 3  . HYDROcodone -acetaminophen  (NORCO/VICODIN) 5-325 MG tablet Take 1 tablet by mouth every 6 (six) hours as needed for  moderate pain (pain score 4-6) or severe pain (pain score 7-10). 40 tablet 0  . levocetirizine (XYZAL ) 5 MG tablet Take 1 tablet (5 mg total) by mouth every evening. 30 tablet 4  . meloxicam  (MOBIC ) 7.5 MG tablet TAKE 1 TO 2 TABLETS BY MOUTH ONCE DAILY AS NEEDED FOR PAIN 60 tablet 03  . metoprolol  succinate (TOPROL -XL) 25 MG 24 hr tablet Take 1 tablet (25 mg total) by mouth daily. 90 tablet 1  . montelukast  (SINGULAIR ) 10 MG tablet TAKE 1 TABLET BY MOUTH AT BEDTIME 90 tablet 0  . pantoprazole  (PROTONIX ) 20 MG tablet Take 1 tablet (20 mg total) by mouth daily before breakfast. 90 tablet 3  . predniSONE  (DELTASONE ) 2.5 MG tablet Take 1 tablet by mouth once daily with breakfast 90 tablet 0  . Pseudoephedrine-Guaifenesin  (MUCINEX  D MAX STRENGTH) 9340700240 MG TB12 Take 1 tablet by mouth 2 (two) times daily.     Facility-Administered Medications Prior to Visit  Medication Dose Route Frequency Provider Last Rate Last Admin  . [START ON 04/30/2024] denosumab  (PROLIA ) injection 60 mg  60 mg Subcutaneous Q6 months Neda Balk, MD        Allergies  Allergen Reactions  . Augmentin  [Amoxicillin -Pot Clavulanate] Hives, Itching and Swelling  . Avelox [Moxifloxacin Hcl In Nacl] Diarrhea, Nausea And Vomiting and Other (See Comments)    Dizziness, Cold chills   . Bactrim  [Sulfamethoxazole -Trimethoprim ] Hives  . Doxycycline      Abdominal discomfort, anorexia  . Sulfamethoxazole -Trimethoprim  Hives  . Amoxicillin  Other (See Comments)    ROS     Objective:     Physical Exam  Physical Exam Vitals reviewed.  Constitutional:      General: She is awake. She is not in acute distress.    Appearance: Normal appearance. She is well-developed. She is not ill-appearing or toxic-appearing.  HENT:     Head: Normocephalic.     Mouth/Throat:     Mouth: Mucous membranes are moist.  Eyes:     Conjunctiva/sclera: Conjunctivae normal.  Cardiovascular:  Rate and Rhythm: Normal rate and regular rhythm.      Heart sounds: Normal heart sounds.  Pulmonary:     Effort: Pulmonary effort is normal. No tachypnea, accessory muscle usage or respiratory distress.     Breath sounds: Normal breath sounds. No decreased air movement. No decreased breath sounds.  Musculoskeletal:        General: Normal range of motion.     Right shoulder: Tenderness (posteriorly) present. No swelling, deformity, effusion, laceration, bony tenderness or crepitus. Normal range of motion. Normal strength.     Left shoulder: Tenderness (posteriorly and laterally) present. No swelling, deformity, effusion, laceration, bony tenderness or crepitus. Normal range of motion. Normal strength.     Right upper arm: Normal.     Left upper arm: Normal.     Cervical back: Normal range of motion and neck supple. No rigidity. Pain with movement and muscular tenderness present. Normal range of motion.     Thoracic back: Normal.     Lumbar back: Normal.  Lymphadenopathy:     Cervical: No cervical adenopathy.  Skin:    General: Skin is warm and dry.  Neurological:     General: No focal deficit present.     Mental Status: She is alert and oriented to person, place, and time.  Psychiatric:        Behavior: Behavior is cooperative.  There were no vitals taken for this visit. Wt Readings from Last 3 Encounters:  10/11/23 108 lb 12.8 oz (49.4 kg)  10/09/23 109 lb 6.4 oz (49.6 kg)  09/28/23 108 lb (49 kg)       Assessment & Plan:   Problem List Items Addressed This Visit   None   I am having Jamie Cox maintain her calcium -vitamin D , Pseudoephedrine-Guaifenesin , budesonide -formoterol , famotidine , meloxicam , gabapentin , albuterol , fluticasone , levocetirizine, atorvastatin , metoprolol  succinate, Hematinic/Folic Acid , pantoprazole , montelukast , Blood Glucose Monitoring Suppl, BLOOD GLUCOSE TEST STRIPS, predniSONE , and HYDROcodone -acetaminophen . We will continue to administer denosumab .  No orders of the defined types were placed in  this encounter.

## 2023-11-10 ENCOUNTER — Other Ambulatory Visit: Payer: Self-pay | Admitting: Family Medicine

## 2023-11-10 ENCOUNTER — Ambulatory Visit: Admitting: Family Medicine

## 2023-11-10 ENCOUNTER — Ambulatory Visit: Admitting: Student

## 2023-11-10 ENCOUNTER — Encounter: Payer: Self-pay | Admitting: Family Medicine

## 2023-11-10 ENCOUNTER — Ambulatory Visit: Payer: Self-pay | Admitting: Student

## 2023-11-10 ENCOUNTER — Encounter: Payer: Self-pay | Admitting: Student

## 2023-11-10 VITALS — BP 138/100 | HR 82 | Temp 98.0°F | Resp 12 | Ht 61.0 in | Wt 110.2 lb

## 2023-11-10 DIAGNOSIS — M25512 Pain in left shoulder: Secondary | ICD-10-CM

## 2023-11-10 DIAGNOSIS — R053 Chronic cough: Secondary | ICD-10-CM | POA: Diagnosis not present

## 2023-11-10 DIAGNOSIS — M25511 Pain in right shoulder: Secondary | ICD-10-CM | POA: Diagnosis not present

## 2023-11-10 DIAGNOSIS — R030 Elevated blood-pressure reading, without diagnosis of hypertension: Secondary | ICD-10-CM | POA: Insufficient documentation

## 2023-11-10 DIAGNOSIS — I10 Essential (primary) hypertension: Secondary | ICD-10-CM | POA: Diagnosis not present

## 2023-11-10 LAB — C-REACTIVE PROTEIN: CRP: <1 mg/dL (ref 0.5–20.0)

## 2023-11-10 LAB — SEDIMENTATION RATE: Sed Rate: 26 mm/h (ref 0–30)

## 2023-11-10 MED ORDER — BENZONATATE 100 MG PO CAPS
100.0000 mg | ORAL_CAPSULE | Freq: Three times a day (TID) | ORAL | 0 refills | Status: DC | PRN
Start: 2023-11-10 — End: 2024-03-01

## 2023-11-10 MED ORDER — PREDNISONE 10 MG PO TABS
ORAL_TABLET | ORAL | 0 refills | Status: DC
Start: 2023-11-10 — End: 2024-01-03

## 2023-11-10 NOTE — Assessment & Plan Note (Addendum)
 The patient reported a positive response to the prednisone  prescribed by urgent care. Given the symptom pattern and clinical history, polymyalgia rheumatica (PMR) remains a consideration. A new 8-week prednisone  taper has been initiated, and ESR and CRP will be checked today. The patient was advised to follow up as needed and at the completion of the taper to assess response and determine whether further evaluation or management is necessary based on the persistence or resolution of symptoms.  Initiated a prednisone  taper beginning with 20 mg daily for 4 weeks, followed by 10 mg daily for an additional 4 weeks. The patient was instructed to discontinue her usual 2.5 mg prednisone  dose during this  8 week regimen. She voiced understanding of the instructions.

## 2023-11-10 NOTE — Assessment & Plan Note (Addendum)
 Long history of chronic cough.  Not wheezing today she is using her Symbicort , Flonase , montelukast , and Mucinex  regularly.  Patient reports using albuterol  more frequently/daily-patient to return to pulmonary to reevaluate current treatment.

## 2023-11-10 NOTE — Assessment & Plan Note (Signed)
 BP: (!) 138/100    Blood Pressure elevated today.  Encourage patient to monitor her blood pressure at home.  Potentially elevated due to pain.  Will recheck at follow-up appointment.  If blood pressure continues to be elevated will consider changing antihypertensive meds.

## 2023-12-22 ENCOUNTER — Emergency Department (HOSPITAL_COMMUNITY)
Admission: EM | Admit: 2023-12-22 | Discharge: 2023-12-23 | Disposition: A | Discharge status: Home / Self Care | Attending: Emergency Medicine | Admitting: Emergency Medicine

## 2023-12-22 DIAGNOSIS — M25561 Pain in right knee: Secondary | ICD-10-CM | POA: Insufficient documentation

## 2023-12-22 DIAGNOSIS — W19XXXA Unspecified fall, initial encounter: Secondary | ICD-10-CM

## 2023-12-22 DIAGNOSIS — S022XXA Fracture of nasal bones, initial encounter for closed fracture: Secondary | ICD-10-CM | POA: Diagnosis not present

## 2023-12-22 DIAGNOSIS — R519 Headache, unspecified: Secondary | ICD-10-CM | POA: Diagnosis present

## 2023-12-22 DIAGNOSIS — Y92480 Sidewalk as the place of occurrence of the external cause: Secondary | ICD-10-CM | POA: Diagnosis not present

## 2023-12-22 DIAGNOSIS — W101XXA Fall (on)(from) sidewalk curb, initial encounter: Secondary | ICD-10-CM | POA: Insufficient documentation

## 2023-12-23 ENCOUNTER — Encounter (HOSPITAL_COMMUNITY): Payer: Self-pay | Admitting: *Deleted

## 2023-12-23 ENCOUNTER — Emergency Department (HOSPITAL_COMMUNITY)

## 2023-12-23 ENCOUNTER — Other Ambulatory Visit: Payer: Self-pay

## 2023-12-23 LAB — CBC WITH DIFFERENTIAL/PLATELET
Abs Immature Granulocytes: 0.04 K/uL (ref 0.00–0.07)
Basophils Absolute: 0 K/uL (ref 0.0–0.1)
Basophils Relative: 1 %
Eosinophils Absolute: 0 K/uL (ref 0.0–0.5)
Eosinophils Relative: 0 %
HCT: 38.5 % (ref 36.0–46.0)
Hemoglobin: 12 g/dL (ref 12.0–15.0)
Immature Granulocytes: 1 %
Lymphocytes Relative: 15 %
Lymphs Abs: 1.1 K/uL (ref 0.7–4.0)
MCH: 23.3 pg — ABNORMAL LOW (ref 26.0–34.0)
MCHC: 31.2 g/dL (ref 30.0–36.0)
MCV: 74.8 fL — ABNORMAL LOW (ref 80.0–100.0)
Monocytes Absolute: 0.5 K/uL (ref 0.1–1.0)
Monocytes Relative: 7 %
Neutro Abs: 5.8 K/uL (ref 1.7–7.7)
Neutrophils Relative %: 76 %
Platelets: 185 K/uL (ref 150–400)
RBC: 5.15 MIL/uL — ABNORMAL HIGH (ref 3.87–5.11)
RDW: 15.9 % — ABNORMAL HIGH (ref 11.5–15.5)
WBC: 7.5 K/uL (ref 4.0–10.5)
nRBC: 0 % (ref 0.0–0.2)

## 2023-12-23 LAB — BASIC METABOLIC PANEL WITH GFR
Anion gap: 9 (ref 5–15)
BUN: 17 mg/dL (ref 8–23)
CO2: 25 mmol/L (ref 22–32)
Calcium: 9.6 mg/dL (ref 8.9–10.3)
Chloride: 106 mmol/L (ref 98–111)
Creatinine, Ser: 0.77 mg/dL (ref 0.44–1.00)
GFR, Estimated: >60 mL/min (ref >60)
Glucose, Bld: 103 mg/dL — ABNORMAL HIGH (ref 70–99)
Potassium: 4.1 mmol/L (ref 3.5–5.1)
Sodium: 140 mmol/L (ref 135–145)

## 2023-12-23 NOTE — ED Provider Notes (Signed)
 [place] EMERGENCY DEPARTMENT AT [facility] Provider Note   CSN: [id number] Arrival date & time: 12/22/23  2334     History Chief Complaint  Patient presents with   Fall    HPI Jamie Cox is a 63 y.o. female presenting for chief complaint of ground-level fall.  She states that she was stepping up onto a curb and slipped on a concrete riser and fell face forward.  Glasses scratched up, pain in her nose otherwise asymptomatic.Jamie Cox   Patient's recorded medical, surgical, social, medication list and allergies were reviewed in the Snapshot window as part of the initial history.   Review of Systems   Review of Systems  Constitutional:  Negative for chills and fever.  HENT:  Negative for ear pain and sore throat.   Eyes:  Negative for pain and visual disturbance.  Respiratory:  Negative for cough and shortness of breath.   Cardiovascular:  Negative for chest pain and palpitations.  Gastrointestinal:  Negative for abdominal pain and vomiting.  Genitourinary:  Negative for dysuria and hematuria.  Musculoskeletal:  Negative for arthralgias and back pain.  Skin:  Negative for color change and rash.  Neurological:  Negative for seizures and syncope.  All other systems reviewed and are negative.   Physical Exam Updated Vital Signs BP (!) 158/76   Pulse 88   Temp 98.2 F (36.8 C)   Resp 18   Ht 5' (1.524 m)   Wt 50 kg   SpO2 98%   BMI 21.53 kg/m  Physical Exam Vitals and nursing note reviewed.  Constitutional:      General: She is not in acute distress.    Appearance: She is well-developed.  HENT:     Head: Normocephalic and atraumatic.  Eyes:     Conjunctiva/sclera: Conjunctivae normal.  Cardiovascular:     Rate and Rhythm: Normal rate and regular rhythm.     Heart sounds: No murmur heard. Pulmonary:     Effort: Pulmonary effort is normal. No respiratory distress.     Breath sounds: Normal breath sounds.  Abdominal:     General: There is no  distension.     Palpations: Abdomen is soft.     Tenderness: There is no abdominal tenderness. There is no right CVA tenderness or left CVA tenderness.  Musculoskeletal:        General: No swelling or tenderness. Normal range of motion.     Cervical back: Neck supple.  Skin:    General: Skin is warm and dry.  Neurological:     General: No focal deficit present.     Mental Status: She is alert and oriented to person, place, and time. Mental status is at baseline.     Cranial Nerves: No cranial nerve deficit.      ED Course/ Medical Decision Making/ A&P    Procedures Procedures   Medications Ordered in ED Medications - No data to display  Medical Decision Making:    Jamie Cox is a 63 y.o. female who presented to the ED today with a moderate mechanisma trauma, detailed above.    Patient placed on continuous vitals and telemetry monitoring while in ED which was reviewed periodically.   Given this mechanism of trauma, a full physical exam was performed. Notably, patient was HDS in NAD.   Reviewed and confirmed nursing documentation for past medical history, family history, social history.    Initial Assessment/Plan:   This is a patient presenting with a moderate mechanism trauma.  As such, I have considered intracranial injuries including intracranial hemorrhage, intrathoracic injuries including blunt myocardial or blunt lung injury, blunt abdominal injuries including aortic dissection, bladder injury, spleen injury, liver injury and I have considered orthopedic injuries including extremity or spinal injury.  With the patient's presentation of moderate mechanism trauma but an otherwise reassuring exam, patient warrants targeted evaluation for potential traumatic injuries. Will proceed with targeted evaluation for potential injuries. Will proceed with CT head, CT max face, CT C-spine. Objective evaluation resulted with nasal fracture, patient informed that she did to follow-up  with ear nose and throat for further care and management..   Disposition:  I have considered need for hospitalization, however, considering all of the above, I believe this patient is stable for discharge at this time.  Patient/family educated about specific return precautions for given chief complaint and symptoms.  Patient/family educated about follow-up with PCP      Patient/family expressed understanding of return precautions and need for follow-up. Patient spoken to regarding all imaging and laboratory results and appropriate follow up for these results. All education provided in verbal form with additional information in written form. Time was allowed for answering of patient questions. Patient discharged.    Emergency Department Medication Summary:   Medications - No data to display           Clinical Impression:  1. Fall, initial encounter      Discharge   Final Clinical Impression(s) / ED Diagnoses Final diagnoses:  Fall, initial encounter    Rx / DC Orders ED Discharge Orders     None         Jerral Meth, MD 12/23/23 2339

## 2023-12-23 NOTE — ED Triage Notes (Signed)
 Her fall was around 1900 tonight she has had a vicodin

## 2023-12-23 NOTE — ED Triage Notes (Signed)
 The pt trip and fell on concrete face forward her lt eye is swollen and she has pain in her rt cheek  and rt kne pain  she wore glasses ans the lt elns is scratched up

## 2023-12-28 ENCOUNTER — Other Ambulatory Visit: Payer: Self-pay | Admitting: Family Medicine

## 2024-01-03 ENCOUNTER — Ambulatory Visit: Admitting: Student

## 2024-01-03 ENCOUNTER — Encounter: Payer: Self-pay | Admitting: Student

## 2024-01-03 VITALS — BP 138/88 | HR 91 | Temp 98.0°F | Resp 12 | Ht 60.0 in | Wt 108.4 lb

## 2024-01-03 DIAGNOSIS — M25511 Pain in right shoulder: Secondary | ICD-10-CM

## 2024-01-03 DIAGNOSIS — M25512 Pain in left shoulder: Secondary | ICD-10-CM

## 2024-01-03 DIAGNOSIS — I10 Essential (primary) hypertension: Secondary | ICD-10-CM

## 2024-01-03 DIAGNOSIS — W19XXXD Unspecified fall, subsequent encounter: Secondary | ICD-10-CM | POA: Diagnosis not present

## 2024-01-03 DIAGNOSIS — Z79899 Other long term (current) drug therapy: Secondary | ICD-10-CM

## 2024-01-03 DIAGNOSIS — R29898 Other symptoms and signs involving the musculoskeletal system: Secondary | ICD-10-CM

## 2024-01-03 DIAGNOSIS — G8929 Other chronic pain: Secondary | ICD-10-CM

## 2024-01-03 DIAGNOSIS — E119 Type 2 diabetes mellitus without complications: Secondary | ICD-10-CM | POA: Diagnosis not present

## 2024-01-03 DIAGNOSIS — W19XXXA Unspecified fall, initial encounter: Secondary | ICD-10-CM | POA: Insufficient documentation

## 2024-01-03 DIAGNOSIS — Z09 Encounter for follow-up examination after completed treatment for conditions other than malignant neoplasm: Secondary | ICD-10-CM

## 2024-01-03 NOTE — Assessment & Plan Note (Signed)
 Well controlled, no changes to meds. Encouraged heart healthy diet such as the DASH diet and exercise as tolerated.  Encouraged patient to monitor BP at home and return to clinic for BP >140/90.

## 2024-01-03 NOTE — Assessment & Plan Note (Signed)
 Ophthalmology referral sent.  Encourage patient to have DM eye exam.

## 2024-01-03 NOTE — Progress Notes (Signed)
 Subjective:     Patient ID: Jamie Cox, female    DOB: 02/12/1961, 63 y.o.   MRN: [id number]  Chief Complaint  Patient presents with   follow up fall    Sore on right side of face and right knee     HPI  Jamie Cox is a 63 year old female who presents to office visit following emergency room encounter for a ground level fall.  She has Hx of bilateral leg weakness, right leg worse than left leg and has been evaluated by Ortho and Neurology for this. Denies current numbness and tingling in the legs.  She reports that Friday, December 22, 2023 she was on break at work, and while walking down the sidewalk, there was uneven ground and she slipped and fell on concrete sidewalk during her shift.    CT spine and head 12/23/2023-IMPRESSION: 1. No acute intracranial abnormality. 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 2. No traumatic injury to the cervical spine.  CT Maxillofacial 12/23/2023 IMPRESSION: 1. Minimally displaced fracture of the left nasal bone. 2. Mild paranasal sinus mucosal thickening.   HTN Metoprolol  succinate 25 mg daily.  Compliant with medication.   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  Reports compliance with this medication   GERD famotidine  40 mg daily, Pantoprazole  (Protonix ) 20 mg daily.  Reports compliance.   Hx of microcytic anemia, likely due to iron deficiency secondary to gastrointestinal blood loss- Followed by Hem/Onc and GI Ferrous Fumarate -Folic Acid  Colonoscopy 09/2023 r/t blood in stool- study was negative for blood loss/ IDA causes  Allergy Xyzal  5 mg tab, Flonase  nasal spray, Singulair  10 mg tab daily  Reports taking medication as prescribed.  Patient  denies fever, chills, SOB, CP, palpitations, dyspnea, edema, HA, vision changes, N/V/D, abdominal pain, urinary symptoms, rash, weight changes, and recent illness.  History of Present Illness              Health Maintenance Due  Topic Date Due   OPHTHALMOLOGY EXAM  Never done    Past Medical History:  Diagnosis Date   Allergy    Asthma    Atypical chest pain    Chronic sinusitis    Followed by Dr. 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   History of chicken pox    Hypercalcemia 07/19/2015   Hypertension    Osteoporosis, unspecified    steroid induced   Personal history of other diseases of circulatory system    Rheumatoid arthritis(714.0)    Unspecified deficiency anemia    microcytic   Urticaria     Past Surgical History:  Procedure Laterality Date   NASAL SINUS SURGERY  06/2012   NASAL SINUS SURGERY     2010   OPEN REDUCTION INTERNAL FIXATION (ORIF) METACARPAL Right 07/13/2022   Procedure: OPEN REDUCTION INTERNAL FIXATION METACARPAL RIGHT SMALL FINGER;  Surgeon: Shari Easter, MD;  Location: [facility] OR;  Service: Orthopedics;  Laterality: Right;  regional with iv sedation    Family History  Problem Relation Age of Onset   Scleroderma Mother    Heart disease Mother    Kidney disease Mother    Other Mother    Prostate cancer Father    Diabetes  Father    Heart disease Father        CHF   Arthritis Father        s/p hip replacement   Cancer Father        lung cancer with mets, smoker   Other Sister        Cardiomegaly   Arthritis Sister    Heart disease Sister        rare   Sleep apnea Brother    Heart disease Brother    Lung cancer Paternal Uncle    Liver cancer Maternal Grandmother    Hypertension Maternal Grandmother    Heart attack Maternal Grandmother    Coronary artery disease Paternal Grandmother    Hypertension Paternal Grandmother    Liver cancer Paternal  Grandmother    Alzheimer's disease Paternal Grandfather    Arthritis Daughter    Hyperlipidemia Son    Lupus Cousin    Arthritis Cousin    Colon cancer Neg Hx    Asthma Neg Hx    Allergic rhinitis Neg Hx    Eczema Neg Hx    Immunodeficiency Neg Hx    Angioedema Neg Hx    Esophageal cancer Neg Hx    Rectal cancer Neg Hx    Stomach cancer Neg Hx    Colon polyps Neg Hx     Social History   Socioeconomic History   Marital status: Married    Spouse name: Not on file   Number of children: 2   Years of education: Not on file   Highest education level: Not on file  Occupational History   Occupation: Data processing manager  Tobacco Use   Smoking status: Never   Smokeless tobacco: Never  Vaping Use   Vaping status: Never Used  Substance and Sexual Activity   Alcohol use: No    Alcohol/week: 0.0 standard drinks of alcohol   Drug use: No   Sexual activity: Not Currently  Other Topics Concern   Not on file  Social History Narrative   Not on file   Social Drivers of Health   Financial Resource Strain: Not on file  Food Insecurity: Not on file  Transportation Needs: No Transportation Needs (03/10/2023)   PRAPARE - Administrator, Civil Service (Medical): No    Lack of Transportation (Non-Medical): No  Physical Activity: Not on file  Stress: Not on file  Social Connections: Not on file  Intimate Partner Violence: Not on file    Outpatient Medications Prior to Visit  Medication Sig Dispense Refill   albuterol  (VENTOLIN  HFA) 108 (90 Base) MCG/ACT inhaler INHALE ONE TO TWO PUFFS INTO THE LUNGS EVERY 6 HOURS AS NEEDED FOR WHEEZING OR SHORTNESS OF BREATH 18 each 5   atorvastatin  (LIPITOR) 10 MG tablet Take 1 tablet by mouth once daily 90 tablet 0   benzonatate  (TESSALON ) 100 MG capsule Take 1 capsule (100 mg total) by mouth 3 (three) times daily as needed for cough. 30 capsule 0   Blood Glucose Monitoring Suppl DEVI 1 each by Does not apply route 3 (three) times daily.  May substitute to any manufacturer covered by patient's insurance. 1 each 0   budesonide -formoterol  (SYMBICORT ) 160-4.5 MCG/ACT inhaler Inhale 2 puffs into the lungs 2 (two) times daily. 10.2 g 12   calcium -vitamin D  (OSCAL WITH D) 500-5 MG-MCG tablet Take 1 tablet by mouth daily with breakfast.     famotidine  (PEPCID ) 40 MG tablet Take 1 tablet (40 mg total) by mouth daily. 90 tablet  1   Ferrous Fumarate -Folic Acid  (HEMATINIC/FOLIC ACID ) 324-1 MG TABS Take 1 tablet by mouth daily. 90 tablet 1   fluticasone  (FLONASE ) 50 MCG/ACT nasal spray Place 2 sprays into both nostrils daily. 16 g 5   gabapentin  (NEURONTIN ) 300 MG capsule Take 1 capsule (300 mg total) by mouth 2 (two) times daily as needed. 180 capsule 1   Glucose Blood (BLOOD GLUCOSE TEST STRIPS) STRP 1 each by Does not apply route 3 (three) times daily. May substitute to any manufacturer covered by patient's insurance. 100 strip 3   HYDROcodone -acetaminophen  (NORCO/VICODIN) 5-325 MG tablet Take 1 tablet by mouth every 6 (six) hours as needed for moderate pain (pain score 4-6) or severe pain (pain score 7-10). 40 tablet 0   levocetirizine (XYZAL ) 5 MG tablet Take 1 tablet (5 mg total) by mouth every evening. 30 tablet 4   meloxicam  (MOBIC ) 7.5 MG tablet TAKE 1 TO 2 TABLETS BY MOUTH ONCE DAILY AS NEEDED FOR PAIN 60 tablet 03   metoprolol  succinate (TOPROL -XL) 25 MG 24 hr tablet Take 1 tablet (25 mg total) by mouth daily. 90 tablet 1   montelukast  (SINGULAIR ) 10 MG tablet TAKE 1 TABLET BY MOUTH AT BEDTIME 90 tablet 0   pantoprazole  (PROTONIX ) 20 MG tablet Take 1 tablet (20 mg total) by mouth daily before breakfast. 90 tablet 3   Pseudoephedrine-Guaifenesin  (MUCINEX  D MAX STRENGTH) [id number] MG TB12 Take 1 tablet by mouth 2 (two) times daily.     predniSONE  (DELTASONE ) 10 MG tablet Take 2 tablets (20 mg total) by mouth daily with breakfast for 28 days, THEN 1 tablet (10 mg total) daily with breakfast for 28 days. 84 tablet 0   predniSONE  (DELTASONE )  2.5 MG tablet Take 1 tablet by mouth once daily with breakfast (Patient not taking: Reported on 01/03/2024) 90 tablet 0   Facility-Administered Medications Prior to Visit  Medication Dose Route Frequency Provider Last Rate Last Admin   [START ON 04/30/2024] denosumab  (PROLIA ) injection 60 mg  60 mg Subcutaneous Q6 months Domenica Harlene LABOR, MD        Allergies  Allergen Reactions   Augmentin  [Amoxicillin -Pot Clavulanate] Hives, Itching and Swelling   Avelox [Moxifloxacin Hcl In Nacl] Diarrhea, Nausea And Vomiting and Other (See Comments)    Dizziness, Cold chills    Bactrim  [Sulfamethoxazole -Trimethoprim ] Hives   Doxycycline      Abdominal discomfort, anorexia   Sulfamethoxazole -Trimethoprim  Hives   Amoxicillin  Other (See Comments)    ROS See HPI    Objective:    Physical Exam  General: No acute distress. Awake and conversant.  Eyes: Normal conjunctiva, Round symmetric pupils. PERRLA.  ENT: No nasal discharge.  Respiratory: CTAB. Respirations are non-labored. No wheezing.  Skin: Warm. No rashes or ulcers.  Psych: Alert and oriented. Cooperative, Appropriate mood and affect, Normal judgment.  CV: RRR. No murmur. No lower extremity edema.  MSK: No clubbing or cyanosis. Gait weakness, b/l LE strength 3/5 Neuro:  CN II-XII grossly normal.    BP 138/88 (BP Location: Right Arm, Patient Position: Sitting, Cuff Size: Normal)   Pulse 91   Temp 98 F (36.7 C) (Oral)   Resp 12   Ht 5' (1.524 m)   Wt 108 lb 6.4 oz (49.2 kg)   SpO2 97%   BMI 21.17 kg/m  Wt Readings from Last 3 Encounters:  01/03/24 108 lb 6.4 oz (49.2 kg)  12/23/23 110 lb 3.7 oz (50 kg)  11/10/23 110 lb 3.2 oz (50 kg)       Assessment &  Plan:   Problem List Items Addressed This Visit     Bilateral shoulder pain   Pt reports b/l shoulder pain improved starting prednisone  regimine.  Pt has been on 20 mg prednisone  for 4 weeks. Stop prednisone  20 mg daily. Start prednisone  10 mg daily for 4 weeks. The patient  was instructed to d/c 2.5 mg prednisone  dose during this  8 week regimen.  After 4 weeks of  prednisone  10 mg daily, return to 2.5 mg prednisone  dose. Pt voiced understanding of the instructions.       Controlled type 2 diabetes mellitus without complication, without long-term current use of insulin  [facility])   Ophthalmology referral sent.  Encourage patient to have DM eye exam.      Relevant Orders   Ambulatory referral to Ophthalmology   Fall - Primary   Patient had ground-level fall 12/23/2023. CT spine, CT head negative.  CT maxillofacial shows minimally displaced fracture of left nasal bone.  Referral sent to ENT for further evaluation.  PT referral sent for further evaluation.        Relevant Orders   Ambulatory referral to ENT   Ambulatory referral to Physical Therapy   High risk medication use   UDS and contract updated today.      Relevant Orders   Drug Monitoring Panel [id number] , Urine   Hypertension   Well controlled, no changes to meds. Encouraged heart healthy diet such as the DASH diet and exercise as tolerated.  Encouraged patient to monitor BP at home and return to clinic for BP >140/90.      Other Visit Diagnoses       Hospital discharge follow-up         Leg weakness, bilateral       Relevant Orders   Ambulatory referral to Physical Therapy      FU 6 months. Portions of this note were dictated using DRAGON voice recognition software. Please disregard any errors in transcription.    I am having Shimika L. Berringer maintain her calcium -vitamin D , Pseudoephedrine-Guaifenesin , budesonide -formoterol , meloxicam , gabapentin , albuterol , fluticasone , levocetirizine, metoprolol  succinate, Hematinic/Folic Acid , pantoprazole , montelukast , Blood Glucose Monitoring Suppl, BLOOD GLUCOSE TEST STRIPS, predniSONE , HYDROcodone -acetaminophen , famotidine , benzonatate , and atorvastatin . We will continue to administer denosumab .  No orders of the defined types were placed in this  encounter.

## 2024-01-03 NOTE — Assessment & Plan Note (Signed)
 UDS and contract updated today.

## 2024-01-03 NOTE — Assessment & Plan Note (Addendum)
 Pt reports b/l shoulder pain improved starting prednisone  regimine.  Pt has been on 20 mg prednisone  for 4 weeks. Stop prednisone  20 mg daily. Start prednisone  10 mg daily for 4 weeks. The patient was instructed to d/c 2.5 mg prednisone  dose during this  8 week regimen.  After 4 weeks of  prednisone  10 mg daily, return to 2.5 mg prednisone  dose. Pt voiced understanding of the instructions.

## 2024-01-03 NOTE — Assessment & Plan Note (Addendum)
 Patient had ground-level fall 12/23/2023. CT spine, CT head negative.  CT maxillofacial shows minimally displaced fracture of left nasal bone.  Referral sent to ENT for further evaluation.  PT referral sent for further evaluation.

## 2024-01-06 ENCOUNTER — Ambulatory Visit: Payer: Self-pay | Admitting: Student

## 2024-01-06 ENCOUNTER — Other Ambulatory Visit: Payer: Self-pay | Admitting: Internal Medicine

## 2024-01-06 LAB — DRUG MONITORING PANEL 376104, URINE
Amphetamines: NEGATIVE ng/mL (ref <500)
Barbiturates: NEGATIVE ng/mL (ref <300)
Benzodiazepines: NEGATIVE ng/mL (ref <100)
Cocaine Metabolite: NEGATIVE ng/mL (ref <150)
Desmethyltramadol: NEGATIVE ng/mL (ref <100)
Opiates: NEGATIVE ng/mL (ref <100)
Oxycodone: NEGATIVE ng/mL (ref <100)
Tramadol: NEGATIVE ng/mL (ref <100)

## 2024-01-06 LAB — DM TEMPLATE

## 2024-01-08 ENCOUNTER — Inpatient Hospital Stay (HOSPITAL_BASED_OUTPATIENT_CLINIC_OR_DEPARTMENT_OTHER): Admitting: Internal Medicine

## 2024-01-08 ENCOUNTER — Inpatient Hospital Stay: Attending: Internal Medicine

## 2024-01-08 ENCOUNTER — Encounter: Payer: Self-pay | Admitting: *Deleted

## 2024-01-08 VITALS — BP 136/68 | HR 86 | Temp 98.2°F | Resp 16 | Ht 60.0 in | Wt 112.0 lb

## 2024-01-08 DIAGNOSIS — E538 Deficiency of other specified B group vitamins: Secondary | ICD-10-CM | POA: Insufficient documentation

## 2024-01-08 DIAGNOSIS — Z7952 Long term (current) use of systemic steroids: Secondary | ICD-10-CM | POA: Diagnosis not present

## 2024-01-08 DIAGNOSIS — M069 Rheumatoid arthritis, unspecified: Secondary | ICD-10-CM | POA: Diagnosis not present

## 2024-01-08 DIAGNOSIS — K909 Intestinal malabsorption, unspecified: Secondary | ICD-10-CM | POA: Insufficient documentation

## 2024-01-08 DIAGNOSIS — K219 Gastro-esophageal reflux disease without esophagitis: Secondary | ICD-10-CM | POA: Insufficient documentation

## 2024-01-08 DIAGNOSIS — S022XXA Fracture of nasal bones, initial encounter for closed fracture: Secondary | ICD-10-CM | POA: Diagnosis not present

## 2024-01-08 DIAGNOSIS — D539 Nutritional anemia, unspecified: Secondary | ICD-10-CM

## 2024-01-08 DIAGNOSIS — Y9301 Activity, walking, marching and hiking: Secondary | ICD-10-CM | POA: Diagnosis not present

## 2024-01-08 DIAGNOSIS — M810 Age-related osteoporosis without current pathological fracture: Secondary | ICD-10-CM | POA: Insufficient documentation

## 2024-01-08 DIAGNOSIS — J45909 Unspecified asthma, uncomplicated: Secondary | ICD-10-CM | POA: Diagnosis not present

## 2024-01-08 DIAGNOSIS — D509 Iron deficiency anemia, unspecified: Secondary | ICD-10-CM | POA: Insufficient documentation

## 2024-01-08 DIAGNOSIS — Z7951 Long term (current) use of inhaled steroids: Secondary | ICD-10-CM | POA: Insufficient documentation

## 2024-01-08 DIAGNOSIS — Z79899 Other long term (current) drug therapy: Secondary | ICD-10-CM | POA: Diagnosis not present

## 2024-01-08 DIAGNOSIS — W19XXXA Unspecified fall, initial encounter: Secondary | ICD-10-CM | POA: Insufficient documentation

## 2024-01-08 DIAGNOSIS — D508 Other iron deficiency anemias: Secondary | ICD-10-CM

## 2024-01-08 DIAGNOSIS — I10 Essential (primary) hypertension: Secondary | ICD-10-CM | POA: Insufficient documentation

## 2024-01-08 LAB — CMP (CANCER CENTER ONLY)
ALT: 17 U/L (ref 0–44)
AST: 16 U/L (ref 15–41)
Albumin: 4.1 g/dL (ref 3.5–5.0)
Alkaline Phosphatase: 77 U/L (ref 38–126)
Anion gap: 8 (ref 5–15)
BUN: 23 mg/dL (ref 8–23)
CO2: 27 mmol/L (ref 22–32)
Calcium: 9.8 mg/dL (ref 8.9–10.3)
Chloride: 105 mmol/L (ref 98–111)
Creatinine: 0.82 mg/dL (ref 0.44–1.00)
GFR, Estimated: >60 mL/min (ref >60)
Glucose, Bld: 96 mg/dL (ref 70–99)
Potassium: 3.8 mmol/L (ref 3.5–5.1)
Sodium: 140 mmol/L (ref 135–145)
Total Bilirubin: 0.2 mg/dL (ref 0.0–1.2)
Total Protein: 7.7 g/dL (ref 6.5–8.1)

## 2024-01-08 LAB — CBC WITH DIFFERENTIAL (CANCER CENTER ONLY)
Abs Immature Granulocytes: 0.03 K/uL (ref 0.00–0.07)
Basophils Absolute: 0 K/uL (ref 0.0–0.1)
Basophils Relative: 0 %
Eosinophils Absolute: 0 K/uL (ref 0.0–0.5)
Eosinophils Relative: 1 %
HCT: 37.8 % (ref 36.0–46.0)
Hemoglobin: 12.1 g/dL (ref 12.0–15.0)
Immature Granulocytes: 0 %
Lymphocytes Relative: 25 %
Lymphs Abs: 1.8 K/uL (ref 0.7–4.0)
MCH: 23.3 pg — ABNORMAL LOW (ref 26.0–34.0)
MCHC: 32 g/dL (ref 30.0–36.0)
MCV: 72.8 fL — ABNORMAL LOW (ref 80.0–100.0)
Monocytes Absolute: 0.5 K/uL (ref 0.1–1.0)
Monocytes Relative: 7 %
Neutro Abs: 5 K/uL (ref 1.7–7.7)
Neutrophils Relative %: 67 %
Platelet Count: 198 K/uL (ref 150–400)
RBC: 5.19 MIL/uL — ABNORMAL HIGH (ref 3.87–5.11)
RDW: 16.4 % — ABNORMAL HIGH (ref 11.5–15.5)
WBC Count: 7.4 K/uL (ref 4.0–10.5)
nRBC: 0 % (ref 0.0–0.2)

## 2024-01-08 LAB — FERRITIN: Ferritin: 1938 ng/mL — ABNORMAL HIGH (ref 11–307)

## 2024-01-08 LAB — FOLATE: Folate: >40 ng/mL (ref >5.9)

## 2024-01-08 LAB — VITAMIN B12: Vitamin B-12: 429 pg/mL (ref 180–914)

## 2024-01-08 LAB — IRON AND IRON BINDING CAPACITY (CC-WL,HP ONLY)
Iron: 78 ug/dL (ref 28–170)
Saturation Ratios: 29 % (ref 10.4–31.8)
TIBC: 266 ug/dL (ref 250–450)
UIBC: 188 ug/dL (ref 148–442)

## 2024-01-08 NOTE — Progress Notes (Signed)
 [facility] Telephone:(336) [phone]   Fax:([phone]  OFFICE PROGRESS NOTE  Domenica Harlene LABOR, MD [address] [place] [address]  DIAGNOSIS: Microcytic anemia likely from iron deficiency and probably from a gastrointestinal blood loss.   PRIOR THERAPY: None  CURRENT THERAPY: Oral iron tablet with ferrous fumarate  in addition to folic acid  and vitamin B12 injection  INTERVAL HISTORY: Jamie Cox 63 y.o. female returns to the clinic today for follow-up visit. Discussed the use of AI scribe software for clinical note transcription with the patient, who gave verbal consent to proceed.  History of Present Illness Jamie Cox is a 63 year old female with microcytic anemia who presents for evaluation and repeat blood work.  She is currently undergoing treatment for microcytic anemia, likely due to iron deficiency from gastrointestinal blood loss. Her treatment regimen includes oral iron tablets with ferrous fumarate , folic acid , and vitamin B12 injections. Despite a normal hemoglobin level of 12.1 g/dL, her mean corpuscular volume (MCV) remains low at 72.8 fL.  Recently, she experienced a fall while walking on a cracked sidewalk at work, resulting in a facial injury. An emergency room visit revealed a displaced fracture of her nose. The area is still slightly sore but is improving. Additionally, her glasses were broken during the fall and require replacement.  She has been on an increased dose of prednisone  recently but has now returned to her regular dose. The iron tablets are not causing any discomfort.      MEDICAL HISTORY: Past Medical History:  Diagnosis Date   Allergy    Asthma    Atypical chest pain    Chronic sinusitis    Followed by Dr. 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  MEDICATIONS:  Current Outpatient Medications  Medication Sig Dispense Refill   albuterol  (VENTOLIN  HFA) 108 (90 Base) MCG/ACT inhaler INHALE ONE TO TWO PUFFS INTO THE LUNGS EVERY 6 HOURS AS NEEDED FOR WHEEZING OR SHORTNESS OF BREATH 18 each 5   atorvastatin  (LIPITOR) 10 MG tablet Take 1 tablet by mouth once daily 90 tablet 0   benzonatate  (TESSALON ) 100 MG capsule Take 1 capsule (100 mg total) by mouth 3 (three) times daily as needed for cough. 30 capsule 0   Blood Glucose Monitoring Suppl DEVI 1 each by Does not apply route 3 (three) times daily. May substitute to any manufacturer covered by patient's insurance. 1 each 0   budesonide -formoterol  (SYMBICORT ) 160-4.5 MCG/ACT inhaler Inhale 2 puffs into the lungs 2 (two) times daily. 10.2 g 12   calcium -vitamin D  (OSCAL WITH D) 500-5 MG-MCG tablet Take 1 tablet by mouth daily with breakfast.     famotidine  (PEPCID ) 40 MG tablet Take 1 tablet (40 mg total) by mouth daily. 90 tablet 1   Ferrous Fumarate -Folic Acid  (HEMATINIC/FOLIC ACID ) 324-1 MG TABS Take 1 tablet by mouth daily. 90 tablet 1   fluticasone  (FLONASE ) 50 MCG/ACT nasal spray Place 2  sprays into both nostrils daily. 16 g 5   gabapentin  (NEURONTIN ) 300 MG capsule Take 1 capsule (300 mg total) by mouth 2 (two) times daily as needed. 180 capsule 1   Glucose Blood (BLOOD GLUCOSE TEST STRIPS) STRP 1 each by Does not apply route 3 (three) times daily. May substitute to any manufacturer covered by patient's insurance. 100 strip 3   HYDROcodone -acetaminophen  (NORCO/VICODIN) 5-325  MG tablet Take 1 tablet by mouth every 6 (six) hours as needed for moderate pain (pain score 4-6) or severe pain (pain score 7-10). 40 tablet 0   levocetirizine (XYZAL ) 5 MG tablet Take 1 tablet (5 mg total) by mouth every evening. 30 tablet 4   meloxicam  (MOBIC ) 7.5 MG tablet TAKE 1 TO 2 TABLETS BY MOUTH ONCE DAILY AS NEEDED FOR PAIN 60 tablet 03   metoprolol  succinate (TOPROL -XL) 25 MG 24 hr tablet Take 1 tablet (25 mg total) by mouth daily. 90 tablet 1   montelukast  (SINGULAIR ) 10 MG tablet TAKE 1 TABLET BY MOUTH AT BEDTIME 90 tablet 0   pantoprazole  (PROTONIX ) 20 MG tablet Take 1 tablet (20 mg total) by mouth daily before breakfast. 90 tablet 3   predniSONE  (DELTASONE ) 2.5 MG tablet Take 1 tablet by mouth once daily with breakfast (Patient not taking: Reported on 01/03/2024) 90 tablet 0   Pseudoephedrine-Guaifenesin  (MUCINEX  D MAX STRENGTH) [phone] MG TB12 Take 1 tablet by mouth 2 (two) times daily.     Current Facility-Administered Medications  Medication Dose Route Frequency Provider Last Rate Last Admin   [START ON 04/30/2024] denosumab  (PROLIA ) injection 60 mg  60 mg Subcutaneous Q6 months Domenica Harlene LABOR, MD        SURGICAL HISTORY:  Past Surgical History:  Procedure Laterality Date   NASAL SINUS SURGERY  06/2012   NASAL SINUS SURGERY     2010   OPEN REDUCTION INTERNAL FIXATION (ORIF) METACARPAL Right 07/13/2022   Procedure: OPEN REDUCTION INTERNAL FIXATION METACARPAL RIGHT SMALL FINGER;  Surgeon: Shari Easter, MD;  Location: [facility] OR;  Service: Orthopedics;  Laterality: Right;  regional with iv sedation    REVIEW OF SYSTEMS:  A comprehensive review of systems was negative.   PHYSICAL EXAMINATION: General appearance: alert, cooperative, and no distress Head: Normocephalic, without obvious abnormality, atraumatic Neck: no adenopathy, no JVD, supple, symmetrical, trachea midline, and thyroid  not enlarged, symmetric, no tenderness/mass/nodules Lymph nodes: Cervical, supraclavicular,  and axillary nodes normal. Resp: clear to auscultation bilaterally Back: symmetric, no curvature. ROM normal. No CVA tenderness. Cardio: regular rate and rhythm, S1, S2 normal, no murmur, click, rub or gallop GI: soft, non-tender; bowel sounds normal; no masses,  no organomegaly Extremities: extremities normal, atraumatic, no cyanosis or edema  ECOG PERFORMANCE STATUS: 1 - Symptomatic but completely ambulatory  Blood pressure 136/68, pulse 86, temperature 98.2 F (36.8 C), temperature source Temporal, resp. rate 16, height 5' (1.524 m), weight 112 lb (50.8 kg), SpO2 99%.  LABORATORY DATA: Lab Results  Component Value Date   WBC 7.4 01/08/2024   HGB 12.1 01/08/2024   HCT 37.8 01/08/2024   MCV 72.8 (L) 01/08/2024   PLT 198 01/08/2024      Chemistry      Component Value Date/Time   NA 140 12/23/2023 0324   K 4.1 12/23/2023 0324   CL 106 12/23/2023 0324   CO2 25 12/23/2023 0324   BUN 17 12/23/2023 0324   CREATININE 0.77 12/23/2023 0324   CREATININE 0.81 10/11/2023 1004   CREATININE 0.71 03/31/2020 1108  Component Value Date/Time   CALCIUM  9.6 12/23/2023 0324   ALKPHOS 63 10/11/2023 1004   AST 18 10/11/2023 1004   ALT 14 10/11/2023 1004   BILITOT 0.3 10/11/2023 1004       RADIOGRAPHIC STUDIES: CT Maxillofacial Wo Contrast Result Date: 12/23/2023 EXAM: CT OF THE FACE WITHOUT CONTRAST 12/23/2023 03:36:01 AM TECHNIQUE: CT of the face was performed without the administration of intravenous contrast. Multiplanar reformatted images are provided for review. Automated exposure control, iterative reconstruction, and/or weight based adjustment of the mA/kV was utilized to reduce the radiation dose to as low as reasonably achievable. COMPARISON: None available. CLINICAL HISTORY: Facial trauma, blunt. Chief complaints; Fall; CT Maxillofacial Wo Contrast; Facial trauma, blunt; CT HEAD WO CONTRAST ( ); Head trauma, minor (Age >= 65y); CT CERVICAL SPINE WO CONTRAST; Polytrauma, blunt  FINDINGS: FACIAL BONES: Minimally displaced fracture of the left nasal bone. No mandibular dislocation. No suspicious bone lesion. ORBITS: Globes are intact. No acute traumatic injury. No inflammatory change. SINUSES AND MASTOIDS: Maxillary antrostomies and uncinectomies. Mild paranasal sinus mucosal thickening. SOFT TISSUES: Poor dentition. No acute abnormality. IMPRESSION: 1. Minimally displaced fracture of the left nasal bone. 2. Mild paranasal sinus mucosal thickening. Electronically signed by: Franky Stanford MD 12/23/2023 03:55 AM EDT RP Workstation: [id number]   CT HEAD WO CONTRAST ( ) Result Date: 12/23/2023 EXAM: CT HEAD AND CERVICAL SPINE 12/23/2023 03:36:01 AM TECHNIQUE: CT of the head and cervical spine was performed without the administration of intravenous contrast. Multiplanar reformatted images are provided for review. Automated exposure control, iterative reconstruction, and/or weight based adjustment of the mA/kV was utilized to reduce the radiation dose to as low as reasonably achievable. COMPARISON: None available. CLINICAL HISTORY: Head trauma, minor (Age >= 65y). Chief complaints; Fall; CT Maxillofacial Wo Contrast; Facial trauma, blunt; CT HEAD WO CONTRAST ( ); Head trauma, minor (Age >= 65y); CT CERVICAL SPINE WO CONTRAST; Polytrauma, blunt FINDINGS: CT HEAD BRAIN AND VENTRICLES: No acute intracranial hemorrhage. No mass effect or midline shift. No abnormal extra-axial fluid collection. Gray-white differentiation is maintained. No hydrocephalus. 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 Mild small vessel ischemic changes. Intracranial atherosclerosis. ORBITS: No acute abnormality. SINUSES AND MASTOIDS: Mucosal thickening/partial opacification of the bilateral ethmoid, bilateral sphenoid, and right maxillary sinuses. SOFT TISSUES AND SKULL: No acute skull fracture. No acute soft tissue abnormality. CT CERVICAL SPINE BONES AND ALIGNMENT: No acute fracture or traumatic malalignment.  DEGENERATIVE CHANGES: Mild degenerative changes of the mid cervical spine. SOFT TISSUES: No prevertebral soft tissue swelling. IMPRESSION: 1. No acute intracranial abnormality. 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 2. No traumatic injury to the cervical spine. Electronically signed by: Pinkie Pebbles MD 12/23/2023 03:48 AM EDT RP Workstation: [id number]   CT CERVICAL SPINE WO CONTRAST Result Date: 12/23/2023 EXAM: CT HEAD AND CERVICAL SPINE 12/23/2023 03:36:01 AM TECHNIQUE: CT of the head and cervical spine was performed without the administration of intravenous contrast. Multiplanar reformatted images are provided for review. Automated exposure control, iterative reconstruction, and/or weight based adjustment of the mA/kV was utilized to reduce the radiation dose to as low as reasonably achievable. COMPARISON: None available. CLINICAL HISTORY: Head trauma, minor (Age >= 65y). Chief complaints; Fall; CT Maxillofacial Wo Contrast; Facial trauma, blunt; CT HEAD WO CONTRAST ( ); Head trauma, minor (Age >= 65y); CT CERVICAL SPINE WO CONTRAST; Polytrauma, blunt FINDINGS: CT HEAD BRAIN AND VENTRICLES: No acute intracranial hemorrhage. No mass effect or midline shift. No abnormal extra-axial fluid collection. Gray-white differentiation is maintained. No hydrocephalus. Old lacunar infarcts in  the left corona radiata. Mild small vessel ischemic changes. Intracranial atherosclerosis. ORBITS: No acute abnormality. SINUSES AND MASTOIDS: Mucosal thickening/partial opacification of the bilateral ethmoid, bilateral sphenoid, and right maxillary sinuses. SOFT TISSUES AND SKULL: No acute skull fracture. No acute soft tissue abnormality. CT CERVICAL SPINE BONES AND ALIGNMENT: No acute fracture or traumatic malalignment. DEGENERATIVE CHANGES: Mild degenerative changes of the mid cervical spine. SOFT TISSUES: No prevertebral soft tissue swelling. IMPRESSION: 1. No acute intracranial abnormality. Old lacunar infarcts  in the left corona radiata. 2. No traumatic injury to the cervical spine. Electronically signed by: Pinkie Pebbles MD 12/23/2023 03:48 AM EDT RP Workstation: [id number]    ASSESSMENT AND PLAN: This is a very pleasant 63 years old African-American female with Iron Deficiency Anemia Previously diagnosed with iron deficiency anemia and started on iron supplementation, folic acid , and vitamin B12 injections. 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  The patient continues to tolerate her oral iron supplements fairly well with no concerning complaints. Repeat CBC today showed normal hemoglobin and hematocrit.  Iron study, ferritin, serum folate and vitamin B12 are still pending. Assessment and Plan Assessment & Plan Microcytic anemia likely secondary to iron deficiency Microcytic anemia likely due to iron deficiency from gastrointestinal blood loss. Currently on oral iron supplementation with ferrous fumarate , folic acid , and vitamin B12 injections. Hemoglobin level is 12.1, indicating no current anemia. MCV remains low at 72.8, suggesting continued microcytosis. Awaiting results for iron, ferritin, B12, and folic acid  levels to further assess iron status and guide management. - Continue oral iron supplementation with ferrous fumarate . - Continue folic acid  and vitamin B12 injections. - Review pending results for iron, ferritin, B12, and folic acid  levels. - Schedule follow-up in six months if lab results are normal. - Contact her if lab results are abnormal to adjust management. She was advised to call immediately if she has any concerning symptoms in the interval. The patient voices understanding of current disease status and treatment options and is in agreement with the current care plan.  All questions were answered. The patient knows to call the clinic with any problems, questions or concerns. We can certainly see the patient much sooner if necessary.  The total time  spent in the appointment was 20 minutes.  Disclaimer: This note was dictated with voice recognition software. Similar sounding words can inadvertently be transcribed and may not be corrected upon review.       Follow-up visit.

## 2024-01-09 ENCOUNTER — Other Ambulatory Visit: Payer: Self-pay | Admitting: Family Medicine

## 2024-01-09 ENCOUNTER — Ambulatory Visit: Payer: Self-pay

## 2024-01-09 NOTE — Telephone Encounter (Signed)
 Attempted to contact patient regarding recent iron lab results. Left voicemail informing patient that iron levels are elevated. Per Dr. Sherrod, patient is to stop taking the iron supplement for 2 weeks, then resume taking it every other day thereafter. Requested patient to call back with any questions.

## 2024-01-18 ENCOUNTER — Other Ambulatory Visit: Payer: Self-pay | Admitting: Family Medicine

## 2024-01-23 ENCOUNTER — Ambulatory Visit: Admitting: Podiatry

## 2024-01-23 ENCOUNTER — Encounter: Payer: Self-pay | Admitting: Podiatry

## 2024-01-23 DIAGNOSIS — E119 Type 2 diabetes mellitus without complications: Secondary | ICD-10-CM

## 2024-01-23 DIAGNOSIS — B351 Tinea unguium: Secondary | ICD-10-CM

## 2024-01-23 DIAGNOSIS — M79676 Pain in unspecified toe(s): Secondary | ICD-10-CM

## 2024-01-23 DIAGNOSIS — Q828 Other specified congenital malformations of skin: Secondary | ICD-10-CM | POA: Diagnosis not present

## 2024-01-28 ENCOUNTER — Encounter: Payer: Self-pay | Admitting: Podiatry

## 2024-01-28 NOTE — Assessment & Plan Note (Addendum)
 Patient encouraged to maintain heart healthy diet, regular exercise, adequate sleep. Consider daily probiotics. Take medications as prescribed. Labs ordered and reviewed. MGM 07/2023 repeat annuallly, Dexa scan 07/2023 repeat 2027-2029 Last colonoscopy 2025, repeat in 2035. Given and reviewed copy of ACP documents from [employer] and encouraged to complete and return

## 2024-01-28 NOTE — Assessment & Plan Note (Signed)
 Following with pulmonology

## 2024-01-28 NOTE — Assessment & Plan Note (Signed)
 Supplement and monitor

## 2024-01-28 NOTE — Assessment & Plan Note (Signed)
 hgba1c acceptable, minimize simple carbs. Increase exercise as tolerated. Continue current meds

## 2024-01-28 NOTE — Assessment & Plan Note (Signed)
 Well controlled, no changes to meds. Encouraged heart healthy diet such as the DASH diet and exercise as tolerated.

## 2024-01-28 NOTE — Assessment & Plan Note (Signed)
 Persistent symptoms, following with pulmonology

## 2024-01-28 NOTE — Progress Notes (Signed)
Subjective:  Patient ID: Jamie Cox, female    DOB: 1961-01-25,  MRN: [id number]  Jamie Cox presents to clinic today for preventative diabetic foot care and painful porokeratotic lesion(s) of both feet and painful mycotic toenails that limit ambulation. Painful toenails interfere with ambulation. Aggravating factors include wearing enclosed shoe gear. Pain is relieved with periodic professional debridement. Painful porokeratotic lesions are aggravated when weightbearing with and without shoegear. Pain is relieved with periodic professional debridement.  Chief Complaint  Patient presents with   [facility]    Rm17 Diabetic foot care/ Dr. Domenica last visit Oct 09, 2023/A1c 6.7   New problem(s): None.   PCP is Domenica Harlene LABOR, MD.  Allergies  Allergen Reactions   Augmentin  [Amoxicillin -Pot Clavulanate] Hives, Itching and Swelling   Avelox [Moxifloxacin Hcl In Nacl] Diarrhea, Nausea And Vomiting and Other (See Comments)    Dizziness, Cold chills    Bactrim  [Sulfamethoxazole -Trimethoprim ] Hives   Doxycycline      Abdominal discomfort, anorexia   Sulfamethoxazole -Trimethoprim  Hives   Amoxicillin  Other (See Comments)    Review of Systems: Negative except as noted in the HPI.  Objective:  There were no vitals filed for this visit. Jamie Cox is a pleasant 63 y.o. female in NAD. AAO x 3.  Vascular Examination: Capillary refill time immediate b/l. Vascular status intact b/l with palpable pedal pulses. Pedal hair present b/l. No pain with calf compression b/l. Skin temperature gradient WNL b/l. No cyanosis or clubbing b/l. No ischemia or gangrene noted b/l.   Neurological Examination: Sensation grossly intact b/l with 10 gram monofilament. Vibratory sensation intact b/l.   Dermatological Examination: Pedal skin with normal turgor, texture and tone b/l.  No open wounds. No interdigital macerations.   Toenails 1-5 b/l thick, discolored, elongated with subungual debris and pain on  dorsal palpation.   Porokeratotic lesion(s) medial IPJ right great toe, dorsal PIPJ right 2nd toe, submet head 2 left, submet head 3 b/l.Jamie Cox No erythema, no edema, no drainage, no fluctuance.  Musculoskeletal Examination: Muscle strength 5/5 to all lower extremity muscle groups bilaterally. HAV with bunion bilaterally and hammertoes 2-5 b/l. Patient ambulates independent of any assistive aids.  Radiographs: None  Assessment/Plan: 1. Pain due to onychomycosis of toenail   2. Porokeratosis   3. Controlled type 2 diabetes mellitus without complication, without long-term current use of insulin  [facility])    Consent given for treatment. Patient examined. All patient's and/or POA's questions/concerns addressed on today's visit. Mycotic toenails 1-5 debrided in length and girth without incident. Porokeratotic lesion(s) dorsal PIPJ right 2nd toe, submet head 2 left foot, medial IPJ of right great toe, submet head 3 b/l pared and enucleated with sharp debridement without incident.Continue daily foot inspections and monitor blood glucose per PCP/Endocrinologist's recommendations. Continue soft, supportive shoe gear daily. Report any pedal injuries to medical professional. Call office if there are any quesitons/concerns.  Return in about 3 months (around 04/24/2024).  Jamie Cox, DPM      [place] LOCATION: [address], [place] [address]                   Office [phone]   [place] LOCATION: [address] [place], [place] [address] Office [phone])  [phone]  

## 2024-01-28 NOTE — Assessment & Plan Note (Signed)
 Using famotidine daily and using Pantoprazole prn with good results

## 2024-01-29 ENCOUNTER — Ambulatory Visit: Payer: 59 | Admitting: Family Medicine

## 2024-01-29 ENCOUNTER — Encounter: Payer: Self-pay | Admitting: Family Medicine

## 2024-01-29 VITALS — BP 114/70 | HR 70 | Resp 16 | Ht 60.0 in | Wt 107.2 lb

## 2024-01-29 DIAGNOSIS — J441 Chronic obstructive pulmonary disease with (acute) exacerbation: Secondary | ICD-10-CM | POA: Diagnosis not present

## 2024-01-29 DIAGNOSIS — Z23 Encounter for immunization: Secondary | ICD-10-CM

## 2024-01-29 DIAGNOSIS — I10 Essential (primary) hypertension: Secondary | ICD-10-CM | POA: Diagnosis not present

## 2024-01-29 DIAGNOSIS — M0510 Rheumatoid lung disease with rheumatoid arthritis of unspecified site: Secondary | ICD-10-CM

## 2024-01-29 DIAGNOSIS — Z0000 Encounter for general adult medical examination without abnormal findings: Secondary | ICD-10-CM | POA: Diagnosis not present

## 2024-01-29 DIAGNOSIS — E559 Vitamin D deficiency, unspecified: Secondary | ICD-10-CM

## 2024-01-29 DIAGNOSIS — E119 Type 2 diabetes mellitus without complications: Secondary | ICD-10-CM

## 2024-01-29 DIAGNOSIS — K219 Gastro-esophageal reflux disease without esophagitis: Secondary | ICD-10-CM | POA: Diagnosis not present

## 2024-01-29 LAB — COMPREHENSIVE METABOLIC PANEL WITH GFR
ALT: 10 U/L (ref 0–35)
AST: 14 U/L (ref 0–37)
Albumin: 4.1 g/dL (ref 3.5–5.2)
Alkaline Phosphatase: 61 U/L (ref 39–117)
BUN: 10 mg/dL (ref 6–23)
CO2: 27 meq/L (ref 19–32)
Calcium: 9.6 mg/dL (ref 8.4–10.5)
Chloride: 103 meq/L (ref 96–112)
Creatinine, Ser: 0.63 mg/dL (ref 0.40–1.20)
GFR: 93.12 mL/min (ref >60.00)
Glucose, Bld: 82 mg/dL (ref 70–99)
Potassium: 4.4 meq/L (ref 3.5–5.1)
Sodium: 142 meq/L (ref 135–145)
Total Bilirubin: 0.3 mg/dL (ref 0.2–1.2)
Total Protein: 7.9 g/dL (ref 6.0–8.3)

## 2024-01-29 LAB — CBC WITH DIFFERENTIAL/PLATELET
Basophils Absolute: 0 K/uL (ref 0.0–0.1)
Basophils Relative: 0.5 % (ref 0.0–3.0)
Eosinophils Absolute: 0 K/uL (ref 0.0–0.7)
Eosinophils Relative: 0.3 % (ref 0.0–5.0)
HCT: 36.2 % (ref 36.0–46.0)
Hemoglobin: 11.3 g/dL — ABNORMAL LOW (ref 12.0–15.0)
Lymphocytes Relative: 17.7 % (ref 12.0–46.0)
Lymphs Abs: 1.4 K/uL (ref 0.7–4.0)
MCHC: 31.1 g/dL (ref 30.0–36.0)
MCV: 74.5 fl — ABNORMAL LOW (ref 78.0–100.0)
Monocytes Absolute: 0.7 K/uL (ref 0.1–1.0)
Monocytes Relative: 9.5 % (ref 3.0–12.0)
Neutro Abs: 5.6 K/uL (ref 1.4–7.7)
Neutrophils Relative %: 72 % (ref 43.0–77.0)
Platelets: 230 K/uL (ref 150.0–400.0)
RBC: 4.86 Mil/uL (ref 3.87–5.11)
RDW: 16.1 % — ABNORMAL HIGH (ref 11.5–15.5)
WBC: 7.8 K/uL (ref 4.0–10.5)

## 2024-01-29 LAB — TSH: TSH: 0.57 u[IU]/mL (ref 0.35–5.50)

## 2024-01-29 LAB — LIPID PANEL
Cholesterol: 184 mg/dL (ref 0–200)
HDL: 83 mg/dL (ref >39.00)
LDL Cholesterol: 88 mg/dL (ref 0–99)
NonHDL: 101.26
Total CHOL/HDL Ratio: 2
Triglycerides: 68 mg/dL (ref 0.0–149.0)
VLDL: 13.6 mg/dL (ref 0.0–40.0)

## 2024-01-29 LAB — VITAMIN D 25 HYDROXY (VIT D DEFICIENCY, FRACTURES): VITD: 25.23 ng/mL — ABNORMAL LOW (ref 30.00–100.00)

## 2024-01-29 LAB — HEMOGLOBIN A1C: Hgb A1c MFr Bld: 7 % — ABNORMAL HIGH (ref 4.6–6.5)

## 2024-01-29 MED ORDER — CEFDINIR 300 MG PO CAPS: 300.0000 mg | ORAL_CAPSULE | Freq: Two times a day (BID) | ORAL | 0 refills | Status: AC

## 2024-01-29 MED ORDER — HYDROCODONE BIT-HOMATROP MBR 5-1.5 MG/5ML PO SOLN: 5.0000 mL | Freq: Three times a day (TID) | ORAL | 0 refills | Status: DC | PRN

## 2024-01-29 NOTE — Patient Instructions (Addendum)
 Call ENT, PT and ophthalmology for appointments, call us  if unsuccessful  RSV, Respiratory Syncitial Virus Vaccine Arexvy at pharmacy Tetanus and Shingrix  now Covid and flu in 1 month   Preventive Care 65 Years and Older, Female Preventive care refers to lifestyle choices and visits with your health care provider that can promote health and wellness. Preventive care visits are also called wellness exams. What can I expect for my preventive care visit? Counseling Your health care provider may ask you questions about your: Medical history, including: Past medical problems. Family medical history. Pregnancy and menstrual history. History of falls. Current health, including: Memory and ability to understand (cognition). Emotional well-being. Home life and relationship well-being. Sexual activity and sexual health. Lifestyle, including: Alcohol, nicotine or tobacco, and drug use. Access to firearms. Diet, exercise, and sleep habits. Work and work Astronomer. Sunscreen use. Safety issues such as seatbelt and bike helmet use. Physical exam Your health care provider will check your: Height and weight. These may be used to calculate your BMI (body mass index). BMI is a measurement that tells if you are at a healthy weight. Waist circumference. This measures the distance around your waistline. This measurement also tells if you are at a healthy weight and may help predict your risk of certain diseases, such as type 2 diabetes and high blood pressure. Heart rate and blood pressure. Body temperature. Skin for abnormal spots. What immunizations do I need?  Vaccines are usually given at various ages, according to a schedule. Your health care provider will recommend vaccines for you based on your age, medical history, and lifestyle or other factors, such as travel or where you work. What tests do I need? Screening Your health care provider may recommend screening tests for certain  conditions. This may include: Lipid and cholesterol levels. Hepatitis C test. Hepatitis B test. HIV (human immunodeficiency virus) test. STI (sexually transmitted infection) testing, if you are at risk. Lung cancer screening. Colorectal cancer screening. Diabetes screening. This is done by checking your blood sugar (glucose) after you have not eaten for a while (fasting). Mammogram. Talk with your health care provider about how often you should have regular mammograms. BRCA-related cancer screening. This may be done if you have a family history of breast, ovarian, tubal, or peritoneal cancers. Bone density scan. This is done to screen for osteoporosis. Talk with your health care provider about your test results, treatment options, and if necessary, the need for more tests. Follow these instructions at home: Eating and drinking  Eat a diet that includes fresh fruits and vegetables, whole grains, lean protein, and low-fat dairy products. Limit your intake of foods with high amounts of sugar, saturated fats, and salt. Take vitamin and mineral supplements as recommended by your health care provider. Do not drink alcohol if your health care provider tells you not to drink. If you drink alcohol: Limit how much you have to 0-1 drink a day. Know how much alcohol is in your drink. In the [place] one drink equals one 12 oz bottle of beer (355 mL), one 5 oz glass of wine (148 mL), or one 1 oz glass of hard liquor (44 mL). Lifestyle Brush your teeth every morning and night with fluoride toothpaste. Floss one time each day. Exercise for at least 30 minutes 5 or more days each week. Do not use any products that contain nicotine or tobacco. These products include cigarettes, chewing tobacco, and vaping devices, such as e-cigarettes. If you need help quitting, ask your health care  provider. Do not use drugs. If you are sexually active, practice safe sex. Use a condom or other form of protection in order to  prevent STIs. Take aspirin only as told by your health care provider. Make sure that you understand how much to take and what form to take. Work with your health care provider to find out whether it is safe and beneficial for you to take aspirin daily. Ask your health care provider if you need to take a cholesterol-lowering medicine (statin). Find healthy ways to manage stress, such as: Meditation, yoga, or listening to music. Journaling. Talking to a trusted person. Spending time with friends and family. Minimize exposure to UV radiation to reduce your risk of skin cancer. Safety Always wear your seat belt while driving or riding in a vehicle. Do not drive: If you have been drinking alcohol. Do not ride with someone who has been drinking. When you are tired or distracted. While texting. If you have been using any mind-altering substances or drugs. Wear a helmet and other protective equipment during sports activities. If you have firearms in your house, make sure you follow all gun safety procedures. What's next? Visit your health care provider once a year for an annual wellness visit. Ask your health care provider how often you should have your eyes and teeth checked. Stay up to date on all vaccines. This information is not intended to replace advice given to you by your health care provider. Make sure you discuss any questions you have with your health care provider. Document Revised: 11/18/2020 Document Reviewed: 11/18/2020 Elsevier Patient Education  2024 [employer].

## 2024-01-29 NOTE — Progress Notes (Signed)
 Subjective:    Patient ID: Jamie Cox, female    DOB: [birth year], 63 y.o.   MRN: [id number]  Chief Complaint  Patient presents with   Annual Exam    Patient presents today for a physical exam   Quality Metric Gaps    Pap, TDAP, 2nd zoster vaccine    HPI Discussed the use of AI scribe software for clinical note transcription with the patient, who gave verbal consent to proceed.  History of Present Illness Jamie Cox is a 63 year old female who presents for follow-up after a fall and ongoing issues with stumbling and knee pain and for annual preventative exam.  In July, she experienced a fall while walking on an uneven sidewalk, resulting in a minimally displaced fracture of the left nasal bone and mild paranasal sinus mucosal thickening. She has not yet seen an ENT specialist for further evaluation. The nasal area is no longer sore, and there is no significant drainage, redness, or heat in the area.  She continues to experience soreness in her right knee, which she attributes to not properly caring for it. She plans to use Neosporin and a bandage to manage the irritation. No systemic symptoms such as fever or chills are present.  She describes ongoing issues with stumbling and difficulty picking up her feet, which she attributes to a previous episode of pneumonia that led to frequent falls. The issue varies depending on the type of flooring. She has received phone numbers for physical therapy for a gait evaluation but has not yet scheduled an appointment.  She experiences visual disturbances, specifically seeing spots that do not resolve. She has phone numbers for the necessary appointments but has not yet scheduled them.  She reports a persistent cough with some greenish sputum for the past couple of weeks, for which she has previously used cough syrup containing hydrocodone , which she found effective. No fevers or chills are present.  Her son, aged 68, is currently  hospitalized with myocarditis, which has been emotionally distressing for her. She reports difficulty with sleep due to a persistent cough.    Past Medical History:  Diagnosis Date   Allergy    Asthma    Atypical chest pain    Chronic sinusitis    Followed by Dr. 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   History of chicken pox    Hypercalcemia 07/19/2015   Hypertension    Osteoporosis, unspecified    steroid induced   Personal history of other diseases of circulatory system    Rheumatoid arthritis(714.0)    Unspecified deficiency anemia    microcytic   Urticaria     Past Surgical History:  Procedure Laterality Date   NASAL SINUS SURGERY  06/2012   NASAL SINUS SURGERY     2010   OPEN REDUCTION INTERNAL FIXATION (ORIF) METACARPAL Right 07/13/2022   Procedure: OPEN REDUCTION INTERNAL FIXATION METACARPAL RIGHT SMALL FINGER;  Surgeon: Shari Easter, MD;  Location: [facility] OR;  Service: Orthopedics;  Laterality: Right;  regional with iv sedation    Family History  Problem Relation Age of Onset   Scleroderma Mother    Heart disease Mother    Kidney disease Mother    Other Mother    Prostate cancer Father    Diabetes Father    Heart disease Father        CHF   Arthritis Father  s/p hip replacement   Cancer Father        lung cancer with mets, smoker   Other Sister        Cardiomegaly   Arthritis Sister    Heart disease Sister        rare   Sleep apnea Brother    Heart disease Brother    Arthritis Daughter    Heart disease Son    Hyperlipidemia Son    Lung cancer Paternal Uncle    Liver cancer Maternal Grandmother    Hypertension Maternal Grandmother    Heart attack Maternal Grandmother    Coronary artery disease Paternal Grandmother    Hypertension Paternal Grandmother    Liver cancer Paternal Grandmother    Alzheimer's disease Paternal Grandfather    Lupus Cousin     Arthritis Cousin    Colon cancer Neg Hx    Asthma Neg Hx    Allergic rhinitis Neg Hx    Eczema Neg Hx    Immunodeficiency Neg Hx    Angioedema Neg Hx    Esophageal cancer Neg Hx    Rectal cancer Neg Hx    Stomach cancer Neg Hx    Colon polyps Neg Hx     Social History   Socioeconomic History   Marital status: Married    Spouse name: Not on file   Number of children: 2   Years of education: Not on file   Highest education level: Not on file  Occupational History   Occupation: Data processing manager  Tobacco Use   Smoking status: Never   Smokeless tobacco: Never  Vaping Use   Vaping status: Never Used  Substance and Sexual Activity   Alcohol use: No    Alcohol/week: 0.0 standard drinks of alcohol   Drug use: No   Sexual activity: Not Currently  Other Topics Concern   Not on file  Social History Narrative   Not on file   Social Drivers of Health   Financial Resource Strain: Not on file  Food Insecurity: Not on file  Transportation Needs: No Transportation Needs (03/10/2023)   PRAPARE - Administrator, Civil Service (Medical): No    Lack of Transportation (Non-Medical): No  Physical Activity: Not on file  Stress: Not on file  Social Connections: Not on file  Intimate Partner Violence: Not on file    Outpatient Medications Prior to Visit  Medication Sig Dispense Refill   albuterol  (VENTOLIN  HFA) 108 (90 Base) MCG/ACT inhaler INHALE ONE TO TWO PUFFS INTO THE LUNGS EVERY 6 HOURS AS NEEDED FOR WHEEZING OR SHORTNESS OF BREATH 18 each 5   atorvastatin  (LIPITOR) 10 MG tablet Take 1 tablet by mouth once daily 90 tablet 0   benzonatate  (TESSALON ) 100 MG capsule Take 1 capsule (100 mg total) by mouth 3 (three) times daily as needed for cough. 30 capsule 0   Blood Glucose Monitoring Suppl DEVI 1 each by Does not apply route 3 (three) times daily. May substitute to any manufacturer covered by patient's insurance. 1 each 0   budesonide -formoterol  (SYMBICORT ) 160-4.5  MCG/ACT inhaler Inhale 2 puffs into the lungs 2 (two) times daily. 10.2 g 12   calcium -vitamin D  (OSCAL WITH D) 500-5 MG-MCG tablet Take 1 tablet by mouth daily with breakfast.     famotidine  (PEPCID ) 40 MG tablet Take 1 tablet (40 mg total) by mouth daily. 90 tablet 1   Ferrous Fumarate -Folic Acid  (HEMATINIC/FOLIC ACID ) 324-1 MG TABS Take 1 tablet by mouth daily. 90 tablet 1  fluticasone  (FLONASE ) 50 MCG/ACT nasal spray Place 2 sprays into both nostrils daily. 16 g 5   gabapentin  (NEURONTIN ) 300 MG capsule Take 1 capsule (300 mg total) by mouth 2 (two) times daily as needed. 180 capsule 1   Glucose Blood (BLOOD GLUCOSE TEST STRIPS) STRP 1 each by Does not apply route 3 (three) times daily. May substitute to any manufacturer covered by patient's insurance. 100 strip 3   HYDROcodone -acetaminophen  (NORCO/VICODIN) 5-325 MG tablet Take 1 tablet by mouth every 6 (six) hours as needed for moderate pain (pain score 4-6) or severe pain (pain score 7-10). 40 tablet 0   levocetirizine (XYZAL ) 5 MG tablet Take 1 tablet (5 mg total) by mouth every evening. 30 tablet 4   meloxicam  (MOBIC ) 7.5 MG tablet TAKE 1 TO 2 TABLETS BY MOUTH ONCE DAILY AS NEEDED FOR PAIN 60 tablet 03   metoprolol  succinate (TOPROL -XL) 25 MG 24 hr tablet Take 1 tablet (25 mg total) by mouth daily. Take with or immediately following a meal 90 tablet 0   montelukast  (SINGULAIR ) 10 MG tablet TAKE 1 TABLET BY MOUTH AT BEDTIME 90 tablet 3   pantoprazole  (PROTONIX ) 20 MG tablet Take 1 tablet (20 mg total) by mouth daily before breakfast. 90 tablet 3   predniSONE  (DELTASONE ) 2.5 MG tablet Take 1 tablet by mouth once daily with breakfast 90 tablet 0   Pseudoephedrine-Guaifenesin  (MUCINEX  D MAX STRENGTH) [phone] MG TB12 Take 1 tablet by mouth 2 (two) times daily.     Facility-Administered Medications Prior to Visit  Medication Dose Route Frequency Provider Last Rate Last Admin   [START ON 04/30/2024] denosumab  (PROLIA ) injection 60 mg  60 mg  Subcutaneous Q6 months Domenica Harlene LABOR, MD        Allergies  Allergen Reactions   Augmentin  [Amoxicillin -Pot Clavulanate] Hives, Itching and Swelling   Avelox [Moxifloxacin Hcl In Nacl] Diarrhea, Nausea And Vomiting and Other (See Comments)    Dizziness, Cold chills    Bactrim  [Sulfamethoxazole -Trimethoprim ] Hives   Doxycycline      Abdominal discomfort, anorexia   Sulfamethoxazole -Trimethoprim  Hives   Amoxicillin  Other (See Comments)    Review of Systems  Constitutional:  Negative for chills, fever and malaise/fatigue.  HENT:  Positive for congestion. Negative for hearing loss.   Eyes:  Negative for discharge.  Respiratory:  Positive for cough and sputum production. Negative for shortness of breath.   Cardiovascular:  Negative for chest pain, palpitations and leg swelling.  Gastrointestinal:  Negative for abdominal pain, blood in stool, constipation, diarrhea, heartburn, nausea and vomiting.  Genitourinary:  Negative for dysuria, frequency, hematuria and urgency.  Musculoskeletal:  Negative for back pain, falls and myalgias.  Skin:  Negative for rash.  Neurological:  Negative for dizziness, sensory change, loss of consciousness, weakness and headaches.  Endo/Heme/Allergies:  Negative for environmental allergies. Does not bruise/bleed easily.  Psychiatric/Behavioral:  Negative for depression and suicidal ideas. The patient is nervous/anxious. The patient does not have insomnia.        Objective:    Physical Exam Constitutional:      General: She is not in acute distress.    Appearance: Normal appearance. She is not diaphoretic.  HENT:     Head: Normocephalic and atraumatic.     Right Ear: Tympanic membrane, ear canal and external ear normal.     Left Ear: Tympanic membrane, ear canal and external ear normal.     Nose: Nose normal.     Mouth/Throat:     Mouth: Mucous membranes are moist.  Pharynx: Oropharynx is clear. No oropharyngeal exudate.  Eyes:     General: No  scleral icterus.       Right eye: No discharge.        Left eye: No discharge.     Conjunctiva/sclera: Conjunctivae normal.     Pupils: Pupils are equal, round, and reactive to light.  Neck:     Thyroid : No thyromegaly.  Cardiovascular:     Rate and Rhythm: Normal rate and regular rhythm.     Heart sounds: Normal heart sounds. No murmur heard. Pulmonary:     Effort: Pulmonary effort is normal. No respiratory distress.     Breath sounds: Rhonchi present. No wheezing or rales.  Abdominal:     General: Bowel sounds are normal. There is no distension.     Palpations: Abdomen is soft. There is no mass.     Tenderness: There is no abdominal tenderness.  Musculoskeletal:        General: No tenderness. Normal range of motion.     Cervical back: Normal range of motion and neck supple.  Lymphadenopathy:     Cervical: No cervical adenopathy.  Skin:    General: Skin is warm and dry.     Findings: No rash.  Neurological:     General: No focal deficit present.     Mental Status: She is alert and oriented to person, place, and time.     Cranial Nerves: No cranial nerve deficit.     Coordination: Coordination normal.     Deep Tendon Reflexes: Reflexes are normal and symmetric. Reflexes normal.  Psychiatric:        Mood and Affect: Mood normal.        Behavior: Behavior normal.        Thought Content: Thought content normal.        Judgment: Judgment normal.     BP 114/70   Pulse 70   Resp 16   Ht 5' (1.524 m)   Wt 107 lb 3.2 oz (48.6 kg)   SpO2 95%   BMI 20.94 kg/m  Wt Readings from Last 3 Encounters:  01/29/24 107 lb 3.2 oz (48.6 kg)  01/08/24 112 lb (50.8 kg)  01/03/24 108 lb 6.4 oz (49.2 kg)    Diabetic Foot Exam - Simple   No data filed    Lab Results  Component Value Date   WBC 7.4 01/08/2024   HGB 12.1 01/08/2024   HCT 37.8 01/08/2024   PLT 198 01/08/2024   GLUCOSE 96 01/08/2024   CHOL 176 10/09/2023   TRIG 101.0 10/09/2023   HDL 76.70 10/09/2023   LDLCALC 79  10/09/2023   ALT 17 01/08/2024   AST 16 01/08/2024   NA 140 01/08/2024   K 3.8 01/08/2024   CL 105 01/08/2024   CREATININE 0.82 01/08/2024   BUN 23 01/08/2024   CO2 27 01/08/2024   TSH 0.79 10/09/2023   INR 1.0 11/25/2022   HGBA1C 6.7 (H) 10/09/2023   MICROALBUR 3.8 (H) 10/09/2023    Lab Results  Component Value Date   TSH 0.79 10/09/2023   Lab Results  Component Value Date   WBC 7.4 01/08/2024   HGB 12.1 01/08/2024   HCT 37.8 01/08/2024   MCV 72.8 (L) 01/08/2024   PLT 198 01/08/2024   Lab Results  Component Value Date   NA 140 01/08/2024   K 3.8 01/08/2024   CO2 27 01/08/2024   GLUCOSE 96 01/08/2024   BUN 23 01/08/2024   CREATININE 0.82 01/08/2024  BILITOT 0.2 01/08/2024   ALKPHOS 77 01/08/2024   AST 16 01/08/2024   ALT 17 01/08/2024   PROT 7.7 01/08/2024   ALBUMIN 4.1 01/08/2024   CALCIUM  9.8 01/08/2024   ANIONGAP 8 01/08/2024   GFR 71.07 10/09/2023   Lab Results  Component Value Date   CHOL 176 10/09/2023   Lab Results  Component Value Date   HDL 76.70 10/09/2023   Lab Results  Component Value Date   LDLCALC 79 10/09/2023   Lab Results  Component Value Date   TRIG 101.0 10/09/2023   Lab Results  Component Value Date   CHOLHDL 2 10/09/2023   Lab Results  Component Value Date   HGBA1C 6.7 (H) 10/09/2023       Assessment & Plan:  Asthma, chronic obstructive, with acute exacerbation (HCC) Assessment & Plan: Persistent symptoms, following with pulmonology   Controlled type 2 diabetes mellitus without complication, without long-term current use of insulin  (HCC) Assessment & Plan: hgba1c acceptable, minimize simple carbs. Increase exercise as tolerated. Continue current meds  Orders: -     Hemoglobin A1c -     Lipid panel -     Lipid panel -     Hemoglobin A1c  Gastroesophageal reflux disease, unspecified whether esophagitis present Assessment & Plan: Using famotidine  daily and using Pantoprazole  prn with good results  Orders: -      Lipid panel  Hypertension, unspecified type Assessment & Plan: Well controlled, no changes to meds. Encouraged heart healthy diet such as the DASH diet and exercise as tolerated.    Orders: -     Comprehensive metabolic panel with GFR -     CBC with Differential/Platelet -     Comprehensive metabolic panel with GFR -     TSH  Vitamin D  deficiency Assessment & Plan: Supplement and monitor   Orders: -     VITAMIN D  25 Hydroxy (Vit-D Deficiency, Fractures) -     VITAMIN D  25 Hydroxy (Vit-D Deficiency, Fractures)  Rheumatoid lung disease (HCC) Assessment & Plan: Following with pulmonology   Preventative health care Assessment & Plan: Patient encouraged to maintain heart healthy diet, regular exercise, adequate sleep. Consider daily probiotics. Take medications as prescribed. Labs ordered and reviewed. MGM 07/2023 repeat annuallly, Dexa scan 07/2023 repeat 2027-2029 Last colonoscopy 2025, repeat in 2035. Given and reviewed copy of ACP documents from [facility] Secretary of State and encouraged to complete and return    Need for shingles vaccine -     Varicella-zoster vaccine IM  Other orders -     HYDROcodone  Bit-Homatrop MBr; Take 5 mLs by mouth every 8 (eight) hours as needed for cough.  Dispense: 150 mL; Refill: 0 -     Cefdinir ; Take 1 capsule (300 mg total) by mouth 2 (two) times daily for 10 days.  Dispense: 20 capsule; Refill: 0    Assessment and Plan Assessment & Plan Adult Wellness Visit Routine wellness visit with emphasis on maintaining health screenings and vaccinations. Pap smear overdue since 2017. Discussed advanced directives. - Schedule Pap smear for January 2026 - Provide information on advanced directives and encourage completion - Administer second shingles vaccine - Administer tetanus vaccine - Recommend RSV vaccine (Rexvi) - Plan for COVID booster and flu shot in September or October  Chronic cough with acute exacerbation of COPD Chronic cough with recent  exacerbation, affecting sleep. No fever or chills. Green sputum suggests possible infection. Previous treatments with codeine  and hydrocodone  were effective. - Prescribe hydrocodone  cough syrup - Prescribe cefdinir   for 10 days, with instructions to stop if symptoms improve after 7 days  Rheumatoid arthritis with rheumatoid lung disease  Right knee pain and skin irritation after fall Right knee pain and skin irritation post-fall in July. No infection signs such as redness, heat, or drainage. Persistent skin irritation without systemic symptoms like fever or chills. - Continue using Neosporin and bandage on the knee  Minimally displaced fracture of left nasal bone, healing Healing minimally displaced fracture of the left nasal bone with mild paranasal sinus mucosal thickening. Symptoms improving, but ENT evaluation recommended to prevent potential breathing issues due to congestion. - Encourage follow-up with ENT for nasal fracture evaluation  Gait instability and recurrent falls Gait instability and recurrent falls since previous pneumonia episode. Difficulty picking up feet, leading to stumbling. Physical therapy recommended for gait evaluation and strengthening exercises. - Encourage follow-up with physical therapy for gait evaluation and strengthening exercises  Recording duration: 35 minutes     Harlene Horton, MD

## 2024-01-30 ENCOUNTER — Ambulatory Visit: Payer: Self-pay | Admitting: Family Medicine

## 2024-02-01 ENCOUNTER — Telehealth: Payer: Self-pay | Admitting: Family Medicine

## 2024-02-01 NOTE — Progress Notes (Signed)
 She needs the note for son hospitalization.

## 2024-02-01 NOTE — Telephone Encounter (Signed)
 Copied from CRM [id number]. Topic: Clinical - Medication Refill >> Feb 01, 2024  4:10 PM Dedra B wrote: Medication: HYDROcodone -acetaminophen  (NORCO/VICODIN) 5-325 MG tablet  Has the patient contacted their pharmacy? No, calls office and forgot to ask at last appt   This is the patient's preferred pharmacy:  [id number] - [place] ([place] - [address]  ([place]) [place] [address] Phone: [phone] Fax: [phone]   Is this the correct pharmacy for this prescription? Yes  Has the prescription been filled recently? No  Is the patient out of the medication? No, 1 left  Has the patient been seen for an appointment in the last year OR does the patient have an upcoming appointment? Yes  Can we respond through MyChart? Yes  Agent: Please be advised that Rx refills may take up to 3 business days. We ask that you follow-up with your pharmacy.

## 2024-02-01 NOTE — Telephone Encounter (Signed)
 Copied from CRM [id number]. Topic: General - Other >> Feb 01, 2024  4:13 PM Dedra B wrote: Reason for CRM: Pt called regarding a doctor's note that was supposed to be left at the desk. Pls call pt.

## 2024-02-02 ENCOUNTER — Other Ambulatory Visit: Payer: Self-pay | Admitting: Family Medicine

## 2024-02-02 DIAGNOSIS — M069 Rheumatoid arthritis, unspecified: Secondary | ICD-10-CM

## 2024-02-02 NOTE — Telephone Encounter (Unsigned)
 Copied from CRM [id number]. Topic: Clinical - Medication Refill >> Feb 02, 2024 12:05 PM Dedra B wrote: Medication: HYDROcodone -acetaminophen  (NORCO/VICODIN) 5-325 MG tablet  Has the patient contacted their pharmacy? No  This is the patient's preferred pharmacy:  [id number] - [place] - [address] [place] ([place]) [place] [address] Phone: [phone] Fax: [phone]   Is this the correct pharmacy for this prescription? Yes   Has the prescription been filled recently? No  Is the patient out of the medication? Yes  Has the patient been seen for an appointment in the last year OR does the patient have an upcoming appointment? Yes  Can we respond through MyChart? Yes  Agent: Please be advised that Rx refills may take up to 3 business days. We ask that you follow-up with your pharmacy.

## 2024-02-02 NOTE — Telephone Encounter (Signed)
   Requesting: hydrocodone  5-325mg   Contract: 01/03/24 UDS: 01/03/2024 Last Visit: 01/29/2024 Next Visit: 07/01/2024 Last Refill: 11/03/23 #40 and 0RF    Please Advise

## 2024-02-03 MED ORDER — HYDROCODONE-ACETAMINOPHEN 5-325 MG PO TABS: 1.0000 | ORAL_TABLET | Freq: Four times a day (QID) | ORAL | 0 refills | Status: DC | PRN

## 2024-02-06 NOTE — Progress Notes (Signed)
 Patient was advised of labs and verbalized understanding.

## 2024-02-08 ENCOUNTER — Institutional Professional Consult (permissible substitution) (INDEPENDENT_AMBULATORY_CARE_PROVIDER_SITE_OTHER): Admitting: Physician Assistant

## 2024-02-08 ENCOUNTER — Institutional Professional Consult (permissible substitution) (INDEPENDENT_AMBULATORY_CARE_PROVIDER_SITE_OTHER)

## 2024-02-16 ENCOUNTER — Telehealth: Payer: Self-pay | Admitting: *Deleted

## 2024-02-16 NOTE — Telephone Encounter (Signed)
 Try some honey and see Dr. Domenica next week if still having issues. Thx.

## 2024-02-16 NOTE — Telephone Encounter (Signed)
 Copied from CRM #[id number]. Topic: Clinical - Medical Advice >> Feb 16, 2024 12:43 PM Jamie Cox wrote: Reason for CRM: Patient is calling stating that her cough has not got better, she stated she saw Dr. Domenica a few weeks ago and has not improved. Patient is asking if they can prescribe something else to help her.

## 2024-02-19 ENCOUNTER — Other Ambulatory Visit

## 2024-02-19 NOTE — Telephone Encounter (Signed)
 Patient stated that she will try honey and follow up if needed.

## 2024-02-20 ENCOUNTER — Other Ambulatory Visit

## 2024-02-21 ENCOUNTER — Institutional Professional Consult (permissible substitution) (INDEPENDENT_AMBULATORY_CARE_PROVIDER_SITE_OTHER): Admitting: Physician Assistant

## 2024-02-27 ENCOUNTER — Encounter (INDEPENDENT_AMBULATORY_CARE_PROVIDER_SITE_OTHER): Payer: Self-pay

## 2024-02-27 ENCOUNTER — Institutional Professional Consult (permissible substitution) (INDEPENDENT_AMBULATORY_CARE_PROVIDER_SITE_OTHER): Admitting: Physician Assistant

## 2024-02-27 ENCOUNTER — Ambulatory Visit: Payer: Self-pay

## 2024-02-27 NOTE — Telephone Encounter (Signed)
 Appt scheduled

## 2024-02-27 NOTE — Telephone Encounter (Signed)
 Patient unable to come in for an appointment today due to having to pick up kids at school  Patient warm transferred over to CAL for further assistance in scheduling   FYI Only or Action Required?: Action required by provider: update on patient condition.  Patient was last seen in primary care on 01/29/2024 by Domenica Harlene LABOR, MD.  Called Nurse Triage reporting Cough.  Symptoms began almost two months ago per patient.  Interventions attempted: Rest, hydration, or home remedies and Other: honey.  Symptoms are: gradually worsening.  Triage Disposition: See Physician Within 24 Hours  Patient/caregiver understands and will follow disposition?: No--no availability in PCP office within 24 hours---patient tried honey as suggested through messenger by dr frann.  She states still not better.                  Copied from CRM #[id number]. Topic: Clinical - Red Word Triage >> Feb 27, 2024  1:17 PM Rosina D wrote: Reason for RMF:[id number] cough and coughing up mucus that is greenish/ ribs sore because she is coughing day and night Reason for Disposition  [1] Continuous (nonstop) coughing interferes with work or school AND [2] no improvement using cough treatment per Care Advice  Answer Assessment - Initial Assessment Questions Patient denies any difficulty breathing and states she only feels sore when she coughs Coughing up thick green mucous Patient unable to come in for an appointment today due to having to pick up kids at school Patient tried honey as suggested through messenger by Dr frann.  She states still not better. This RN advised the patient that the recommendation for her symptoms is to be seen within 24 hours and nothing else was available for this worsening of her cough at her PCP office so this RN called [facility] to advise them and see about getting patient scheduled Patient is advised that if anything changes to give us  a call and if anything worsens to go to the  Emergency Room She verbalized understanding. Patient warm transferred over to CAL for further assistance in scheduling     1. ONSET: When did the cough begin?      Almost two months 2. SEVERITY: How bad is the cough today?      persistent 3. SPUTUM: Describe the color of your sputum (e.g., none, dry cough; clear, white, yellow, green)     Thick greenish 4. HEMOPTYSIS: Are you coughing up any blood? If Yes, ask: How much? (e.g., flecks, streaks, tablespoons, etc.)     no 5. DIFFICULTY BREATHING: Are you having difficulty breathing? If Yes, ask: How bad is it? (e.g., mild, moderate, severe)      denies 6. FEVER: Do you have a fever? If Yes, ask: What is your temperature, how was it measured, and when did it start?     no 7. CARDIAC HISTORY: Do you have any history of heart disease? (e.g., heart attack, congestive heart failure)      -------- 8. LUNG HISTORY: Do you have any history of lung disease?  (e.g., pulmonary embolus, asthma, emphysema)     Copd, asthma 9. PE RISK FACTORS: Do you have a history of blood clots? (or: recent major surgery, recent prolonged travel, bedridden)     no 10. OTHER SYMPTOMS: Do you have any other symptoms? (e.g., runny nose, wheezing, chest pain)       Headaches some mornings, soreness from coughing  Protocols used: Cough - Acute Productive-A-AH

## 2024-02-29 ENCOUNTER — Ambulatory Visit: Admitting: Student

## 2024-03-01 ENCOUNTER — Ambulatory Visit: Admitting: Family Medicine

## 2024-03-01 ENCOUNTER — Encounter: Payer: Self-pay | Admitting: Family Medicine

## 2024-03-01 VITALS — BP 124/68 | HR 50 | Temp 97.7°F | Resp 20 | Ht 60.0 in | Wt 101.8 lb

## 2024-03-01 DIAGNOSIS — J441 Chronic obstructive pulmonary disease with (acute) exacerbation: Secondary | ICD-10-CM | POA: Diagnosis not present

## 2024-03-01 DIAGNOSIS — J069 Acute upper respiratory infection, unspecified: Secondary | ICD-10-CM

## 2024-03-01 MED ORDER — IPRATROPIUM-ALBUTEROL 0.5-2.5 (3) MG/3ML IN SOLN: 3.0000 mL | Freq: Once | RESPIRATORY_TRACT | Status: AC

## 2024-03-01 MED ORDER — PROMETHAZINE-DM 6.25-15 MG/5ML PO SYRP: 5.0000 mL | ORAL_SOLUTION | Freq: Four times a day (QID) | ORAL | 0 refills | Status: AC | PRN

## 2024-03-01 MED ORDER — PREDNISONE 20 MG PO TABS: 40.0000 mg | ORAL_TABLET | Freq: Every day | ORAL | 0 refills | Status: AC

## 2024-03-01 MED ORDER — AZITHROMYCIN 250 MG PO TABS: ORAL_TABLET | ORAL | 0 refills | Status: AC

## 2024-03-01 NOTE — Patient Instructions (Signed)
 Continue to push fluids, practice good hand hygiene, and cover your mouth if you cough. ? ?If you start having fevers, shaking or shortness of breath, seek immediate care. ? ?OK to take Tylenol 1000 mg (2 extra strength tabs) or 975 mg (3 regular strength tabs) every 6 hours as needed. ? ?Let us know if you need anything. ?

## 2024-03-01 NOTE — Progress Notes (Signed)
 Chief Complaint  Patient presents with   Cough    Onset: 8 weeks - dry     Montie LITTIE Kitty here for URI complaints.  Duration: 8 weeks  Associated symptoms: rhinorrhea and productive coughing Denies: sinus congestion, sinus pain, itchy watery eyes, ear pain, ear drainage, sore throat, wheezing, fevers, and shortness of breath Treatment to date: honey, Nyquil Sick contacts: No  Past Medical History:  Diagnosis Date   Allergy    Asthma    Atypical chest pain    Chronic sinusitis    Followed by Dr. 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   History of chicken pox    Hypercalcemia 07/19/2015   Hypertension    Osteoporosis, unspecified    steroid induced   Personal history of other diseases of circulatory system    Rheumatoid arthritis(714.0)    Unspecified deficiency anemia    microcytic   Urticaria     Objective BP 124/68   Pulse (!) 50   Temp 97.7 F (36.5 C)   Resp 20   Ht 5' (1.524 m)   Wt 101 lb 12.8 oz (46.2 kg)   SpO2 95%   BMI 19.88 kg/m  General: Awake, alert, appears stated age HEENT: AT, [place], ears patent b/l and TM's neg, nares patent w/ clear discharge, pharynx pink and without exudates, MMM, no sinus ttp Neck: No masses or asymmetry Heart: Reg rhythm, bradycardic Lungs: CTAB though decreased airflow, no accessory muscle use Psych: Age appropriate judgment and insight, normal mood and affect  Asthma, chronic obstructive, with acute exacerbation (HCC) - Plan: ipratropium-albuterol  (DUONEB) 0.5-2.5 (3) MG/3ML nebulizer solution 3 mL  Viral URI with cough - Plan: promethazine -dextromethorphan  (PROMETHAZINE -DM) 6.25-15 MG/5ML syrup  Neb treatment today.  Felt a little bit better after that.  5-day prednisone  burst 40 mg daily.  Pocket prescription for Z-Pak provided.  If no improvement in next 2 to 3 days, she will take this.  Cough syrup as above.  Warnings about  drowsiness verbalized.  Viral etiology likely contributing to exacerbation.  Continue to push fluids, practice good hand hygiene, cover mouth when coughing. F/u prn. If starting to experience fevers, shaking, or worsening shortness of breath, seek immediate care. Pt voiced understanding and agreement to the plan.  Mabel Mt [place], DO 03/01/24 2:04 PM

## 2024-04-03 ENCOUNTER — Telehealth: Payer: Self-pay

## 2024-04-03 ENCOUNTER — Other Ambulatory Visit (HOSPITAL_COMMUNITY): Payer: Self-pay

## 2024-04-03 NOTE — Telephone Encounter (Signed)
 Pt ready for scheduling for PROLIA  on or after : 05/04/24  Option# 1: Buy/Bill (Office supplied medication)  Out-of-pocket cost due at time of clinic visit: $20  Number of injection/visits approved: 2  Primary: [employer] Prolia  co-insurance: $10 Admin fee co-insurance: $10  Secondary---  Prolia  co-insurance:  Admin fee co-insurance:   Medical Benefit Details: Date Benefits were checked: 04/03/24 Deductible: $1250 Met of $1250 Required/ Coinsurance: $10/ Admin Fee: $10  Prior Auth: APPROVED PA# [id number] Expiration Date: 06/20/23-06/18/24  # of doses approved: 2 ----------------------------------------------------------------------- Option# 2- Med Obtained from pharmacy:  Pharmacy benefit: Copay $--- (Paid to pharmacy) Admin Fee: --- (Pay at clinic)  Prior Auth: --- PA# Expiration Date:   # of doses approved:   If patient wants fill through the pharmacy benefit please send prescription to: ---, and include estimated need by date in rx notes. Pharmacy will ship medication directly to the office.  Patient --- eligible for Prolia  Copay Card. Copay Card can make patient's cost as little as $25. Link to apply: [email or web address]://[email or web address]  ** This summary of benefits is an estimation of the patient's out-of-pocket cost. Exact cost may very based on individual plan coverage.

## 2024-04-03 NOTE — Telephone Encounter (Signed)
 Prolia  VOB initiated via [email or web address]  Next Prolia  inj DUE: 05/04/24

## 2024-04-03 NOTE — Telephone Encounter (Signed)
 [name]

## 2024-04-04 ENCOUNTER — Other Ambulatory Visit: Payer: Self-pay | Admitting: Family

## 2024-04-08 ENCOUNTER — Ambulatory Visit: Admitting: Family Medicine

## 2024-04-09 ENCOUNTER — Other Ambulatory Visit (HOSPITAL_COMMUNITY): Payer: Self-pay

## 2024-04-12 ENCOUNTER — Ambulatory Visit

## 2024-04-12 ENCOUNTER — Ambulatory Visit: Payer: Self-pay

## 2024-04-12 NOTE — Telephone Encounter (Unsigned)
 Copied from CRM [id number]. Topic: Clinical - Prescription Issue >> Apr 12, 2024  3:00 PM Alfonso HERO wrote: Reason for CRM: patient calling for update on predniSONE  (DELTASONE ) 2.5 MG tablet refill request. She said its supposed to go to Dr Carleton

## 2024-04-12 NOTE — Telephone Encounter (Signed)
 FYI Only or Action Required?: FYI only for provider: appointment scheduled on 11.10.25.  Patient was last seen in primary care on 03/01/2024 by Frann Mabel Mt, DO.  Called Nurse Triage reporting Cough.  Symptoms began about a month ago.  Interventions attempted: Prescription medications: prednisone , atbx, promethazine , inhaler, nebulizer .  Symptoms are: unchanged.  Triage Disposition: See Physician Within 24 Hours  Patient/caregiver understands and will follow disposition?: Yes   Copied from CRM #[id number]. Topic: Clinical - Red Word Triage >> Apr 12, 2024  9:07 AM Adelita E wrote: [facility] that prompted transfer to Nurse Triage: Worsening cough, going on since September. Reason for Disposition  SEVERE coughing spells (e.g., whooping sound after coughing, vomiting after coughing)  Answer Assessment - Initial Assessment Questions Pt states that she did start to feel slightly better after the medications (prednisone  and atbx) but then it came right back. She states she has been taking the cough medicine but probably not like she should. RN asked if she had nebulizer at home or only inhaler. She said she has both but hasn't done the nebulizer in a couple of weeks. RN instructed patient to start using the nebulizer as instructed over the weekend to see if that helps. RN offered multiple appts today but patient states she is unable to as she is watchign grandkids. She stated Monday would be better.     1. ONSET: When did the cough begin?      September 2. SEVERITY: How bad is the cough today?      10 at times throughout the day.  3. SPUTUM: Describe the color of your sputum (e.g., none, dry cough; clear, white, yellow, green)     Cloudy, green 4. HEMOPTYSIS: Are you coughing up any blood? If Yes, ask: How much? (e.g., flecks, streaks, tablespoons, etc.)     no 5. DIFFICULTY BREATHING: Are you having difficulty breathing? If Yes, ask: How bad is it? (e.g., mild,  moderate, severe)      Shortness of breath with the cough 6. FEVER: Do you have a fever? If Yes, ask: What is your temperature, how was it measured, and when did it start?     no 7. CARDIAC HISTORY: Do you have any history of heart disease? (e.g., heart attack, congestive heart failure)      no 8. LUNG HISTORY: Do you have any history of lung disease?  (e.g., pulmonary embolus, asthma, emphysema)     asthma 9. PE RISK FACTORS: Do you have a history of blood clots? (or: recent major surgery, recent prolonged travel, bedridden)     no 10. OTHER SYMPTOMS: Do you have any other symptoms? (e.g., runny nose, wheezing, chest pain)       Runny nose, wheezing with the cough  Protocols used: Cough - Acute Productive-A-AH

## 2024-04-13 NOTE — Progress Notes (Unsigned)
 [facility] [address], [address] [place], [place] [address] [phone]  Date:  04/15/2024   Name:  Jamie Cox   DOB:  06-Mar-1961   MRN:  [id number]  PCP:  Domenica Harlene LABOR, MD    Chief Complaint: No chief complaint on file.   History of Present Illness:  Jamie Cox is a 63 y.o. very pleasant female patient who presents with the following:  Primary patient of my partner Dr.Blyth seen today with concern of cough-I have seen her couple of times in the past She was seen by Dr. Frann in late September at which point he noted an asthma exacerbation She has history of asthma, diabetes, elevated blood pressure, rheumatoid lung disease  Discussed the use of AI scribe software for clinical note transcription with the patient, who gave verbal consent to proceed.  History of Present Illness   - Flu shot-recommend COVID booster Patient Active Problem List   Diagnosis Date Noted   Fall 01/03/2024   Bilateral shoulder pain 11/10/2023   Elevated BP without diagnosis of hypertension 11/10/2023   Hypertension    Vitamin D  deficiency 06/17/2023   BRBPR (bright red blood per rectum) 06/17/2023   Pernicious anemia 06/17/2023   Pain of right lower leg 06/17/2023   Left otitis media 04/12/2022   Bronchitis 04/12/2022   Paresthesias 12/01/2021   Peroneal tendinitis of right lower extremity 07/09/2021   Leg length discrepancy 07/09/2021   Controlled type 2 diabetes mellitus without complication, without long-term current use of insulin  (HCC) 09/28/2020   Chronic rhinitis 09/23/2020   Bilateral impacted cerumen 08/03/2020   Atypical chest pain 08/09/2017   Onychomycosis 05/08/2017   Cough variant asthma with possible UACS component  05/05/2017   Asthma, chronic obstructive, with acute exacerbation (HCC) 02/13/2017   Allergy 08/03/2016   Maxillary sinusitis, chronic 08/03/2016   Bronchiectasis with (acute) exacerbation  (HCC) 06/29/2016   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   Cervical cancer screening 03/04/2016   Hypercalcemia 07/19/2015   History of chicken pox    On prednisone  therapy 09/25/2013   Intercostal muscle pain 09/20/2013   Preventative health care 08/06/2013   GERD (gastroesophageal reflux disease) 08/01/2012   Tachycardia 07/28/2012   Health care maintenance 01/31/2012   Chronic sinusitis 01/31/2012   High risk medication use 08/17/2011   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   History of chicken pox    Hypercalcemia 07/19/2015   Hypertension    Osteoporosis, unspecified    steroid induced   Personal history of other diseases of circulatory system    Rheumatoid arthritis(714.0)    Unspecified deficiency anemia    microcytic   Urticaria     Past Surgical History:  Procedure Laterality Date   NASAL SINUS SURGERY  06/2012   NASAL SINUS SURGERY     2010   OPEN REDUCTION INTERNAL FIXATION (ORIF) METACARPAL Right 07/13/2022   Procedure: OPEN REDUCTION INTERNAL FIXATION METACARPAL RIGHT SMALL FINGER;  Surgeon: Shari Easter, MD;  Location: [facility] OR;  Service: Orthopedics;  Laterality: Right;  regional with iv  sedation    Social History   Tobacco Use   Smoking status: Never   Smokeless tobacco: Never  Vaping Use   Vaping status: Never Used  Substance Use Topics   Alcohol use: No    Alcohol/week: 0.0 standard drinks of alcohol   Drug use: No    Family History  Problem Relation Age of Onset   Scleroderma Mother    Heart disease Mother    Kidney disease Mother    Other Mother    Prostate cancer Father    Diabetes Father    Heart  disease Father        CHF   Arthritis Father        s/p hip replacement   Cancer Father        lung cancer with mets, smoker   Other Sister        Cardiomegaly   Arthritis Sister    Heart disease Sister        rare   Sleep apnea Brother    Heart disease Brother    Arthritis Daughter    Heart disease Son    Hyperlipidemia Son    Lung cancer Paternal Uncle    Liver cancer Maternal Grandmother    Hypertension Maternal Grandmother    Heart attack Maternal Grandmother    Coronary artery disease Paternal Grandmother    Hypertension Paternal Grandmother    Liver cancer Paternal Grandmother    Alzheimer's disease Paternal Grandfather    Lupus Cousin    Arthritis Cousin    Colon cancer Neg Hx    Asthma Neg Hx    Allergic rhinitis Neg Hx    Eczema Neg Hx    Immunodeficiency Neg Hx    Angioedema Neg Hx    Esophageal cancer Neg Hx    Rectal cancer Neg Hx    Stomach cancer Neg Hx    Colon polyps Neg Hx     Allergies  Allergen Reactions   Augmentin  [Amoxicillin -Pot Clavulanate] Hives, Itching and Swelling   Avelox [Moxifloxacin Hcl In Nacl] Diarrhea, Nausea And Vomiting and Other (See Comments)    Dizziness, Cold chills    Bactrim  [Sulfamethoxazole -Trimethoprim ] Hives   Doxycycline      Abdominal discomfort, anorexia   Sulfamethoxazole -Trimethoprim  Hives   Amoxicillin  Other (See Comments)    Medication list has been reviewed and updated.  Current Outpatient Medications on File Prior to Visit  Medication Sig Dispense Refill   albuterol  (VENTOLIN  HFA) 108 (90 Base) MCG/ACT inhaler INHALE ONE TO TWO PUFFS INTO THE LUNGS EVERY 6 HOURS AS NEEDED FOR WHEEZING OR SHORTNESS OF BREATH 18 each 5   atorvastatin  (LIPITOR) 10 MG tablet Take 1 tablet by mouth once daily 90 tablet 0   azithromycin  (ZITHROMAX ) 250 MG tablet Take 2 tabs the first day and then 1 tab daily until you run out. 6 tablet 0   Blood Glucose Monitoring Suppl DEVI 1 each by Does not apply route 3 (three) times daily.  May substitute to any manufacturer covered by patient's insurance. 1 each 0   budesonide -formoterol  (SYMBICORT ) 160-4.5 MCG/ACT inhaler Inhale 2 puffs into the lungs 2 (two) times daily. 10.2 g 12   calcium -vitamin D  (OSCAL WITH D) 500-5 MG-MCG tablet Take 1 tablet by mouth daily with breakfast.     famotidine  (PEPCID ) 40 MG tablet Take 1 tablet (40 mg total) by mouth daily. 90 tablet 1   Ferrous Fumarate -Folic Acid  (HEMATINIC/FOLIC ACID ) 324-1 MG TABS Take 1 tablet by mouth daily. 90 tablet 1  fluticasone  (FLONASE ) 50 MCG/ACT nasal spray Place 2 sprays into both nostrils daily. 16 g 5   gabapentin  (NEURONTIN ) 300 MG capsule Take 1 capsule (300 mg total) by mouth 2 (two) times daily as needed. 180 capsule 1   Glucose Blood (BLOOD GLUCOSE TEST STRIPS) STRP 1 each by Does not apply route 3 (three) times daily. May substitute to any manufacturer covered by patient's insurance. 100 strip 3   HYDROcodone -acetaminophen  (NORCO/VICODIN) 5-325 MG tablet Take 1 tablet by mouth every 6 (six) hours as needed for moderate pain (pain score 4-6) or severe pain (pain score 7-10). 40 tablet 0   levocetirizine (XYZAL ) 5 MG tablet Take 1 tablet (5 mg total) by mouth every evening. 30 tablet 4   meloxicam  (MOBIC ) 7.5 MG tablet TAKE 1 TO 2 TABLETS BY MOUTH ONCE DAILY AS NEEDED FOR PAIN 60 tablet 03   metoprolol  succinate (TOPROL -XL) 25 MG 24 hr tablet Take 1 tablet (25 mg total) by mouth daily. Take with or immediately following a meal 90 tablet 0   montelukast  (SINGULAIR ) 10 MG tablet TAKE 1 TABLET BY MOUTH AT BEDTIME 90 tablet 3   pantoprazole  (PROTONIX ) 20 MG tablet Take 1 tablet (20 mg total) by mouth daily before breakfast. 90 tablet 3   predniSONE  (DELTASONE ) 2.5 MG tablet Take 1 tablet by mouth once daily with breakfast 90 tablet 0   promethazine -dextromethorphan  (PROMETHAZINE -DM) 6.25-15 MG/5ML syrup Take 5 mLs by mouth 4 (four) times daily as needed for cough. 118 mL 0   Pseudoephedrine-Guaifenesin  (MUCINEX  D  MAX STRENGTH) [phone] MG TB12 Take 1 tablet by mouth 2 (two) times daily.     Current Facility-Administered Medications on File Prior to Visit  Medication Dose Route Frequency Provider Last Rate Last Admin   [START ON 04/30/2024] denosumab  (PROLIA ) injection 60 mg  60 mg Subcutaneous Q6 months Domenica Blackbird A, MD       ipratropium-albuterol  (DUONEB) 0.5-2.5 (3) MG/3ML nebulizer solution 3 mL  3 mL Nebulization Once Wendling, Nicholas Paul, DO        Review of Systems:  As per HPI- otherwise negative.   Physical Examination: There were no vitals filed for this visit. There were no vitals filed for this visit. There is no height or weight on file to calculate BMI. Ideal Body Weight:    GEN: no acute distress. HEENT: Atraumatic, Normocephalic.  Ears and Nose: No external deformity. CV: RRR, No M/G/R. No JVD. No thrill. No extra heart sounds. PULM: CTA B, no wheezes, crackles, rhonchi. No retractions. No resp. distress. No accessory muscle use. ABD: S, NT, ND, +BS. No rebound. No HSM. EXTR: No c/c/e PSYCH: Normally interactive. Conversant.    Assessment and Plan: No diagnosis found.  Assessment & Plan   Signed Harlene Schroeder, MD

## 2024-04-15 ENCOUNTER — Encounter: Payer: Self-pay | Admitting: Family Medicine

## 2024-04-15 ENCOUNTER — Ambulatory Visit: Admitting: Family Medicine

## 2024-04-15 VITALS — BP 138/78 | HR 49 | Ht 60.0 in | Wt 103.0 lb

## 2024-04-15 DIAGNOSIS — J208 Acute bronchitis due to other specified organisms: Secondary | ICD-10-CM | POA: Diagnosis not present

## 2024-04-15 DIAGNOSIS — M79661 Pain in right lower leg: Secondary | ICD-10-CM | POA: Diagnosis not present

## 2024-04-15 DIAGNOSIS — J4531 Mild persistent asthma with (acute) exacerbation: Secondary | ICD-10-CM

## 2024-04-15 MED ORDER — AZITHROMYCIN 250 MG PO TABS: ORAL_TABLET | ORAL | 0 refills | Status: AC

## 2024-04-15 MED ORDER — PREDNISONE 20 MG PO TABS
ORAL_TABLET | ORAL | 0 refills | Status: AC
Start: 2024-04-15 — End: ?

## 2024-04-15 MED ORDER — IPRATROPIUM-ALBUTEROL 0.5-2.5 (3) MG/3ML IN SOLN: 3.0000 mL | Freq: Four times a day (QID) | RESPIRATORY_TRACT | 1 refills | Status: AC | PRN

## 2024-04-15 MED ORDER — MELOXICAM 7.5 MG PO TABS: ORAL_TABLET | ORAL | 3 refills | Status: AC

## 2024-04-15 MED ORDER — BUDESONIDE-FORMOTEROL FUMARATE 160-4.5 MCG/ACT IN AERO: 2.0000 | INHALATION_SPRAY | Freq: Two times a day (BID) | RESPIRATORY_TRACT | 12 refills | Status: AC

## 2024-04-15 NOTE — Patient Instructions (Signed)
 Good to see you today- I hope you are feeling much better soon!   Prednisone - take 40 mg daily for 3 days, then can take 20 mg daily for 3 days more if needed.  Pause the prednisone  2.5 while you are on the higher dose  Azithromycin  antibiotic for 5 days Be sure to use your Symbicort  inhaler Duonebs as needed  Let me know if not feeling better in the next few days- Sooner if worse.   Recommend flu shot and covid booster once you are improved Ok to do 2nd dose of shingrix  at any time

## 2024-04-16 ENCOUNTER — Other Ambulatory Visit: Payer: Self-pay | Admitting: Family Medicine

## 2024-04-20 ENCOUNTER — Other Ambulatory Visit: Payer: Self-pay | Admitting: Family Medicine

## 2024-05-07 ENCOUNTER — Ambulatory Visit

## 2024-05-08 ENCOUNTER — Ambulatory Visit: Admitting: Podiatry

## 2024-05-09 ENCOUNTER — Other Ambulatory Visit: Payer: Self-pay | Admitting: Family Medicine

## 2024-05-16 ENCOUNTER — Ambulatory Visit

## 2024-05-16 ENCOUNTER — Other Ambulatory Visit: Payer: Self-pay | Admitting: Family Medicine

## 2024-05-16 DIAGNOSIS — M069 Rheumatoid arthritis, unspecified: Secondary | ICD-10-CM

## 2024-05-16 DIAGNOSIS — M810 Age-related osteoporosis without current pathological fracture: Secondary | ICD-10-CM

## 2024-05-16 MED ORDER — DENOSUMAB 60 MG/ML ~~LOC~~ SOSY: 60.0000 mg | PREFILLED_SYRINGE | SUBCUTANEOUS | Status: AC

## 2024-05-16 NOTE — Telephone Encounter (Signed)
 Requesting: Hydrocodone - APAP 5-325 MG Contract: 01/03/2024 UDS: 01/03/2024 Last Visit: 05/09/2024 Next Visit: 07/01/2024 Last Refill: 02/03/24  Please Advise

## 2024-05-16 NOTE — Progress Notes (Signed)
 Patient here for Prolia  injection per physicians orders  Prolia  60 mg SQ , was administered left arm today. Patient tolerated injection.  Patient next injection due: 6 months, appt made:  No- will schedule in 5 months after benefits are ran again  Initial injection: no  Did Prolia  come from pharmacy (if yes please select patient supplied): no  Cam placed for next injection: yes

## 2024-05-16 NOTE — Telephone Encounter (Signed)
 Pt is asking for hydrocodone  to be sent to the [facility] on [address] in [place].

## 2024-05-17 MED ORDER — HYDROCODONE-ACETAMINOPHEN 5-325 MG PO TABS: 1.0000 | ORAL_TABLET | Freq: Four times a day (QID) | ORAL | 0 refills | Status: AC | PRN

## 2024-07-01 ENCOUNTER — Ambulatory Visit: Admitting: Family Medicine

## 2024-07-03 DIAGNOSIS — M069 Rheumatoid arthritis, unspecified: Secondary | ICD-10-CM | POA: Insufficient documentation

## 2024-07-03 NOTE — Assessment & Plan Note (Signed)
 Follows with Rheumatology- [facility].

## 2024-07-03 NOTE — Progress Notes (Unsigned)
 "  Subjective:     Patient ID: Jamie Cox, female    DOB: 1960/09/01, 63 y.o.   MRN: [id number]  No chief complaint on file.   HPI  Discussed the use of AI scribe software for clinical note transcription with the patient, who gave verbal consent to proceed.  History of Present Illness              Health Maintenance Due  Topic Date Due   Cervical Cancer Screening (HPV/Pap Cotest)  03/05/2019   DTaP/Tdap/Td (2 - Tdap) 05/16/2019   Zoster Vaccines- Shingrix  (2 of 2) 09/01/2023   Influenza Vaccine  01/05/2024   COVID-19 Vaccine (5 - 2025-26 season) 02/05/2024    Past Medical History:  Diagnosis Date   Allergy    Asthma    Atypical chest pain    Chronic sinusitis    Followed by Dr. 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   History of chicken pox    Hypercalcemia 07/19/2015   Hypertension    Osteoporosis, unspecified    steroid induced   Personal history of other diseases of circulatory system    Rheumatoid arthritis(714.0)    Unspecified deficiency anemia    microcytic   Urticaria     Past Surgical History:  Procedure Laterality Date   NASAL SINUS SURGERY  06/2012   NASAL SINUS SURGERY     2010   OPEN REDUCTION INTERNAL FIXATION (ORIF) METACARPAL Right 07/13/2022   Procedure: OPEN REDUCTION INTERNAL FIXATION METACARPAL RIGHT SMALL FINGER;  Surgeon: Shari Easter, MD;  Location: [facility] OR;  Service: Orthopedics;  Laterality: Right;  regional with iv sedation    Family History  Problem Relation Age of Onset   Scleroderma Mother    Heart disease Mother    Kidney disease Mother    Other Mother    Prostate cancer Father    Diabetes Father    Heart disease Father        CHF   Arthritis Father        s/p hip replacement   Cancer Father        lung cancer with mets, smoker   Other Sister        Cardiomegaly   Arthritis Sister    Heart disease Sister        rare   Sleep  apnea Brother    Heart disease Brother    Arthritis Daughter    Heart disease Son    Hyperlipidemia Son    Lung cancer Paternal Uncle    Liver cancer Maternal Grandmother    Hypertension Maternal Grandmother    Heart attack Maternal Grandmother    Coronary artery disease Paternal Grandmother    Hypertension Paternal Grandmother    Liver cancer Paternal Grandmother    Alzheimer's disease Paternal Grandfather    Lupus Cousin    Arthritis Cousin    Colon cancer Neg Hx    Asthma Neg Hx    Allergic rhinitis Neg Hx    Eczema Neg Hx    Immunodeficiency Neg Hx    Angioedema Neg Hx    Esophageal cancer Neg Hx    Rectal cancer Neg Hx    Stomach cancer Neg Hx    Colon polyps Neg Hx     Social History   Socioeconomic History   Marital status: Married    Spouse name: Not on file   Number of children: 2  Years of education: Not on file   Highest education level: Not on file  Occupational History   Occupation: Data processing manager  Tobacco Use   Smoking status: Never   Smokeless tobacco: Never  Vaping Use   Vaping status: Never Used  Substance and Sexual Activity   Alcohol use: No    Alcohol/week: 0.0 standard drinks of alcohol   Drug use: No   Sexual activity: Not Currently  Other Topics Concern   Not on file  Social History Narrative   Not on file   Social Drivers of Health   Tobacco Use: Low Risk (04/15/2024)   Patient History    Smoking Tobacco Use: Never    Smokeless Tobacco Use: Never    Passive Exposure: Not on file  Financial Resource Strain: Not on file  Food Insecurity: Not on file  Transportation Needs: No Transportation Needs (03/10/2023)   PRAPARE - Transportation    Lack of Transportation (Medical): No    Lack of Transportation (Non-Medical): No  Physical Activity: Not on file  Stress: Not on file  Social Connections: Not on file  Intimate Partner Violence: Not on file  Depression (PHQ2-9): Low Risk (01/29/2024)   Depression (PHQ2-9)    PHQ-2 Score: 3   Alcohol Screen: Not on file  Housing: Not on file  Utilities: Not on file  Health Literacy: Not on file    Outpatient Medications Prior to Visit  Medication Sig Dispense Refill   albuterol  (VENTOLIN  HFA) 108 (90 Base) MCG/ACT inhaler INHALE ONE TO TWO PUFFS INTO THE LUNGS EVERY 6 HOURS AS NEEDED FOR WHEEZING OR SHORTNESS OF BREATH 18 each 5   atorvastatin  (LIPITOR) 10 MG tablet Take 1 tablet by mouth once daily 90 tablet 0   azithromycin  (ZITHROMAX ) 250 MG tablet Take 2 tabs the first day and then 1 tab daily until you run out. (Patient not taking: Reported on 04/15/2024) 6 tablet 0   Blood Glucose Monitoring Suppl DEVI 1 each by Does not apply route 3 (three) times daily. May substitute to any manufacturer covered by patient's insurance. 1 each 0   budesonide -formoterol  (SYMBICORT ) 160-4.5 MCG/ACT inhaler Inhale 2 puffs into the lungs 2 (two) times daily. 10.2 g 12   calcium -vitamin D  (OSCAL WITH D) 500-5 MG-MCG tablet Take 1 tablet by mouth daily with breakfast.     famotidine  (PEPCID ) 40 MG tablet Take 1 tablet (40 mg total) by mouth daily. 90 tablet 1   Ferrous Fumarate -Folic Acid  (HEMATINIC/FOLIC ACID ) 324-1 MG TABS Take 1 tablet by mouth daily. 90 tablet 1   fluticasone  (FLONASE ) 50 MCG/ACT nasal spray Place 2 sprays into both nostrils daily. 16 g 5   gabapentin  (NEURONTIN ) 300 MG capsule Take 1 capsule (300 mg total) by mouth 2 (two) times daily as needed. 180 capsule 1   Glucose Blood (BLOOD GLUCOSE TEST STRIPS) STRP 1 each by Does not apply route 3 (three) times daily. May substitute to any manufacturer covered by patient's insurance. 100 strip 3   HYDROcodone -acetaminophen  (NORCO/VICODIN) 5-325 MG tablet Take 1 tablet by mouth every 6 (six) hours as needed for moderate pain (pain score 4-6) or severe pain (pain score 7-10). 40 tablet 0   ipratropium-albuterol  (DUONEB) 0.5-2.5 (3) MG/3ML SOLN Take 3 mLs by nebulization every 6 (six) hours as needed. 360 mL 1   levocetirizine (XYZAL ) 5  MG tablet Take 1 tablet (5 mg total) by mouth every evening. 30 tablet 4   meloxicam  (MOBIC ) 7.5 MG tablet TAKE 1 TO 2 TABLETS BY MOUTH  ONCE DAILY AS NEEDED FOR PAIN 60 tablet 3   metoprolol  succinate (TOPROL -XL) 25 MG 24 hr tablet TAKE 1 TABLET BY MOUTH ONCE DAILY TAKE  WITH  OR  IMMEDIATELY  FOLLOWING  A  MEAL 90 tablet 0   montelukast  (SINGULAIR ) 10 MG tablet TAKE 1 TABLET BY MOUTH AT BEDTIME 90 tablet 3   pantoprazole  (PROTONIX ) 20 MG tablet Take 1 tablet (20 mg total) by mouth daily before breakfast. 90 tablet 3   predniSONE  (DELTASONE ) 2.5 MG tablet Take 1 tablet by mouth once daily with breakfast 90 tablet 0   predniSONE  (DELTASONE ) 20 MG tablet Take 40 mg by mouth daily for 3 days, then 20 mg by mouth daily for 3 days if needed 9 tablet 0   promethazine -dextromethorphan  (PROMETHAZINE -DM) 6.25-15 MG/5ML syrup Take 5 mLs by mouth 4 (four) times daily as needed for cough. 118 mL 0   Pseudoephedrine-Guaifenesin  (MUCINEX  D MAX STRENGTH) [phone] MG TB12 Take 1 tablet by mouth 2 (two) times daily.     Facility-Administered Medications Prior to Visit  Medication Dose Route Frequency Provider Last Rate Last Admin   [START ON 11/12/2024] denosumab  (PROLIA ) injection 60 mg  60 mg Subcutaneous Q6 months Domenica Harlene LABOR, MD       ipratropium-albuterol  (DUONEB) 0.5-2.5 (3) MG/3ML nebulizer solution 3 mL  3 mL Nebulization Once Wendling, Nicholas Paul, DO        Allergies[1]  ROS     Objective:    Physical Exam   There were no vitals taken for this visit. Wt Readings from Last 3 Encounters:  04/15/24 103 lb (46.7 kg)  03/01/24 101 lb 12.8 oz (46.2 kg)  01/29/24 107 lb 3.2 oz (48.6 kg)       Assessment & Plan:   Problem List Items Addressed This Visit       Cardiovascular and Mediastinum   Hypertension     Respiratory   Asthma, chronic obstructive, with acute exacerbation (HCC) - Primary   Rheumatoid lung disease (HCC)     Digestive   GERD (gastroesophageal reflux disease)      Endocrine   Controlled type 2 diabetes mellitus without complication, without long-term current use of insulin  (HCC)     Musculoskeletal and Integument   Osteoporosis   Rheumatoid arthritis involving multiple sites (HCC)     Other   Chronic cough   Vitamin D  deficiency   Fu 6 month CPE   I am having Chandi L. Scotti maintain her calcium -vitamin D , Pseudoephedrine-Guaifenesin , gabapentin , albuterol , fluticasone , levocetirizine, pantoprazole , Blood Glucose Monitoring Suppl, BLOOD GLUCOSE TEST STRIPS, montelukast , promethazine -dextromethorphan , azithromycin , predniSONE , ipratropium-albuterol , predniSONE , budesonide -formoterol , meloxicam , metoprolol  succinate, atorvastatin , Hematinic/Folic Acid , famotidine , and HYDROcodone -acetaminophen . We will continue to administer ipratropium-albuterol  and denosumab .  No orders of the defined types were placed in this encounter.     [1]  Allergies Allergen Reactions   Augmentin  [Amoxicillin -Pot Clavulanate] Hives, Itching and Swelling   Avelox [Moxifloxacin Hcl In Nacl] Diarrhea, Nausea And Vomiting and Other (See Comments)    Dizziness, Cold chills    Bactrim  [Sulfamethoxazole -Trimethoprim ] Hives   Doxycycline      Abdominal discomfort, anorexia   Sulfamethoxazole -Trimethoprim  Hives   Amoxicillin  Other (See Comments)   "

## 2024-07-03 NOTE — Assessment & Plan Note (Signed)
 Following with pulmonology

## 2024-07-03 NOTE — Assessment & Plan Note (Signed)
 Supplement and monitor

## 2024-07-05 ENCOUNTER — Ambulatory Visit: Admitting: Student

## 2024-07-05 DIAGNOSIS — M069 Rheumatoid arthritis, unspecified: Secondary | ICD-10-CM

## 2024-07-05 DIAGNOSIS — R053 Chronic cough: Secondary | ICD-10-CM

## 2024-07-05 DIAGNOSIS — M0510 Rheumatoid lung disease with rheumatoid arthritis of unspecified site: Secondary | ICD-10-CM

## 2024-07-05 DIAGNOSIS — I10 Essential (primary) hypertension: Secondary | ICD-10-CM

## 2024-07-05 DIAGNOSIS — J441 Chronic obstructive pulmonary disease with (acute) exacerbation: Secondary | ICD-10-CM

## 2024-07-05 DIAGNOSIS — E119 Type 2 diabetes mellitus without complications: Secondary | ICD-10-CM

## 2024-07-05 DIAGNOSIS — E559 Vitamin D deficiency, unspecified: Secondary | ICD-10-CM

## 2024-07-05 DIAGNOSIS — M810 Age-related osteoporosis without current pathological fracture: Secondary | ICD-10-CM

## 2024-07-05 DIAGNOSIS — K219 Gastro-esophageal reflux disease without esophagitis: Secondary | ICD-10-CM

## 2024-07-09 ENCOUNTER — Ambulatory Visit: Admitting: Podiatry

## 2024-07-09 ENCOUNTER — Encounter: Payer: Self-pay | Admitting: Podiatry

## 2024-07-09 DIAGNOSIS — L84 Corns and callosities: Secondary | ICD-10-CM

## 2024-07-09 DIAGNOSIS — Q828 Other specified congenital malformations of skin: Secondary | ICD-10-CM

## 2024-07-09 DIAGNOSIS — B351 Tinea unguium: Secondary | ICD-10-CM

## 2024-07-09 DIAGNOSIS — E119 Type 2 diabetes mellitus without complications: Secondary | ICD-10-CM

## 2024-07-10 ENCOUNTER — Ambulatory Visit: Admitting: Family Medicine

## 2024-07-10 ENCOUNTER — Telehealth: Payer: Self-pay

## 2024-07-10 ENCOUNTER — Inpatient Hospital Stay

## 2024-07-10 ENCOUNTER — Inpatient Hospital Stay: Admitting: Physician Assistant

## 2024-07-10 ENCOUNTER — Encounter: Payer: Self-pay | Admitting: Podiatry

## 2024-07-10 NOTE — Telephone Encounter (Signed)
 Spoke with patient in regards to appts today.  Rescheduled patient on Thursday, 07/18/24 for labs at 0930 AM and visit with Cassie, PA at 10 AM.  She voiced understanding.

## 2024-07-15 ENCOUNTER — Ambulatory Visit: Admitting: Family Medicine

## 2024-07-18 ENCOUNTER — Inpatient Hospital Stay

## 2024-07-18 ENCOUNTER — Inpatient Hospital Stay: Admitting: Physician Assistant

## 2024-10-09 ENCOUNTER — Ambulatory Visit: Admitting: Podiatry
